# Patient Record
Sex: Female | Born: 1940 | Race: Black or African American | Hispanic: No | Marital: Single | Smoking: Never smoker
Health system: Southern US, Community
[De-identification: ages and names within clinical notes are randomized; demographics above are authoritative.]

## PROBLEM LIST (undated history)

## (undated) DIAGNOSIS — I10 Essential (primary) hypertension: Secondary | ICD-10-CM

## (undated) DIAGNOSIS — E079 Disorder of thyroid, unspecified: Secondary | ICD-10-CM

## (undated) DIAGNOSIS — R011 Cardiac murmur, unspecified: Secondary | ICD-10-CM

## (undated) DIAGNOSIS — E119 Type 2 diabetes mellitus without complications: Secondary | ICD-10-CM

## (undated) DIAGNOSIS — I2510 Atherosclerotic heart disease of native coronary artery without angina pectoris: Secondary | ICD-10-CM

## (undated) DIAGNOSIS — F329 Major depressive disorder, single episode, unspecified: Secondary | ICD-10-CM

## (undated) DIAGNOSIS — I509 Heart failure, unspecified: Secondary | ICD-10-CM

## (undated) DIAGNOSIS — F419 Anxiety disorder, unspecified: Secondary | ICD-10-CM

## (undated) DIAGNOSIS — G4730 Sleep apnea, unspecified: Secondary | ICD-10-CM

## (undated) DIAGNOSIS — E785 Hyperlipidemia, unspecified: Secondary | ICD-10-CM

## (undated) DIAGNOSIS — F32A Depression, unspecified: Secondary | ICD-10-CM

## (undated) DIAGNOSIS — T7840XA Allergy, unspecified, initial encounter: Secondary | ICD-10-CM

## (undated) DIAGNOSIS — K219 Gastro-esophageal reflux disease without esophagitis: Secondary | ICD-10-CM

## (undated) DIAGNOSIS — E039 Hypothyroidism, unspecified: Secondary | ICD-10-CM

## (undated) DIAGNOSIS — K635 Polyp of colon: Secondary | ICD-10-CM

## (undated) HISTORY — DX: Major depressive disorder, single episode, unspecified: F32.9

## (undated) HISTORY — DX: Hyperlipidemia, unspecified: E78.5

## (undated) HISTORY — DX: Polyp of colon: K63.5

## (undated) HISTORY — PX: CERVICAL FUSION: SHX112

## (undated) HISTORY — PX: ABDOMINAL HYSTERECTOMY: SHX81

## (undated) HISTORY — DX: Gastro-esophageal reflux disease without esophagitis: K21.9

## (undated) HISTORY — DX: Allergy, unspecified, initial encounter: T78.40XA

## (undated) HISTORY — DX: Depression, unspecified: F32.A

## (undated) HISTORY — DX: Anxiety disorder, unspecified: F41.9

## (undated) HISTORY — PX: BACK SURGERY: SHX140

## (undated) HISTORY — DX: Type 2 diabetes mellitus without complications: E11.9

## (undated) HISTORY — PX: NECK SURGERY: SHX720

## (undated) HISTORY — PX: THYROID SURGERY: SHX805

## (undated) HISTORY — DX: Heart failure, unspecified: I50.9

## (undated) HISTORY — DX: Sleep apnea, unspecified: G47.30

---

## 2003-06-16 ENCOUNTER — Other Ambulatory Visit: Payer: Self-pay

## 2004-01-01 ENCOUNTER — Ambulatory Visit: Payer: Self-pay | Admitting: Internal Medicine

## 2004-01-19 ENCOUNTER — Emergency Department: Payer: Self-pay | Admitting: Emergency Medicine

## 2004-01-24 ENCOUNTER — Ambulatory Visit: Payer: Self-pay | Admitting: Surgery

## 2004-01-31 ENCOUNTER — Ambulatory Visit: Payer: Self-pay | Admitting: Surgery

## 2004-02-27 ENCOUNTER — Ambulatory Visit: Payer: Self-pay | Admitting: Internal Medicine

## 2004-04-09 ENCOUNTER — Ambulatory Visit: Payer: Self-pay | Admitting: Otolaryngology

## 2004-06-28 ENCOUNTER — Emergency Department: Payer: Self-pay | Admitting: General Practice

## 2004-07-16 ENCOUNTER — Emergency Department: Payer: Self-pay | Admitting: General Practice

## 2004-07-17 ENCOUNTER — Ambulatory Visit: Payer: Self-pay | Admitting: Internal Medicine

## 2004-07-25 ENCOUNTER — Ambulatory Visit: Payer: Self-pay | Admitting: Internal Medicine

## 2004-09-29 ENCOUNTER — Ambulatory Visit: Payer: Self-pay | Admitting: Internal Medicine

## 2004-10-31 ENCOUNTER — Inpatient Hospital Stay: Payer: Self-pay | Admitting: Internal Medicine

## 2004-10-31 ENCOUNTER — Other Ambulatory Visit: Payer: Self-pay

## 2005-03-05 ENCOUNTER — Inpatient Hospital Stay: Payer: Self-pay | Admitting: Internal Medicine

## 2005-03-05 ENCOUNTER — Other Ambulatory Visit: Payer: Self-pay

## 2005-03-12 ENCOUNTER — Ambulatory Visit: Payer: Self-pay | Admitting: Internal Medicine

## 2005-03-27 ENCOUNTER — Ambulatory Visit: Payer: Self-pay | Admitting: Internal Medicine

## 2005-09-09 ENCOUNTER — Ambulatory Visit: Payer: Self-pay | Admitting: Gastroenterology

## 2005-10-13 ENCOUNTER — Ambulatory Visit: Payer: Self-pay | Admitting: Gastroenterology

## 2005-11-12 ENCOUNTER — Emergency Department: Payer: Self-pay | Admitting: Emergency Medicine

## 2005-11-16 ENCOUNTER — Other Ambulatory Visit: Payer: Self-pay

## 2005-11-16 ENCOUNTER — Emergency Department: Payer: Self-pay | Admitting: Emergency Medicine

## 2005-12-03 ENCOUNTER — Ambulatory Visit: Payer: Self-pay | Admitting: Internal Medicine

## 2006-06-20 ENCOUNTER — Emergency Department: Payer: Self-pay | Admitting: Emergency Medicine

## 2006-07-02 ENCOUNTER — Emergency Department: Payer: Self-pay | Admitting: Emergency Medicine

## 2006-10-05 ENCOUNTER — Emergency Department: Payer: Self-pay | Admitting: Emergency Medicine

## 2006-10-05 ENCOUNTER — Other Ambulatory Visit: Payer: Self-pay

## 2006-11-02 ENCOUNTER — Ambulatory Visit: Payer: Self-pay | Admitting: Internal Medicine

## 2006-11-16 ENCOUNTER — Emergency Department: Payer: Self-pay | Admitting: Emergency Medicine

## 2006-11-16 ENCOUNTER — Other Ambulatory Visit: Payer: Self-pay

## 2007-03-15 ENCOUNTER — Ambulatory Visit: Payer: Self-pay | Admitting: Internal Medicine

## 2007-06-09 ENCOUNTER — Emergency Department: Payer: Self-pay | Admitting: Emergency Medicine

## 2007-06-09 ENCOUNTER — Other Ambulatory Visit: Payer: Self-pay

## 2007-07-09 ENCOUNTER — Emergency Department: Payer: Self-pay | Admitting: Emergency Medicine

## 2007-07-29 ENCOUNTER — Ambulatory Visit: Payer: Self-pay | Admitting: Internal Medicine

## 2007-08-19 ENCOUNTER — Ambulatory Visit: Payer: Self-pay | Admitting: Internal Medicine

## 2007-08-29 ENCOUNTER — Ambulatory Visit: Payer: Self-pay | Admitting: Internal Medicine

## 2007-10-03 ENCOUNTER — Ambulatory Visit: Payer: Self-pay | Admitting: Internal Medicine

## 2007-10-14 ENCOUNTER — Emergency Department: Payer: Self-pay | Admitting: Emergency Medicine

## 2007-11-16 ENCOUNTER — Ambulatory Visit: Payer: Self-pay | Admitting: Internal Medicine

## 2007-12-15 ENCOUNTER — Ambulatory Visit: Payer: Self-pay | Admitting: Internal Medicine

## 2008-02-15 ENCOUNTER — Ambulatory Visit: Payer: Self-pay | Admitting: Internal Medicine

## 2008-03-09 ENCOUNTER — Emergency Department: Payer: Self-pay | Admitting: Emergency Medicine

## 2008-03-15 ENCOUNTER — Ambulatory Visit: Payer: Self-pay | Admitting: Internal Medicine

## 2008-04-18 ENCOUNTER — Ambulatory Visit: Payer: Self-pay | Admitting: Internal Medicine

## 2008-05-14 ENCOUNTER — Ambulatory Visit: Payer: Self-pay | Admitting: General Surgery

## 2008-06-05 ENCOUNTER — Emergency Department: Payer: Self-pay | Admitting: Emergency Medicine

## 2008-06-10 ENCOUNTER — Emergency Department: Payer: Self-pay | Admitting: Emergency Medicine

## 2008-06-11 ENCOUNTER — Ambulatory Visit: Payer: Self-pay | Admitting: Oncology

## 2008-06-14 LAB — CBC WITH DIFFERENTIAL (CANCER CENTER ONLY)
BASO#: 0.1 10*3/uL (ref 0.0–0.2)
BASO%: 1.3 % (ref 0.0–2.0)
EOS%: 2.3 % (ref 0.0–7.0)
Eosinophils Absolute: 0.1 10*3/uL (ref 0.0–0.5)
HCT: 34.8 % (ref 34.8–46.6)
HGB: 11.4 g/dL — ABNORMAL LOW (ref 11.6–15.9)
LYMPH#: 2.5 10*3/uL (ref 0.9–3.3)
LYMPH%: 46.6 % (ref 14.0–48.0)
MCH: 25.1 pg — ABNORMAL LOW (ref 26.0–34.0)
MCHC: 32.8 g/dL (ref 32.0–36.0)
MCV: 76 fL — ABNORMAL LOW (ref 81–101)
MONO#: 0.3 10*3/uL (ref 0.1–0.9)
MONO%: 6.2 % (ref 0.0–13.0)
NEUT#: 2.3 10*3/uL (ref 1.5–6.5)
NEUT%: 43.6 % (ref 39.6–80.0)
Platelets: 223 10*3/uL (ref 145–400)
RBC: 4.55 10*6/uL (ref 3.70–5.32)
RDW: 13.6 % (ref 10.5–14.6)
WBC: 5.3 10*3/uL (ref 3.9–10.0)

## 2008-06-14 LAB — CMP (CANCER CENTER ONLY)
ALT(SGPT): 30 U/L (ref 10–47)
AST: 33 U/L (ref 11–38)
Albumin: 3.5 g/dL (ref 3.3–5.5)
Alkaline Phosphatase: 114 U/L — ABNORMAL HIGH (ref 26–84)
BUN, Bld: 20 mg/dL (ref 7–22)
CO2: 30 mEq/L (ref 18–33)
Calcium: 9.2 mg/dL (ref 8.0–10.3)
Chloride: 98 mEq/L (ref 98–108)
Creat: 1.2 mg/dl (ref 0.6–1.2)
Glucose, Bld: 91 mg/dL (ref 73–118)
Potassium: 3.4 mEq/L (ref 3.3–4.7)
Sodium: 137 mEq/L (ref 128–145)
Total Bilirubin: 0.6 mg/dl (ref 0.20–1.60)
Total Protein: 7.8 g/dL (ref 6.4–8.1)

## 2008-06-14 LAB — LACTATE DEHYDROGENASE: LDH: 186 U/L (ref 94–250)

## 2008-06-19 LAB — RETICULOCYTES (CHCC)
ABS Retic: 67.5 10*3/uL (ref 19.0–186.0)
RBC.: 4.22 MIL/uL (ref 3.87–5.11)
Retic Ct Pct: 1.6 % (ref 0.4–3.1)

## 2008-06-19 LAB — PROTEIN ELECTROPHORESIS, SERUM
Albumin ELP: 55.2 % — ABNORMAL LOW (ref 55.8–66.1)
Alpha-1-Globulin: 4.7 % (ref 2.9–4.9)
Alpha-2-Globulin: 9.4 % (ref 7.1–11.8)
Beta 2: 6.9 % — ABNORMAL HIGH (ref 3.2–6.5)
Beta Globulin: 6 % (ref 4.7–7.2)
Gamma Globulin: 17.8 % (ref 11.1–18.8)
Total Protein, Serum Electrophoresis: 7.2 g/dL (ref 6.0–8.3)

## 2008-06-19 LAB — FERRITIN: Ferritin: 225 ng/mL (ref 10–291)

## 2008-06-19 LAB — HEMOGLOBINOPATHY EVALUATION
Hemoglobin Other: 0 % (ref 0.0–0.0)
Hgb A2 Quant: 2.4 % (ref 2.2–3.2)
Hgb A: 97.6 % (ref 96.8–97.8)
Hgb F Quant: 0 % (ref 0.0–2.0)
Hgb S Quant: 0 % (ref 0.0–0.0)

## 2008-06-19 LAB — IRON AND TIBC
%SAT: 42 % (ref 20–55)
Iron: 115 ug/dL (ref 42–145)
TIBC: 277 ug/dL (ref 250–470)
UIBC: 162 ug/dL

## 2008-06-19 LAB — FOLATE: Folate: >20 ng/mL

## 2008-06-19 LAB — VITAMIN B12: Vitamin B-12: 454 pg/mL (ref 211–911)

## 2008-07-31 ENCOUNTER — Ambulatory Visit: Payer: Self-pay | Admitting: Oncology

## 2008-08-02 LAB — CBC WITH DIFFERENTIAL (CANCER CENTER ONLY)
HCT: 34.6 % — ABNORMAL LOW (ref 34.8–46.6)
HGB: 11.6 g/dL (ref 11.6–15.9)
MCH: 25.4 pg — ABNORMAL LOW (ref 26.0–34.0)
MCHC: 33.4 g/dL (ref 32.0–36.0)
MCV: 76 fL — ABNORMAL LOW (ref 81–101)
Platelets: 172 10*3/uL (ref 145–400)
RBC: 4.56 10*6/uL (ref 3.70–5.32)
RDW: 13.8 % (ref 10.5–14.6)
WBC: 5.1 10*3/uL (ref 3.9–10.0)

## 2008-08-02 LAB — MANUAL DIFFERENTIAL (CHCC SATELLITE)
ALC: 3.1 10*3/uL — ABNORMAL HIGH (ref 0.6–2.2)
ANC (CHCC HP manual diff): 1.5 10*3/uL (ref 1.5–6.7)
Band Neutrophils: 2 % (ref 0–10)
Blasts: 2 % — ABNORMAL HIGH (ref 0–0)
Eos: 3 % (ref 0–7)
LYMPH: 55 % — ABNORMAL HIGH (ref 14–48)
MONO: 4 % (ref 0–13)
PLT EST ~~LOC~~: ADEQUATE
SEG: 28 % — ABNORMAL LOW (ref 40–75)
Variant Lymph: 6 % — ABNORMAL HIGH (ref 0–0)

## 2008-09-05 ENCOUNTER — Ambulatory Visit: Payer: Self-pay | Admitting: Gastroenterology

## 2008-10-30 ENCOUNTER — Encounter: Payer: Self-pay | Admitting: Internal Medicine

## 2008-11-28 ENCOUNTER — Encounter: Payer: Self-pay | Admitting: Internal Medicine

## 2008-12-12 ENCOUNTER — Emergency Department: Payer: Self-pay | Admitting: Emergency Medicine

## 2008-12-31 ENCOUNTER — Observation Stay: Payer: Self-pay | Admitting: Cardiovascular Disease

## 2009-01-21 ENCOUNTER — Emergency Department: Payer: Self-pay | Admitting: Emergency Medicine

## 2009-02-24 ENCOUNTER — Emergency Department: Payer: Self-pay | Admitting: Internal Medicine

## 2009-03-20 ENCOUNTER — Ambulatory Visit: Payer: Self-pay | Admitting: Internal Medicine

## 2009-03-25 ENCOUNTER — Ambulatory Visit: Payer: Self-pay | Admitting: Internal Medicine

## 2009-03-30 ENCOUNTER — Emergency Department: Payer: Self-pay | Admitting: Internal Medicine

## 2009-04-16 ENCOUNTER — Ambulatory Visit: Payer: Self-pay | Admitting: Cardiovascular Disease

## 2009-07-31 ENCOUNTER — Encounter: Payer: Self-pay | Admitting: Neurosurgery

## 2009-08-27 ENCOUNTER — Ambulatory Visit: Payer: Self-pay | Admitting: Internal Medicine

## 2009-08-28 ENCOUNTER — Encounter: Payer: Self-pay | Admitting: Neurosurgery

## 2009-09-10 ENCOUNTER — Observation Stay: Payer: Self-pay | Admitting: Internal Medicine

## 2009-10-08 ENCOUNTER — Ambulatory Visit: Payer: Self-pay

## 2010-01-22 ENCOUNTER — Ambulatory Visit: Payer: Self-pay | Admitting: Cardiovascular Disease

## 2010-03-25 ENCOUNTER — Ambulatory Visit: Payer: Self-pay | Admitting: Internal Medicine

## 2010-05-29 ENCOUNTER — Emergency Department: Payer: Self-pay | Admitting: Internal Medicine

## 2010-09-18 ENCOUNTER — Ambulatory Visit: Payer: Self-pay | Admitting: Internal Medicine

## 2010-10-15 ENCOUNTER — Emergency Department: Payer: Self-pay | Admitting: Emergency Medicine

## 2010-10-16 ENCOUNTER — Emergency Department: Payer: Self-pay | Admitting: Emergency Medicine

## 2010-11-18 ENCOUNTER — Emergency Department: Payer: Self-pay | Admitting: Emergency Medicine

## 2011-05-21 ENCOUNTER — Emergency Department: Payer: Self-pay | Admitting: Emergency Medicine

## 2011-06-05 ENCOUNTER — Ambulatory Visit: Payer: Self-pay | Admitting: Internal Medicine

## 2011-09-11 ENCOUNTER — Emergency Department: Payer: Self-pay | Admitting: Emergency Medicine

## 2011-11-07 ENCOUNTER — Ambulatory Visit: Payer: Self-pay | Admitting: Specialist

## 2012-02-09 ENCOUNTER — Ambulatory Visit: Payer: Self-pay | Admitting: Physician Assistant

## 2012-06-17 ENCOUNTER — Ambulatory Visit: Payer: Self-pay | Admitting: Physician Assistant

## 2012-07-17 ENCOUNTER — Emergency Department: Payer: Self-pay | Admitting: Unknown Physician Specialty

## 2012-07-17 LAB — BASIC METABOLIC PANEL
Anion Gap: 6 — ABNORMAL LOW (ref 7–16)
BUN: 11 mg/dL (ref 7–18)
Calcium, Total: 8.9 mg/dL (ref 8.5–10.1)
Chloride: 105 mmol/L (ref 98–107)
Co2: 29 mmol/L (ref 21–32)
Creatinine: 0.74 mg/dL (ref 0.60–1.30)
EGFR (African American): >60
EGFR (Non-African Amer.): >60
Glucose: 119 mg/dL — ABNORMAL HIGH (ref 65–99)
Osmolality: 280 (ref 275–301)
Potassium: 3.5 mmol/L (ref 3.5–5.1)
Sodium: 140 mmol/L (ref 136–145)

## 2012-07-17 LAB — HEPATIC FUNCTION PANEL A (ARMC)
Albumin: 3.5 g/dL (ref 3.4–5.0)
Alkaline Phosphatase: 205 U/L — ABNORMAL HIGH (ref 50–136)
Bilirubin, Direct: <0.1 mg/dL (ref 0.00–0.20)
Bilirubin,Total: 0.3 mg/dL (ref 0.2–1.0)
SGOT(AST): 107 U/L — ABNORMAL HIGH (ref 15–37)
SGPT (ALT): 139 U/L — ABNORMAL HIGH (ref 12–78)
Total Protein: 7.7 g/dL (ref 6.4–8.2)

## 2012-07-17 LAB — CK TOTAL AND CKMB (NOT AT ARMC)
CK, Total: 128 U/L (ref 21–215)
CK-MB: 1.8 ng/mL (ref 0.5–3.6)

## 2012-07-17 LAB — CBC
HCT: 34.8 % — ABNORMAL LOW (ref 35.0–47.0)
HGB: 11.3 g/dL — ABNORMAL LOW (ref 12.0–16.0)
MCH: 25.2 pg — ABNORMAL LOW (ref 26.0–34.0)
MCHC: 32.5 g/dL (ref 32.0–36.0)
MCV: 77 fL — ABNORMAL LOW (ref 80–100)
Platelet: 186 10*3/uL (ref 150–440)
RBC: 4.49 10*6/uL (ref 3.80–5.20)
RDW: 15.2 % — ABNORMAL HIGH (ref 11.5–14.5)
WBC: 7.3 10*3/uL (ref 3.6–11.0)

## 2012-07-17 LAB — LIPASE, BLOOD: Lipase: 99 U/L (ref 73–393)

## 2012-07-17 LAB — TROPONIN I: Troponin-I: <0.02 ng/mL

## 2012-10-27 ENCOUNTER — Ambulatory Visit: Payer: Self-pay | Admitting: Oncology

## 2012-10-27 LAB — CBC CANCER CENTER
Basophil #: 0 x10 3/mm (ref 0.0–0.1)
Basophil %: 0.6 %
Eosinophil #: 0.2 x10 3/mm (ref 0.0–0.7)
Eosinophil %: 3.4 %
HCT: 33.4 % — ABNORMAL LOW (ref 35.0–47.0)
HGB: 11 g/dL — ABNORMAL LOW (ref 12.0–16.0)
Lymphocyte %: 50.5 %
Lymphs Abs: 2.9 x10 3/mm (ref 1.0–3.6)
MCH: 25.6 pg — ABNORMAL LOW (ref 26.0–34.0)
MCHC: 32.9 g/dL (ref 32.0–36.0)
MCV: 78 fL — ABNORMAL LOW (ref 80–100)
Monocyte #: 0.4 x10 3/mm (ref 0.2–0.9)
Monocyte %: 7.7 %
Neutrophil #: 2.2 x10 3/mm (ref 1.4–6.5)
Neutrophil %: 37.8 %
Platelet: 197 x10 3/mm (ref 150–440)
RBC: 4.3 10*6/uL (ref 3.80–5.20)
RDW: 15 % — ABNORMAL HIGH (ref 11.5–14.5)
WBC: 5.7 x10 3/mm (ref 3.6–11.0)

## 2012-10-27 LAB — LACTATE DEHYDROGENASE: LDH: 207 U/L (ref 81–246)

## 2012-10-28 ENCOUNTER — Ambulatory Visit: Payer: Self-pay | Admitting: Oncology

## 2012-10-31 LAB — PROT IMMUNOELECTROPHORES(ARMC)

## 2012-12-02 ENCOUNTER — Ambulatory Visit: Payer: Self-pay | Admitting: Oncology

## 2012-12-14 ENCOUNTER — Ambulatory Visit: Payer: Self-pay | Admitting: Physician Assistant

## 2013-01-12 ENCOUNTER — Emergency Department: Payer: Self-pay | Admitting: Emergency Medicine

## 2013-03-01 ENCOUNTER — Ambulatory Visit: Payer: Self-pay | Admitting: Physician Assistant

## 2013-03-30 DIAGNOSIS — M5412 Radiculopathy, cervical region: Secondary | ICD-10-CM

## 2013-03-30 DIAGNOSIS — M5030 Other cervical disc degeneration, unspecified cervical region: Secondary | ICD-10-CM

## 2013-03-30 HISTORY — DX: Radiculopathy, cervical region: M54.12

## 2013-03-30 HISTORY — DX: Other cervical disc degeneration, unspecified cervical region: M50.30

## 2013-04-10 ENCOUNTER — Ambulatory Visit: Payer: Self-pay | Admitting: Orthopedic Surgery

## 2013-06-22 ENCOUNTER — Ambulatory Visit: Payer: Self-pay | Admitting: Physician Assistant

## 2013-07-20 ENCOUNTER — Ambulatory Visit: Payer: Self-pay | Admitting: Physician Assistant

## 2013-07-29 ENCOUNTER — Emergency Department: Payer: Self-pay | Admitting: Emergency Medicine

## 2013-12-14 DIAGNOSIS — M25512 Pain in left shoulder: Secondary | ICD-10-CM | POA: Insufficient documentation

## 2013-12-14 DIAGNOSIS — M5030 Other cervical disc degeneration, unspecified cervical region: Secondary | ICD-10-CM | POA: Insufficient documentation

## 2013-12-14 DIAGNOSIS — M5412 Radiculopathy, cervical region: Secondary | ICD-10-CM | POA: Insufficient documentation

## 2013-12-22 ENCOUNTER — Ambulatory Visit: Payer: Self-pay | Admitting: Physical Medicine and Rehabilitation

## 2014-04-10 DIAGNOSIS — E119 Type 2 diabetes mellitus without complications: Secondary | ICD-10-CM | POA: Diagnosis not present

## 2014-04-10 DIAGNOSIS — K219 Gastro-esophageal reflux disease without esophagitis: Secondary | ICD-10-CM | POA: Diagnosis not present

## 2014-04-10 DIAGNOSIS — D509 Iron deficiency anemia, unspecified: Secondary | ICD-10-CM | POA: Diagnosis not present

## 2014-04-10 DIAGNOSIS — D649 Anemia, unspecified: Secondary | ICD-10-CM | POA: Diagnosis not present

## 2014-04-10 DIAGNOSIS — E039 Hypothyroidism, unspecified: Secondary | ICD-10-CM | POA: Diagnosis not present

## 2014-04-10 DIAGNOSIS — R109 Unspecified abdominal pain: Secondary | ICD-10-CM | POA: Diagnosis not present

## 2014-04-10 DIAGNOSIS — E559 Vitamin D deficiency, unspecified: Secondary | ICD-10-CM | POA: Diagnosis not present

## 2014-04-10 DIAGNOSIS — I10 Essential (primary) hypertension: Secondary | ICD-10-CM | POA: Diagnosis not present

## 2014-04-10 DIAGNOSIS — K5900 Constipation, unspecified: Secondary | ICD-10-CM | POA: Diagnosis not present

## 2014-04-11 ENCOUNTER — Other Ambulatory Visit: Payer: Self-pay | Admitting: Neurosurgery

## 2014-04-11 DIAGNOSIS — M5416 Radiculopathy, lumbar region: Secondary | ICD-10-CM

## 2014-04-17 DIAGNOSIS — E039 Hypothyroidism, unspecified: Secondary | ICD-10-CM | POA: Diagnosis not present

## 2014-04-17 DIAGNOSIS — K219 Gastro-esophageal reflux disease without esophagitis: Secondary | ICD-10-CM | POA: Diagnosis not present

## 2014-04-17 DIAGNOSIS — D509 Iron deficiency anemia, unspecified: Secondary | ICD-10-CM | POA: Diagnosis not present

## 2014-04-17 DIAGNOSIS — E2839 Other primary ovarian failure: Secondary | ICD-10-CM | POA: Diagnosis not present

## 2014-04-17 DIAGNOSIS — I10 Essential (primary) hypertension: Secondary | ICD-10-CM | POA: Diagnosis not present

## 2014-04-24 ENCOUNTER — Other Ambulatory Visit: Payer: Self-pay

## 2014-04-24 DIAGNOSIS — E870 Hyperosmolality and hypernatremia: Secondary | ICD-10-CM | POA: Diagnosis not present

## 2014-05-02 DIAGNOSIS — G4733 Obstructive sleep apnea (adult) (pediatric): Secondary | ICD-10-CM | POA: Diagnosis not present

## 2014-05-10 DIAGNOSIS — G4733 Obstructive sleep apnea (adult) (pediatric): Secondary | ICD-10-CM | POA: Diagnosis not present

## 2014-05-15 DIAGNOSIS — G4733 Obstructive sleep apnea (adult) (pediatric): Secondary | ICD-10-CM | POA: Diagnosis not present

## 2014-05-18 ENCOUNTER — Emergency Department: Payer: Self-pay | Admitting: Emergency Medicine

## 2014-05-18 DIAGNOSIS — Z79899 Other long term (current) drug therapy: Secondary | ICD-10-CM | POA: Diagnosis not present

## 2014-05-18 DIAGNOSIS — J9811 Atelectasis: Secondary | ICD-10-CM | POA: Diagnosis not present

## 2014-05-18 DIAGNOSIS — K219 Gastro-esophageal reflux disease without esophagitis: Secondary | ICD-10-CM | POA: Diagnosis not present

## 2014-05-18 DIAGNOSIS — Z7951 Long term (current) use of inhaled steroids: Secondary | ICD-10-CM | POA: Diagnosis not present

## 2014-05-18 DIAGNOSIS — I10 Essential (primary) hypertension: Secondary | ICD-10-CM | POA: Diagnosis not present

## 2014-05-22 DIAGNOSIS — I10 Essential (primary) hypertension: Secondary | ICD-10-CM | POA: Diagnosis not present

## 2014-05-22 DIAGNOSIS — F330 Major depressive disorder, recurrent, mild: Secondary | ICD-10-CM | POA: Diagnosis not present

## 2014-05-22 DIAGNOSIS — K219 Gastro-esophageal reflux disease without esophagitis: Secondary | ICD-10-CM | POA: Diagnosis not present

## 2014-05-22 DIAGNOSIS — R10816 Epigastric abdominal tenderness: Secondary | ICD-10-CM | POA: Diagnosis not present

## 2014-06-04 DIAGNOSIS — D123 Benign neoplasm of transverse colon: Secondary | ICD-10-CM | POA: Diagnosis not present

## 2014-06-05 ENCOUNTER — Ambulatory Visit: Payer: Self-pay | Admitting: Gastroenterology

## 2014-06-05 DIAGNOSIS — Z86010 Personal history of colonic polyps: Secondary | ICD-10-CM | POA: Diagnosis not present

## 2014-06-05 DIAGNOSIS — Z7982 Long term (current) use of aspirin: Secondary | ICD-10-CM | POA: Diagnosis not present

## 2014-06-05 DIAGNOSIS — Z79899 Other long term (current) drug therapy: Secondary | ICD-10-CM | POA: Diagnosis not present

## 2014-06-05 DIAGNOSIS — F329 Major depressive disorder, single episode, unspecified: Secondary | ICD-10-CM | POA: Diagnosis not present

## 2014-06-05 DIAGNOSIS — I2510 Atherosclerotic heart disease of native coronary artery without angina pectoris: Secondary | ICD-10-CM | POA: Diagnosis not present

## 2014-06-05 DIAGNOSIS — G4730 Sleep apnea, unspecified: Secondary | ICD-10-CM | POA: Diagnosis not present

## 2014-06-05 DIAGNOSIS — E039 Hypothyroidism, unspecified: Secondary | ICD-10-CM | POA: Diagnosis not present

## 2014-06-05 DIAGNOSIS — K648 Other hemorrhoids: Secondary | ICD-10-CM | POA: Diagnosis not present

## 2014-06-05 DIAGNOSIS — I10 Essential (primary) hypertension: Secondary | ICD-10-CM | POA: Diagnosis not present

## 2014-06-05 DIAGNOSIS — J45909 Unspecified asthma, uncomplicated: Secondary | ICD-10-CM | POA: Diagnosis not present

## 2014-06-05 DIAGNOSIS — Z1211 Encounter for screening for malignant neoplasm of colon: Secondary | ICD-10-CM | POA: Diagnosis not present

## 2014-06-05 DIAGNOSIS — D649 Anemia, unspecified: Secondary | ICD-10-CM | POA: Diagnosis not present

## 2014-06-05 DIAGNOSIS — D123 Benign neoplasm of transverse colon: Secondary | ICD-10-CM | POA: Diagnosis not present

## 2014-06-20 ENCOUNTER — Emergency Department: Payer: Self-pay | Admitting: Emergency Medicine

## 2014-06-20 DIAGNOSIS — L739 Follicular disorder, unspecified: Secondary | ICD-10-CM | POA: Diagnosis not present

## 2014-06-20 DIAGNOSIS — Z79899 Other long term (current) drug therapy: Secondary | ICD-10-CM | POA: Diagnosis not present

## 2014-06-20 DIAGNOSIS — Z7951 Long term (current) use of inhaled steroids: Secondary | ICD-10-CM | POA: Diagnosis not present

## 2014-06-20 DIAGNOSIS — I10 Essential (primary) hypertension: Secondary | ICD-10-CM | POA: Diagnosis not present

## 2014-06-25 ENCOUNTER — Ambulatory Visit: Payer: Self-pay | Admitting: Physician Assistant

## 2014-06-25 DIAGNOSIS — Z1231 Encounter for screening mammogram for malignant neoplasm of breast: Secondary | ICD-10-CM | POA: Diagnosis not present

## 2014-06-27 DIAGNOSIS — I10 Essential (primary) hypertension: Secondary | ICD-10-CM | POA: Diagnosis not present

## 2014-06-27 DIAGNOSIS — L739 Follicular disorder, unspecified: Secondary | ICD-10-CM | POA: Diagnosis not present

## 2014-06-27 DIAGNOSIS — L409 Psoriasis, unspecified: Secondary | ICD-10-CM | POA: Diagnosis not present

## 2014-06-27 DIAGNOSIS — F330 Major depressive disorder, recurrent, mild: Secondary | ICD-10-CM | POA: Diagnosis not present

## 2014-06-27 DIAGNOSIS — G4733 Obstructive sleep apnea (adult) (pediatric): Secondary | ICD-10-CM | POA: Diagnosis not present

## 2014-07-06 ENCOUNTER — Emergency Department
Admit: 2014-07-06 | Disposition: A | Discharge status: Home / Self Care | Payer: Self-pay | Admitting: Emergency Medicine

## 2014-07-06 DIAGNOSIS — S52502A Unspecified fracture of the lower end of left radius, initial encounter for closed fracture: Secondary | ICD-10-CM | POA: Diagnosis not present

## 2014-07-06 DIAGNOSIS — I10 Essential (primary) hypertension: Secondary | ICD-10-CM | POA: Diagnosis not present

## 2014-07-06 DIAGNOSIS — S52592A Other fractures of lower end of left radius, initial encounter for closed fracture: Secondary | ICD-10-CM | POA: Diagnosis not present

## 2014-07-12 DIAGNOSIS — S52522A Torus fracture of lower end of left radius, initial encounter for closed fracture: Secondary | ICD-10-CM | POA: Diagnosis not present

## 2014-07-16 DIAGNOSIS — E039 Hypothyroidism, unspecified: Secondary | ICD-10-CM | POA: Diagnosis not present

## 2014-07-16 DIAGNOSIS — R51 Headache: Secondary | ICD-10-CM | POA: Diagnosis not present

## 2014-07-16 DIAGNOSIS — L739 Follicular disorder, unspecified: Secondary | ICD-10-CM | POA: Diagnosis not present

## 2014-07-16 DIAGNOSIS — L409 Psoriasis, unspecified: Secondary | ICD-10-CM | POA: Diagnosis not present

## 2014-07-16 DIAGNOSIS — I10 Essential (primary) hypertension: Secondary | ICD-10-CM | POA: Diagnosis not present

## 2014-07-18 DIAGNOSIS — M064 Inflammatory polyarthropathy: Secondary | ICD-10-CM | POA: Diagnosis not present

## 2014-07-18 DIAGNOSIS — E782 Mixed hyperlipidemia: Secondary | ICD-10-CM | POA: Diagnosis not present

## 2014-07-18 DIAGNOSIS — E559 Vitamin D deficiency, unspecified: Secondary | ICD-10-CM | POA: Diagnosis not present

## 2014-07-18 DIAGNOSIS — E039 Hypothyroidism, unspecified: Secondary | ICD-10-CM | POA: Diagnosis not present

## 2014-07-18 DIAGNOSIS — I10 Essential (primary) hypertension: Secondary | ICD-10-CM | POA: Diagnosis not present

## 2014-07-18 DIAGNOSIS — H2513 Age-related nuclear cataract, bilateral: Secondary | ICD-10-CM | POA: Diagnosis not present

## 2014-07-23 LAB — SURGICAL PATHOLOGY

## 2014-07-25 ENCOUNTER — Encounter: Payer: Self-pay | Admitting: Cardiology

## 2014-08-02 DIAGNOSIS — S52522D Torus fracture of lower end of left radius, subsequent encounter for fracture with routine healing: Secondary | ICD-10-CM | POA: Diagnosis not present

## 2014-08-13 DIAGNOSIS — B350 Tinea barbae and tinea capitis: Secondary | ICD-10-CM | POA: Diagnosis not present

## 2014-08-24 DIAGNOSIS — E119 Type 2 diabetes mellitus without complications: Secondary | ICD-10-CM | POA: Diagnosis not present

## 2014-08-24 DIAGNOSIS — B351 Tinea unguium: Secondary | ICD-10-CM | POA: Diagnosis not present

## 2014-08-24 DIAGNOSIS — K5900 Constipation, unspecified: Secondary | ICD-10-CM | POA: Diagnosis not present

## 2014-08-24 DIAGNOSIS — F330 Major depressive disorder, recurrent, mild: Secondary | ICD-10-CM | POA: Diagnosis not present

## 2014-10-23 DIAGNOSIS — I10 Essential (primary) hypertension: Secondary | ICD-10-CM | POA: Diagnosis not present

## 2014-10-23 DIAGNOSIS — E119 Type 2 diabetes mellitus without complications: Secondary | ICD-10-CM | POA: Diagnosis not present

## 2014-10-23 DIAGNOSIS — M5030 Other cervical disc degeneration, unspecified cervical region: Secondary | ICD-10-CM | POA: Diagnosis not present

## 2014-10-23 DIAGNOSIS — M5137 Other intervertebral disc degeneration, lumbosacral region: Secondary | ICD-10-CM | POA: Diagnosis not present

## 2014-10-30 DIAGNOSIS — M75122 Complete rotator cuff tear or rupture of left shoulder, not specified as traumatic: Secondary | ICD-10-CM | POA: Diagnosis not present

## 2014-10-30 DIAGNOSIS — M7062 Trochanteric bursitis, left hip: Secondary | ICD-10-CM | POA: Diagnosis not present

## 2014-10-30 DIAGNOSIS — M5126 Other intervertebral disc displacement, lumbar region: Secondary | ICD-10-CM | POA: Diagnosis not present

## 2014-10-30 DIAGNOSIS — M5416 Radiculopathy, lumbar region: Secondary | ICD-10-CM | POA: Diagnosis not present

## 2014-12-06 DIAGNOSIS — H524 Presbyopia: Secondary | ICD-10-CM | POA: Diagnosis not present

## 2014-12-06 DIAGNOSIS — F524 Premature ejaculation: Secondary | ICD-10-CM | POA: Diagnosis not present

## 2014-12-06 DIAGNOSIS — H2513 Age-related nuclear cataract, bilateral: Secondary | ICD-10-CM | POA: Diagnosis not present

## 2014-12-06 DIAGNOSIS — H3531 Nonexudative age-related macular degeneration: Secondary | ICD-10-CM | POA: Diagnosis not present

## 2014-12-13 DIAGNOSIS — E039 Hypothyroidism, unspecified: Secondary | ICD-10-CM | POA: Diagnosis not present

## 2014-12-13 DIAGNOSIS — J309 Allergic rhinitis, unspecified: Secondary | ICD-10-CM | POA: Diagnosis not present

## 2014-12-13 DIAGNOSIS — I10 Essential (primary) hypertension: Secondary | ICD-10-CM | POA: Diagnosis not present

## 2014-12-13 DIAGNOSIS — J069 Acute upper respiratory infection, unspecified: Secondary | ICD-10-CM | POA: Diagnosis not present

## 2014-12-21 DIAGNOSIS — J4520 Mild intermittent asthma, uncomplicated: Secondary | ICD-10-CM | POA: Diagnosis not present

## 2014-12-21 DIAGNOSIS — J3089 Other allergic rhinitis: Secondary | ICD-10-CM | POA: Diagnosis not present

## 2014-12-21 DIAGNOSIS — R05 Cough: Secondary | ICD-10-CM | POA: Diagnosis not present

## 2014-12-21 DIAGNOSIS — J301 Allergic rhinitis due to pollen: Secondary | ICD-10-CM | POA: Diagnosis not present

## 2014-12-24 DIAGNOSIS — B350 Tinea barbae and tinea capitis: Secondary | ICD-10-CM | POA: Diagnosis not present

## 2014-12-25 DIAGNOSIS — J3089 Other allergic rhinitis: Secondary | ICD-10-CM | POA: Diagnosis not present

## 2014-12-25 DIAGNOSIS — J3081 Allergic rhinitis due to animal (cat) (dog) hair and dander: Secondary | ICD-10-CM | POA: Diagnosis not present

## 2014-12-25 DIAGNOSIS — J301 Allergic rhinitis due to pollen: Secondary | ICD-10-CM | POA: Diagnosis not present

## 2015-01-02 ENCOUNTER — Encounter: Payer: Self-pay | Admitting: *Deleted

## 2015-01-02 DIAGNOSIS — R109 Unspecified abdominal pain: Secondary | ICD-10-CM | POA: Insufficient documentation

## 2015-01-02 DIAGNOSIS — R350 Frequency of micturition: Secondary | ICD-10-CM | POA: Diagnosis not present

## 2015-01-02 DIAGNOSIS — R1032 Left lower quadrant pain: Secondary | ICD-10-CM | POA: Diagnosis not present

## 2015-01-02 DIAGNOSIS — I10 Essential (primary) hypertension: Secondary | ICD-10-CM | POA: Insufficient documentation

## 2015-01-02 LAB — URINALYSIS COMPLETE WITH MICROSCOPIC (ARMC ONLY)
Bacteria, UA: NONE SEEN
Bilirubin Urine: NEGATIVE
Glucose, UA: NEGATIVE mg/dL
Hgb urine dipstick: NEGATIVE
Ketones, ur: NEGATIVE mg/dL
Leukocytes, UA: NEGATIVE
Nitrite: NEGATIVE
Protein, ur: NEGATIVE mg/dL
Specific Gravity, Urine: 1.028 (ref 1.005–1.030)
pH: 5 (ref 5.0–8.0)

## 2015-01-02 NOTE — ED Notes (Signed)
Pt reports left sided flank pain x 2 weeks and urinary frequency x 1 week. Pt denies nausea or vomiting.

## 2015-01-03 ENCOUNTER — Emergency Department
Admission: EM | Admit: 2015-01-03 | Discharge: 2015-01-03 | Disposition: A | Discharge status: Home / Self Care | Payer: Medicare Other | Attending: Emergency Medicine | Admitting: Emergency Medicine

## 2015-01-03 ENCOUNTER — Emergency Department: Payer: Medicare Other

## 2015-01-03 DIAGNOSIS — R109 Unspecified abdominal pain: Secondary | ICD-10-CM | POA: Diagnosis not present

## 2015-01-03 HISTORY — DX: Cardiac murmur, unspecified: R01.1

## 2015-01-03 HISTORY — DX: Essential (primary) hypertension: I10

## 2015-01-03 HISTORY — DX: Disorder of thyroid, unspecified: E07.9

## 2015-01-03 LAB — BASIC METABOLIC PANEL
Anion gap: 3 — ABNORMAL LOW (ref 5–15)
BUN: 22 mg/dL — ABNORMAL HIGH (ref 6–20)
CO2: 30 mmol/L (ref 22–32)
Calcium: 8.9 mg/dL (ref 8.9–10.3)
Chloride: 108 mmol/L (ref 101–111)
Creatinine, Ser: 0.87 mg/dL (ref 0.44–1.00)
GFR calc Af Amer: >60 mL/min (ref >60)
GFR calc non Af Amer: >60 mL/min (ref >60)
Glucose, Bld: 124 mg/dL — ABNORMAL HIGH (ref 65–99)
Potassium: 3.7 mmol/L (ref 3.5–5.1)
Sodium: 141 mmol/L (ref 135–145)

## 2015-01-03 LAB — CBC WITH DIFFERENTIAL/PLATELET
Basophils Absolute: 0.1 10*3/uL (ref 0–0.1)
Basophils Relative: 2 %
Eosinophils Absolute: 0.1 10*3/uL (ref 0–0.7)
Eosinophils Relative: 1 %
HCT: 34.5 % — ABNORMAL LOW (ref 35.0–47.0)
Hemoglobin: 11 g/dL — ABNORMAL LOW (ref 12.0–16.0)
Lymphocytes Relative: 29 %
Lymphs Abs: 2.2 10*3/uL (ref 1.0–3.6)
MCH: 24.3 pg — ABNORMAL LOW (ref 26.0–34.0)
MCHC: 31.9 g/dL — ABNORMAL LOW (ref 32.0–36.0)
MCV: 76.2 fL — ABNORMAL LOW (ref 80.0–100.0)
Monocytes Absolute: 0.4 10*3/uL (ref 0.2–0.9)
Monocytes Relative: 6 %
Neutro Abs: 4.8 10*3/uL (ref 1.4–6.5)
Neutrophils Relative %: 62 %
Platelets: 176 10*3/uL (ref 150–440)
RBC: 4.52 MIL/uL (ref 3.80–5.20)
RDW: 16.1 % — ABNORMAL HIGH (ref 11.5–14.5)
WBC: 7.6 10*3/uL (ref 3.6–11.0)

## 2015-01-03 MED ORDER — IBUPROFEN 600 MG PO TABS: 600.0000 mg | ORAL_TABLET | Freq: Three times a day (TID) | ORAL | Status: DC | PRN

## 2015-01-03 MED ORDER — HYDROCODONE-ACETAMINOPHEN 5-325 MG PO TABS: 1.0000 | ORAL_TABLET | Freq: Four times a day (QID) | ORAL | Status: DC | PRN

## 2015-01-03 NOTE — ED Provider Notes (Signed)
[facility] Emergency Department Provider Note  ____________________________________________  Time seen: Approximately 12:28 AM  I have reviewed the triage vital signs and the nursing notes.   HISTORY  Chief Complaint Flank Pain    HPI Helen Mccormick is a 74 y.o. female who presents to the ED from home with a chief complaint of flank pain. Patient notes left-sided flank pain 2 weeks which she describes as sharp and constant in nature, radiating to her left lower quadrant. For the past one week she has experienced urinary frequency. Denies fever, chills, chest pain, shortness of breath, nausea, vomiting, diarrhea. Denies prior history of kidney stones or diverticulitis. Denies recent travel or trauma. States she works in the yard a lot and movement aggravates her pain. Nothing makes the pain better.   Past Medical History  Diagnosis Date  . Hypertension   . Thyroid disease   . Heart murmur     There are no active problems to display for this patient.   Past Surgical History  Procedure Laterality Date  . Abdominal hysterectomy    . Back surgery    . Thyroid surgery    . Cervical fusion      No current outpatient prescriptions on file.  Allergies Amlodipine  No family history on file.  Social History Social History  Substance Use Topics  . Smoking status: Never Smoker   . Smokeless tobacco: None  . Alcohol Use: No    Review of Systems Constitutional: No fever/chills Eyes: No visual changes. ENT: No sore throat. Cardiovascular: Denies chest pain. Respiratory: Denies shortness of breath. Gastrointestinal: No abdominal pain.  No nausea, no vomiting.  No diarrhea.  No constipation. Genitourinary: Positive for frequency. Negative for dysuria. Musculoskeletal: Positive for flank pain. Skin: Negative for rash. Neurological: Negative for headaches, focal weakness or numbness.  10-point ROS otherwise  negative.  ____________________________________________   PHYSICAL EXAM:  VITAL SIGNS: ED Triage Vitals  Enc Vitals Group     BP 01/02/15 2059 168/67 mmHg     Pulse Rate 01/02/15 2059 55     Resp 01/02/15 2059 18     Temp 01/02/15 2059 98.4 F (36.9 C)     Temp Source 01/02/15 2059 Oral     SpO2 01/02/15 2059 98 %     Weight 01/02/15 2059 160 lb (72.576 kg)     Height 01/02/15 2059 5\' 4"  (1.626 m)     Head Cir --      Peak Flow --      Pain Score 01/02/15 2059 8     Pain Loc --      Pain Edu? --      Excl. in [place]? --     Constitutional: Alert and oriented. Well appearing and in no acute distress. Eyes: Conjunctivae are normal. PERRL. EOMI. Head: Atraumatic. Nose: No congestion/rhinnorhea. Mouth/Throat: Mucous membranes are moist.  Oropharynx non-erythematous. Neck: No stridor. No cervical spine tenderness to palpation. Cardiovascular: Normal rate, regular rhythm. Grossly normal heart sounds.  Good peripheral circulation. Respiratory: Normal respiratory effort.  No retractions. Lungs CTAB. Gastrointestinal: Soft and nontender. No distention. No abdominal bruits. No CVA tenderness. Musculoskeletal: No lower extremity tenderness nor edema.  No joint effusions. Neurologic:  Normal speech and language. No gross focal neurologic deficits are appreciated. No gait instability. Skin:  Skin is warm, dry and intact. No rash noted. Psychiatric: Mood and affect are normal. Speech and behavior are normal.  ____________________________________________   LABS (all labs ordered are listed, but only abnormal  results are displayed)  Labs Reviewed  URINALYSIS COMPLETEWITH MICROSCOPIC ([place] ONLY) - Abnormal; Notable for the following:    Color, Urine YELLOW (*)    APPearance CLEAR (*)    Squamous Epithelial / LPF 0-5 (*)    All other components within normal limits  CBC WITH DIFFERENTIAL/PLATELET - Abnormal; Notable for the following:    Hemoglobin 11.0 (*)    HCT 34.5 (*)    MCV  76.2 (*)    MCH 24.3 (*)    MCHC 31.9 (*)    RDW 16.1 (*)    All other components within normal limits  BASIC METABOLIC PANEL - Abnormal; Notable for the following:    Glucose, Bld 124 (*)    BUN 22 (*)    Anion gap 3 (*)    All other components within normal limits   ____________________________________________  EKG  None ____________________________________________  RADIOLOGY  CT Renal Stone Study interpreted per Dr. Radene Knee: 1. No acute abnormality seen within the abdomen or pelvis. 2. Scattered calcification along the abdominal aorta and its branches. ____________________________________________   PROCEDURES  Procedure(s) performed: None  Critical Care performed: No  ____________________________________________   INITIAL IMPRESSION / ASSESSMENT AND PLAN / ED COURSE  Pertinent labs & imaging results that were available during my care of the patient were reviewed by me and considered in my medical decision making (see chart for details).  74 year old female who presents with a two-week history of left flank pain and urinary frequency for the past week. Urinalysis unremarkable. Will check basic lab work and obtain CT scan to evaluate for kidney stone.  ----------------------------------------- 4:11 AM on 01/03/2015 -----------------------------------------  Patient resting in NAD. Updated her of CT results. Patient's back pain is likely musculoskeletal. Will prescribe NSAIDs and mild analgesia. Follow-up with orthopedics. Strict return precautions given. Patient verbalizes understanding and agrees with plan. ____________________________________________   FINAL CLINICAL IMPRESSION(S) / ED DIAGNOSES  Final diagnoses:  Flank pain      Paulette Blanch, MD 01/03/15 [phone]

## 2015-01-03 NOTE — ED Notes (Signed)
Pt returned from CT; uprite on stretcher with no distress noted; pt reports left flank pain with urinary frequency & incontinence x week; denies hx of same; resp even/unlab, lungs clear; +BS, abd soft/nondist/nontender

## 2015-01-03 NOTE — Discharge Instructions (Signed)
1. Take pain medicines as needed (Motrin/Norco #15). 2. Return to the ER for worsening symptoms, persistent vomiting, difficulty breathing or other concerns.  Flank Pain Flank pain refers to pain that is located on the side of the body between the upper abdomen and the back. The pain may occur over a short period of time (acute) or may be long-term or reoccurring (chronic). It may be mild or severe. Flank pain can be caused by many things. CAUSES  Some of the more common causes of flank pain include:  Muscle strains.   Muscle spasms.   A disease of your spine (vertebral disk disease).   A lung infection (pneumonia).   Fluid around your lungs (pulmonary edema).   A kidney infection.   Kidney stones.   A very painful skin rash caused by the chickenpox virus (shingles).   Gallbladder disease.  [place] care will depend on the cause of your pain. In general,  Rest as directed by your caregiver.  Drink enough fluids to keep your urine clear or pale yellow.  Only take over-the-counter or prescription medicines as directed by your caregiver. Some medicines may help relieve the pain.  Tell your caregiver about any changes in your pain.  Follow up with your caregiver as directed. SEEK IMMEDIATE MEDICAL CARE IF:   Your pain is not controlled with medicine.   You have new or worsening symptoms.  Your pain increases.   You have abdominal pain.   You have shortness of breath.   You have persistent nausea or vomiting.   You have swelling in your abdomen.   You feel faint or pass out.   You have blood in your urine.  You have a fever or persistent symptoms for more than 2-3 days.  You have a fever and your symptoms suddenly get worse. MAKE SURE YOU:   Understand these instructions.  Will watch your condition.  Will get help right away if you are not doing well or get worse.   This information is not intended to replace advice  given to you by your health care provider. Make sure you discuss any questions you have with your health care provider.   Document Released: 05/07/2005 Document Revised: 12/09/2011 Document Reviewed: 10/29/2011 Elsevier Interactive Patient Education [employer].

## 2015-01-08 DIAGNOSIS — J3089 Other allergic rhinitis: Secondary | ICD-10-CM | POA: Diagnosis not present

## 2015-01-08 DIAGNOSIS — J3081 Allergic rhinitis due to animal (cat) (dog) hair and dander: Secondary | ICD-10-CM | POA: Diagnosis not present

## 2015-01-08 DIAGNOSIS — J301 Allergic rhinitis due to pollen: Secondary | ICD-10-CM | POA: Diagnosis not present

## 2015-01-11 DIAGNOSIS — J3081 Allergic rhinitis due to animal (cat) (dog) hair and dander: Secondary | ICD-10-CM | POA: Diagnosis not present

## 2015-01-11 DIAGNOSIS — J3089 Other allergic rhinitis: Secondary | ICD-10-CM | POA: Diagnosis not present

## 2015-01-11 DIAGNOSIS — J301 Allergic rhinitis due to pollen: Secondary | ICD-10-CM | POA: Diagnosis not present

## 2015-01-17 DIAGNOSIS — J3089 Other allergic rhinitis: Secondary | ICD-10-CM | POA: Diagnosis not present

## 2015-01-17 DIAGNOSIS — J301 Allergic rhinitis due to pollen: Secondary | ICD-10-CM | POA: Diagnosis not present

## 2015-01-17 DIAGNOSIS — J3081 Allergic rhinitis due to animal (cat) (dog) hair and dander: Secondary | ICD-10-CM | POA: Diagnosis not present

## 2015-01-22 DIAGNOSIS — J3081 Allergic rhinitis due to animal (cat) (dog) hair and dander: Secondary | ICD-10-CM | POA: Diagnosis not present

## 2015-01-22 DIAGNOSIS — J301 Allergic rhinitis due to pollen: Secondary | ICD-10-CM | POA: Diagnosis not present

## 2015-01-22 DIAGNOSIS — J3089 Other allergic rhinitis: Secondary | ICD-10-CM | POA: Diagnosis not present

## 2015-01-24 DIAGNOSIS — Z0001 Encounter for general adult medical examination with abnormal findings: Secondary | ICD-10-CM | POA: Diagnosis not present

## 2015-01-24 DIAGNOSIS — E2839 Other primary ovarian failure: Secondary | ICD-10-CM | POA: Diagnosis not present

## 2015-01-24 DIAGNOSIS — Z0000 Encounter for general adult medical examination without abnormal findings: Secondary | ICD-10-CM | POA: Diagnosis not present

## 2015-01-24 DIAGNOSIS — E119 Type 2 diabetes mellitus without complications: Secondary | ICD-10-CM | POA: Diagnosis not present

## 2015-01-24 DIAGNOSIS — Z23 Encounter for immunization: Secondary | ICD-10-CM | POA: Diagnosis not present

## 2015-01-24 DIAGNOSIS — E039 Hypothyroidism, unspecified: Secondary | ICD-10-CM | POA: Diagnosis not present

## 2015-01-24 DIAGNOSIS — I10 Essential (primary) hypertension: Secondary | ICD-10-CM | POA: Diagnosis not present

## 2015-01-24 DIAGNOSIS — D509 Iron deficiency anemia, unspecified: Secondary | ICD-10-CM | POA: Diagnosis not present

## 2015-02-01 DIAGNOSIS — Z23 Encounter for immunization: Secondary | ICD-10-CM | POA: Diagnosis not present

## 2015-02-04 DIAGNOSIS — M25611 Stiffness of right shoulder, not elsewhere classified: Secondary | ICD-10-CM | POA: Diagnosis not present

## 2015-02-04 DIAGNOSIS — M25511 Pain in right shoulder: Secondary | ICD-10-CM | POA: Diagnosis not present

## 2015-02-04 DIAGNOSIS — M7501 Adhesive capsulitis of right shoulder: Secondary | ICD-10-CM | POA: Diagnosis not present

## 2015-02-04 DIAGNOSIS — M7541 Impingement syndrome of right shoulder: Secondary | ICD-10-CM | POA: Diagnosis not present

## 2015-02-05 DIAGNOSIS — E756 Lipid storage disorder, unspecified: Secondary | ICD-10-CM | POA: Diagnosis not present

## 2015-02-05 DIAGNOSIS — D649 Anemia, unspecified: Secondary | ICD-10-CM | POA: Diagnosis not present

## 2015-02-05 DIAGNOSIS — E538 Deficiency of other specified B group vitamins: Secondary | ICD-10-CM | POA: Diagnosis not present

## 2015-02-05 DIAGNOSIS — R5383 Other fatigue: Secondary | ICD-10-CM | POA: Diagnosis not present

## 2015-02-05 DIAGNOSIS — Z0001 Encounter for general adult medical examination with abnormal findings: Secondary | ICD-10-CM | POA: Diagnosis not present

## 2015-02-05 DIAGNOSIS — D509 Iron deficiency anemia, unspecified: Secondary | ICD-10-CM | POA: Diagnosis not present

## 2015-02-05 DIAGNOSIS — E782 Mixed hyperlipidemia: Secondary | ICD-10-CM | POA: Diagnosis not present

## 2015-02-05 DIAGNOSIS — I10 Essential (primary) hypertension: Secondary | ICD-10-CM | POA: Diagnosis not present

## 2015-02-05 DIAGNOSIS — R5381 Other malaise: Secondary | ICD-10-CM | POA: Diagnosis not present

## 2015-02-07 DIAGNOSIS — M7501 Adhesive capsulitis of right shoulder: Secondary | ICD-10-CM | POA: Diagnosis not present

## 2015-02-07 DIAGNOSIS — M25511 Pain in right shoulder: Secondary | ICD-10-CM | POA: Diagnosis not present

## 2015-02-07 DIAGNOSIS — M7541 Impingement syndrome of right shoulder: Secondary | ICD-10-CM | POA: Diagnosis not present

## 2015-02-07 DIAGNOSIS — M25611 Stiffness of right shoulder, not elsewhere classified: Secondary | ICD-10-CM | POA: Diagnosis not present

## 2015-02-08 DIAGNOSIS — J301 Allergic rhinitis due to pollen: Secondary | ICD-10-CM | POA: Diagnosis not present

## 2015-02-08 DIAGNOSIS — E2839 Other primary ovarian failure: Secondary | ICD-10-CM | POA: Diagnosis not present

## 2015-02-08 DIAGNOSIS — J3089 Other allergic rhinitis: Secondary | ICD-10-CM | POA: Diagnosis not present

## 2015-02-11 DIAGNOSIS — M7541 Impingement syndrome of right shoulder: Secondary | ICD-10-CM | POA: Diagnosis not present

## 2015-02-11 DIAGNOSIS — M7501 Adhesive capsulitis of right shoulder: Secondary | ICD-10-CM | POA: Diagnosis not present

## 2015-02-11 DIAGNOSIS — M25611 Stiffness of right shoulder, not elsewhere classified: Secondary | ICD-10-CM | POA: Diagnosis not present

## 2015-02-11 DIAGNOSIS — M25511 Pain in right shoulder: Secondary | ICD-10-CM | POA: Diagnosis not present

## 2015-02-13 DIAGNOSIS — M25511 Pain in right shoulder: Secondary | ICD-10-CM | POA: Diagnosis not present

## 2015-02-13 DIAGNOSIS — M25611 Stiffness of right shoulder, not elsewhere classified: Secondary | ICD-10-CM | POA: Diagnosis not present

## 2015-02-13 DIAGNOSIS — M7501 Adhesive capsulitis of right shoulder: Secondary | ICD-10-CM | POA: Diagnosis not present

## 2015-02-13 DIAGNOSIS — M7541 Impingement syndrome of right shoulder: Secondary | ICD-10-CM | POA: Diagnosis not present

## 2015-02-15 DIAGNOSIS — J3081 Allergic rhinitis due to animal (cat) (dog) hair and dander: Secondary | ICD-10-CM | POA: Diagnosis not present

## 2015-02-15 DIAGNOSIS — J3089 Other allergic rhinitis: Secondary | ICD-10-CM | POA: Diagnosis not present

## 2015-02-15 DIAGNOSIS — J301 Allergic rhinitis due to pollen: Secondary | ICD-10-CM | POA: Diagnosis not present

## 2015-02-18 DIAGNOSIS — M25611 Stiffness of right shoulder, not elsewhere classified: Secondary | ICD-10-CM | POA: Diagnosis not present

## 2015-02-18 DIAGNOSIS — M7541 Impingement syndrome of right shoulder: Secondary | ICD-10-CM | POA: Diagnosis not present

## 2015-02-18 DIAGNOSIS — M7501 Adhesive capsulitis of right shoulder: Secondary | ICD-10-CM | POA: Diagnosis not present

## 2015-02-18 DIAGNOSIS — M25511 Pain in right shoulder: Secondary | ICD-10-CM | POA: Diagnosis not present

## 2015-02-19 DIAGNOSIS — E876 Hypokalemia: Secondary | ICD-10-CM | POA: Diagnosis not present

## 2015-02-25 DIAGNOSIS — M25511 Pain in right shoulder: Secondary | ICD-10-CM | POA: Diagnosis not present

## 2015-02-25 DIAGNOSIS — M7541 Impingement syndrome of right shoulder: Secondary | ICD-10-CM | POA: Diagnosis not present

## 2015-02-25 DIAGNOSIS — M25611 Stiffness of right shoulder, not elsewhere classified: Secondary | ICD-10-CM | POA: Diagnosis not present

## 2015-02-25 DIAGNOSIS — M7501 Adhesive capsulitis of right shoulder: Secondary | ICD-10-CM | POA: Diagnosis not present

## 2015-02-27 ENCOUNTER — Emergency Department: Payer: Medicare Other

## 2015-02-27 ENCOUNTER — Emergency Department
Admission: EM | Admit: 2015-02-27 | Discharge: 2015-02-27 | Disposition: A | Discharge status: Home / Self Care | Payer: Medicare Other | Attending: Emergency Medicine | Admitting: Emergency Medicine

## 2015-02-27 DIAGNOSIS — E876 Hypokalemia: Secondary | ICD-10-CM | POA: Insufficient documentation

## 2015-02-27 DIAGNOSIS — Z79899 Other long term (current) drug therapy: Secondary | ICD-10-CM | POA: Diagnosis not present

## 2015-02-27 DIAGNOSIS — M549 Dorsalgia, unspecified: Secondary | ICD-10-CM

## 2015-02-27 DIAGNOSIS — I10 Essential (primary) hypertension: Secondary | ICD-10-CM | POA: Diagnosis not present

## 2015-02-27 DIAGNOSIS — M5136 Other intervertebral disc degeneration, lumbar region: Secondary | ICD-10-CM | POA: Diagnosis not present

## 2015-02-27 DIAGNOSIS — Z7951 Long term (current) use of inhaled steroids: Secondary | ICD-10-CM | POA: Diagnosis not present

## 2015-02-27 DIAGNOSIS — R52 Pain, unspecified: Secondary | ICD-10-CM | POA: Diagnosis present

## 2015-02-27 DIAGNOSIS — M51369 Other intervertebral disc degeneration, lumbar region without mention of lumbar back pain or lower extremity pain: Secondary | ICD-10-CM

## 2015-02-27 DIAGNOSIS — M545 Low back pain: Secondary | ICD-10-CM | POA: Diagnosis not present

## 2015-02-27 LAB — CBC WITH DIFFERENTIAL/PLATELET
Basophils Absolute: 0 10*3/uL (ref 0–0.1)
Basophils Relative: 1 %
Eosinophils Absolute: 0.2 10*3/uL (ref 0–0.7)
Eosinophils Relative: 3 %
HCT: 33.3 % — ABNORMAL LOW (ref 35.0–47.0)
Hemoglobin: 10.6 g/dL — ABNORMAL LOW (ref 12.0–16.0)
Lymphocytes Relative: 43 %
Lymphs Abs: 2.5 10*3/uL (ref 1.0–3.6)
MCH: 24.4 pg — ABNORMAL LOW (ref 26.0–34.0)
MCHC: 31.9 g/dL — ABNORMAL LOW (ref 32.0–36.0)
MCV: 76.7 fL — ABNORMAL LOW (ref 80.0–100.0)
Monocytes Absolute: 0.4 10*3/uL (ref 0.2–0.9)
Monocytes Relative: 8 %
Neutro Abs: 2.6 10*3/uL (ref 1.4–6.5)
Neutrophils Relative %: 45 %
Platelets: 194 10*3/uL (ref 150–440)
RBC: 4.34 MIL/uL (ref 3.80–5.20)
RDW: 15.5 % — ABNORMAL HIGH (ref 11.5–14.5)
WBC: 5.8 10*3/uL (ref 3.6–11.0)

## 2015-02-27 LAB — URINALYSIS COMPLETE WITH MICROSCOPIC (ARMC ONLY)
Bacteria, UA: NONE SEEN
Bilirubin Urine: NEGATIVE
Glucose, UA: NEGATIVE mg/dL
Hgb urine dipstick: NEGATIVE
Ketones, ur: NEGATIVE mg/dL
Leukocytes, UA: NEGATIVE
Nitrite: NEGATIVE
Protein, ur: NEGATIVE mg/dL
Specific Gravity, Urine: 1.024 (ref 1.005–1.030)
pH: 5 (ref 5.0–8.0)

## 2015-02-27 LAB — BASIC METABOLIC PANEL
Anion gap: 7 (ref 5–15)
BUN: 20 mg/dL (ref 6–20)
CO2: 30 mmol/L (ref 22–32)
Calcium: 8.9 mg/dL (ref 8.9–10.3)
Chloride: 104 mmol/L (ref 101–111)
Creatinine, Ser: 0.74 mg/dL (ref 0.44–1.00)
GFR calc Af Amer: >60 mL/min (ref >60)
GFR calc non Af Amer: >60 mL/min (ref >60)
Glucose, Bld: 116 mg/dL — ABNORMAL HIGH (ref 65–99)
Potassium: 2.6 mmol/L — CL (ref 3.5–5.1)
Sodium: 141 mmol/L (ref 135–145)

## 2015-02-27 MED ORDER — POTASSIUM CHLORIDE CRYS ER 20 MEQ PO TBCR
20.0000 meq | EXTENDED_RELEASE_TABLET | Freq: Once | ORAL | Status: AC
Start: 2015-02-27 — End: 2015-02-27
  Administered 2015-02-27: 20 meq via ORAL
  Filled 2015-02-27: qty 1

## 2015-02-27 MED ORDER — MELOXICAM 15 MG PO TABS: 15.0000 mg | ORAL_TABLET | Freq: Every day | ORAL | Status: DC

## 2015-02-27 MED ORDER — POTASSIUM CHLORIDE ER 20 MEQ PO TBCR: 10.0000 meq | EXTENDED_RELEASE_TABLET | Freq: Once | ORAL | Status: DC

## 2015-02-27 MED ORDER — KETOROLAC TROMETHAMINE 30 MG/ML IJ SOLN
30.0000 mg | Freq: Once | INTRAMUSCULAR | Status: AC
  Administered 2015-02-27: 30 mg via INTRAMUSCULAR
  Filled 2015-02-27: qty 1

## 2015-02-27 NOTE — ED Notes (Signed)
Report received from [facility].Marland Kitchen

## 2015-02-27 NOTE — ED Notes (Signed)
Pt resting comfortably at present, pt awaiting MD eval..

## 2015-02-27 NOTE — ED Provider Notes (Addendum)
Patient was seen in this primarily by the physician assistant. I examined the patient and she was found to have some hypokalemia per laboratory work. Patient had an EKG which did not show any significant abnormalities at this point. I'm not sure of the source of her hypokalemia but place her on 20 mEq of by mouth potassium after given her 20 mEq here in emergency department. She was advised touch base with her primary physician for recheck. She does not have any nausea, vomiting, diarrhea or is on any medication that would be a diuretic that would lower her potassium at this time. She was advised drink plenty of fluids and return here if she has any new concerns. Patient's only complaint to me with some chronic right shoulder discomfort.   ED ECG REPORT I, Daymon Larsen, the attending physician, personally viewed and interpreted this ECG.  Date: 02/27/2015 EKG Time: 0942 Rate: 54 Rhythm: normal sinus rhythm QRS Axis: normal Intervals: normal ST/T Wave abnormalities: Left bundle branch block Conduction Disutrbances: none Narrative Interpretation: unremarkable No significant change  Daymon Larsen, MD 02/27/15 1313  Daymon Larsen, MD 02/27/15 1313

## 2015-02-27 NOTE — Discharge Instructions (Signed)
Back Pain, Adult °Back pain is very common in adults. The cause of back pain is rarely dangerous and the pain often gets better over time. The cause of your back pain may not be known. Some common causes of back pain include: °· Strain of the muscles or ligaments supporting the spine. °· Wear and tear (degeneration) of the spinal disks. °· Arthritis. °· Direct injury to the back. °For many people, back pain may return. Since back pain is rarely dangerous, most people can learn to manage this condition on their own. °HOME CARE INSTRUCTIONS °Watch your back pain for any changes. The following actions may help to lessen any discomfort you are feeling: °· Remain active. It is stressful on your back to sit or stand in one place for long periods of time. Do not sit, drive, or stand in one place for more than 30 minutes at a time. Take short walks on even surfaces as soon as you are able. Try to increase the length of time you walk each day. °· Exercise regularly as directed by your health care provider. Exercise helps your back heal faster. It also helps avoid future injury by keeping your muscles strong and flexible. °· Do not stay in bed. Resting more than 1-2 days can delay your recovery. °· Pay attention to your body when you bend and lift. The most comfortable positions are those that put less stress on your recovering back. Always use proper lifting techniques, including: °· Bending your knees. °· Keeping the load close to your body. °· Avoiding twisting. °· Find a comfortable position to sleep. Use a firm mattress and lie on your side with your knees slightly bent. If you lie on your back, put a pillow under your knees. °· Avoid feeling anxious or stressed. Stress increases muscle tension and can worsen back pain. It is important to recognize when you are anxious or stressed and learn ways to manage it, such as with exercise. °· Take medicines only as directed by your health care provider. Over-the-counter  medicines to reduce pain and inflammation are often the most helpful. Your health care provider may prescribe muscle relaxant drugs. These medicines help dull your pain so you can more quickly return to your normal activities and healthy exercise. °· Apply ice to the injured area: °· Put ice in a plastic bag. °· Place a towel between your skin and the bag. °· Leave the ice on for 20 minutes, 2-3 times a day for the first 2-3 days. After that, ice and heat may be alternated to reduce pain and spasms. °· Maintain a healthy weight. Excess weight puts extra stress on your back and makes it difficult to maintain good posture. °SEEK MEDICAL CARE IF: °· You have pain that is not relieved with rest or medicine. °· You have increasing pain going down into the legs or buttocks. °· You have pain that does not improve in one week. °· You have night pain. °· You lose weight. °· You have a fever or chills. °SEEK IMMEDIATE MEDICAL CARE IF:  °· You develop new bowel or bladder control problems. °· You have unusual weakness or numbness in your arms or legs. °· You develop nausea or vomiting. °· You develop abdominal pain. °· You feel faint. °  °This information is not intended to replace advice given to you by your health care provider. Make sure you discuss any questions you have with your health care provider. °  °Document Released: 03/16/2005 Document Revised: 04/06/2014 Document Reviewed: 07/18/2013 °Elsevier Interactive Patient Education ©2016 Elsevier   Inc.  Degenerative Disk Disease Degenerative disk disease is a condition caused by the changes that occur in spinal disks as you grow older. Spinal disks are soft and compressible disks located between the bones of your spine (vertebrae). These disks act like shock absorbers. Degenerative disk disease can affect the whole spine. However, the neck and lower back are most commonly affected. Many changes can occur in the spinal disks with aging, such as:  The spinal disks may  dry and shrink.  Small tears may occur in the tough, outer covering of the disk (annulus).  The disk space may become smaller due to loss of water.  Abnormal growths in the bone (spurs) may occur. This can put pressure on the nerve roots exiting the spinal canal, causing pain.  The spinal canal may become narrowed. RISK FACTORS   Being overweight.  Having a family history of degenerative disk disease.  Smoking.  There is increased risk if you are doing heavy lifting or have a sudden injury. SIGNS AND SYMPTOMS  Symptoms vary from person to person and may include:  Pain that varies in intensity. Some people have no pain, while others have severe pain. The location of the pain depends on the part of your backbone that is affected.  You will have neck or arm pain if a disk in the neck area is affected.  You will have pain in your back, buttocks, or legs if a disk in the lower back is affected.  Pain that becomes worse while bending, reaching up, or with twisting movements.  Pain that may start gradually and then get worse as time passes. It may also start after a major or minor injury.  Numbness or tingling in the arms or legs. DIAGNOSIS  Your health care provider will ask you about your symptoms and about activities or habits that may cause the pain. He or she may also ask about any injuries, diseases, or treatments you have had. Your health care provider will examine you to check for the range of movement that is possible in the affected area, to check for strength in your extremities, and to check for sensation in the areas of the arms and legs supplied by different nerve roots. You may also have:   An X-ray of the spine.  Other imaging tests, such as MRI. TREATMENT  Your health care provider will advise you on the best plan for treatment. Treatment may include:  Medicines.  Rehabilitation exercises. HOME CARE INSTRUCTIONS   Follow proper lifting and walking techniques as  advised by your health care provider.  Maintain good posture.  Exercise regularly as advised by your health care provider.  Perform relaxation exercises.  Change your sitting, standing, and sleeping habits as advised by your health care provider.  Change positions frequently.  Lose weight or maintain a healthy weight as advised by your health care provider.  Do not use any tobacco products, including cigarettes, chewing tobacco, or electronic cigarettes. If you need help quitting, ask your health care provider.  Wear supportive footwear.  Take medicines only as directed by your health care provider. SEEK MEDICAL CARE IF:   Your pain does not go away within 1-4 weeks.  You have significant appetite or weight loss. SEEK IMMEDIATE MEDICAL CARE IF:   Your pain is severe.  You notice weakness in your arms, hands, or legs.  You begin to lose control of your bladder or bowel movements.  You have fevers or night sweats. MAKE SURE YOU:  Understand these instructions.  Will watch your condition.  Will get help right away if you are not doing well or get worse.   This information is not intended to replace advice given to you by your health care provider. Make sure you discuss any questions you have with your health care provider.   Document Released: 01/11/2007 Document Revised: 04/06/2014 Document Reviewed: 07/18/2013 Elsevier Interactive Patient Education [employer].  Please return immediately if condition worsens. Please contact her primary physician or the physician you were given for referral. If you have any specialist physicians involved in her treatment and plan please also contact them. Thank you for using [facility].

## 2015-02-27 NOTE — ED Provider Notes (Signed)
[facility] Emergency Department Provider Note  ____________________________________________  Time seen: Approximately 7:37 AM  I have reviewed the triage vital signs and the nursing notes.   HISTORY  Chief Complaint Generalized Body Aches    HPI Helen Mccormick is a 74 y.o. female presents for evaluation of generalized body aches for 1 month. Patient states that her aches are worse with movement and lying down. Patient reports that she was prescribed Norco and tramadol one month ago. Has not helped.   Past Medical History  Diagnosis Date  . Hypertension   . Thyroid disease   . Heart murmur     There are no active problems to display for this patient.   Past Surgical History  Procedure Laterality Date  . Abdominal hysterectomy    . Back surgery    . Thyroid surgery    . Cervical fusion      Current Outpatient Rx  Name  Route  Sig  Dispense  Refill  . atenolol (TENORMIN) 50 MG tablet   Oral   Take 50 mg by mouth daily.         Marland Kitchen atorvastatin (LIPITOR) 20 MG tablet   Oral   Take 20 mg by mouth daily.         Marland Kitchen escitalopram (LEXAPRO) 20 MG tablet   Oral   Take 20 mg by mouth daily.         . ferrous sulfate 325 (65 FE) MG tablet   Oral   Take 325 mg by mouth daily with breakfast.         . fluticasone (FLONASE) 50 MCG/ACT nasal spray   Each Nare   Place 2 sprays into both nostrils daily.         Marland Kitchen levothyroxine (SYNTHROID, LEVOTHROID) 75 MCG tablet   Oral   Take 75 mcg by mouth daily before breakfast.         . lubiprostone (AMITIZA) 8 MCG capsule   Oral   Take 8 mcg by mouth 2 (two) times daily with a meal.         . omeprazole (PRILOSEC) 40 MG capsule   Oral   Take 40 mg by mouth daily.         . traMADol (ULTRAM) 50 MG tablet   Oral   Take by mouth every 6 (six) hours as needed.         Marland Kitchen HYDROcodone-acetaminophen (NORCO) 5-325 MG tablet   Oral   Take 1 tablet by mouth every 6 (six) hours as  needed for moderate pain.   15 tablet   0   . meloxicam (MOBIC) 15 MG tablet   Oral   Take 1 tablet (15 mg total) by mouth daily.   30 tablet   2     Allergies Amlodipine  No family history on file.  Social History Social History  Substance Use Topics  . Smoking status: Never Smoker   . Smokeless tobacco: None  . Alcohol Use: No    Review of Systems Constitutional: No fever/chills Eyes: No visual changes. ENT: No sore throat. Cardiovascular: Denies chest pain. Respiratory: Denies shortness of breath. Gastrointestinal: No abdominal pain.  No nausea, no vomiting.  No diarrhea.  No constipation. Genitourinary: Negative for dysuria. Musculoskeletal: Positive for generalized body aches. Skin: Negative for rash. Neurological: Negative for headaches, focal weakness or numbness.  10-point ROS otherwise negative.  ____________________________________________   PHYSICAL EXAM:  VITAL SIGNS: ED Triage Vitals  Enc Vitals Group  BP 02/27/15 0722 135/62 mmHg     Pulse Rate 02/27/15 0722 65     Resp 02/27/15 0722 20     Temp 02/27/15 0722 98.4 F (36.9 C)     Temp Source 02/27/15 0722 Oral     SpO2 02/27/15 0722 98 %     Weight 02/27/15 0722 162 lb (73.483 kg)     Height 02/27/15 0722 5\' 4"  (1.626 m)     Head Cir --      Peak Flow --      Pain Score --      Pain Loc --      Pain Edu? --      Excl. in [place]? --     Constitutional: Alert and oriented. Well appearing and in no acute distress. Eyes: Conjunctivae are normal. PERRL. EOMI. Head: Atraumatic. Nose: No congestion/rhinnorhea. Mouth/Throat: Mucous membranes are moist.  Oropharynx non-erythematous. Neck: No stridor.   Cardiovascular: Normal rate, regular rhythm. Grossly normal heart sounds.  Good peripheral circulation. Respiratory: Normal respiratory effort.  No retractions. Lungs CTAB. Gastrointestinal: Soft and nontender. No distention. No abdominal bruits. No CVA tenderness. Musculoskeletal: No lower  extremity tenderness nor edema.  No joint effusions. Neurologic:  Normal speech and language. No gross focal neurologic deficits are appreciated. No gait instability. Skin:  Skin is warm, dry and intact. No rash noted. Psychiatric: Mood and affect are normal. Speech and behavior are normal.  ____________________________________________   LABS (all labs ordered are listed, but only abnormal results are displayed)  Labs Reviewed  BASIC METABOLIC PANEL - Abnormal; Notable for the following:    Potassium 2.6 (*)    Glucose, Bld 116 (*)    All other components within normal limits  CBC WITH DIFFERENTIAL/PLATELET - Abnormal; Notable for the following:    Hemoglobin 10.6 (*)    HCT 33.3 (*)    MCV 76.7 (*)    MCH 24.4 (*)    MCHC 31.9 (*)    RDW 15.5 (*)    All other components within normal limits   ____________________________________________   RADIOLOGY  1. Advanced chronic disc and endplate degeneration at L5-S1, and facet degeneration at L3-L4 and L4-L5 associated with trace spondylolisthesis. 2. No acute osseous abnormality in the lumbar spine. 3. Calcified aortic atherosclerosis. Bulky gonadal vein phleboliths incidentally noted. ____________________________________________   PROCEDURES  Procedure(s) performed: None  Critical Care performed: No  ____________________________________________   INITIAL IMPRESSION / ASSESSMENT AND PLAN / ED COURSE  Pertinent labs & imaging results that were available during my care of the patient were reviewed by me and considered in my medical decision making (see chart for details).  Arthralgia with severe chronic disc degeneration. Rx given for meloxicam 15 mg daily. Patient to return to PCP for any additional control drugs or medications. In addition patient was given baclofen 10 mg 3 times a day as needed for muscle aches. Laboratory results back with potassium of 2.6.  Patient transfer to room 12 (the main ER. Patient to be  followed by Dr. Marcelene Butte for hypokalemia. ____________________________________________   FINAL CLINICAL IMPRESSION(S) / ED DIAGNOSES  Final diagnoses:  Degenerative disc disease, lumbar  Arthralgia of back  Hypokalemia      Arlyss Repress, PA-C 02/27/15 [id number]  Daymon Larsen, MD 02/27/15 1525

## 2015-02-27 NOTE — ED Notes (Addendum)
Pt arrives POV c/o generalized body aches X1 month. Pt body aches worse with movement and lying down. Pt ambulatory to triage. Pt alert and oriented X4, active, cooperative, pt in NAD. RR even and unlabored, color WNL. Pt has not taken any medications for pain today. Pt reports that she was prescribed norco and tramadol X "1 month ago but it has not helped". Pt reports that her PCP prescribed these medications.

## 2015-02-27 NOTE — ED Notes (Signed)
States she has had generalized body aches for about 1 month  Denies any injury  Occasional fever . Has seen per pcp about 1 month ago  Given pain meds  W/o relief.no swelling noted  Ambulates well to treatment room

## 2015-03-06 DIAGNOSIS — M47892 Other spondylosis, cervical region: Secondary | ICD-10-CM | POA: Diagnosis not present

## 2015-03-06 DIAGNOSIS — M9971 Connective tissue and disc stenosis of intervertebral foramina of cervical region: Secondary | ICD-10-CM | POA: Diagnosis not present

## 2015-03-06 DIAGNOSIS — M47812 Spondylosis without myelopathy or radiculopathy, cervical region: Secondary | ICD-10-CM | POA: Diagnosis not present

## 2015-03-06 DIAGNOSIS — M542 Cervicalgia: Secondary | ICD-10-CM | POA: Diagnosis not present

## 2015-03-06 DIAGNOSIS — M5021 Other cervical disc displacement,  high cervical region: Secondary | ICD-10-CM | POA: Diagnosis not present

## 2015-04-09 DIAGNOSIS — G8929 Other chronic pain: Secondary | ICD-10-CM | POA: Diagnosis not present

## 2015-04-09 DIAGNOSIS — M5412 Radiculopathy, cervical region: Secondary | ICD-10-CM | POA: Diagnosis not present

## 2015-04-09 DIAGNOSIS — M25511 Pain in right shoulder: Secondary | ICD-10-CM | POA: Diagnosis not present

## 2015-04-24 DIAGNOSIS — K5900 Constipation, unspecified: Secondary | ICD-10-CM | POA: Diagnosis not present

## 2015-04-24 DIAGNOSIS — E119 Type 2 diabetes mellitus without complications: Secondary | ICD-10-CM | POA: Diagnosis not present

## 2015-04-24 DIAGNOSIS — M75101 Unspecified rotator cuff tear or rupture of right shoulder, not specified as traumatic: Secondary | ICD-10-CM | POA: Diagnosis not present

## 2015-04-24 DIAGNOSIS — M4722 Other spondylosis with radiculopathy, cervical region: Secondary | ICD-10-CM | POA: Diagnosis not present

## 2015-04-24 DIAGNOSIS — D509 Iron deficiency anemia, unspecified: Secondary | ICD-10-CM | POA: Diagnosis not present

## 2015-04-29 DIAGNOSIS — M25511 Pain in right shoulder: Secondary | ICD-10-CM | POA: Diagnosis not present

## 2015-04-29 DIAGNOSIS — G8929 Other chronic pain: Secondary | ICD-10-CM | POA: Diagnosis not present

## 2015-05-09 DIAGNOSIS — J301 Allergic rhinitis due to pollen: Secondary | ICD-10-CM | POA: Diagnosis not present

## 2015-05-09 DIAGNOSIS — J3081 Allergic rhinitis due to animal (cat) (dog) hair and dander: Secondary | ICD-10-CM | POA: Diagnosis not present

## 2015-05-09 DIAGNOSIS — J3089 Other allergic rhinitis: Secondary | ICD-10-CM | POA: Diagnosis not present

## 2015-05-16 DIAGNOSIS — G4733 Obstructive sleep apnea (adult) (pediatric): Secondary | ICD-10-CM | POA: Diagnosis not present

## 2015-05-16 DIAGNOSIS — J309 Allergic rhinitis, unspecified: Secondary | ICD-10-CM | POA: Diagnosis not present

## 2015-05-16 DIAGNOSIS — R0602 Shortness of breath: Secondary | ICD-10-CM | POA: Diagnosis not present

## 2015-05-17 DIAGNOSIS — J3089 Other allergic rhinitis: Secondary | ICD-10-CM | POA: Diagnosis not present

## 2015-05-17 DIAGNOSIS — J3081 Allergic rhinitis due to animal (cat) (dog) hair and dander: Secondary | ICD-10-CM | POA: Diagnosis not present

## 2015-05-21 DIAGNOSIS — J3089 Other allergic rhinitis: Secondary | ICD-10-CM | POA: Diagnosis not present

## 2015-05-21 DIAGNOSIS — J3081 Allergic rhinitis due to animal (cat) (dog) hair and dander: Secondary | ICD-10-CM | POA: Diagnosis not present

## 2015-05-21 DIAGNOSIS — J301 Allergic rhinitis due to pollen: Secondary | ICD-10-CM | POA: Diagnosis not present

## 2015-05-22 DIAGNOSIS — H9201 Otalgia, right ear: Secondary | ICD-10-CM | POA: Diagnosis not present

## 2015-05-22 DIAGNOSIS — H6122 Impacted cerumen, left ear: Secondary | ICD-10-CM | POA: Diagnosis not present

## 2015-05-22 DIAGNOSIS — H93292 Other abnormal auditory perceptions, left ear: Secondary | ICD-10-CM | POA: Diagnosis not present

## 2015-05-24 DIAGNOSIS — J3081 Allergic rhinitis due to animal (cat) (dog) hair and dander: Secondary | ICD-10-CM | POA: Diagnosis not present

## 2015-05-24 DIAGNOSIS — J3089 Other allergic rhinitis: Secondary | ICD-10-CM | POA: Diagnosis not present

## 2015-05-24 DIAGNOSIS — J301 Allergic rhinitis due to pollen: Secondary | ICD-10-CM | POA: Diagnosis not present

## 2015-05-28 DIAGNOSIS — J301 Allergic rhinitis due to pollen: Secondary | ICD-10-CM | POA: Diagnosis not present

## 2015-05-28 DIAGNOSIS — J3089 Other allergic rhinitis: Secondary | ICD-10-CM | POA: Diagnosis not present

## 2015-05-28 DIAGNOSIS — J3081 Allergic rhinitis due to animal (cat) (dog) hair and dander: Secondary | ICD-10-CM | POA: Diagnosis not present

## 2015-05-31 DIAGNOSIS — J3081 Allergic rhinitis due to animal (cat) (dog) hair and dander: Secondary | ICD-10-CM | POA: Diagnosis not present

## 2015-05-31 DIAGNOSIS — J3089 Other allergic rhinitis: Secondary | ICD-10-CM | POA: Diagnosis not present

## 2015-05-31 DIAGNOSIS — J301 Allergic rhinitis due to pollen: Secondary | ICD-10-CM | POA: Diagnosis not present

## 2015-06-06 DIAGNOSIS — J3081 Allergic rhinitis due to animal (cat) (dog) hair and dander: Secondary | ICD-10-CM | POA: Diagnosis not present

## 2015-06-06 DIAGNOSIS — J301 Allergic rhinitis due to pollen: Secondary | ICD-10-CM | POA: Diagnosis not present

## 2015-06-06 DIAGNOSIS — J3089 Other allergic rhinitis: Secondary | ICD-10-CM | POA: Diagnosis not present

## 2015-06-11 DIAGNOSIS — J301 Allergic rhinitis due to pollen: Secondary | ICD-10-CM | POA: Diagnosis not present

## 2015-06-11 DIAGNOSIS — J3081 Allergic rhinitis due to animal (cat) (dog) hair and dander: Secondary | ICD-10-CM | POA: Diagnosis not present

## 2015-06-11 DIAGNOSIS — J3089 Other allergic rhinitis: Secondary | ICD-10-CM | POA: Diagnosis not present

## 2015-06-13 DIAGNOSIS — J3089 Other allergic rhinitis: Secondary | ICD-10-CM | POA: Diagnosis not present

## 2015-06-13 DIAGNOSIS — J301 Allergic rhinitis due to pollen: Secondary | ICD-10-CM | POA: Diagnosis not present

## 2015-06-13 DIAGNOSIS — J3081 Allergic rhinitis due to animal (cat) (dog) hair and dander: Secondary | ICD-10-CM | POA: Diagnosis not present

## 2015-06-14 ENCOUNTER — Encounter: Payer: Self-pay | Admitting: Emergency Medicine

## 2015-06-14 ENCOUNTER — Emergency Department
Admission: EM | Admit: 2015-06-14 | Discharge: 2015-06-14 | Disposition: A | Discharge status: Home / Self Care | Payer: Medicare Other | Attending: Emergency Medicine | Admitting: Emergency Medicine

## 2015-06-14 DIAGNOSIS — I10 Essential (primary) hypertension: Secondary | ICD-10-CM | POA: Insufficient documentation

## 2015-06-14 DIAGNOSIS — G8929 Other chronic pain: Secondary | ICD-10-CM | POA: Insufficient documentation

## 2015-06-14 DIAGNOSIS — M25511 Pain in right shoulder: Secondary | ICD-10-CM | POA: Diagnosis not present

## 2015-06-14 DIAGNOSIS — M542 Cervicalgia: Secondary | ICD-10-CM | POA: Diagnosis not present

## 2015-06-14 DIAGNOSIS — M549 Dorsalgia, unspecified: Secondary | ICD-10-CM | POA: Diagnosis present

## 2015-06-14 LAB — URINALYSIS COMPLETE WITH MICROSCOPIC (ARMC ONLY)
Bacteria, UA: NONE SEEN
Bilirubin Urine: NEGATIVE
Glucose, UA: NEGATIVE mg/dL
Hgb urine dipstick: NEGATIVE
Ketones, ur: NEGATIVE mg/dL
Leukocytes, UA: NEGATIVE
Nitrite: NEGATIVE
Protein, ur: NEGATIVE mg/dL
Specific Gravity, Urine: 1.021 (ref 1.005–1.030)
pH: 5 (ref 5.0–8.0)

## 2015-06-14 MED ORDER — HYDROMORPHONE HCL 1 MG/ML IJ SOLN: 1.0000 mg | Freq: Once | INTRAMUSCULAR | Status: DC

## 2015-06-14 MED ORDER — TRAMADOL HCL 50 MG PO TABS: 50.0000 mg | ORAL_TABLET | Freq: Four times a day (QID) | ORAL | Status: DC | PRN

## 2015-06-14 MED ORDER — TETANUS-DIPHTH-ACELL PERTUSSIS 5-2.5-18.5 LF-MCG/0.5 IM SUSP: 0.5000 mL | Freq: Once | INTRAMUSCULAR | Status: DC

## 2015-06-14 MED ORDER — TRAMADOL HCL 50 MG PO TABS
50.0000 mg | ORAL_TABLET | Freq: Once | ORAL | Status: AC
  Administered 2015-06-14: 50 mg via ORAL
  Filled 2015-06-14: qty 1

## 2015-06-14 NOTE — ED Notes (Signed)
Developed generalized back pain about 2 weeks ago   Denies any trauma or urinary sx's

## 2015-06-14 NOTE — Discharge Instructions (Signed)

## 2015-06-14 NOTE — ED Provider Notes (Signed)
[facility] Emergency Department Provider Note  ____________________________________________  Time seen: Approximately 2:27 PM  I have reviewed the triage vital signs and the nursing notes.   HISTORY  Chief Complaint Back Pain   HPI Helen Mccormick is a 75 y.o. female is here with complaint of cervical pain especially on the right radiating down to her right shoulder for approximately 2 weeks. Patient denies any trauma. She denies any low back pain or urinary symptoms. She states that she has seen positive, and in the past for the same pain and has also been seen at [facility]. She had x-rays done at [facility] and has had an injection of cortisone into the right shoulder.She states pain is the same. She was told by her doctor in [place] that she needs to discontinue taking Percocet. She rates her pain as 9/10.   Past Medical History  Diagnosis Date  . Hypertension   . Thyroid disease   . Heart murmur     There are no active problems to display for this patient.   Past Surgical History  Procedure Laterality Date  . Abdominal hysterectomy    . Back surgery    . Thyroid surgery    . Cervical fusion      Current Outpatient Rx  Name  Route  Sig  Dispense  Refill  . atenolol (TENORMIN) 50 MG tablet   Oral   Take 50 mg by mouth daily.         Marland Kitchen atorvastatin (LIPITOR) 20 MG tablet   Oral   Take 20 mg by mouth at bedtime.          Marland Kitchen escitalopram (LEXAPRO) 20 MG tablet   Oral   Take 20 mg by mouth daily.         . ferrous sulfate 325 (65 FE) MG tablet   Oral   Take 325 mg by mouth daily with breakfast.         . fluticasone (FLONASE) 50 MCG/ACT nasal spray   Each Nare   Place 2 sprays into both nostrils daily.         Marland Kitchen levothyroxine (SYNTHROID, LEVOTHROID) 75 MCG tablet   Oral   Take 75 mcg by mouth daily before breakfast.         . lubiprostone (AMITIZA) 8 MCG capsule   Oral   Take 8 mcg by mouth 2 (two) times daily with a meal.          . omeprazole (PRILOSEC) 40 MG capsule   Oral   Take 40 mg by mouth daily.         . potassium chloride 20 MEQ TBCR   Oral   Take 10 mEq by mouth once.   7 tablet   0   . traMADol (ULTRAM) 50 MG tablet   Oral   Take 1 tablet (50 mg total) by mouth every 6 (six) hours as needed.   20 tablet   0     Allergies Amlodipine  No family history on file.  Social History Social History  Substance Use Topics  . Smoking status: Never Smoker   . Smokeless tobacco: None  . Alcohol Use: No    Review of Systems Constitutional: No fever/chills Cardiovascular: Denies chest pain. Respiratory: Denies shortness of breath. Gastrointestinal: No abdominal pain.  No nausea, no vomiting. Musculoskeletal: Chronic neck pain. Chronic right shoulder pain. Skin: Negative for rash. Neurological: Negative for headaches, focal weakness or numbness.  10-point ROS otherwise negative.  ____________________________________________   PHYSICAL EXAM:  VITAL SIGNS: ED Triage Vitals  Enc Vitals Group     BP 06/14/15 1256 126/71 mmHg     Pulse Rate 06/14/15 1256 55     Resp 06/14/15 1256 20     Temp 06/14/15 1256 97.9 F (36.6 C)     Temp Source 06/14/15 1256 Oral     SpO2 06/14/15 1256 94 %     Weight 06/14/15 1257 172 lb (78.019 kg)     Height 06/14/15 1257 5\' 4"  (1.626 m)     Head Cir --      Peak Flow --      Pain Score 06/14/15 1256 9     Pain Loc --      Pain Edu? --      Excl. in [place]? --     Constitutional: Alert and oriented. Well appearing and in no acute distress. Eyes: Conjunctivae are normal. PERRL. EOMI. Head: Atraumatic. Nose: No congestion/rhinnorhea. Neck: No stridor.  No gross deformities noted of the posterior neck. Range of motion is minimally restricted laterally secondary to discomfort. There is moderate tenderness on palpation trapezius/ cervical muscles bilaterally. Range of motion in the upper extremities is without restriction. Grip strength is equal  bilaterally. Cardiovascular: Normal rate, regular rhythm. Grossly normal heart sounds.  Good peripheral circulation. Respiratory: Normal respiratory effort.  No retractions. Lungs CTAB. Gastrointestinal: Soft and nontender. No distention. No abdominal bruits. No CVA tenderness. Musculoskeletal: Examination of the right shoulder there is no gross deformity noted. Range of motion is restricted secondary discomfort. Patient was able to abduct and extend. No crepitus was noted with range of motion. Neurologic:  Normal speech and language. No gross focal neurologic deficits are appreciated. No gait instability. Skin:  Skin is warm, dry and intact. No rash noted. Psychiatric: Mood and affect are normal. Speech and behavior are normal.  ____________________________________________   LABS (all labs ordered are listed, but only abnormal results are displayed)  Labs Reviewed  URINALYSIS COMPLETEWITH MICROSCOPIC ([place] ONLY) - Abnormal; Notable for the following:    Color, Urine YELLOW (*)    APPearance CLEAR (*)    Squamous Epithelial / LPF 0-5 (*)    All other components within normal limits    PROCEDURES  Procedure(s) performed: None  Critical Care performed: No  ____________________________________________   INITIAL IMPRESSION / ASSESSMENT AND PLAN / ED COURSE  Pertinent labs & imaging results that were available during my care of the patient were reviewed by me and considered in my medical decision making (see chart for details).  Medical records from [facility] were reviewed. Patient did receive Kenalog injection into the bursa right shoulder. 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  Patient had taken tramadol in the past but is not currently taking this medication. She is to follow-up with Dr. Humphrey Rolls and [facility] for her continued chronic pain. Today she was given a prescription for tramadol 50 mg 1 every 6 hours as needed for pain. She is encouraged to  call and make an appointment next week. ____________________________________________   FINAL CLINICAL IMPRESSION(S) / ED DIAGNOSES  Final diagnoses:  Chronic cervical pain  Chronic right shoulder pain      Johnn Hai, PA-C 06/14/15 [place], PA-C 06/14/15 1514  Lavonia Drafts, MD 06/14/15 1517

## 2015-06-25 DIAGNOSIS — I10 Essential (primary) hypertension: Secondary | ICD-10-CM | POA: Diagnosis not present

## 2015-06-25 DIAGNOSIS — J309 Allergic rhinitis, unspecified: Secondary | ICD-10-CM | POA: Diagnosis not present

## 2015-07-09 DIAGNOSIS — R05 Cough: Secondary | ICD-10-CM | POA: Diagnosis not present

## 2015-07-09 DIAGNOSIS — J309 Allergic rhinitis, unspecified: Secondary | ICD-10-CM | POA: Diagnosis not present

## 2015-07-09 DIAGNOSIS — M5137 Other intervertebral disc degeneration, lumbosacral region: Secondary | ICD-10-CM | POA: Diagnosis not present

## 2015-07-15 ENCOUNTER — Emergency Department: Payer: Medicare Other

## 2015-07-15 ENCOUNTER — Encounter: Payer: Self-pay | Admitting: Emergency Medicine

## 2015-07-15 ENCOUNTER — Emergency Department
Admission: EM | Admit: 2015-07-15 | Discharge: 2015-07-15 | Disposition: A | Discharge status: Home / Self Care | Payer: Medicare Other | Attending: Student | Admitting: Student

## 2015-07-15 DIAGNOSIS — I10 Essential (primary) hypertension: Secondary | ICD-10-CM | POA: Diagnosis not present

## 2015-07-15 DIAGNOSIS — G8929 Other chronic pain: Secondary | ICD-10-CM | POA: Insufficient documentation

## 2015-07-15 DIAGNOSIS — M545 Low back pain: Secondary | ICD-10-CM | POA: Diagnosis not present

## 2015-07-15 DIAGNOSIS — E079 Disorder of thyroid, unspecified: Secondary | ICD-10-CM | POA: Diagnosis not present

## 2015-07-15 DIAGNOSIS — Z79899 Other long term (current) drug therapy: Secondary | ICD-10-CM | POA: Diagnosis not present

## 2015-07-15 DIAGNOSIS — M25511 Pain in right shoulder: Secondary | ICD-10-CM | POA: Diagnosis not present

## 2015-07-15 DIAGNOSIS — R05 Cough: Secondary | ICD-10-CM | POA: Diagnosis not present

## 2015-07-15 DIAGNOSIS — R109 Unspecified abdominal pain: Secondary | ICD-10-CM | POA: Diagnosis not present

## 2015-07-15 DIAGNOSIS — M5412 Radiculopathy, cervical region: Secondary | ICD-10-CM | POA: Insufficient documentation

## 2015-07-15 DIAGNOSIS — M549 Dorsalgia, unspecified: Secondary | ICD-10-CM | POA: Diagnosis present

## 2015-07-15 DIAGNOSIS — R059 Cough, unspecified: Secondary | ICD-10-CM

## 2015-07-15 LAB — URINALYSIS COMPLETE WITH MICROSCOPIC (ARMC ONLY)
Bacteria, UA: NONE SEEN
Bilirubin Urine: NEGATIVE
Glucose, UA: NEGATIVE mg/dL
Hgb urine dipstick: NEGATIVE
Ketones, ur: NEGATIVE mg/dL
Leukocytes, UA: NEGATIVE
Nitrite: NEGATIVE
Protein, ur: NEGATIVE mg/dL
Specific Gravity, Urine: 1.019 (ref 1.005–1.030)
pH: 5 (ref 5.0–8.0)

## 2015-07-15 LAB — CBC
HCT: 34.8 % — ABNORMAL LOW (ref 35.0–47.0)
Hemoglobin: 11.2 g/dL — ABNORMAL LOW (ref 12.0–16.0)
MCH: 24 pg — ABNORMAL LOW (ref 26.0–34.0)
MCHC: 32.1 g/dL (ref 32.0–36.0)
MCV: 74.9 fL — ABNORMAL LOW (ref 80.0–100.0)
Platelets: 187 10*3/uL (ref 150–440)
RBC: 4.64 MIL/uL (ref 3.80–5.20)
RDW: 16 % — ABNORMAL HIGH (ref 11.5–14.5)
WBC: 5.5 10*3/uL (ref 3.6–11.0)

## 2015-07-15 LAB — COMPREHENSIVE METABOLIC PANEL
ALT: 35 U/L (ref 14–54)
AST: 37 U/L (ref 15–41)
Albumin: 3.8 g/dL (ref 3.5–5.0)
Alkaline Phosphatase: 113 U/L (ref 38–126)
Anion gap: 7 (ref 5–15)
BUN: 19 mg/dL (ref 6–20)
CO2: 23 mmol/L (ref 22–32)
Calcium: 8.9 mg/dL (ref 8.9–10.3)
Chloride: 107 mmol/L (ref 101–111)
Creatinine, Ser: 0.78 mg/dL (ref 0.44–1.00)
GFR calc Af Amer: >60 mL/min (ref >60)
GFR calc non Af Amer: >60 mL/min (ref >60)
Glucose, Bld: 122 mg/dL — ABNORMAL HIGH (ref 65–99)
Potassium: 3.8 mmol/L (ref 3.5–5.1)
Sodium: 137 mmol/L (ref 135–145)
Total Bilirubin: 0.6 mg/dL (ref 0.3–1.2)
Total Protein: 7.2 g/dL (ref 6.5–8.1)

## 2015-07-15 LAB — TROPONIN I: Troponin I: <0.03 ng/mL (ref <0.031)

## 2015-07-15 LAB — LIPASE, BLOOD: Lipase: 15 U/L (ref 11–51)

## 2015-07-15 MED ORDER — OXYCODONE HCL 5 MG PO TABS
5.0000 mg | ORAL_TABLET | Freq: Once | ORAL | Status: AC
  Administered 2015-07-15: 5 mg via ORAL
  Filled 2015-07-15: qty 1

## 2015-07-15 MED ORDER — BENZONATATE 100 MG PO CAPS: 100.0000 mg | ORAL_CAPSULE | Freq: Three times a day (TID) | ORAL | Status: DC | PRN

## 2015-07-15 NOTE — ED Provider Notes (Signed)
[facility] Emergency Department Provider Note  ____________________________________________  Time seen: Approximately 9:08 AM  I have reviewed the triage vital signs and the nursing notes.   HISTORY  Chief Complaint Back Pain    HPI Helen Mccormick is a 75 y.o. female with history of hypertension, thyroid disease, chronic right shoulder and neck pain to cervical radiculopathy followed by [facility] physical medicine and rehabilitation who presents for evaluation of worsening right shoulder/neck pain over the past 3 days, gradual onset, constant since onset, worse with movement. Patient reports that this is the same pain that she has had since October 2016, however it feels worse. She reports that she had to take care of a small grand child over the weekend and that required a lot of lifting and her pain has been worse since then. She is also complaining of cough which is been ongoing for several weeks. She was unable to fill a cough medication given to her by her doctor. She reports that her cough is so severe that she sometimes will vomit after the cough, she reports that last week she coughed so hard that she "pooped a little". No other bowel or bladder incontinence. No numbness or weakness in the legs, no saddle anesthesia. She is complaining of some left flank pain as well. She thinks this may be secondary to a support belt that she was wearing over the weekend "help with my scoliosis". No chest pain. No difficulty breathing. No abdominal pain. She has had constipation.   Past Medical History  Diagnosis Date  . Hypertension   . Thyroid disease   . Heart murmur     There are no active problems to display for this patient.   Past Surgical History  Procedure Laterality Date  . Abdominal hysterectomy    . Back surgery    . Thyroid surgery    . Cervical fusion      Current Outpatient Rx  Name  Route  Sig  Dispense  Refill  . atenolol (TENORMIN) 50 MG tablet    Oral   Take 50 mg by mouth daily.         Marland Kitchen atorvastatin (LIPITOR) 20 MG tablet   Oral   Take 20 mg by mouth at bedtime.          Marland Kitchen escitalopram (LEXAPRO) 20 MG tablet   Oral   Take 20 mg by mouth daily.         . ferrous sulfate 325 (65 FE) MG tablet   Oral   Take 325 mg by mouth daily with breakfast.         . fluticasone (FLONASE) 50 MCG/ACT nasal spray   Each Nare   Place 2 sprays into both nostrils daily.         Marland Kitchen levothyroxine (SYNTHROID, LEVOTHROID) 75 MCG tablet   Oral   Take 75 mcg by mouth daily before breakfast.         . lubiprostone (AMITIZA) 8 MCG capsule   Oral   Take 8 mcg by mouth 2 (two) times daily with a meal.         . omeprazole (PRILOSEC) 40 MG capsule   Oral   Take 40 mg by mouth daily.         . potassium chloride 20 MEQ TBCR   Oral   Take 10 mEq by mouth once.   7 tablet   0   . traMADol (ULTRAM) 50 MG tablet   Oral  Take 1 tablet (50 mg total) by mouth every 6 (six) hours as needed.   20 tablet   0     Allergies Amlodipine  No family history on file.  Social History Social History  Substance Use Topics  . Smoking status: Never Smoker   . Smokeless tobacco: None  . Alcohol Use: No    Review of Systems Constitutional: No fever/chills Eyes: No visual changes. ENT: No sore throat. Cardiovascular: Denies chest pain. Respiratory: Denies shortness of breath. Gastrointestinal: No abdominal pain.  No nausea, +posttussive vomiting.  No diarrhea.  No constipation. Genitourinary: Negative for dysuria. Musculoskeletal: Positive for left flank pain. Skin: Negative for rash. Neurological: Negative for headaches, focal weakness or numbness.  10-point ROS otherwise negative.  ____________________________________________   PHYSICAL EXAM:  Filed Vitals:   07/15/15 0915  BP: 154/68  Pulse: 57  Temp: 97.6 F (36.4 C)  TempSrc: Oral  Resp: 18  Height: 5\' 4"  (1.626 m)  Weight: 170 lb (77.111 kg)  SpO2: 97%       Constitutional: Alert and oriented. Well appearing and in no acute distress. Eyes: Conjunctivae are normal. PERRL. EOMI. Head: Atraumatic. Nose: No congestion/rhinnorhea. Mouth/Throat: Mucous membranes are moist.  Oropharynx non-erythematous. Neck: No stridor.  No cervical spine tenderness to palpation.Mild tenderness to palpation in the right or spinal muscles of the C-spine. Mild tenderness in the right trapezius.  Cardiovascular: Normal rate, regular rhythm. Grossly normal heart sounds.  Good peripheral circulation. Respiratory: Normal respiratory effort.  No retractions. Lungs CTAB. Gastrointestinal: Soft and nontender. No distention.  No CVA tenderness. Genitourinary: deferred Musculoskeletal: No lower extremity tenderness nor edema.  No joint effusions. Neurologic:  Normal speech and language. No gross focal neurologic deficits are appreciated. No gait instability. 5 out of 5 strength in bilateral lower extremity, normal strength of dorsiflexion of the big toes bilaterally, no saddle anesthesia. 5 out of 5 strength in bilateral upper extremities. Full active range of motion in the right shoulder though rotation does cause some pain. 2+ right radial pulse. Skin:  Skin is warm, dry and intact. No rash noted. Psychiatric: Mood and affect are normal. Speech and behavior are normal.  ____________________________________________   LABS (all labs ordered are listed, but only abnormal results are displayed)  Labs Reviewed  CBC - Abnormal; Notable for the following:    Hemoglobin 11.2 (*)    HCT 34.8 (*)    MCV 74.9 (*)    MCH 24.0 (*)    RDW 16.0 (*)    All other components within normal limits  COMPREHENSIVE METABOLIC PANEL - Abnormal; Notable for the following:    Glucose, Bld 122 (*)    All other components within normal limits  URINALYSIS COMPLETEWITH MICROSCOPIC ([facility] ONLY) - Abnormal; Notable for the following:    Color, Urine YELLOW (*)    APPearance CLEAR (*)     Squamous Epithelial / LPF 0-5 (*)    All other components within normal limits  TROPONIN I  LIPASE, BLOOD   ____________________________________________  EKG  ED ECG REPORT I, Joanne Gavel, the attending physician, personally viewed and interpreted this ECG.   Date: 07/15/2015  EKG Time: 09:11  Rate: 60  Rhythm: normal sinus rhythm  Axis: normal  Intervals:left bundle branch block  ST&T Change: No acute ST elevation. EKG unchanged from 05/18/2014.  ____________________________________________  RADIOLOGY  3 view abdominal plain films IMPRESSION: No acute abnormality.  ____________________________________________   PROCEDURES  Procedure(s) performed: None  Critical Care performed: No  ____________________________________________  INITIAL IMPRESSION / ASSESSMENT AND PLAN / ED COURSE  Pertinent labs & imaging results that were available during my care of the patient were reviewed by me and considered in my medical decision making (see chart for details).  Helen Mccormick [place] is a 75 y.o. female with history of hypertension, thyroid disease, chronic right shoulder and neck pain to cervical radiculopathy followed by [facility] physical medicine and rehabilitation who presents for evaluation of worsening right shoulder/neck pain over the past 3 days. On exam, she is very well-appearing in no acute distress. Vital signs stable, she is afebrile. Exam consistent with exacerbation of her chronic right shoulder pain secondary to cervical radiculopathy. She is neurovascular intact in the right arm, normal grip strength. Neurovascularly intact in the lower extremities. Doubt cauda equina or epidural abscess. Plan for screening labs though I doubt this represents anginal equivalent, we'll treat her pain. Reassess for disposition.  ----------------------------------------- 12:09 PM on 07/15/2015 -----------------------------------------  3 view abdominal plain films show no acute  abnormality. Negative troponin. EKG unchanged from prior. Normal lipase. CMP unremarkable. Urinalysis consistent with infection. CBC chronic stable anemia. Patient reports improvement of her pain at this time she is resting comfortably. I discussed with her that she needs to follow-up with her primary care doctor and her PMR doctor regarding her chronic right shoulder and neck pain. At this point, she may also benefit from being seen at a pain clinic. We discussed return precautions and she is comfortable with the discharge plan. ____________________________________________   FINAL CLINICAL IMPRESSION(S) / ED DIAGNOSES  Final diagnoses:  Chronic shoulder pain, right  Cervical radiculopathy  Cough      Joanne Gavel, MD 07/15/15 1213

## 2015-07-15 NOTE — ED Notes (Signed)
Patinet states has had R shoulder, back and L side pain x 2 months, worse this am, no known injury.

## 2015-07-18 DIAGNOSIS — M542 Cervicalgia: Secondary | ICD-10-CM | POA: Diagnosis not present

## 2015-07-18 DIAGNOSIS — M25572 Pain in left ankle and joints of left foot: Secondary | ICD-10-CM | POA: Diagnosis not present

## 2015-07-18 DIAGNOSIS — M5137 Other intervertebral disc degeneration, lumbosacral region: Secondary | ICD-10-CM | POA: Diagnosis not present

## 2015-08-16 ENCOUNTER — Telehealth: Payer: Self-pay | Admitting: *Deleted

## 2015-08-16 NOTE — Telephone Encounter (Signed)
lm on vm making the pt aware her appt for 09/27/15 has been cancelled. made her aware that she was declined by 2 doctors and asked her if she wanted me to reach out to Dr? Dossie Arbour. waiting on return call...td

## 2015-08-20 DIAGNOSIS — M7062 Trochanteric bursitis, left hip: Secondary | ICD-10-CM | POA: Diagnosis not present

## 2015-08-20 DIAGNOSIS — M7061 Trochanteric bursitis, right hip: Secondary | ICD-10-CM | POA: Diagnosis not present

## 2015-08-20 DIAGNOSIS — M5126 Other intervertebral disc displacement, lumbar region: Secondary | ICD-10-CM | POA: Diagnosis not present

## 2015-08-27 DIAGNOSIS — J209 Acute bronchitis, unspecified: Secondary | ICD-10-CM | POA: Diagnosis not present

## 2015-09-17 ENCOUNTER — Other Ambulatory Visit: Payer: Self-pay | Admitting: Physician Assistant

## 2015-09-17 ENCOUNTER — Other Ambulatory Visit: Payer: Self-pay | Admitting: Internal Medicine

## 2015-09-17 DIAGNOSIS — Z1231 Encounter for screening mammogram for malignant neoplasm of breast: Secondary | ICD-10-CM

## 2015-09-27 ENCOUNTER — Ambulatory Visit: Payer: Self-pay | Admitting: Anesthesiology

## 2015-10-03 ENCOUNTER — Other Ambulatory Visit: Payer: Self-pay | Admitting: Internal Medicine

## 2015-10-03 ENCOUNTER — Ambulatory Visit
Admission: RE | Admit: 2015-10-03 | Discharge: 2015-10-03 | Disposition: A | Discharge status: Home / Self Care | Payer: Medicare Other | Source: Ambulatory Visit | Attending: Internal Medicine | Admitting: Internal Medicine

## 2015-10-03 DIAGNOSIS — Z1231 Encounter for screening mammogram for malignant neoplasm of breast: Secondary | ICD-10-CM | POA: Diagnosis not present

## 2015-10-07 DIAGNOSIS — M5136 Other intervertebral disc degeneration, lumbar region: Secondary | ICD-10-CM | POA: Diagnosis not present

## 2015-10-07 DIAGNOSIS — M7061 Trochanteric bursitis, right hip: Secondary | ICD-10-CM | POA: Diagnosis not present

## 2015-10-07 DIAGNOSIS — M7062 Trochanteric bursitis, left hip: Secondary | ICD-10-CM | POA: Diagnosis not present

## 2015-10-07 DIAGNOSIS — M5126 Other intervertebral disc displacement, lumbar region: Secondary | ICD-10-CM | POA: Diagnosis not present

## 2015-10-07 DIAGNOSIS — M5416 Radiculopathy, lumbar region: Secondary | ICD-10-CM | POA: Diagnosis not present

## 2015-10-14 DIAGNOSIS — J069 Acute upper respiratory infection, unspecified: Secondary | ICD-10-CM | POA: Diagnosis not present

## 2015-10-20 ENCOUNTER — Encounter: Payer: Self-pay | Admitting: Emergency Medicine

## 2015-10-20 ENCOUNTER — Emergency Department: Payer: Medicare Other

## 2015-10-20 ENCOUNTER — Emergency Department
Admission: EM | Admit: 2015-10-20 | Discharge: 2015-10-20 | Disposition: A | Discharge status: Home / Self Care | Payer: Medicare Other | Attending: Emergency Medicine | Admitting: Emergency Medicine

## 2015-10-20 DIAGNOSIS — M940 Chondrocostal junction syndrome [Tietze]: Secondary | ICD-10-CM | POA: Insufficient documentation

## 2015-10-20 DIAGNOSIS — I10 Essential (primary) hypertension: Secondary | ICD-10-CM | POA: Insufficient documentation

## 2015-10-20 DIAGNOSIS — R0781 Pleurodynia: Secondary | ICD-10-CM | POA: Diagnosis not present

## 2015-10-20 DIAGNOSIS — R053 Chronic cough: Secondary | ICD-10-CM

## 2015-10-20 DIAGNOSIS — R05 Cough: Secondary | ICD-10-CM

## 2015-10-20 MED ORDER — NAPROXEN 500 MG PO TABS: 500.0000 mg | ORAL_TABLET | Freq: Two times a day (BID) | ORAL | 0 refills | Status: DC

## 2015-10-20 NOTE — ED Triage Notes (Signed)
Pt presents with left side rib pain, was seen by dr on last Monday and was givend prednisone and cefinir, pt states she is not any better. Pt states has had a chronic cough.

## 2015-10-20 NOTE — ED Provider Notes (Signed)
[facility] Emergency Department Provider Note  ____________________________________________  Time seen: Approximately 11:44 AM  I have reviewed the triage vital signs and the nursing notes.   HISTORY  Chief Complaint Cough (rib pain)    HPI Helen Mccormick is a 75 y.o. female , NAD, presents to the emergency department with 1 year history of cough and left lower rib pain. Patient states she has been seeing her primary care provider over the last year in regards to her chronic cough and rib pain. States she has an appointment tomorrow for a chest x-rays but stated the rib pain was so bad today she presented to the emergency department. States she was placed on prednisone and an antibiotic last week when she saw her primary care provider. Has completed a course of prednisone but is still on the antibiotic. Denies any fevers, chills, body aches. His not had any neck or back pain. States that her ribs do not hurt when she coughs but more when she lays on that side or touches the area. Has been applying cool compress which is not helping her pain. Does not note any change since starting prednisone or the antibiotic. Denies any injuries, traumas, falls. Has not had chest pain, shortness of breath, wheezing, nasal congestion, runny nose, sneezing, sinus pressure, ear pain. Denies abdominal pain, nausea, vomiting.   Past Medical History:  Diagnosis Date  . Heart murmur   . Hypertension   . Thyroid disease     There are no active problems to display for this patient.   Past Surgical History:  Procedure Laterality Date  . ABDOMINAL HYSTERECTOMY    . BACK SURGERY    . CERVICAL FUSION    . THYROID SURGERY      Current Outpatient Rx  . Order #: [id number] Class: Historical Med  . Order #: [id number] Class: Historical Med  . Order #: [id number] Class: Print  . Order #: [id number] Class: Historical Med  . Order #: [id number] Class: Historical Med  . Order #:  [id number] Class: Historical Med  . Order #: [id number] Class: Historical Med  . Order #: [id number] Class: Historical Med  . Order #: [id number] Class: Print  . Order #: [id number] Class: Historical Med  . Order #: [id number] Class: Print  . Order #: [id number] Class: Print    Allergies Amlodipine  Family History  Problem Relation Age of Onset  . Breast cancer Maternal Aunt 60    Social History Social History  Substance Use Topics  . Smoking status: Never Smoker  . Smokeless tobacco: Never Used  . Alcohol use No     Review of Systems  Constitutional: No fever/chills Eyes: No visual changes.  ENT: No sore throat, Nasal congestion, runny nose, sinus pressure, ear pain. Cardiovascular: No chest pain. Respiratory: Positive chronic cough. No chest congestion, shortness of breath. No wheezing.  Gastrointestinal: No abdominal pain.  No nausea, vomiting.   Musculoskeletal: Positive for left lower rib pain. Negative for back, neck pain.  Skin: Negative for rash, redness, swelling, skin sores or open wounds. Neurological: Negative for headaches, focal weakness or numbness. No tingling 10-point ROS otherwise negative.  ____________________________________________   PHYSICAL EXAM:  VITAL SIGNS: ED Triage Vitals  Enc Vitals Group     BP 10/20/15 1057 (!) 160/73     Pulse Rate 10/20/15 1057 (!) 56     Resp 10/20/15 1057 17     Temp 10/20/15 1057 98 F (36.7 C)     Temp Source 10/20/15 1057 Oral     SpO2 10/20/15 1057 96 %  Weight 10/20/15 1057 173 lb (78.5 kg)     Height 10/20/15 1057 5\' 4"  (1.626 m)     Head Circumference --      Peak Flow --      Pain Score 10/20/15 1112 8     Pain Loc --      Pain Edu? --      Excl. in [place]? --      Constitutional: Alert and oriented. Well appearing and in no acute distress. Eyes: Conjunctivae are normal. Head: Atraumatic. ENT:      Nose: No congestion/rhinnorhea.      Mouth/Throat: Mucous membranes are moist.  Neck: Supple with full  range of motion. Hematological/Lymphatic/Immunilogical: No cervical lymphadenopathy. Cardiovascular: Normal rate, regular rhythm. Grossly normal heart sounds. Good peripheral circulation. Respiratory: Normal respiratory effort without tachypnea or retractions. Lungs CTAB with breath sounds noted in all lung fields. No rhonchi, wheeze, rales. Gastrointestinal: Soft and nontender. No distention. No CVA tenderness. Musculoskeletal: Tenderness to palpation about the left lateral, lower rib cage. No bony abnormalities or step-offs nor crepitus noted with palpation of the area. No tenderness to palpation about the back.  Neurologic:  Normal speech and language. No gross focal neurologic deficits are appreciated. Gait and posture are normal. Skin:  Skin is warm, dry and intact. No rash, redness, swelling, skin sores noted. Psychiatric: Mood and affect are normal. Speech and behavior are normal. Patient exhibits appropriate insight and judgement.   ____________________________________________   LABS  None ____________________________________________  EKG  None ____________________________________________  RADIOLOGY I have personally viewed and evaluated these images (plain radiographs) as part of my medical decision making, as well as reviewing the written report by the radiologist.  Dg Chest 2 View  Result Date: 10/20/2015 CLINICAL DATA:  Left-sided rib pain, cough. Chronic cough for a year. Pain from coughing. EXAM: CHEST  2 VIEW LEFT RIBS TWO VIEW COMPARISON:  Chest x-ray dated 07/15/2015. FINDINGS: Heart size is upper normal, stable. Overall cardiomediastinal silhouette remains normal in size and configuration. Lungs are clear. No pleural effusion or pneumothorax seen. Osseous structures about the chest are unremarkable. Mild degenerative spurring noted within the slightly scoliotic thoracolumbar spine. Two dedicated views of the left ribs show no evidence of rib fracture or displacement.  Overlying soft tissues are unremarkable. IMPRESSION: 1. Lungs are clear and there is no evidence of acute cardiopulmonary abnormality. 2. No left-sided rib fracture. Electronically Signed   By: Franki Cabot M.D.   On: 10/20/2015 11:54  Dg Ribs Unilateral Left  Result Date: 10/20/2015 CLINICAL DATA:  Left-sided rib pain, cough. Chronic cough for a year. Pain from coughing. EXAM: CHEST  2 VIEW LEFT RIBS TWO VIEW COMPARISON:  Chest x-ray dated 07/15/2015. FINDINGS: Heart size is upper normal, stable. Overall cardiomediastinal silhouette remains normal in size and configuration. Lungs are clear. No pleural effusion or pneumothorax seen. Osseous structures about the chest are unremarkable. Mild degenerative spurring noted within the slightly scoliotic thoracolumbar spine. Two dedicated views of the left ribs show no evidence of rib fracture or displacement. Overlying soft tissues are unremarkable. IMPRESSION: 1. Lungs are clear and there is no evidence of acute cardiopulmonary abnormality. 2. No left-sided rib fracture. Electronically Signed   By: Franki Cabot M.D.   On: 10/20/2015 11:54   ____________________________________________    PROCEDURES  Procedure(s) performed: None      Medications - No data to display   ____________________________________________   INITIAL IMPRESSION / ASSESSMENT AND PLAN / ED COURSE  Pertinent labs & imaging results  that were available during my care of the patient were reviewed by me and considered in my medical decision making (see chart for details).  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 Patient will be discharged home with prescriptions for naproxen to take as directed. Patient is advised to complete her antibiotic regimen that she was placed on last week by her primary care provider. May apply warm heat to the left rib area 20 minutes 3-4 times daily to decrease pain. Patient is to follow up with her primary care provider in 48 hours  if symptoms persist past this treatment course. Patient is given ED precautions to return to the ED for any worsening or new symptoms.    ____________________________________________  FINAL CLINICAL IMPRESSION(S) / ED DIAGNOSES  Final diagnoses:  Tietze syndrome      NEW MEDICATIONS STARTED DURING THIS VISIT:  New Prescriptions   NAPROXEN (NAPROSYN) 500 MG TABLET    Take 1 tablet (500 mg total) by mouth 2 (two) times daily with a meal.         Braxton Feathers, PA-C 10/20/15 [place], MD 10/20/15 1556

## 2015-11-16 ENCOUNTER — Emergency Department: Payer: Medicare Other

## 2015-11-16 ENCOUNTER — Encounter: Payer: Self-pay | Admitting: Emergency Medicine

## 2015-11-16 ENCOUNTER — Emergency Department
Admission: EM | Admit: 2015-11-16 | Discharge: 2015-11-16 | Disposition: A | Discharge status: Home / Self Care | Payer: Medicare Other | Attending: Emergency Medicine | Admitting: Emergency Medicine

## 2015-11-16 DIAGNOSIS — R9431 Abnormal electrocardiogram [ECG] [EKG]: Secondary | ICD-10-CM | POA: Diagnosis not present

## 2015-11-16 DIAGNOSIS — R51 Headache: Secondary | ICD-10-CM | POA: Insufficient documentation

## 2015-11-16 DIAGNOSIS — R059 Cough, unspecified: Secondary | ICD-10-CM

## 2015-11-16 DIAGNOSIS — R519 Headache, unspecified: Secondary | ICD-10-CM

## 2015-11-16 DIAGNOSIS — Z79899 Other long term (current) drug therapy: Secondary | ICD-10-CM | POA: Diagnosis not present

## 2015-11-16 DIAGNOSIS — I10 Essential (primary) hypertension: Secondary | ICD-10-CM | POA: Diagnosis not present

## 2015-11-16 DIAGNOSIS — R05 Cough: Secondary | ICD-10-CM | POA: Insufficient documentation

## 2015-11-16 DIAGNOSIS — Z791 Long term (current) use of non-steroidal anti-inflammatories (NSAID): Secondary | ICD-10-CM | POA: Diagnosis not present

## 2015-11-16 LAB — BASIC METABOLIC PANEL
Anion gap: 7 (ref 5–15)
BUN: 26 mg/dL — ABNORMAL HIGH (ref 6–20)
CO2: 27 mmol/L (ref 22–32)
Calcium: 8.5 mg/dL — ABNORMAL LOW (ref 8.9–10.3)
Chloride: 107 mmol/L (ref 101–111)
Creatinine, Ser: 1.02 mg/dL — ABNORMAL HIGH (ref 0.44–1.00)
GFR calc Af Amer: >60 mL/min (ref >60)
GFR calc non Af Amer: 53 mL/min — ABNORMAL LOW (ref >60)
Glucose, Bld: 118 mg/dL — ABNORMAL HIGH (ref 65–99)
Potassium: 3.5 mmol/L (ref 3.5–5.1)
Sodium: 141 mmol/L (ref 135–145)

## 2015-11-16 LAB — URINALYSIS COMPLETE WITH MICROSCOPIC (ARMC ONLY)
Bacteria, UA: NONE SEEN
Bilirubin Urine: NEGATIVE
Glucose, UA: NEGATIVE mg/dL
Hgb urine dipstick: NEGATIVE
Ketones, ur: NEGATIVE mg/dL
Leukocytes, UA: NEGATIVE
Nitrite: NEGATIVE
Protein, ur: NEGATIVE mg/dL
Specific Gravity, Urine: 1.023 (ref 1.005–1.030)
pH: 5 (ref 5.0–8.0)

## 2015-11-16 LAB — CBC
HCT: 36 % (ref 35.0–47.0)
Hemoglobin: 11.9 g/dL — ABNORMAL LOW (ref 12.0–16.0)
MCH: 25.4 pg — ABNORMAL LOW (ref 26.0–34.0)
MCHC: 32.9 g/dL (ref 32.0–36.0)
MCV: 77.3 fL — ABNORMAL LOW (ref 80.0–100.0)
Platelets: 188 10*3/uL (ref 150–440)
RBC: 4.66 MIL/uL (ref 3.80–5.20)
RDW: 16.7 % — ABNORMAL HIGH (ref 11.5–14.5)
WBC: 7.5 10*3/uL (ref 3.6–11.0)

## 2015-11-16 LAB — SEDIMENTATION RATE: Sed Rate: 39 mm/hr — ABNORMAL HIGH (ref 0–30)

## 2015-11-16 MED ORDER — PREDNISONE 20 MG PO TABS
60.0000 mg | ORAL_TABLET | Freq: Once | ORAL | Status: AC
  Administered 2015-11-16: 60 mg via ORAL
  Filled 2015-11-16: qty 3

## 2015-11-16 MED ORDER — IBUPROFEN 600 MG PO TABS
600.0000 mg | ORAL_TABLET | Freq: Once | ORAL | Status: AC
  Administered 2015-11-16: 600 mg via ORAL
  Filled 2015-11-16: qty 1

## 2015-11-16 MED ORDER — PREDNISONE 20 MG PO TABS: 40.0000 mg | ORAL_TABLET | Freq: Every day | ORAL | 0 refills | Status: DC

## 2015-11-16 MED ORDER — ASPIRIN EC 81 MG PO TBEC: 81.0000 mg | DELAYED_RELEASE_TABLET | Freq: Every day | ORAL | 0 refills | Status: AC

## 2015-11-16 NOTE — ED Triage Notes (Signed)
Pt c/o cough for 6 months; has seen her provider for same; put on cough medication and inhaler with no relief; also having a headache to her right temple that radiates behind her eyes to her left temple; pt says she felt hot last night but did not take her temperature; talking in complete coherent sentences;

## 2015-11-16 NOTE — Discharge Instructions (Signed)
Please seek medical attention for any high fevers, chest pain, shortness of breath, change in behavior, persistent vomiting, bloody stool or any other new or concerning symptoms.  

## 2015-11-16 NOTE — ED Notes (Signed)
Blood work was sent to the lab for further analysis, pt was given urine cup and asked to provide sample when able. EKG preformed and signed by Dr. Corky Downs

## 2015-11-16 NOTE — ED Provider Notes (Signed)
[facility] Emergency Department Provider Note    ____________________________________________   I have reviewed the triage vital signs and the nursing notes.   HISTORY  Chief Complaint Headache; Cough; and Weakness   History limited by: Not Limited   HPI Helen Mccormick is a 75 y.o. female who presents to the emergencydepartment today because of concerns for headache and cough. Patient has had a cough for at least the past 6 months. She has been seen here in the emergency department by her primary care doctor for. She states she has been on multiple medications without any great relief. 2 days ago she started developing a headache. She describes as being located in the right temple. It will be intermittent. It will be severe. She does have some accompanied blurred vision. She denies any fevers.    Past Medical History:  Diagnosis Date  . Heart murmur   . Hypertension   . Thyroid disease     There are no active problems to display for this patient.   Past Surgical History:  Procedure Laterality Date  . ABDOMINAL HYSTERECTOMY    . BACK SURGERY    . CERVICAL FUSION    . THYROID SURGERY      Prior to Admission medications   Medication Sig Start Date End Date Taking? Authorizing Provider  atenolol (TENORMIN) 50 MG tablet Take 50 mg by mouth daily.    Historical Provider, MD  atorvastatin (LIPITOR) 20 MG tablet Take 20 mg by mouth at bedtime.     Historical Provider, MD  benzonatate (TESSALON PERLES) 100 MG capsule Take 1 capsule (100 mg total) by mouth 3 (three) times daily as needed for cough. 07/15/15   Joanne Gavel, MD  escitalopram (LEXAPRO) 20 MG tablet Take 20 mg by mouth daily.    Historical Provider, MD  ferrous sulfate 325 (65 FE) MG tablet Take 325 mg by mouth daily with breakfast.    Historical Provider, MD  fluticasone (FLONASE) 50 MCG/ACT nasal spray Place 2 sprays into both nostrils daily.    Historical Provider, MD  levothyroxine  (SYNTHROID, LEVOTHROID) 75 MCG tablet Take 75 mcg by mouth daily before breakfast.    Historical Provider, MD  lubiprostone (AMITIZA) 8 MCG capsule Take 8 mcg by mouth 2 (two) times daily with a meal.    Historical Provider, MD  naproxen (NAPROSYN) 500 MG tablet Take 1 tablet (500 mg total) by mouth 2 (two) times daily with a meal. 10/20/15   Jami L Hagler, PA-C  omeprazole (PRILOSEC) 40 MG capsule Take 40 mg by mouth daily.    Historical Provider, MD  potassium chloride 20 MEQ TBCR Take 10 mEq by mouth once. 02/27/15   Daymon Larsen, MD  traMADol (ULTRAM) 50 MG tablet Take 1 tablet (50 mg total) by mouth every 6 (six) hours as needed. 06/14/15   Johnn Hai, PA-C    Allergies Amlodipine  Family History  Problem Relation Age of Onset  . Breast cancer Maternal Aunt 60    Social History Social History  Substance Use Topics  . Smoking status: Never Smoker  . Smokeless tobacco: Never Used  . Alcohol use No    Review of Systems  Constitutional: Negative for fever. Cardiovascular: Negative for chest pain.  Respiratory: Negative for shortness of breath. Positive for cough. Gastrointestinal: Negative for abdominal pain, vomiting and diarrhea. Genitourinary: Negative for dysuria. Musculoskeletal: Negative for back pain. Skin: Negative for rash. Neurological: Positive for headache.  10-point ROS otherwise negative.  ____________________________________________   PHYSICAL EXAM:  VITAL SIGNS: ED Triage Vitals [11/16/15 1917]  Enc Vitals Group     BP (!) 129/101     Pulse Rate 63     Resp 18     Temp 98.2 F (36.8 C)     Temp Source Oral     SpO2 98 %     Weight 172 lb (78 kg)     Height _0  (1.626 m)     Head Circumference      Peak Flow      Pain Score 9   Constitutional: Alert and oriented. Well appearing and in no distress. Eyes: Conjunctivae are normal. PERRL. Normal extraocular movements. ENT   Head: Normocephalic and atraumatic.   Nose: No  congestion/rhinnorhea.   Mouth/Throat: Mucous membranes are moist.   Neck: No stridor. Hematological/Lymphatic/Immunilogical: No cervical lymphadenopathy. Cardiovascular: Normal rate, regular rhythm.  No murmurs, rubs, or gallops. Respiratory: Normal respiratory effort without tachypnea nor retractions. Breath sounds are clear and equal bilaterally. No wheezes/rales/rhonchi. Gastrointestinal: Soft and nontender. No distention. There is no CVA tenderness. Genitourinary: Deferred Musculoskeletal: Normal range of motion in all extremities. No joint effusions.  No lower extremity tenderness nor edema. Neurologic:  Normal speech and language. No gross focal neurologic deficits are appreciated.  Skin:  Skin is warm, dry and intact. No rash noted. Psychiatric: Mood and affect are normal. Speech and behavior are normal. Patient exhibits appropriate insight and judgment.  ____________________________________________    LABS (pertinent positives/negatives)  Labs Reviewed  BASIC METABOLIC PANEL - Abnormal; Notable for the following:       Result Value   Glucose, Bld 118 (*)    BUN 26 (*)    Creatinine, Ser 1.02 (*)    Calcium 8.5 (*)    GFR calc non Af Amer 53 (*)    All other components within normal limits  CBC - Abnormal; Notable for the following:    Hemoglobin 11.9 (*)    MCV 77.3 (*)    MCH 25.4 (*)    RDW 16.7 (*)    All other components within normal limits  URINALYSIS COMPLETEWITH MICROSCOPIC ([facility] ONLY) - Abnormal; Notable for the following:    Color, Urine YELLOW (*)    APPearance CLEAR (*)    Squamous Epithelial / LPF 0-5 (*)    All other components within normal limits  SEDIMENTATION RATE - Abnormal; Notable for the following:    Sed Rate 39 (*)    All other components within normal limits     ____________________________________________   EKG  I, Nance Pear, attending physician, personally viewed and interpreted this EKG  EKG Time: 1929 Rate:  57 Rhythm: sinus bradycardia Axis: left axis deviation Intervals: qtc 476 QRS: LBBB ST changes: no st elevation equavalent Impression: abnormal ekg   ____________________________________________    RADIOLOGY  CXR IMPRESSION: No active cardiopulmonary disease.  CT head IMPRESSION:  Chronic white matter changes. No acute intracranial abnormalities.    ____________________________________________   PROCEDURES  Procedures  ____________________________________________   INITIAL IMPRESSION / ASSESSMENT AND PLAN / ED COURSE  Pertinent labs & imaging results that were available during my care of the patient were reviewed by me and considered in my medical decision making (see chart for details).  Patient presented to the emergency department today primarily because of concerns for headache located around her right temple. Additionally patient has had a chronic cough. Terms of her cough chest x-ray negative. Unclear etiology and I did instruct patient to follow-up  with primary care. Recurrence of the headache will check an ESR. Additionally will get a head CT.  Clinical Course   Head CT negative. ESR minimally elevated. Although I do have a low suspicion for temporal arteritis will start the patient on steroids and have patient follow up with ophthalmologist.  ____________________________________________   FINAL CLINICAL IMPRESSION(S) / ED DIAGNOSES  Final diagnoses:  Headache, unspecified headache type  Cough     Note: This dictation was prepared with Dragon dictation. Any transcriptional errors that result from this process are unintentional    Nance Pear, MD 11/16/15 [phone]

## 2015-11-16 NOTE — ED Notes (Signed)

## 2015-11-16 NOTE — ED Notes (Signed)
Patient transported to CT 

## 2015-11-20 DIAGNOSIS — G4733 Obstructive sleep apnea (adult) (pediatric): Secondary | ICD-10-CM | POA: Diagnosis not present

## 2015-12-09 ENCOUNTER — Encounter: Payer: Self-pay | Admitting: Emergency Medicine

## 2015-12-09 ENCOUNTER — Emergency Department
Admission: EM | Admit: 2015-12-09 | Discharge: 2015-12-09 | Disposition: A | Discharge status: Home / Self Care | Payer: Medicare Other | Attending: Student in an Organized Health Care Education/Training Program | Admitting: Student in an Organized Health Care Education/Training Program

## 2015-12-09 ENCOUNTER — Emergency Department: Payer: Medicare Other

## 2015-12-09 DIAGNOSIS — R51 Headache: Secondary | ICD-10-CM | POA: Diagnosis not present

## 2015-12-09 DIAGNOSIS — M47812 Spondylosis without myelopathy or radiculopathy, cervical region: Secondary | ICD-10-CM | POA: Insufficient documentation

## 2015-12-09 DIAGNOSIS — R0981 Nasal congestion: Secondary | ICD-10-CM | POA: Insufficient documentation

## 2015-12-09 DIAGNOSIS — I10 Essential (primary) hypertension: Secondary | ICD-10-CM | POA: Diagnosis not present

## 2015-12-09 DIAGNOSIS — R0781 Pleurodynia: Secondary | ICD-10-CM | POA: Diagnosis not present

## 2015-12-09 DIAGNOSIS — E039 Hypothyroidism, unspecified: Secondary | ICD-10-CM | POA: Insufficient documentation

## 2015-12-09 DIAGNOSIS — Z7982 Long term (current) use of aspirin: Secondary | ICD-10-CM | POA: Insufficient documentation

## 2015-12-09 DIAGNOSIS — M542 Cervicalgia: Secondary | ICD-10-CM | POA: Diagnosis not present

## 2015-12-09 DIAGNOSIS — M5030 Other cervical disc degeneration, unspecified cervical region: Secondary | ICD-10-CM | POA: Diagnosis not present

## 2015-12-09 DIAGNOSIS — M4322 Fusion of spine, cervical region: Secondary | ICD-10-CM | POA: Diagnosis not present

## 2015-12-09 MED ORDER — PSEUDOEPHEDRINE HCL 30 MG PO TABS: 30.0000 mg | ORAL_TABLET | Freq: Three times a day (TID) | ORAL | 0 refills | Status: AC | PRN

## 2015-12-09 MED ORDER — TRAMADOL HCL 50 MG PO TABS: 50.0000 mg | ORAL_TABLET | Freq: Two times a day (BID) | ORAL | 0 refills | Status: DC | PRN

## 2015-12-09 NOTE — ED Provider Notes (Signed)
[facility] Emergency Department Provider Note   ____________________________________________   None    (approximate)  I have reviewed the triage vital signs and the nursing notes.   HISTORY  Chief Complaint Neck Pain; Headache; and Facial Swelling    HPI Helen Mccormick is a 75 y.o. female patient complaining of left headache, facial swelling and left earache for one day.Patient also complaining of neck, back, and left rib pain for the same amount of time. Patient denies any chest pain or dyspnea. Patient said movement the neck increases the pain along total spine. Patient has a history of cervical spine fusion. Patient denies any radicular component to her neck or back pain. Patient denies any bladder or bowel dysfunction. Patient rates the pain as 8/10. Patient described the pain as "achy". No palliative measures taken for this complaint.  Past Medical History:  Diagnosis Date  . Heart murmur   . Hypertension   . Thyroid disease     There are no active problems to display for this patient.   Past Surgical History:  Procedure Laterality Date  . ABDOMINAL HYSTERECTOMY    . BACK SURGERY    . CERVICAL FUSION    . THYROID SURGERY      Prior to Admission medications   Medication Sig Start Date End Date Taking? Authorizing Provider  aspirin EC 81 MG tablet Take 1 tablet (81 mg total) by mouth daily. 11/16/15 12/16/15  Nance Pear, MD  atenolol (TENORMIN) 50 MG tablet Take 50 mg by mouth daily.    Historical Provider, MD  atorvastatin (LIPITOR) 20 MG tablet Take 20 mg by mouth at bedtime.     Historical Provider, MD  benzonatate (TESSALON PERLES) 100 MG capsule Take 1 capsule (100 mg total) by mouth 3 (three) times daily as needed for cough. 07/15/15   Joanne Gavel, MD  escitalopram (LEXAPRO) 20 MG tablet Take 20 mg by mouth daily.    Historical Provider, MD  ferrous sulfate 325 (65 FE) MG tablet Take 325 mg by mouth daily with breakfast.     Historical Provider, MD  fluticasone (FLONASE) 50 MCG/ACT nasal spray Place 2 sprays into both nostrils daily.    Historical Provider, MD  levothyroxine (SYNTHROID, LEVOTHROID) 75 MCG tablet Take 75 mcg by mouth daily before breakfast.    Historical Provider, MD  lubiprostone (AMITIZA) 8 MCG capsule Take 8 mcg by mouth 2 (two) times daily with a meal.    Historical Provider, MD  naproxen (NAPROSYN) 500 MG tablet Take 1 tablet (500 mg total) by mouth 2 (two) times daily with a meal. 10/20/15   Jami L Hagler, PA-C  omeprazole (PRILOSEC) 40 MG capsule Take 40 mg by mouth daily.    Historical Provider, MD  potassium chloride 20 MEQ TBCR Take 10 mEq by mouth once. 02/27/15   Daymon Larsen, MD  predniSONE (DELTASONE) 20 MG tablet Take 2 tablets (40 mg total) by mouth daily. 11/16/15   Nance Pear, MD  pseudoephedrine (SUDAFED) 30 MG tablet Take 1 tablet (30 mg total) by mouth every 8 (eight) hours as needed for congestion. 12/09/15 12/08/16  Sable Feil, PA-C  traMADol (ULTRAM) 50 MG tablet Take 1 tablet (50 mg total) by mouth every 6 (six) hours as needed. 06/14/15   Johnn Hai, PA-C  traMADol (ULTRAM) 50 MG tablet Take 1 tablet (50 mg total) by mouth every 12 (twelve) hours as needed for moderate pain. 12/09/15   Sable Feil, PA-C  Allergies Amlodipine  Family History  Problem Relation Age of Onset  . Breast cancer Maternal Aunt 60    Social History Social History  Substance Use Topics  . Smoking status: Never Smoker  . Smokeless tobacco: Never Used  . Alcohol use No    Review of Systems Constitutional: No fever/chills Eyes: No visual changes. ENT: No sore throat.Nasal congestion Cardiovascular: Denies chest pain. Respiratory: Denies shortness of breath. Gastrointestinal: No abdominal pain.  No nausea, no vomiting.  No diarrhea.  No constipation. Genitourinary: Negative for dysuria. Musculoskeletal:Chronic neck and back pain  Skin: Negative for rash. Neurological:  Positive for headaches, but denies focal weakness or numbness. Endocrine:Hypertension hypothyroidism Hematological/Lymphatic: Allergic/Immunilogical: The medication list 10-point ROS otherwise negative.  ____________________________________________   PHYSICAL EXAM:  VITAL SIGNS: ED Triage Vitals   Enc Vitals Group     BP (!) 144/99     Pulse Rate 66     Resp 18     Temp 98 F (36.7 C)     Temp Source Oral     SpO2 99 %     Weight 172 lb (78 kg)     Height 5\' 4"  (1.626 m)     Head Circumference      Peak Flow      Pain Score 8     Pain Loc      Pain Edu?      Excl. in [place]?     Constitutional: Alert and oriented. Well appearing and in no acute distress. Eyes: Conjunctivae are normal. PERRL. EOMI. Head: Atraumatic. Nose: Bilateral maxillary guarding. Bilateral edematous nasal turbinates. Mouth/Throat: Mucous membranes are moist.  Oropharynx non-erythematous. Neck: No stridor.   cervical spine tenderness to palpation C4-C6 Hematological/Lymphatic/Immunilogical: No cervical lymphadenopathy. Cardiovascular: Normal rate, regular rhythm. Grossly normal heart sounds.  Good peripheral circulation. Respiratory: Normal respiratory effort.  No retractions. Lungs CTAB. Gastrointestinal: Soft and nontender. No distention. No abdominal bruits. No CVA tenderness. Musculoskeletal: No lower extremity tenderness nor edema.  No joint effusions. Neurologic:  Normal speech and language. No gross focal neurologic deficits are appreciated. No gait instability. Skin:  Skin is warm, dry and intact. No rash noted. Psychiatric: Mood and affect are normal. Speech and behavior are normal.  ____________________________________________   LABS (all labs ordered are listed, but only abnormal results are displayed)  Labs Reviewed - No data to display ____________________________________________  EKG   ____________________________________________  RADIOLOGY  X-ray showed no changes from  comparative study done 07/20/2013. Patient has a fusion of C3 and C4. 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 ____________________________________________   PROCEDURES  Procedure(s) performed: None  Procedures  Critical Care performed: No  ____________________________________________   INITIAL IMPRESSION / ASSESSMENT AND PLAN / ED COURSE  Pertinent labs & imaging results that were available during my care of the patient were reviewed by me and considered in my medical decision making (see chart for details).  Sinus congestion. Neck and back pain secondary to DJD. Patient given discharge Instructions. Patient given a prescription for Sudafed and tramadol. Patient advised to follow-up with family doctor for continued care.  Clinical Course     ____________________________________________   FINAL CLINICAL IMPRESSION(S) / ED DIAGNOSES  Final diagnoses:  Neck pain  Congestion of paranasal sinus  DJD (degenerative joint disease), cervical      NEW MEDICATIONS STARTED DURING THIS VISIT:  New Prescriptions   PSEUDOEPHEDRINE (SUDAFED) 30 MG TABLET    Take 1 tablet (30 mg total) by mouth every 8 (eight) hours  as needed for congestion.   TRAMADOL (ULTRAM) 50 MG TABLET    Take 1 tablet (50 mg total) by mouth every 12 (twelve) hours as needed for moderate pain.     Note:  This document was prepared using Dragon voice recognition software and may include unintentional dictation errors.    Sable Feil, PA-C 12/09/15 1030    Merlyn Lot, MD 12/09/15 1210

## 2015-12-09 NOTE — ED Notes (Signed)
See triage note  Developed some pain to back of head and moves down spine   Also having some ear pain and facial swelling

## 2015-12-09 NOTE — ED Triage Notes (Addendum)
Pt to ed with c/o left side facial swelling yesterday, better today,  Also earache and headache, and neck pain that radiates down entire spine and left rib pain.  Pt denies chest pain, denies sob.  Pt states any movement makes neck and back pain worse.

## 2015-12-25 DIAGNOSIS — Z23 Encounter for immunization: Secondary | ICD-10-CM | POA: Diagnosis not present

## 2015-12-25 DIAGNOSIS — M5137 Other intervertebral disc degeneration, lumbosacral region: Secondary | ICD-10-CM | POA: Diagnosis not present

## 2015-12-25 DIAGNOSIS — I10 Essential (primary) hypertension: Secondary | ICD-10-CM | POA: Diagnosis not present

## 2015-12-25 DIAGNOSIS — E119 Type 2 diabetes mellitus without complications: Secondary | ICD-10-CM | POA: Diagnosis not present

## 2015-12-25 DIAGNOSIS — M4722 Other spondylosis with radiculopathy, cervical region: Secondary | ICD-10-CM | POA: Diagnosis not present

## 2015-12-31 DIAGNOSIS — Z0001 Encounter for general adult medical examination with abnormal findings: Secondary | ICD-10-CM | POA: Diagnosis not present

## 2015-12-31 DIAGNOSIS — D649 Anemia, unspecified: Secondary | ICD-10-CM | POA: Diagnosis not present

## 2015-12-31 DIAGNOSIS — I10 Essential (primary) hypertension: Secondary | ICD-10-CM | POA: Diagnosis not present

## 2015-12-31 DIAGNOSIS — E782 Mixed hyperlipidemia: Secondary | ICD-10-CM | POA: Diagnosis not present

## 2016-01-24 DIAGNOSIS — H2513 Age-related nuclear cataract, bilateral: Secondary | ICD-10-CM | POA: Diagnosis not present

## 2016-01-27 DIAGNOSIS — D509 Iron deficiency anemia, unspecified: Secondary | ICD-10-CM | POA: Diagnosis not present

## 2016-01-27 DIAGNOSIS — Z0001 Encounter for general adult medical examination with abnormal findings: Secondary | ICD-10-CM | POA: Diagnosis not present

## 2016-01-27 DIAGNOSIS — N390 Urinary tract infection, site not specified: Secondary | ICD-10-CM | POA: Diagnosis not present

## 2016-01-27 DIAGNOSIS — E782 Mixed hyperlipidemia: Secondary | ICD-10-CM | POA: Diagnosis not present

## 2016-01-27 DIAGNOSIS — G4733 Obstructive sleep apnea (adult) (pediatric): Secondary | ICD-10-CM | POA: Diagnosis not present

## 2016-02-27 DIAGNOSIS — G4733 Obstructive sleep apnea (adult) (pediatric): Secondary | ICD-10-CM | POA: Diagnosis not present

## 2016-03-12 DIAGNOSIS — M5416 Radiculopathy, lumbar region: Secondary | ICD-10-CM | POA: Diagnosis not present

## 2016-03-12 DIAGNOSIS — M5126 Other intervertebral disc displacement, lumbar region: Secondary | ICD-10-CM | POA: Diagnosis not present

## 2016-03-27 DIAGNOSIS — M5441 Lumbago with sciatica, right side: Secondary | ICD-10-CM | POA: Diagnosis not present

## 2016-03-27 DIAGNOSIS — E039 Hypothyroidism, unspecified: Secondary | ICD-10-CM | POA: Diagnosis not present

## 2016-03-27 DIAGNOSIS — I10 Essential (primary) hypertension: Secondary | ICD-10-CM | POA: Diagnosis not present

## 2016-03-27 DIAGNOSIS — M25551 Pain in right hip: Secondary | ICD-10-CM | POA: Diagnosis not present

## 2016-04-22 DIAGNOSIS — M7061 Trochanteric bursitis, right hip: Secondary | ICD-10-CM | POA: Diagnosis not present

## 2016-04-22 DIAGNOSIS — M7062 Trochanteric bursitis, left hip: Secondary | ICD-10-CM | POA: Diagnosis not present

## 2016-04-22 DIAGNOSIS — M5136 Other intervertebral disc degeneration, lumbar region: Secondary | ICD-10-CM | POA: Diagnosis not present

## 2016-04-22 DIAGNOSIS — M5126 Other intervertebral disc displacement, lumbar region: Secondary | ICD-10-CM | POA: Diagnosis not present

## 2016-04-22 DIAGNOSIS — M5416 Radiculopathy, lumbar region: Secondary | ICD-10-CM | POA: Diagnosis not present

## 2016-04-28 DIAGNOSIS — I6523 Occlusion and stenosis of bilateral carotid arteries: Secondary | ICD-10-CM | POA: Diagnosis not present

## 2016-04-28 DIAGNOSIS — M5441 Lumbago with sciatica, right side: Secondary | ICD-10-CM | POA: Diagnosis not present

## 2016-04-28 DIAGNOSIS — I2510 Atherosclerotic heart disease of native coronary artery without angina pectoris: Secondary | ICD-10-CM | POA: Diagnosis not present

## 2016-04-28 DIAGNOSIS — I10 Essential (primary) hypertension: Secondary | ICD-10-CM | POA: Diagnosis not present

## 2016-05-04 DIAGNOSIS — E782 Mixed hyperlipidemia: Secondary | ICD-10-CM | POA: Diagnosis not present

## 2016-05-05 DIAGNOSIS — J4520 Mild intermittent asthma, uncomplicated: Secondary | ICD-10-CM | POA: Diagnosis not present

## 2016-05-05 DIAGNOSIS — J3089 Other allergic rhinitis: Secondary | ICD-10-CM | POA: Diagnosis not present

## 2016-05-05 DIAGNOSIS — J3081 Allergic rhinitis due to animal (cat) (dog) hair and dander: Secondary | ICD-10-CM | POA: Diagnosis not present

## 2016-05-05 DIAGNOSIS — J301 Allergic rhinitis due to pollen: Secondary | ICD-10-CM | POA: Diagnosis not present

## 2016-05-13 DIAGNOSIS — I2510 Atherosclerotic heart disease of native coronary artery without angina pectoris: Secondary | ICD-10-CM | POA: Diagnosis not present

## 2016-05-25 DIAGNOSIS — G4733 Obstructive sleep apnea (adult) (pediatric): Secondary | ICD-10-CM | POA: Diagnosis not present

## 2016-05-25 DIAGNOSIS — E039 Hypothyroidism, unspecified: Secondary | ICD-10-CM | POA: Diagnosis not present

## 2016-05-27 DIAGNOSIS — I6523 Occlusion and stenosis of bilateral carotid arteries: Secondary | ICD-10-CM | POA: Diagnosis not present

## 2016-05-29 DIAGNOSIS — M5416 Radiculopathy, lumbar region: Secondary | ICD-10-CM | POA: Diagnosis not present

## 2016-05-29 DIAGNOSIS — M7061 Trochanteric bursitis, right hip: Secondary | ICD-10-CM | POA: Diagnosis not present

## 2016-05-29 DIAGNOSIS — M7062 Trochanteric bursitis, left hip: Secondary | ICD-10-CM | POA: Diagnosis not present

## 2016-05-29 DIAGNOSIS — M5126 Other intervertebral disc displacement, lumbar region: Secondary | ICD-10-CM | POA: Diagnosis not present

## 2016-05-29 DIAGNOSIS — M5136 Other intervertebral disc degeneration, lumbar region: Secondary | ICD-10-CM | POA: Diagnosis not present

## 2016-06-02 DIAGNOSIS — G4733 Obstructive sleep apnea (adult) (pediatric): Secondary | ICD-10-CM | POA: Diagnosis not present

## 2016-06-10 DIAGNOSIS — J301 Allergic rhinitis due to pollen: Secondary | ICD-10-CM | POA: Diagnosis not present

## 2016-06-10 DIAGNOSIS — H6060 Unspecified chronic otitis externa, unspecified ear: Secondary | ICD-10-CM | POA: Diagnosis not present

## 2016-06-25 DIAGNOSIS — E119 Type 2 diabetes mellitus without complications: Secondary | ICD-10-CM | POA: Diagnosis not present

## 2016-06-25 DIAGNOSIS — M5441 Lumbago with sciatica, right side: Secondary | ICD-10-CM | POA: Diagnosis not present

## 2016-06-25 DIAGNOSIS — M5416 Radiculopathy, lumbar region: Secondary | ICD-10-CM | POA: Diagnosis not present

## 2016-06-25 DIAGNOSIS — E039 Hypothyroidism, unspecified: Secondary | ICD-10-CM | POA: Diagnosis not present

## 2016-06-25 DIAGNOSIS — M5126 Other intervertebral disc displacement, lumbar region: Secondary | ICD-10-CM | POA: Diagnosis not present

## 2016-06-25 DIAGNOSIS — I10 Essential (primary) hypertension: Secondary | ICD-10-CM | POA: Diagnosis not present

## 2016-07-15 ENCOUNTER — Emergency Department
Admission: EM | Admit: 2016-07-15 | Discharge: 2016-07-15 | Disposition: A | Discharge status: Home / Self Care | Payer: Medicare Other | Attending: Emergency Medicine | Admitting: Emergency Medicine

## 2016-07-15 ENCOUNTER — Encounter: Payer: Self-pay | Admitting: Emergency Medicine

## 2016-07-15 DIAGNOSIS — M549 Dorsalgia, unspecified: Secondary | ICD-10-CM | POA: Diagnosis not present

## 2016-07-15 DIAGNOSIS — M542 Cervicalgia: Secondary | ICD-10-CM | POA: Insufficient documentation

## 2016-07-15 DIAGNOSIS — G8929 Other chronic pain: Secondary | ICD-10-CM | POA: Diagnosis not present

## 2016-07-15 DIAGNOSIS — I10 Essential (primary) hypertension: Secondary | ICD-10-CM | POA: Diagnosis not present

## 2016-07-15 DIAGNOSIS — Z79899 Other long term (current) drug therapy: Secondary | ICD-10-CM | POA: Insufficient documentation

## 2016-07-15 DIAGNOSIS — E039 Hypothyroidism, unspecified: Secondary | ICD-10-CM | POA: Insufficient documentation

## 2016-07-15 MED ORDER — TRAMADOL HCL 50 MG PO TABS: 50.0000 mg | ORAL_TABLET | Freq: Two times a day (BID) | ORAL | 0 refills | Status: DC | PRN

## 2016-07-15 NOTE — ED Notes (Signed)
Pt discharged home after verbalizing understanding of discharge instructions; nad noted. 

## 2016-07-15 NOTE — ED Triage Notes (Signed)
C/O right neck pain x 1 week.  Denies injury.

## 2016-07-15 NOTE — Discharge Instructions (Signed)
Advised to follow-up with family doctor to consider consult for definitive evaluation and treatment

## 2016-07-15 NOTE — ED Provider Notes (Signed)
[facility] Emergency Department Provider Note   ____________________________________________   None    (approximate)  I have reviewed the triage vital signs and the nursing notes.   HISTORY  Chief Complaint Neck Pain    HPI Helen Mccormick is a 76 y.o. female patient complaining of increasing neck pain. Patient has a history of chronic neck pain for 3 years. Patient state onset of neck pain was status post a TENS unit being sent to the left in place for prolonged period of time. Patient also has anterior fusion of C3 and 4. Patient is not discussed with increased pain with her PCP. Patient rates  the pain as a 9/10. Patient described a pain as "achy". Patient denies any radicular component to this pain. No palliative measures for this complaint.   Past Medical History:  Diagnosis Date  . Heart murmur   . Hypertension   . Thyroid disease     There are no active problems to display for this patient.   Past Surgical History:  Procedure Laterality Date  . ABDOMINAL HYSTERECTOMY    . BACK SURGERY    . CERVICAL FUSION    . THYROID SURGERY      Prior to Admission medications   Medication Sig Start Date End Date Taking? Authorizing Provider  atenolol (TENORMIN) 50 MG tablet Take 50 mg by mouth daily.    Historical Provider, MD  atorvastatin (LIPITOR) 20 MG tablet Take 20 mg by mouth at bedtime.     Historical Provider, MD  benzonatate (TESSALON PERLES) 100 MG capsule Take 1 capsule (100 mg total) by mouth 3 (three) times daily as needed for cough. 07/15/15   Joanne Gavel, MD  escitalopram (LEXAPRO) 20 MG tablet Take 20 mg by mouth daily.    Historical Provider, MD  ferrous sulfate 325 (65 FE) MG tablet Take 325 mg by mouth daily with breakfast.    Historical Provider, MD  fluticasone (FLONASE) 50 MCG/ACT nasal spray Place 2 sprays into both nostrils daily.    Historical Provider, MD  levothyroxine (SYNTHROID, LEVOTHROID) 75 MCG tablet Take 75 mcg  by mouth daily before breakfast.    Historical Provider, MD  lubiprostone (AMITIZA) 8 MCG capsule Take 8 mcg by mouth 2 (two) times daily with a meal.    Historical Provider, MD  naproxen (NAPROSYN) 500 MG tablet Take 1 tablet (500 mg total) by mouth 2 (two) times daily with a meal. 10/20/15   Jami L Hagler, PA-C  omeprazole (PRILOSEC) 40 MG capsule Take 40 mg by mouth daily.    Historical Provider, MD  potassium chloride 20 MEQ TBCR Take 10 mEq by mouth once. 02/27/15   Daymon Larsen, MD  predniSONE (DELTASONE) 20 MG tablet Take 2 tablets (40 mg total) by mouth daily. 11/16/15   Nance Pear, MD  pseudoephedrine (SUDAFED) 30 MG tablet Take 1 tablet (30 mg total) by mouth every 8 (eight) hours as needed for congestion. 12/09/15 12/08/16  Sable Feil, PA-C  traMADol (ULTRAM) 50 MG tablet Take 1 tablet (50 mg total) by mouth every 6 (six) hours as needed. 06/14/15   Johnn Hai, PA-C  traMADol (ULTRAM) 50 MG tablet Take 1 tablet (50 mg total) by mouth every 12 (twelve) hours as needed for moderate pain. 12/09/15   Sable Feil, PA-C  traMADol (ULTRAM) 50 MG tablet Take 1 tablet (50 mg total) by mouth every 12 (twelve) hours as needed. 07/15/16   Sable Feil, PA-C  Allergies Amlodipine  Family History  Problem Relation Age of Onset  . Breast cancer Maternal Aunt 60    Social History Social History  Substance Use Topics  . Smoking status: Never Smoker  . Smokeless tobacco: Never Used  . Alcohol use No    Review of Systems Constitutional: No fever/chills Eyes: No visual changes. ENT: No sore throat. Cardiovascular: Denies chest pain. Respiratory: Denies shortness of breath. Gastrointestinal: No abdominal pain.  No nausea, no vomiting.  No diarrhea.  No constipation. Genitourinary: Negative for dysuria. Musculoskeletal: Chronic neck and back.  Skin: Negative for rash. Neurological: Negative for headaches, focal weakness or numbness. Endocrine:Hypertension  hypothyroidism. Allergic/Immunilogical: See medication list   ____________________________________________   PHYSICAL EXAM:  VITAL SIGNS: ED Triage Vitals  Enc Vitals Group     BP 07/15/16 1240 (!) 122/59     Pulse Rate 07/15/16 1240 64     Resp 07/15/16 1240 16     Temp 07/15/16 1240 98.3 F (36.8 C)     Temp Source 07/15/16 1240 Oral     SpO2 07/15/16 1240 96 %     Weight 07/15/16 1240 169 lb (76.7 kg)     Height 07/15/16 1240 5\' 4"  (1.626 m)     Head Circumference --      Peak Flow --      Pain Score 07/15/16 1239 9     Pain Loc --      Pain Edu? --      Excl. in [place]? --     Constitutional: Alert and oriented. Well appearing and in no acute distress. Eyes: Conjunctivae are normal. PERRL. EOMI. Head: Atraumatic. Nose: No congestion/rhinnorhea. Mouth/Throat: Mucous membranes are moist.  Oropharynx non-erythematous. Neck: No stridor.  No cervical spine tenderness to palpation. Hematological/Lymphatic/Immunilogical: No cervical lymphadenopathy. Cardiovascular: Normal rate, regular rhythm. Grossly normal heart sounds.  Good peripheral circulation. Respiratory: Normal respiratory effort.  No retractions. Lungs CTAB. Gastrointestinal: Soft and nontender. No distention. No abdominal bruits. No CVA tenderness. Musculoskeletal: No spinal deformity. Patient full nuchal range of motion of the neck. She complains of pain to the right lateral aspect of her neck. Patient has full nuchal range of motion of the upper extremities. Patient struck his resistances 3/5 on the right side . Neurologic:  Normal speech and language. No gross focal neurologic deficits are appreciated. No gait instability. Skin:  Skin is warm, dry and intact. No rash noted. Psychiatric: Mood and affect are normal. Speech and behavior are normal.  ____________________________________________   LABS (all labs ordered are listed, but only abnormal results are displayed)  Labs Reviewed - No data to  display ____________________________________________  EKG   ____________________________________________  RADIOLOGY   ____________________________________________   PROCEDURES  Procedure(s) performed: None  Procedures  Critical Care performed: No  ____________________________________________   INITIAL IMPRESSION / ASSESSMENT AND PLAN / ED COURSE  Pertinent labs & imaging results that were available during my care of the patient were reviewed by me and considered in my medical decision making (see chart for details).  Neck pain. No provocative incident.      ____________________________________________   FINAL CLINICAL IMPRESSION(S) / ED DIAGNOSES  Final diagnoses:  Neck pain  Patient given discharge care instructions. Patient advised to follow-up with family doctor for evaluation for consult for definitive evaluation and treatment. She given prescription for tramadol to take twice a day.  NEW MEDICATIONS STARTED DURING THIS VISIT:  New Prescriptions   TRAMADOL (ULTRAM) 50 MG TABLET    Take 1 tablet (50  mg total) by mouth every 12 (twelve) hours as needed.     Note:  This document was prepared using Dragon voice recognition software and may include unintentional dictation errors.    Sable Feil, PA-C 07/15/16 [place], MD 07/15/16 [phone]

## 2016-07-15 NOTE — ED Notes (Signed)
Pt presents with neck pain on right side x 3 years intermittently, constant and worse over last week. Pt states she is having trouble sleeping because of it, and she has used aspercreme with no relief. Pt alert & oriented with NAD noted.

## 2016-08-25 DIAGNOSIS — I10 Essential (primary) hypertension: Secondary | ICD-10-CM | POA: Diagnosis not present

## 2016-08-25 DIAGNOSIS — R10817 Generalized abdominal tenderness: Secondary | ICD-10-CM | POA: Diagnosis not present

## 2016-08-25 DIAGNOSIS — R5381 Other malaise: Secondary | ICD-10-CM | POA: Diagnosis not present

## 2016-08-25 DIAGNOSIS — K219 Gastro-esophageal reflux disease without esophagitis: Secondary | ICD-10-CM | POA: Diagnosis not present

## 2016-08-27 ENCOUNTER — Other Ambulatory Visit: Payer: Self-pay | Admitting: Internal Medicine

## 2016-08-27 DIAGNOSIS — Z1231 Encounter for screening mammogram for malignant neoplasm of breast: Secondary | ICD-10-CM

## 2016-08-31 DIAGNOSIS — M5126 Other intervertebral disc displacement, lumbar region: Secondary | ICD-10-CM | POA: Diagnosis not present

## 2016-08-31 DIAGNOSIS — M7061 Trochanteric bursitis, right hip: Secondary | ICD-10-CM | POA: Diagnosis not present

## 2016-08-31 DIAGNOSIS — M5416 Radiculopathy, lumbar region: Secondary | ICD-10-CM | POA: Diagnosis not present

## 2016-08-31 DIAGNOSIS — R10817 Generalized abdominal tenderness: Secondary | ICD-10-CM | POA: Diagnosis not present

## 2016-08-31 DIAGNOSIS — M5136 Other intervertebral disc degeneration, lumbar region: Secondary | ICD-10-CM | POA: Diagnosis not present

## 2016-08-31 DIAGNOSIS — M62830 Muscle spasm of back: Secondary | ICD-10-CM | POA: Diagnosis not present

## 2016-09-07 DIAGNOSIS — G4733 Obstructive sleep apnea (adult) (pediatric): Secondary | ICD-10-CM | POA: Diagnosis not present

## 2016-09-09 DIAGNOSIS — L818 Other specified disorders of pigmentation: Secondary | ICD-10-CM | POA: Diagnosis not present

## 2016-09-09 DIAGNOSIS — L299 Pruritus, unspecified: Secondary | ICD-10-CM | POA: Diagnosis not present

## 2016-09-10 DIAGNOSIS — K219 Gastro-esophageal reflux disease without esophagitis: Secondary | ICD-10-CM | POA: Diagnosis not present

## 2016-09-10 DIAGNOSIS — E032 Hypothyroidism due to medicaments and other exogenous substances: Secondary | ICD-10-CM | POA: Diagnosis not present

## 2016-09-10 DIAGNOSIS — I10 Essential (primary) hypertension: Secondary | ICD-10-CM | POA: Diagnosis not present

## 2016-09-10 DIAGNOSIS — E781 Pure hyperglyceridemia: Secondary | ICD-10-CM | POA: Diagnosis not present

## 2016-10-06 ENCOUNTER — Ambulatory Visit
Admission: RE | Admit: 2016-10-06 | Discharge: 2016-10-06 | Disposition: A | Discharge status: Home / Self Care | Payer: Medicare Other | Source: Ambulatory Visit | Attending: Internal Medicine | Admitting: Internal Medicine

## 2016-10-06 DIAGNOSIS — Z1231 Encounter for screening mammogram for malignant neoplasm of breast: Secondary | ICD-10-CM | POA: Insufficient documentation

## 2016-10-09 DIAGNOSIS — H2513 Age-related nuclear cataract, bilateral: Secondary | ICD-10-CM | POA: Diagnosis not present

## 2016-10-26 DIAGNOSIS — R7889 Finding of other specified substances, not normally found in blood: Secondary | ICD-10-CM | POA: Diagnosis not present

## 2016-10-26 DIAGNOSIS — E781 Pure hyperglyceridemia: Secondary | ICD-10-CM | POA: Diagnosis not present

## 2016-10-26 DIAGNOSIS — J449 Chronic obstructive pulmonary disease, unspecified: Secondary | ICD-10-CM | POA: Diagnosis not present

## 2016-10-26 DIAGNOSIS — I10 Essential (primary) hypertension: Secondary | ICD-10-CM | POA: Diagnosis not present

## 2016-10-26 DIAGNOSIS — E559 Vitamin D deficiency, unspecified: Secondary | ICD-10-CM | POA: Diagnosis not present

## 2016-10-26 DIAGNOSIS — I158 Other secondary hypertension: Secondary | ICD-10-CM | POA: Diagnosis not present

## 2016-10-26 DIAGNOSIS — K219 Gastro-esophageal reflux disease without esophagitis: Secondary | ICD-10-CM | POA: Diagnosis not present

## 2016-10-26 DIAGNOSIS — E784 Other hyperlipidemia: Secondary | ICD-10-CM | POA: Diagnosis not present

## 2016-11-10 ENCOUNTER — Other Ambulatory Visit: Payer: Self-pay | Admitting: Internal Medicine

## 2016-11-10 DIAGNOSIS — R102 Pelvic and perineal pain: Secondary | ICD-10-CM

## 2016-11-10 DIAGNOSIS — R1084 Generalized abdominal pain: Secondary | ICD-10-CM

## 2016-11-13 ENCOUNTER — Ambulatory Visit
Admission: RE | Admit: 2016-11-13 | Discharge: 2016-11-13 | Disposition: A | Discharge status: Home / Self Care | Payer: Medicare Other | Source: Ambulatory Visit | Attending: Internal Medicine | Admitting: Internal Medicine

## 2016-11-13 DIAGNOSIS — Z90722 Acquired absence of ovaries, bilateral: Secondary | ICD-10-CM | POA: Insufficient documentation

## 2016-11-13 DIAGNOSIS — R102 Pelvic and perineal pain: Secondary | ICD-10-CM | POA: Insufficient documentation

## 2016-11-13 DIAGNOSIS — R1084 Generalized abdominal pain: Secondary | ICD-10-CM | POA: Diagnosis present

## 2016-11-13 DIAGNOSIS — Z90710 Acquired absence of both cervix and uterus: Secondary | ICD-10-CM | POA: Diagnosis not present

## 2017-03-02 ENCOUNTER — Emergency Department
Admission: EM | Admit: 2017-03-02 | Discharge: 2017-03-02 | Disposition: A | Discharge status: Home / Self Care | Payer: Medicare Other | Attending: Emergency Medicine | Admitting: Emergency Medicine

## 2017-03-02 ENCOUNTER — Encounter: Payer: Self-pay | Admitting: Emergency Medicine

## 2017-03-02 DIAGNOSIS — Z79899 Other long term (current) drug therapy: Secondary | ICD-10-CM | POA: Insufficient documentation

## 2017-03-02 DIAGNOSIS — R0981 Nasal congestion: Secondary | ICD-10-CM | POA: Diagnosis present

## 2017-03-02 DIAGNOSIS — Z791 Long term (current) use of non-steroidal anti-inflammatories (NSAID): Secondary | ICD-10-CM | POA: Insufficient documentation

## 2017-03-02 DIAGNOSIS — I10 Essential (primary) hypertension: Secondary | ICD-10-CM | POA: Diagnosis not present

## 2017-03-02 DIAGNOSIS — J069 Acute upper respiratory infection, unspecified: Secondary | ICD-10-CM | POA: Diagnosis not present

## 2017-03-02 MED ORDER — AZITHROMYCIN 250 MG PO TABS: ORAL_TABLET | ORAL | 0 refills | Status: DC

## 2017-03-02 MED ORDER — BENZONATATE 200 MG PO CAPS: 200.0000 mg | ORAL_CAPSULE | Freq: Three times a day (TID) | ORAL | 0 refills | Status: DC | PRN

## 2017-03-02 NOTE — ED Triage Notes (Signed)
Patient presents to the ED with cough and congestion x 1 week.  Patient states she has been coughing so frequently she is now having chest pain with coughing.  Patient is in no obvious distress at this time.  Patient states she had a fever on Sunday but none since.  Patient denies vomiting and diarrhea.

## 2017-03-02 NOTE — Discharge Instructions (Signed)
Follow up with your regular doctor if  you are not better in 3-5 days, return to the ER if you are worsening, take the medication as prescribed

## 2017-03-02 NOTE — ED Notes (Signed)
See triage note. Presents with some discomfort to mid chest /back for about 1 week  Intermittent subjective fever   Afebrile on arrival to ed   States pain is increased with cough

## 2017-03-02 NOTE — ED Provider Notes (Signed)
[facility] Emergency Department Provider Note  ____________________________________________   First MD Initiated Contact with Patient 03/02/17 1007     (approximate)  I have reviewed the triage vital signs and the nursing notes.   HISTORY  Chief Complaint Nasal Congestion and Cough    HPI Helen Mccormick is a 76 y.o. female complains of cough and congestion, states her chest hurts with the cough, she has felt warm but does not know if she had a fever, states she is getting brown to yellow mucus out, denies nausea, vomiting, diarrhea, states she does not have cardiac type chest pain or shortness of breath   Past Medical History:  Diagnosis Date  . Heart murmur   . Hypertension   . Thyroid disease     There are no active problems to display for this patient.   Past Surgical History:  Procedure Laterality Date  . ABDOMINAL HYSTERECTOMY    . BACK SURGERY    . CERVICAL FUSION    . THYROID SURGERY      Prior to Admission medications   Medication Sig Start Date End Date Taking? Authorizing Provider  atenolol (TENORMIN) 50 MG tablet Take 50 mg by mouth daily.    [provider]  atorvastatin (LIPITOR) 20 MG tablet Take 20 mg by mouth at bedtime.     [provider]  azithromycin (ZITHROMAX Z-PAK) 250 MG tablet 2 pills today then 1 pill a day for 4 days 03/02/17   Caryn Section Linden Dolin, PA-C  benzonatate (TESSALON) 200 MG capsule Take 1 capsule (200 mg total) by mouth 3 (three) times daily as needed for cough. 03/02/17   Fisher, Linden Dolin, PA-C  escitalopram (LEXAPRO) 20 MG tablet Take 20 mg by mouth daily.    [provider]  ferrous sulfate 325 (65 FE) MG tablet Take 325 mg by mouth daily with breakfast.    [provider]  fluticasone (FLONASE) 50 MCG/ACT nasal spray Place 2 sprays into both nostrils daily.    [provider]  levothyroxine (SYNTHROID, LEVOTHROID) 75 MCG tablet Take 75 mcg by mouth daily before  breakfast.    [provider]  lubiprostone (AMITIZA) 8 MCG capsule Take 8 mcg by mouth 2 (two) times daily with a meal.    [provider]  naproxen (NAPROSYN) 500 MG tablet Take 1 tablet (500 mg total) by mouth 2 (two) times daily with a meal. 10/20/15   Hagler, Jami L, PA-C  omeprazole (PRILOSEC) 40 MG capsule Take 40 mg by mouth daily.    [provider]  potassium chloride 20 MEQ TBCR Take 10 mEq by mouth once. 02/27/15   Daymon Larsen, MD    Allergies Amlodipine  Family History  Problem Relation Age of Onset  . Breast cancer Maternal Aunt 60    Social History Social History   Tobacco Use  . Smoking status: Never Smoker  . Smokeless tobacco: Never Used  Substance Use Topics  . Alcohol use: No  . Drug use: No    Review of Systems  Constitutional: Questionable fever/chills Eyes: No visual changes. ENT: No sore throat. Positive sinus drainage Respiratory: Positive cough Genitourinary: Negative for dysuria. Musculoskeletal: Negative for back pain. Skin: Negative for rash.    ____________________________________________   PHYSICAL EXAM:  VITAL SIGNS: ED Triage Vitals  Enc Vitals Group     BP 03/02/17 0947 137/62     Pulse Rate 03/02/17 0947 60     Resp 03/02/17 0947 16  Temp 03/02/17 0947 98.5 F (36.9 C)     Temp Source 03/02/17 0947 Oral     SpO2 03/02/17 0947 97 %     Weight 03/02/17 0948 174 lb (78.9 kg)     Height 03/02/17 0948 5\' 4"  (1.626 m)     Head Circumference --      Peak Flow --      Pain Score 03/02/17 0947 9     Pain Loc --      Pain Edu? --      Excl. in [place]? --     Constitutional: Alert and oriented. Well appearing and in no acute distress. Eyes: Conjunctivae are normal.  Head: Atraumatic. Nose: Positive for yellow congestion/rhinnorhea. Mouth/Throat: Mucous membranes are moist.  Throat is normal Cardiovascular: Normal rate, regular rhythm. Heart sounds are normal, EKG was normal Respiratory: Normal  respiratory effort.  No retractions, lungs are clear to auscultation, cough is dry and hacking GU: deferred Musculoskeletal: FROM all extremities, warm and well perfused Neurologic:  Normal speech and language.  Skin:  Skin is warm, dry and intact. No rash noted. Psychiatric: Mood and affect are normal. Speech and behavior are normal.  ____________________________________________   LABS (all labs ordered are listed, but only abnormal results are displayed)  Labs Reviewed - No data to display ____________________________________________   ____________________________________________  RADIOLOGY    ____________________________________________   PROCEDURES  Procedure(s) performed: EKG was normal      ____________________________________________   INITIAL IMPRESSION / ASSESSMENT AND PLAN / ED COURSE  Pertinent labs & imaging results that were available during my care of the patient were reviewed by me and considered in my medical decision making (see chart for details).  Patient is a 76 year old female with an acute upper story infection, she was given a prescription for a Z-Pak and Tessalon Perles, during her visit here in the emergency department she denied any type of cardiac chest pain or shortness of breath, she did not have any swelling on her extremities either, she is to follow-up with her regular doctor if she is not better in 3-5 days, she is to return to the emergency department if she is worsening      ____________________________________________   FINAL CLINICAL IMPRESSION(S) / ED DIAGNOSES  Final diagnoses:  Acute upper respiratory infection      NEW MEDICATIONS STARTED DURING THIS VISIT:  This SmartLink is deprecated. Use AVSMEDLIST instead to display the medication list for a patient.   Note:  This document was prepared using Dragon voice recognition software and may include unintentional dictation errors.    Versie Starks, PA-C 03/02/17  1555    Earleen Newport, MD 03/03/17 [phone]

## 2017-03-12 ENCOUNTER — Ambulatory Visit (INDEPENDENT_AMBULATORY_CARE_PROVIDER_SITE_OTHER): Payer: Medicare Other | Admitting: Nurse Practitioner

## 2017-03-12 ENCOUNTER — Encounter: Payer: Self-pay | Admitting: Nurse Practitioner

## 2017-03-12 ENCOUNTER — Other Ambulatory Visit: Payer: Self-pay

## 2017-03-12 VITALS — BP 145/63 | HR 62 | Temp 97.8°F | Ht 64.0 in | Wt 176.6 lb

## 2017-03-12 DIAGNOSIS — R7303 Prediabetes: Secondary | ICD-10-CM

## 2017-03-12 DIAGNOSIS — E890 Postprocedural hypothyroidism: Secondary | ICD-10-CM | POA: Diagnosis not present

## 2017-03-12 DIAGNOSIS — I10 Essential (primary) hypertension: Secondary | ICD-10-CM

## 2017-03-12 DIAGNOSIS — Z7689 Persons encountering health services in other specified circumstances: Secondary | ICD-10-CM

## 2017-03-12 MED ORDER — GLUCOSE BLOOD VI STRP: ORAL_STRIP | 12 refills | Status: DC

## 2017-03-12 NOTE — Patient Instructions (Addendum)
Helen Mccormick, Thank you for coming in to clinic today.   1. You can use a nasal saline spray for extra moisture in your nose to prevent your nose bleeds. - Do NOT use afrin.  2. No changes to your medicines.  3. Call us about your CPAP machine company for your mask. [phone]  Also let us know if you find out about your other Blood pressure pill.  Please schedule a follow-up appointment with Cassell Smiles, AGNP. Return in about 3 months (around 06/10/2017) for prediabetes w/ A1c and Blood pressure.  If you have any other questions or concerns, please feel free to call the clinic or send a message through [place]. You may also schedule an earlier appointment if necessary.  You will receive a survey after today's visit either digitally by e-mail or paper by [employer]. Your experiences and feedback matter to [name].  Please respond so we know how we are doing as we provide care for you.  Cassell Smiles, DNP, AGNP-BC Adult Gerontology Nurse Practitioner [place]

## 2017-03-12 NOTE — Progress Notes (Signed)
Subjective:    Patient ID: Helen Mccormick, female    DOB: 1940-04-01, 76 y.o.   MRN: [id number]  Helen Mccormick is a 76 y.o. female presenting on 03/12/2017 for Establish Care (f/u Dr. Rosario Jacks ); Hypertension; and Hypothyroidism   HPI Establish Care New Provider Pt last seen by PCP about 2 months ago. Obtain records from [facility].  [place] [address] [phone].  Prior to that single visit about 3 months ago, pt was cared for by Dr. Jonn Shingles - [place].  [place].  She was Field seismologist for career.  Hypertension - She is checking BP at home or outside of clinic.    - Current medications: amlodipine 10 mg once daily, bisoprolol-hydrochlorothiazide 2.5-6.25 mg once daily and is tolerating well without side effects. Pt states she used to take a third BP pill, but does not remember what it was. - She is not currently symptomatic. - Pt denies headache, lightheadedness, dizziness, changes in vision, chest tightness/pressure, palpitations, leg swelling, sudden loss of speech or loss of consciousness. - She  reports an exercise routine that includes walking, 2 days per week and more frequently during the summer. - Her diet is moderate in salt, moderate in fat, and moderate in carbohydrates.   Post-surgical hypothyroidism - Pt states she is taking her levothyroxine 82mcg in the am at least 1 hour before eating or drinking and taking other medicines.   - She is not currently symptomatic. - She denies, fatigue, excess energy, weight changes, heart racing, heart palpitations, heat and cold intolerance, changes in hair/skin/nails, and lower leg swelling.  - She does not have any compressive symptoms to include difficulty swallowing, globus sensation, or difficulty breathing when lying flat.  Past Medical History:  Diagnosis Date  . Allergy   . Anxiety   . Asthma   . Colon polyp   . Depression   . Diabetes mellitus without  complication ([place])   . GERD (gastroesophageal reflux disease)   . Heart murmur   . Hyperlipidemia   . Hypertension   . Seizures ([place])   . Sleep apnea   . Thyroid disease    Past Surgical History:  Procedure Laterality Date  . ABDOMINAL HYSTERECTOMY    . BACK SURGERY    . BACK SURGERY    . CERVICAL FUSION    . NECK SURGERY    . THYROID SURGERY    . THYROID SURGERY     Family History  Problem Relation Age of Onset  . Breast cancer Maternal Aunt 60  . Diabetes Mother   . Leukemia Mother   . Diabetes Sister   . Mental illness Sister   . Colon polyps Sister   . Stroke Father   . Cancer Brother        rectal  . Healthy Daughter   . Hypertension Son   . Diabetes Sister   . Thyroid disease Sister   . Healthy Sister   . Healthy Sister   . Stroke Brother   . Healthy Son   . Healthy Son   . Healthy Daughter     Social History   Tobacco Use  . Smoking status: Never Smoker  . Smokeless tobacco: Never Used  Substance Use Topics  . Alcohol use: No  . Drug use: No    Review of Systems  Constitutional: Negative.   HENT: Negative.   Eyes: Negative.   Respiratory: Negative.   Cardiovascular: Negative.   Gastrointestinal:  Negative.   Endocrine: Negative.   Genitourinary: Negative.   Musculoskeletal: Negative.   Skin: Negative.   Allergic/Immunologic: Negative.   Neurological: Negative.   Hematological: Negative.   Psychiatric/Behavioral: Negative.    Per HPI unless specifically indicated above     Objective:    BP (!) 145/63 (BP Location: Right Arm, Patient Position: Sitting, Cuff Size: Normal)   Pulse 62   Temp 97.8 F (36.6 C) (Oral)   Ht 5\' 4"  (1.626 m)   Wt 176 lb 9.6 oz (80.1 kg)   BMI 30.31 kg/m    Wt Readings from Last 3 Encounters:  03/12/17 176 lb 9.6 oz (80.1 kg)  03/02/17 174 lb (78.9 kg)  07/15/16 169 lb (76.7 kg)    Physical Exam  General - overweight, well-appearing, NAD HEENT - Normocephalic, atraumatic Neck - supple, non-tender, no  LAD, no palpable thyroid tissue, no carotid bruit Heart - RRR, no murmurs heard Lungs - Clear throughout all lobes, no wheezing, crackles, or rhonchi. Normal work of breathing. Extremeties - non-tender, no edema, cap refill < 2 seconds, peripheral pulses intact +2 bilaterally Skin - warm, dry Neuro - awake, alert, oriented x3, normal gait Psych - Normal mood and affect, normal behavior   Results for orders placed or performed during the hospital encounter of 65/78/46  Basic metabolic panel  Result Value Ref Range   Sodium 141 135 - 145 mmol/L   Potassium 3.5 3.5 - 5.1 mmol/L   Chloride 107 101 - 111 mmol/L   CO2 27 22 - 32 mmol/L   Glucose, Bld 118 (H) 65 - 99 mg/dL   BUN 26 (H) 6 - 20 mg/dL   Creatinine, Ser 1.02 (H) 0.44 - 1.00 mg/dL   Calcium 8.5 (L) 8.9 - 10.3 mg/dL   GFR calc non Af Amer 53 (L) >60 mL/min   GFR calc Af Amer >60 >60 mL/min   Anion gap 7 5 - 15  CBC  Result Value Ref Range   WBC 7.5 3.6 - 11.0 K/uL   RBC 4.66 3.80 - 5.20 MIL/uL   Hemoglobin 11.9 (L) 12.0 - 16.0 g/dL   HCT 36.0 35.0 - 47.0 %   MCV 77.3 (L) 80.0 - 100.0 fL   MCH 25.4 (L) 26.0 - 34.0 pg   MCHC 32.9 32.0 - 36.0 g/dL   RDW 16.7 (H) 11.5 - 14.5 %   Platelets 188 150 - 440 K/uL  Urinalysis complete, with microscopic  Result Value Ref Range   Color, Urine YELLOW (A) YELLOW   APPearance CLEAR (A) CLEAR   Glucose, UA NEGATIVE NEGATIVE mg/dL   Bilirubin Urine NEGATIVE NEGATIVE   Ketones, ur NEGATIVE NEGATIVE mg/dL   Specific Gravity, Urine 1.023 1.005 - 1.030   Hgb urine dipstick NEGATIVE NEGATIVE   pH 5.0 5.0 - 8.0   Protein, ur NEGATIVE NEGATIVE mg/dL   Nitrite NEGATIVE NEGATIVE   Leukocytes, UA NEGATIVE NEGATIVE   RBC / HPF 0-5 0 - 5 RBC/hpf   WBC, UA 0-5 0 - 5 WBC/hpf   Bacteria, UA NONE SEEN NONE SEEN   Squamous Epithelial / LPF 0-5 (A) NONE SEEN  Sedimentation rate  Result Value Ref Range   Sed Rate 39 (H) 0 - 30 mm/hr      Assessment & Plan:   Problem List Items Addressed This  Visit      Cardiovascular and Mediastinum   Hypertension    Slightly uncontrolled hypertension above BP goal of < 140/90 for patient age >62.  Pt is  not currently working on lifestyle modifications.  Taking medications tolerating well without side effects. No currently identified complications.  Plan: 1. Continue taking amlodipine 10 mg and bisoprolol-hydrochlorothiazide 2.5-6.25 mg once daily. 2. Obtain labs at next visit or if unable to get records.  Pt declines today as she believes she had labs at her last visit. 3. Encouraged heart healthy diet and increasing exercise to 30 minutes most days of the week. 4. Check BP 1-2 x per week at home, keep log, and bring to clinic at next appointment. 5. Follow up 3 months.        Relevant Medications   aspirin 81 MG chewable tablet   bisoprolol-hydrochlorothiazide (ZIAC) 2.5-6.25 MG tablet   amLODipine (NORVASC) 10 MG tablet     Endocrine   Hypothyroidism, postsurgical    Pt w/ daily levothyroxine s/p thyroidectomy several years ago. Pt reports she has had this stable dose of levothyroxine for several years.  Plan: 1. Continue levothyroxine once daily. 2. Recheck TSH at least every 6 months. 3. Followup 3 months.        Other   Prediabetes - Primary    Pt reports history of prediabetes.  No recent A1c value available today.  Pt does check home glucose, but has run out of strips.  Requests refill.  Refill provided.  Followup A1c 3 months.      Relevant Medications   glucose blood test strip    Other Visit Diagnoses    Encounter to establish care     Previous PCP was at [facility].  Records will be requested.  Past medical, family, and surgical history reviewed w/ pt.      Meds ordered this encounter  Medications  . glucose blood test strip    Sig: Use 1 strip to check your blood sugar two times daily.    Dispense:  100 each    Refill:  12    Order Specific Question:   Supervising Provider    Answer:    Olin Hauser [[id number]]      Follow up plan: Return in about 3 months (around 06/10/2017) for prediabetes w/ A1c and Blood pressure.  Cassell Smiles, DNP, AGPCNP-BC Adult Gerontology Primary Care Nurse Practitioner [facility] 03/24/2017, 1:33 PM

## 2017-03-24 DIAGNOSIS — I10 Essential (primary) hypertension: Secondary | ICD-10-CM | POA: Insufficient documentation

## 2017-03-24 DIAGNOSIS — E1169 Type 2 diabetes mellitus with other specified complication: Secondary | ICD-10-CM | POA: Insufficient documentation

## 2017-03-24 DIAGNOSIS — E119 Type 2 diabetes mellitus without complications: Secondary | ICD-10-CM | POA: Insufficient documentation

## 2017-03-24 DIAGNOSIS — E785 Hyperlipidemia, unspecified: Secondary | ICD-10-CM

## 2017-03-24 DIAGNOSIS — E1159 Type 2 diabetes mellitus with other circulatory complications: Secondary | ICD-10-CM | POA: Insufficient documentation

## 2017-03-24 DIAGNOSIS — E890 Postprocedural hypothyroidism: Secondary | ICD-10-CM | POA: Insufficient documentation

## 2017-03-24 NOTE — Assessment & Plan Note (Signed)
Pt w/ daily levothyroxine s/p thyroidectomy several years ago. Pt reports she has had this stable dose of levothyroxine for several years.  Plan: 1. Continue levothyroxine once daily. 2. Recheck TSH at least every 6 months. 3. Followup 3 months.

## 2017-03-24 NOTE — Assessment & Plan Note (Signed)
Slightly uncontrolled hypertension above BP goal of < 140/90 for patient age >76.  Pt is not currently working on lifestyle modifications.  Taking medications tolerating well without side effects. No currently identified complications.  Plan: 1. Continue taking amlodipine 10 mg and bisoprolol-hydrochlorothiazide 2.5-6.25 mg once daily. 2. Obtain labs at next visit or if unable to get records.  Pt declines today as she believes she had labs at her last visit. 3. Encouraged heart healthy diet and increasing exercise to 30 minutes most days of the week. 4. Check BP 1-2 x per week at home, keep log, and bring to clinic at next appointment. 5. Follow up 3 months.

## 2017-03-24 NOTE — Assessment & Plan Note (Signed)
Pt reports history of prediabetes.  No recent A1c value available today.  Pt does check home glucose, but has run out of strips.  Requests refill.  Refill provided.  Followup A1c 3 months.

## 2017-03-25 ENCOUNTER — Telehealth: Payer: Self-pay | Admitting: Nurse Practitioner

## 2017-03-25 NOTE — Telephone Encounter (Signed)
Called pt to sched for AWV with Nurse Health Advisor.  C/b #  [phone] on Skype [email or web address] if you have questions ° °

## 2017-03-30 DIAGNOSIS — S52509A Unspecified fracture of the lower end of unspecified radius, initial encounter for closed fracture: Secondary | ICD-10-CM

## 2017-03-30 DIAGNOSIS — H3530 Unspecified macular degeneration: Secondary | ICD-10-CM

## 2017-03-30 HISTORY — DX: Unspecified macular degeneration: H35.30

## 2017-03-30 HISTORY — DX: Unspecified fracture of the lower end of unspecified radius, initial encounter for closed fracture: S52.509A

## 2017-04-27 ENCOUNTER — Ambulatory Visit (INDEPENDENT_AMBULATORY_CARE_PROVIDER_SITE_OTHER): Payer: Medicare Other

## 2017-04-27 VITALS — BP 138/72 | HR 78 | Temp 97.8°F | Resp 16 | Ht 64.0 in | Wt 174.0 lb

## 2017-04-27 DIAGNOSIS — Z0000 Encounter for general adult medical examination without abnormal findings: Secondary | ICD-10-CM | POA: Diagnosis not present

## 2017-04-27 DIAGNOSIS — Z23 Encounter for immunization: Secondary | ICD-10-CM

## 2017-04-27 DIAGNOSIS — Z1211 Encounter for screening for malignant neoplasm of colon: Secondary | ICD-10-CM

## 2017-04-27 NOTE — Progress Notes (Signed)
Subjective:   Helen Mccormick is a 77 y.o. female who presents for an Initial Medicare Annual Wellness Visit.  Review of Systems     Cardiac Risk Factors include: advanced age (>71men, >16 women);dyslipidemia;hypertension     Objective:    Today's Vitals   04/27/17 1049  BP: 138/72  Pulse: 78  Resp: 16  Temp: 97.8 F (36.6 C)  TempSrc: Oral  Weight: 174 lb (78.9 kg)  Height: 5\' 4"  (1.626 m)  PainSc: 0-No pain   Body mass index is 29.87 kg/m.  Advanced Directives 04/27/2017 03/02/2017 07/15/2016 11/16/2015 07/15/2015 06/14/2015 02/27/2015  Does Patient Have a Medical Advance Directive? No No No No No No No  Does patient want to make changes to medical advance directive? Yes (MAU/Ambulatory/Procedural Areas - Information given) - - - - - -  Would patient like information on creating a medical advance directive? - No - Patient declined No - Patient declined Yes - Educational materials given No - patient declined information - Yes - Educational materials given    Current Medications (verified) Outpatient Encounter Medications as of 04/27/2017  Medication Sig  . albuterol (PROVENTIL HFA;VENTOLIN HFA) 108 (90 Base) MCG/ACT inhaler Inhale into the lungs every 6 (six) hours as needed for wheezing or shortness of breath.  Marland Kitchen amLODipine (NORVASC) 10 MG tablet Take 10 mg by mouth daily.  Marland Kitchen aspirin 81 MG chewable tablet Chew by mouth daily.  Marland Kitchen atorvastatin (LIPITOR) 20 MG tablet Take 40 mg by mouth at bedtime.   . benzonatate (TESSALON) 200 MG capsule Take 200 mg by mouth 3 (three) times daily as needed for cough.  . bisoprolol-hydrochlorothiazide (ZIAC) 2.5-6.25 MG tablet Take 1 tablet by mouth daily.  . Cholecalciferol (VITAMIN D3) 3000 units TABS Take 25 mcg by mouth.  . Chromium 1000 MCG TABS Take 1,000 mcg by mouth.  . ergocalciferol (VITAMIN D2) 50000 units capsule Take 50,000 Units by mouth once a week.  . escitalopram (LEXAPRO) 10 MG tablet Take 10 mg by mouth daily.   . ferrous  sulfate 325 (65 FE) MG tablet Take 325 mg by mouth daily with breakfast.  . fluticasone (FLONASE) 50 MCG/ACT nasal spray Place 2 sprays into both nostrils daily.  Marland Kitchen glucose blood test strip Use 1 strip to check your blood sugar two times daily.  Marland Kitchen levothyroxine (SYNTHROID, LEVOTHROID) 75 MCG tablet Take 75 mcg by mouth daily before breakfast.  . lubiprostone (AMITIZA) 8 MCG capsule Take 8 mcg by mouth 2 (two) times daily with a meal.  . Omega-3 1000 MG CAPS Take by mouth.  Marland Kitchen omeprazole (PRILOSEC) 40 MG capsule Take 40 mg by mouth daily.  Marland Kitchen tiZANidine (ZANAFLEX) 2 MG tablet Take by mouth.  . naproxen (NAPROSYN) 500 MG tablet Take 1 tablet (500 mg total) by mouth 2 (two) times daily with a meal. (Patient not taking: Reported on 04/27/2017)   No facility-administered encounter medications on file as of 04/27/2017.     Allergies (verified) Etodolac and Tramadol   History: Past Medical History:  Diagnosis Date  . Allergy   . Anxiety   . Asthma   . Colon polyp   . Depression   . Diabetes mellitus without complication ([place])   . GERD (gastroesophageal reflux disease)   . Heart murmur   . Hyperlipidemia   . Hypertension   . Seizures ([place])   . Sleep apnea   . Thyroid disease    Past Surgical History:  Procedure Laterality Date  . ABDOMINAL HYSTERECTOMY    .  BACK SURGERY    . BACK SURGERY    . CERVICAL FUSION    . NECK SURGERY    . THYROID SURGERY    . THYROID SURGERY     Family History  Problem Relation Age of Onset  . Breast cancer Maternal Aunt 60  . Diabetes Mother   . Leukemia Mother   . Diabetes Sister   . Mental illness Sister   . Colon polyps Sister   . Stroke Father   . Cancer Brother        rectal  . Healthy Daughter   . Hypertension Son   . Diabetes Sister   . Thyroid disease Sister   . Healthy Sister   . Healthy Sister   . Stroke Brother   . Healthy Son   . Healthy Son   . Healthy Daughter    Social History   Socioeconomic History  . Marital status:  Single    Spouse name: None  . Number of children: None  . Years of education: None  . Highest education level: None  Social Needs  . Financial resource strain: Not hard at all  . Food insecurity - worry: Never true  . Food insecurity - inability: Never true  . Transportation needs - medical: No  . Transportation needs - non-medical: No  Occupational History  . None  Tobacco Use  . Smoking status: Never Smoker  . Smokeless tobacco: Never Used  Substance and Sexual Activity  . Alcohol use: No  . Drug use: No  . Sexual activity: Not Currently  Other Topics Concern  . None  Social History Narrative  . None    Tobacco Counseling Counseling given: Not Answered   Clinical Intake:  Pre-visit preparation completed: Yes  Pain : No/denies pain Pain Score: 0-No pain     Nutritional Status: BMI 25 -29 Overweight Nutritional Risks: None Diabetes: No  How often do you need to have someone help you when you read instructions, pamphlets, or other written materials from your doctor or pharmacy?: 1 - Never What is the last grade level you completed in school?: 10th grade  Interpreter Needed?: No  Information entered by :: Erdine Hulen,LPN    Activities of Daily Living In your present state of health, do you have any difficulty performing the following activities: 04/27/2017 03/12/2017  Hearing? N N  Vision? N Y  Difficulty concentrating or making decisions? N N  Walking or climbing stairs? N N  Dressing or bathing? N N  Doing errands, shopping? N N  Preparing Food and eating ? N -  Using the Toilet? N -  In the past six months, have you accidently leaked urine? Y -  Do you have problems with loss of bowel control? N -  Managing your Medications? N -  Managing your Finances? N -  Housekeeping or managing your Housekeeping? N -  Some recent data might be hidden     Immunizations and Health Maintenance Immunization History  Administered Date(s) Administered  .  Influenza, High Dose Seasonal PF 04/27/2017   Health Maintenance Due  Topic Date Due  . TETANUS/TDAP  09/03/1961  . COLONOSCOPY  09/03/1992  . PNA vac Low Risk Adult (1 of 2 - PCV13) 09/04/2007  . INFLUENZA VACCINE  10/28/2016   Waiting on vaccine report from previous PCP office.  Patient states she has had TDAP and Pneumonia Vaccine.    Patient Care Team: [name], NP as PCP - General (Nurse Practitioner) Sharlet Salina, MD  as Referring Physician (Physical Medicine and Rehabilitation)  Indicate any recent Medical Services you may have received from other than [facility] providers in the past year (date may be approximate).     Assessment:   This is a routine wellness examination for [place].  Hearing/Vision screen Vision Screening Comments: [facility]  Dietary issues and exercise activities discussed: Current Exercise Habits: The patient has a physically strenous job, but has no regular exercise apart from work., Exercise limited by: None identified  Goals    . DIET - INCREASE WATER INTAKE     Recommend drinking at least 6-8 glasses of water a day       Depression Screen PHQ 2/9 Scores 04/27/2017 03/12/2017  PHQ - 2 Score 0 4  PHQ- 9 Score - 9    Fall Risk Fall Risk  04/27/2017 03/12/2017  Falls in the past year? No No    Is the patient's home free of loose throw rugs in walkways, pet beds, electrical cords, etc?   yes      Grab bars in the bathroom? no      Handrails on the stairs?   yes      Adequate lighting?   yes  Timed Get Up and Go Performed  Completed in 8 seconds with no use of assistive devices. Steady gait. No intervention needed at this time.   Cognitive Function:     6CIT Screen 04/27/2017  What Year? 0 points  What month? 0 points  What time? 0 points  Count back from 20 0 points  Months in reverse 0 points  Repeat phrase 4 points  Total Score 4    Screening Tests Health Maintenance  Topic Date Due  .  TETANUS/TDAP  09/03/1961  . COLONOSCOPY  09/03/1992  . PNA vac Low Risk Adult (1 of 2 - PCV13) 09/04/2007  . INFLUENZA VACCINE  10/28/2016  . MAMMOGRAM  10/07/2018  . DEXA SCAN  Completed    Qualifies for Shingles Vaccine? Discussed shingrix vaccine  Cancer Screenings: Lung: Low Dose CT Chest recommended if Age 28-80 years, 30 pack-year currently smoking OR have quit w/in 15years. Patient does not qualify. Breast: Up to date on Mammogram? Yes   Up to date of Bone Density/Dexa? Yes Colorectal: sent referral to [facility]  Additional Screenings:  Hepatitis B/HIV/Syphillis: not indicated Hepatitis C Screening: not indicated     Plan:    I have personally reviewed and addressed the Medicare Annual Wellness questionnaire and have noted the following in the patient's chart:  A. Medical and social history B. Use of alcohol, tobacco or illicit drugs  C. Current medications and supplements D. Functional ability and status E.  Nutritional status F.  Physical activity G. Advance directives H. List of other physicians I.  Hospitalizations, surgeries, and ER visits in previous 12 months J.  [place] such as hearing and vision if needed, cognitive and depression L. Referrals and appointments   In addition, I have reviewed and discussed with patient certain preventive protocols, quality metrics, and best practice recommendations. A written personalized care plan for preventive services as well as general preventive health recommendations were provided to patient.   Signed,  Tyler Aas, LPN Nurse Health Advisor   Nurse Notes:has had some anxiety since starting new job. Patient has follow up appointment with Cassell Smiles, NP in March, patient understands to call if she needs to be seen interm.

## 2017-04-27 NOTE — Patient Instructions (Addendum)
Helen Mccormick , Thank you for taking time to come for your Medicare Wellness Visit. I appreciate your ongoing commitment to your health goals. Please review the following plan we discussed and let me know if I can assist you in the future.   Screening recommendations/referrals: Colonoscopy: sent referral to [facility] Mammogram: completed 10/06/2016 Bone Density: completed 07/21/2006 Recommended yearly ophthalmology/optometry visit for glaucoma screening and checkup Recommended yearly dental visit for hygiene and checkup  Vaccinations: Influenza vaccine: done today  Pneumococcal vaccine: up to date - waiting for records Tdap vaccine: due, check with your insurance company for coverage Shingles vaccine: due, check with your insurance company for coverage  Advanced directives: Advance directive discussed with you today. I have provided a copy for you to complete at home and have notarized. Once this is complete please bring a copy in to our office so we can scan it into your chart.  Conditions/risks identified: recommend drinking at least 6-7 glasses of water a day   Next appointment: Follow up on 06/10/2017 at 9:20am with Lissa Merlin. Follow up in one year for your annual wellness exam.    Preventive Care 65 Years and Older, Female Preventive care refers to lifestyle choices and visits with your health care provider that can promote health and wellness. What does preventive care include?  A yearly physical exam. This is also called an annual well check.  Dental exams once or twice a year.  Routine eye exams. Ask your health care provider how often you should have your eyes checked.  Personal lifestyle choices, including:  Daily care of your teeth and gums.  Regular physical activity.  Eating a healthy diet.  Avoiding tobacco and drug use.  Limiting alcohol use.  Practicing safe sex.  Taking low-dose aspirin every day.  Taking vitamin and mineral supplements as  recommended by your health care provider. What happens during an annual well check? The services and screenings done by your health care provider during your annual well check will depend on your age, overall health, lifestyle risk factors, and family history of disease. Counseling  Your health care provider may ask you questions about your:  Alcohol use.  Tobacco use.  Drug use.  Emotional well-being.  Home and relationship well-being.  Sexual activity.  Eating habits.  History of falls.  Memory and ability to understand (cognition).  Work and work Statistician.  Reproductive health. Screening  You may have the following tests or measurements:  Height, weight, and BMI.  Blood pressure.  Lipid and cholesterol levels. These may be checked every 5 years, or more frequently if you are over 70 years old.  Skin check.  Lung cancer screening. You may have this screening every year starting at age 53 if you have a 30-pack-year history of smoking and currently smoke or have quit within the past 15 years.  Fecal occult blood test (FOBT) of the stool. You may have this test every year starting at age 102.  Flexible sigmoidoscopy or colonoscopy. You may have a sigmoidoscopy every 5 years or a colonoscopy every 10 years starting at age 24.  Hepatitis C blood test.  Hepatitis B blood test.  Sexually transmitted disease (STD) testing.  Diabetes screening. This is done by checking your blood sugar (glucose) after you have not eaten for a while (fasting). You may have this done every 1-3 years.  Bone density scan. This is done to screen for osteoporosis. You may have this done starting at age 59.  Mammogram. This may  be done every 1-2 years. Talk to your health care provider about how often you should have regular mammograms. Talk with your health care provider about your test results, treatment options, and if necessary, the need for more tests. Vaccines  Your health care  provider may recommend certain vaccines, such as:  Influenza vaccine. This is recommended every year.  Tetanus, diphtheria, and acellular pertussis (Tdap, Td) vaccine. You may need a Td booster every 10 years.  Zoster vaccine. You may need this after age 62.  Pneumococcal 13-valent conjugate (PCV13) vaccine. One dose is recommended after age 100.  Pneumococcal polysaccharide (PPSV23) vaccine. One dose is recommended after age 20. Talk to your health care provider about which screenings and vaccines you need and how often you need them. This information is not intended to replace advice given to you by your health care provider. Make sure you discuss any questions you have with your health care provider. Document Released: 04/12/2015 Document Revised: 12/04/2015 Document Reviewed: 01/15/2015 Elsevier Interactive Patient Education  2017 [employer] Prevention in the Home Falls can cause injuries. They can happen to people of all ages. There are many things you can do to make your home safe and to help prevent falls. What can I do on the outside of my home?  Regularly fix the edges of walkways and driveways and fix any cracks.  Remove anything that might make you trip as you walk through a door, such as a raised step or threshold.  Trim any bushes or trees on the path to your home.  Use bright outdoor lighting.  Clear any walking paths of anything that might make someone trip, such as rocks or tools.  Regularly check to see if handrails are loose or broken. Make sure that both sides of any steps have handrails.  Any raised decks and porches should have guardrails on the edges.  Have any leaves, snow, or ice cleared regularly.  Use sand or salt on walking paths during winter.  Clean up any spills in your garage right away. This includes oil or grease spills. What can I do in the bathroom?  Use night lights.  Install grab bars by the toilet and in the tub and shower. Do  not use towel bars as grab bars.  Use non-skid mats or decals in the tub or shower.  If you need to sit down in the shower, use a plastic, non-slip stool.  Keep the floor dry. Clean up any water that spills on the floor as soon as it happens.  Remove soap buildup in the tub or shower regularly.  Attach bath mats securely with double-sided non-slip rug tape.  Do not have throw rugs and other things on the floor that can make you trip. What can I do in the bedroom?  Use night lights.  Make sure that you have a light by your bed that is easy to reach.  Do not use any sheets or blankets that are too big for your bed. They should not hang down onto the floor.  Have a firm chair that has side arms. You can use this for support while you get dressed.  Do not have throw rugs and other things on the floor that can make you trip. What can I do in the kitchen?  Clean up any spills right away.  Avoid walking on wet floors.  Keep items that you use a lot in easy-to-reach places.  If you need to reach something above you,  use a strong step stool that has a grab bar.  Keep electrical cords out of the way.  Do not use floor polish or wax that makes floors slippery. If you must use wax, use non-skid floor wax.  Do not have throw rugs and other things on the floor that can make you trip. What can I do with my stairs?  Do not leave any items on the stairs.  Make sure that there are handrails on both sides of the stairs and use them. Fix handrails that are broken or loose. Make sure that handrails are as long as the stairways.  Check any carpeting to make sure that it is firmly attached to the stairs. Fix any carpet that is loose or worn.  Avoid having throw rugs at the top or bottom of the stairs. If you do have throw rugs, attach them to the floor with carpet tape.  Make sure that you have a light switch at the top of the stairs and the bottom of the stairs. If you do not have them,  ask someone to add them for you. What else can I do to help prevent falls?  Wear shoes that:  Do not have high heels.  Have rubber bottoms.  Are comfortable and fit you well.  Are closed at the toe. Do not wear sandals.  If you use a stepladder:  Make sure that it is fully opened. Do not climb a closed stepladder.  Make sure that both sides of the stepladder are locked into place.  Ask someone to hold it for you, if possible.  Clearly mark and make sure that you can see:  Any grab bars or handrails.  First and last steps.  Where the edge of each step is.  Use tools that help you move around (mobility aids) if they are needed. These include:  Canes.  Walkers.  Scooters.  Crutches.  Turn on the lights when you go into a dark area. Replace any light bulbs as soon as they burn out.  Set up your furniture so you have a clear path. Avoid moving your furniture around.  If any of your floors are uneven, fix them.  If there are any pets around you, be aware of where they are.  Review your medicines with your doctor. Some medicines can make you feel dizzy. This can increase your chance of falling. Ask your doctor what other things that you can do to help prevent falls. This information is not intended to replace advice given to you by your health care provider. Make sure you discuss any questions you have with your health care provider. Document Released: 01/10/2009 Document Revised: 08/22/2015 Document Reviewed: 04/20/2014 Elsevier Interactive Patient Education  2017 [employer].

## 2017-05-02 ENCOUNTER — Other Ambulatory Visit: Payer: Self-pay

## 2017-05-02 ENCOUNTER — Emergency Department: Payer: Medicare Other

## 2017-05-02 ENCOUNTER — Emergency Department
Admission: EM | Admit: 2017-05-02 | Discharge: 2017-05-02 | Disposition: A | Discharge status: Home / Self Care | Payer: Medicare Other | Attending: Emergency Medicine | Admitting: Emergency Medicine

## 2017-05-02 DIAGNOSIS — S93492A Sprain of other ligament of left ankle, initial encounter: Secondary | ICD-10-CM | POA: Insufficient documentation

## 2017-05-02 DIAGNOSIS — Y9389 Activity, other specified: Secondary | ICD-10-CM | POA: Insufficient documentation

## 2017-05-02 DIAGNOSIS — E119 Type 2 diabetes mellitus without complications: Secondary | ICD-10-CM | POA: Diagnosis not present

## 2017-05-02 DIAGNOSIS — Z79899 Other long term (current) drug therapy: Secondary | ICD-10-CM | POA: Diagnosis not present

## 2017-05-02 DIAGNOSIS — I10 Essential (primary) hypertension: Secondary | ICD-10-CM | POA: Diagnosis not present

## 2017-05-02 DIAGNOSIS — Z7982 Long term (current) use of aspirin: Secondary | ICD-10-CM | POA: Diagnosis not present

## 2017-05-02 DIAGNOSIS — Y9222 Religious institution as the place of occurrence of the external cause: Secondary | ICD-10-CM | POA: Insufficient documentation

## 2017-05-02 DIAGNOSIS — J45909 Unspecified asthma, uncomplicated: Secondary | ICD-10-CM | POA: Insufficient documentation

## 2017-05-02 DIAGNOSIS — W010XXA Fall on same level from slipping, tripping and stumbling without subsequent striking against object, initial encounter: Secondary | ICD-10-CM | POA: Diagnosis not present

## 2017-05-02 DIAGNOSIS — Y999 Unspecified external cause status: Secondary | ICD-10-CM | POA: Insufficient documentation

## 2017-05-02 DIAGNOSIS — E039 Hypothyroidism, unspecified: Secondary | ICD-10-CM | POA: Insufficient documentation

## 2017-05-02 DIAGNOSIS — S99912A Unspecified injury of left ankle, initial encounter: Secondary | ICD-10-CM | POA: Diagnosis present

## 2017-05-02 MED ORDER — PREDNISONE 50 MG PO TABS: 50.0000 mg | ORAL_TABLET | Freq: Every day | ORAL | 0 refills | Status: DC

## 2017-05-02 MED ORDER — PREDNISONE 20 MG PO TABS
60.0000 mg | ORAL_TABLET | Freq: Once | ORAL | Status: AC
  Administered 2017-05-02: 60 mg via ORAL
  Filled 2017-05-02: qty 3

## 2017-05-02 NOTE — ED Triage Notes (Signed)
Pt states this am while ambulating "my leg gave out on me". Pt complains of pain to right knee and left ankle. Slight swelling noted to left ankle. Pt requesting xray of left ankle. Pt is ambulatory.

## 2017-05-02 NOTE — ED Notes (Signed)
Reports walking out from church and left leg gave way and she fell.  Patient complaining of left ankle pain.  Slight swelling noted to left lateral ankle.  Good left pedal pulse.

## 2017-05-02 NOTE — ED Provider Notes (Signed)
[facility] Emergency Department Provider Note  ____________________________________________  Time seen: Approximately 8:29 PM  I have reviewed the triage vital signs and the nursing notes.   HISTORY  Chief Complaint Fall    HPI Helen Mccormick is a 77 y.o. female who presents the emergency department complaining of left ankle pain.  Patient states that she was at church, walking down a flat surface when her left leg "gave way."  Patient states that this caused her to fall.  She did not hit her head or lose consciousness at any time.  She is complaining of left ankle pain but no other musculoskeletal complaints at this time.  Patient reports the ankle appears slightly swollen from baseline.  She is still able to ambulate on same but states that doing so causes increasing pain to the anterolateral aspect of the ankle.  Headache, back pain, chest pain, shortness of breath, vertigo-like symptoms, weakness, numbness or tingling in any extremity.  Past Medical History:  Diagnosis Date  . Allergy   . Anxiety   . Asthma   . Colon polyp   . Depression   . Diabetes mellitus without complication ([place])   . GERD (gastroesophageal reflux disease)   . Heart murmur   . Hyperlipidemia   . Hypertension   . Seizures ([place])   . Sleep apnea   . Thyroid disease     Patient Active Problem List   Diagnosis Date Noted  . Hypertension 03/24/2017  . Prediabetes 03/24/2017  . 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 Hyperlipidemia 03/24/2017    Past Surgical History:  Procedure Laterality Date  . ABDOMINAL HYSTERECTOMY    . BACK SURGERY    . BACK SURGERY    . CERVICAL FUSION    . NECK SURGERY    . THYROID SURGERY    . THYROID SURGERY      Prior to Admission medications   Medication Sig Start Date End Date Taking? Authorizing Provider  albuterol (PROVENTIL HFA;VENTOLIN HFA) 108 (90 Base) MCG/ACT inhaler Inhale into the lungs every 6 (six) hours as needed for  wheezing or shortness of breath.    [provider]  amLODipine (NORVASC) 10 MG tablet Take 10 mg by mouth daily.    [provider]  aspirin 81 MG chewable tablet Chew by mouth daily.    [provider]  atorvastatin (LIPITOR) 20 MG tablet Take 40 mg by mouth at bedtime.     [provider]  benzonatate (TESSALON) 200 MG capsule Take 200 mg by mouth 3 (three) times daily as needed for cough.    [provider]  bisoprolol-hydrochlorothiazide (ZIAC) 2.5-6.25 MG tablet Take 1 tablet by mouth daily.    [provider]  Cholecalciferol (VITAMIN D3) 3000 units TABS Take 25 mcg by mouth.    [provider]  Chromium 1000 MCG TABS Take 1,000 mcg by mouth.    [provider]  ergocalciferol (VITAMIN D2) 50000 units capsule Take 50,000 Units by mouth once a week.    [provider]  escitalopram (LEXAPRO) 10 MG tablet Take 10 mg by mouth daily.     [provider]  ferrous sulfate 325 (65 FE) MG tablet Take 325 mg by mouth daily with breakfast.    [provider]  fluticasone (FLONASE) 50 MCG/ACT nasal spray Place 2 sprays into both nostrils daily.    [provider]  glucose blood test strip Use 1 strip to check your blood sugar two times daily. 03/12/17  [name], NP  levothyroxine (SYNTHROID, LEVOTHROID) 75 MCG tablet Take 75 mcg by mouth daily before breakfast.    [provider]  lubiprostone (AMITIZA) 8 MCG capsule Take 8 mcg by mouth 2 (two) times daily with a meal.    [provider]  naproxen (NAPROSYN) 500 MG tablet Take 1 tablet (500 mg total) by mouth 2 (two) times daily with a meal. Patient not taking: Reported on 04/27/2017 10/20/15   Hagler, Jami L, PA-C  Omega-3 1000 MG CAPS Take by mouth.    [provider]  omeprazole (PRILOSEC) 40 MG capsule Take 40 mg by mouth daily.    [provider]  predniSONE (DELTASONE) 50 MG tablet Take 1  tablet (50 mg total) by mouth daily with breakfast. 05/02/17   Cuthriell, Charline Bills, PA-C  tiZANidine (ZANAFLEX) 2 MG tablet Take by mouth. 04/19/17   [provider]    Allergies Etodolac and Tramadol  Family History  Problem Relation Age of Onset  . Breast cancer Maternal Aunt 60  . Diabetes Mother   . Leukemia Mother   . Diabetes Sister   . Mental illness Sister   . Colon polyps Sister   . Stroke Father   . Cancer Brother        rectal  . Healthy Daughter   . Hypertension Son   . Diabetes Sister   . Thyroid disease Sister   . Healthy Sister   . Healthy Sister   . Stroke Brother   . Healthy Son   . Healthy Son   . Healthy Daughter     Social History Social History   Tobacco Use  . Smoking status: Never Smoker  . Smokeless tobacco: Never Used  Substance Use Topics  . Alcohol use: No  . Drug use: No     Review of Systems  Constitutional: No fever/chills Eyes: No visual changes. Cardiovascular: no chest pain. Respiratory: no cough. No SOB. Gastrointestinal: No abdominal pain.  No nausea, no vomiting.   Musculoskeletal: Positive for left ankle pain. Skin: Negative for rash, abrasions, lacerations, ecchymosis. Neurological: Negative for headaches, focal weakness or numbness. 10-point ROS otherwise negative.  ____________________________________________   PHYSICAL EXAM:  VITAL SIGNS: ED Triage Vitals [05/02/17 1900]  Enc Vitals Group     BP 110/82     Pulse Rate 63     Resp 14     Temp 98.7 F (37.1 C)     Temp Source Oral     SpO2 98 %     Weight 170 lb (77.1 kg)     Height 5\' 4"  (1.626 m)     Head Circumference      Peak Flow      Pain Score 8     Pain Loc      Pain Edu?      Excl. in [place]?      Constitutional: Alert and oriented. Well appearing and in no acute distress. Eyes: Conjunctivae are normal. PERRL. EOMI. Head: Atraumatic. Neck: No stridor.    Cardiovascular: Normal rate, regular rhythm. Normal S1 and S2.  Good peripheral  circulation. Respiratory: Normal respiratory effort without tachypnea or retractions. Lungs CTAB. Good air entry to the bases with no decreased or absent breath sounds. Musculoskeletal: Full range of motion to all extremities. No gross deformities appreciated.  Visualizing left ankle reveals mild edema to the left anterolateral aspect.  Patient has full range of motion to the ankle.  No acute deformity, ecchymosis, abrasions or lacerations identified.  Patient is tender to palpation along the anterior talofibular ligament distribution.  No palpable abnormality.  Dorsalis pedis pulse intact.  Sensation intact all 5 digits.  Capillary refill less than 2 seconds all digits.  Examination of the knee, hip, lumbar spine is unremarkable. Neurologic:  Normal speech and language. No gross focal neurologic deficits are appreciated.  Skin:  Skin is warm, dry and intact. No rash noted. Psychiatric: Mood and affect are normal. Speech and behavior are normal. Patient exhibits appropriate insight and judgement.   ____________________________________________   LABS (all labs ordered are listed, but only abnormal results are displayed)  Labs Reviewed - No data to display ____________________________________________  EKG   ____________________________________________  RADIOLOGY Diamantina Providence Cuthriell, personally viewed and evaluated these images (plain radiographs) as part of my medical decision making, as well as reviewing the written report by the radiologist.  Occur with radiologist finding of no acute osseous abnormality.  Dg Ankle Complete Left  Result Date: 05/02/2017 CLINICAL DATA:  Leg gave out while ambulating. EXAM: LEFT ANKLE COMPLETE - 3+ VIEW COMPARISON:  None. FINDINGS: There is no evidence of fracture, dislocation, or joint effusion. Intact ankle mortise. Plantar and dorsal calcaneal enthesophytes are present. There is no evidence of arthropathy or other focal bone abnormality. Soft tissues  are unremarkable. IMPRESSION: No acute osseous abnormality.  Calcaneal enthesopathy. Electronically Signed   By: Ashley Royalty M.D.   On: 05/02/2017 19:34    ____________________________________________    PROCEDURES  Procedure(s) performed:    Procedures    Medications  predniSONE (DELTASONE) tablet 60 mg (not administered)     ____________________________________________   INITIAL IMPRESSION / ASSESSMENT AND PLAN / ED COURSE  Pertinent labs & imaging results that were available during my care of the patient were reviewed by me and considered in my medical decision making (see chart for details).  Review of the [place] CSRS was performed in accordance of the [place] prior to dispensing any controlled drugs.     Patient's diagnosis is consistent with left ankle sprain.  Patient presented to the emergency department complaining of left ankle pain.  Patient reports that her leg "gave way" at church.  She denies hitting her head or losing consciousness.  Exam is very at this time, x-ray reveals no acute osseous abnormality.  No indication for acute ligamentous rupture.  Ace bandage provided to patient.. Patient will be discharged home with prescriptions for short steroid course for inflammation control.. Patient is to follow up with primary care as needed or otherwise directed. Patient is given ED precautions to return to the ED for any worsening or new symptoms.     ____________________________________________  FINAL CLINICAL IMPRESSION(S) / ED DIAGNOSES  Final diagnoses:  Sprain of anterior talofibular ligament of left ankle, initial encounter      NEW MEDICATIONS STARTED DURING THIS VISIT:  ED Discharge Orders        Ordered    predniSONE (DELTASONE) 50 MG tablet  Daily with breakfast     05/02/17 2033          This chart was dictated using voice recognition software/Dragon. Despite best efforts to proofread, errors can occur which can change the meaning. Any change  was purely unintentional.    Brynda Peon 05/02/17 2034    Orbie Pyo, MD 05/02/17 [phone]

## 2017-05-07 ENCOUNTER — Other Ambulatory Visit: Payer: Self-pay | Admitting: Internal Medicine

## 2017-05-11 ENCOUNTER — Telehealth: Payer: Self-pay

## 2017-05-11 ENCOUNTER — Other Ambulatory Visit: Payer: Self-pay | Admitting: Nurse Practitioner

## 2017-05-11 ENCOUNTER — Other Ambulatory Visit: Payer: Self-pay

## 2017-05-11 ENCOUNTER — Other Ambulatory Visit: Payer: Self-pay | Admitting: Internal Medicine

## 2017-05-11 DIAGNOSIS — Z1211 Encounter for screening for malignant neoplasm of colon: Secondary | ICD-10-CM

## 2017-05-11 NOTE — Telephone Encounter (Signed)
Gastroenterology Pre-Procedure Review  Request Date:   Requesting Physician: Dr.    PATIENT REVIEW QUESTIONS: The patient responded to the following health history questions as indicated:    1. Are you having any GI issues? No  2. Do you have a personal history of Polyps? Yes, not sure what kind 3. Do you have a family history of Colon Cancer or Polyps? No  4. Diabetes Mellitus? No  5. Joint replacements in the past 12 months? No  6. Major health problems in the past 3 months? No  7. Any artificial heart valves, MVP, or defibrillator? No     MEDICATIONS & ALLERGIES:    Patient reports the following regarding taking any anticoagulation/antiplatelet therapy:   Plavix, Coumadin, Eliquis, Xarelto, Lovenox, Pradaxa, Brilinta, or Effient? No  Aspirin? Yes   Patient confirms/reports the following medications:  Current Outpatient Medications  Medication Sig Dispense Refill  . albuterol (PROVENTIL HFA;VENTOLIN HFA) 108 (90 Base) MCG/ACT inhaler Inhale into the lungs every 6 (six) hours as needed for wheezing or shortness of breath.    Marland Kitchen amLODipine (NORVASC) 10 MG tablet Take 10 mg by mouth daily.    Marland Kitchen aspirin 81 MG chewable tablet Chew by mouth daily.    Marland Kitchen atorvastatin (LIPITOR) 20 MG tablet Take 40 mg by mouth at bedtime.     . benzonatate (TESSALON) 200 MG capsule Take 200 mg by mouth 3 (three) times daily as needed for cough.    . bisoprolol-hydrochlorothiazide (ZIAC) 2.5-6.25 MG tablet Take 1 tablet by mouth daily.    . Cholecalciferol (VITAMIN D3) 3000 units TABS Take 25 mcg by mouth.    . Chromium 1000 MCG TABS Take 1,000 mcg by mouth.    . ergocalciferol (VITAMIN D2) 50000 units capsule Take 50,000 Units by mouth once a week.    . escitalopram (LEXAPRO) 10 MG tablet Take 10 mg by mouth daily.     . ferrous sulfate 325 (65 FE) MG tablet Take 325 mg by mouth daily with breakfast.    . fluticasone (FLONASE) 50 MCG/ACT nasal spray Place 2 sprays into both nostrils daily.    Marland Kitchen glucose  blood test strip Use 1 strip to check your blood sugar two times daily. 100 each 12  . levothyroxine (SYNTHROID, LEVOTHROID) 75 MCG tablet Take 75 mcg by mouth daily before breakfast.    . lubiprostone (AMITIZA) 8 MCG capsule Take 8 mcg by mouth 2 (two) times daily with a meal.    . naproxen (NAPROSYN) 500 MG tablet Take 1 tablet (500 mg total) by mouth 2 (two) times daily with a meal. (Patient not taking: Reported on 04/27/2017) 14 tablet 0  . Omega-3 1000 MG CAPS Take by mouth.    Marland Kitchen omeprazole (PRILOSEC) 40 MG capsule Take 40 mg by mouth daily.    . predniSONE (DELTASONE) 50 MG tablet Take 1 tablet (50 mg total) by mouth daily with breakfast. 5 tablet 0  . tiZANidine (ZANAFLEX) 2 MG tablet Take by mouth.     No current facility-administered medications for this visit.     Patient confirms/reports the following allergies:  Allergies  Allergen Reactions  . Etodolac     Other reaction(s): Abdominal Pain, Other (See Comments)  . Tramadol Other (See Comments)    dizziness     No orders of the defined types were placed in this encounter.   AUTHORIZATION INFORMATION Primary Insurance: 1D#: Group #:  Secondary Insurance: 1D#: Group #:  SCHEDULE INFORMATION: Date: 05/28/17 Time: Location: [facility]

## 2017-05-25 ENCOUNTER — Telehealth: Payer: Self-pay | Admitting: Nurse Practitioner

## 2017-05-25 NOTE — Telephone Encounter (Signed)
Spoke with Helen Mccormick, her Pharmacy did not have the kit available over the weekend. She is waiting for them to call her back once it is ready. Believes insurance should cover this if it doesn't advised her to discuss with [facility]. knb

## 2017-05-27 ENCOUNTER — Other Ambulatory Visit: Payer: Self-pay | Admitting: Gastroenterology

## 2017-05-27 MED ORDER — PEG 3350-KCL-NA BICARB-NACL 420 G PO SOLR: 4000.0000 mL | Freq: Once | ORAL | 0 refills | Status: AC

## 2017-05-27 NOTE — Progress Notes (Signed)
Bowel prep for procedure tomorrow

## 2017-05-28 ENCOUNTER — Encounter: Payer: Self-pay | Admitting: *Deleted

## 2017-05-28 ENCOUNTER — Ambulatory Visit
Admission: RE | Admit: 2017-05-28 | Discharge: 2017-05-28 | Disposition: A | Discharge status: Home / Self Care | Payer: Medicare Other | Source: Ambulatory Visit | Attending: Gastroenterology | Admitting: Gastroenterology

## 2017-05-28 ENCOUNTER — Encounter
Admission: RE | Disposition: A | Discharge status: Home / Self Care | Payer: Self-pay | Source: Ambulatory Visit | Attending: Gastroenterology

## 2017-05-28 ENCOUNTER — Ambulatory Visit: Payer: Medicare Other | Admitting: Anesthesiology

## 2017-05-28 DIAGNOSIS — E079 Disorder of thyroid, unspecified: Secondary | ICD-10-CM | POA: Diagnosis not present

## 2017-05-28 DIAGNOSIS — G4730 Sleep apnea, unspecified: Secondary | ICD-10-CM | POA: Diagnosis not present

## 2017-05-28 DIAGNOSIS — F419 Anxiety disorder, unspecified: Secondary | ICD-10-CM | POA: Diagnosis not present

## 2017-05-28 DIAGNOSIS — K640 First degree hemorrhoids: Secondary | ICD-10-CM | POA: Diagnosis not present

## 2017-05-28 DIAGNOSIS — I10 Essential (primary) hypertension: Secondary | ICD-10-CM | POA: Insufficient documentation

## 2017-05-28 DIAGNOSIS — D123 Benign neoplasm of transverse colon: Secondary | ICD-10-CM | POA: Insufficient documentation

## 2017-05-28 DIAGNOSIS — K219 Gastro-esophageal reflux disease without esophagitis: Secondary | ICD-10-CM | POA: Insufficient documentation

## 2017-05-28 DIAGNOSIS — D120 Benign neoplasm of cecum: Secondary | ICD-10-CM

## 2017-05-28 DIAGNOSIS — K5730 Diverticulosis of large intestine without perforation or abscess without bleeding: Secondary | ICD-10-CM | POA: Diagnosis not present

## 2017-05-28 DIAGNOSIS — D124 Benign neoplasm of descending colon: Secondary | ICD-10-CM | POA: Diagnosis not present

## 2017-05-28 DIAGNOSIS — J45909 Unspecified asthma, uncomplicated: Secondary | ICD-10-CM | POA: Diagnosis not present

## 2017-05-28 DIAGNOSIS — Z791 Long term (current) use of non-steroidal anti-inflammatories (NSAID): Secondary | ICD-10-CM | POA: Diagnosis not present

## 2017-05-28 DIAGNOSIS — Z79899 Other long term (current) drug therapy: Secondary | ICD-10-CM | POA: Insufficient documentation

## 2017-05-28 DIAGNOSIS — E119 Type 2 diabetes mellitus without complications: Secondary | ICD-10-CM | POA: Diagnosis not present

## 2017-05-28 DIAGNOSIS — Z888 Allergy status to other drugs, medicaments and biological substances status: Secondary | ICD-10-CM | POA: Diagnosis not present

## 2017-05-28 DIAGNOSIS — F329 Major depressive disorder, single episode, unspecified: Secondary | ICD-10-CM | POA: Diagnosis not present

## 2017-05-28 DIAGNOSIS — Z7982 Long term (current) use of aspirin: Secondary | ICD-10-CM | POA: Diagnosis not present

## 2017-05-28 DIAGNOSIS — E785 Hyperlipidemia, unspecified: Secondary | ICD-10-CM | POA: Insufficient documentation

## 2017-05-28 DIAGNOSIS — Z885 Allergy status to narcotic agent status: Secondary | ICD-10-CM | POA: Diagnosis not present

## 2017-05-28 DIAGNOSIS — D122 Benign neoplasm of ascending colon: Secondary | ICD-10-CM | POA: Diagnosis not present

## 2017-05-28 DIAGNOSIS — Z1211 Encounter for screening for malignant neoplasm of colon: Secondary | ICD-10-CM | POA: Insufficient documentation

## 2017-05-28 HISTORY — PX: COLONOSCOPY WITH PROPOFOL: SHX5780

## 2017-05-28 LAB — GLUCOSE, CAPILLARY: Glucose-Capillary: 111 mg/dL — ABNORMAL HIGH (ref 65–99)

## 2017-05-28 SURGERY — COLONOSCOPY WITH PROPOFOL
Anesthesia: General

## 2017-05-28 MED ORDER — PROPOFOL 500 MG/50ML IV EMUL
INTRAVENOUS | Status: DC | PRN
  Administered 2017-05-28: 160 ug/kg/min via INTRAVENOUS

## 2017-05-28 MED ORDER — PROPOFOL 500 MG/50ML IV EMUL
INTRAVENOUS | Status: AC
  Filled 2017-05-28: qty 50

## 2017-05-28 MED ORDER — SODIUM CHLORIDE 0.9 % IV SOLN
INTRAVENOUS | Status: DC
  Administered 2017-05-28 (×2): via INTRAVENOUS

## 2017-05-28 MED ORDER — PROPOFOL 10 MG/ML IV BOLUS
INTRAVENOUS | Status: DC | PRN
  Administered 2017-05-28: 70 mg via INTRAVENOUS
  Administered 2017-05-28: 30 mg via INTRAVENOUS

## 2017-05-28 NOTE — Anesthesia Postprocedure Evaluation (Signed)
Anesthesia Post Note  Patient: Helen Mccormick  Procedure(s) Performed: COLONOSCOPY WITH PROPOFOL (N/A )  Patient location during evaluation: PACU Anesthesia Type: General Level of consciousness: awake and alert Pain management: pain level controlled Vital Signs Assessment: post-procedure vital signs reviewed and stable Respiratory status: spontaneous breathing, nonlabored ventilation, respiratory function stable and patient connected to nasal cannula oxygen Cardiovascular status: blood pressure returned to baseline and stable Postop Assessment: no apparent nausea or vomiting Anesthetic complications: no     Last Vitals:  Vitals:   05/28/17 0908 05/28/17 1127  BP: (!) 148/68 111/70  Pulse: 60 74  Resp: 16 11  Temp: (!) 36.1 C (!) 35.9 C  SpO2: 100% 94%    Last Pain:  Vitals:   05/28/17 1127  TempSrc: Tympanic  PainSc: [name]

## 2017-05-28 NOTE — H&P (Signed)
Jonathon Bellows, MD [address] [place], [place], [place], [address] [id number], [place], [place], [place], [address] Phone: [phone]  Fax: [phone]  Primary Care Physician:  [name], NP   Pre-Procedure History & Physical: HPI:  Helen Mccormick is a 77 y.o. female is here for an colonoscopy.   Past Medical History:  Diagnosis Date  . Allergy   . Anxiety   . Asthma   . Colon polyp   . Depression   . Diabetes mellitus without complication ([place])   . GERD (gastroesophageal reflux disease)   . Heart murmur   . Hyperlipidemia   . Hypertension   . Sleep apnea   . Thyroid disease     Past Surgical History:  Procedure Laterality Date  . ABDOMINAL HYSTERECTOMY    . BACK SURGERY    . BACK SURGERY    . CERVICAL FUSION    . NECK SURGERY    . THYROID SURGERY    . THYROID SURGERY      Prior to Admission medications   Medication Sig Start Date End Date Taking? Authorizing Provider  levothyroxine (SYNTHROID, LEVOTHROID) 75 MCG tablet Take 75 mcg by mouth daily before breakfast.   Yes [provider]  albuterol (PROVENTIL HFA;VENTOLIN HFA) 108 (90 Base) MCG/ACT inhaler Inhale into the lungs every 6 (six) hours as needed for wheezing or shortness of breath.    [provider]  amLODipine (NORVASC) 10 MG tablet Take 10 mg by mouth daily.    [provider]  aspirin 81 MG chewable tablet Chew by mouth daily.    [provider]  atorvastatin (LIPITOR) 20 MG tablet Take 40 mg by mouth at bedtime.     [provider]  benzonatate (TESSALON) 200 MG capsule Take 200 mg by mouth 3 (three) times daily as needed for cough.    [provider]  bisoprolol-hydrochlorothiazide (ZIAC) 2.5-6.25 MG tablet TAKE 1 TABLET BY MOUTH EVERY DAY FOR BLOOD PRESSURE Patient not taking: Reported on 05/28/2017 05/11/17   [name], NP  Cholecalciferol (VITAMIN D3) 3000 units TABS Take 25 mcg by mouth.    [provider]  Chromium 1000 MCG TABS Take 1,000 mcg by mouth.    [provider]  ergocalciferol (VITAMIN D2) 50000 units capsule Take 50,000 Units by mouth once a week.    [provider]  escitalopram (LEXAPRO) 10 MG tablet TAKE 1 TABLET BY MOUTH EVERY DAY FOR DEPRESSION 05/11/17   [name], NP  ferrous sulfate 325 (65 FE) MG tablet Take 325 mg by mouth daily with breakfast.    [provider]  fluticasone (FLONASE) 50 MCG/ACT nasal spray Place 2 sprays into both nostrils daily.    [provider]  glucose blood test strip Use 1 strip to check your blood sugar two times daily. 03/12/17   [name], NP  lubiprostone (AMITIZA) 8 MCG capsule Take 8 mcg by mouth 2 (two) times daily with a meal.    [provider]  naproxen (NAPROSYN) 500 MG tablet Take 1 tablet (500 mg total) by mouth 2 (two) times daily with a meal. Patient not taking: Reported on 04/27/2017 10/20/15   Hagler, Jami L, PA-C  Omega-3 1000 MG CAPS Take by mouth.    [provider]  omeprazole (PRILOSEC) 40 MG capsule Take 40 mg by mouth daily.    [provider]  predniSONE (DELTASONE) 50 MG tablet Take 1 tablet (50 mg total) by mouth  daily with breakfast. Patient not taking: Reported on 05/28/2017 05/02/17   Cuthriell, Charline Bills, PA-C  tiZANidine (ZANAFLEX) 2 MG tablet Take by mouth. 04/19/17   [provider]    Allergies as of 05/11/2017 - Review Complete 05/11/2017  Allergen Reaction Noted  . Etodolac  04/22/2016  . Tramadol Other (See Comments)     Family History  Problem Relation Age of Onset  . Breast cancer Maternal Aunt 60  . Diabetes Mother   . Leukemia Mother   . Diabetes Sister   . Mental illness Sister   . Colon polyps Sister   . Stroke Father   . Cancer Brother        rectal  . Healthy Daughter   . Hypertension Son   . Diabetes Sister   . Thyroid disease Sister   . Healthy Sister   . Healthy Sister   . Stroke Brother   .  Healthy Son   . Healthy Son   . Healthy Daughter     Social History   Socioeconomic History  . Marital status: Single    Spouse name: Not on file  . Number of children: Not on file  . Years of education: Not on file  . Highest education level: Not on file  Social Needs  . Financial resource strain: Not hard at all  . Food insecurity - worry: Never true  . Food insecurity - inability: Never true  . Transportation needs - medical: No  . Transportation needs - non-medical: No  Occupational History  . Not on file  Tobacco Use  . Smoking status: Never Smoker  . Smokeless tobacco: Never Used  Substance and Sexual Activity  . Alcohol use: No  . Drug use: No  . Sexual activity: Not Currently  Other Topics Concern  . Not on file  Social History Narrative  . Not on file    Review of Systems: See HPI, otherwise negative ROS  Physical Exam: BP (!) 148/68   Pulse 60   Temp (!) 97 F (36.1 C) (Tympanic)   Resp 16   Ht 5\' 4"  (1.626 m)   Wt 172 lb (78 kg)   SpO2 100%   BMI 29.52 kg/m  General:   Alert,  pleasant and cooperative in NAD Head:  Normocephalic and atraumatic. Neck:  Supple; no masses or thyromegaly. Lungs:  Clear throughout to auscultation, normal respiratory effort.    Heart:  +S1, +S2, Regular rate and rhythm, No edema. Abdomen:  Soft, nontender and nondistended. Normal bowel sounds, without guarding, and without rebound.   Neurologic:  Alert and  oriented x4;  grossly normal neurologically.  Impression/Plan: Helen Mccormick is here for an colonoscopy to be performed for surveillance due to prior history of colon polyps   Risks, benefits, limitations, and alternatives regarding  colonoscopy have been reviewed with the patient.  Questions have been answered.  All parties agreeable.   Jonathon Bellows, MD  05/28/2017, 10:47 AM

## 2017-05-28 NOTE — Anesthesia Post-op Follow-up Note (Signed)
Anesthesia QCDR form completed.        

## 2017-05-28 NOTE — Anesthesia Procedure Notes (Signed)
Date/Time: 05/28/2017 11:01 AM Performed by: Nelda Marseille, CRNA Pre-anesthesia Checklist: Patient identified, Emergency Drugs available, Suction available, Patient being monitored and Timeout performed Oxygen Delivery Method: Nasal cannula

## 2017-05-28 NOTE — Anesthesia Preprocedure Evaluation (Signed)
Anesthesia Evaluation  Patient identified by MRN, date of birth, ID band Patient awake    Reviewed: Allergy & Precautions, H&P , NPO status , Patient's Chart, lab work & pertinent test results, reviewed documented beta blocker date and time   Airway Mallampati: II   Neck ROM: full    Dental  (+) Poor Dentition   Pulmonary neg pulmonary ROS,    Pulmonary exam normal        Cardiovascular Exercise Tolerance: Poor hypertension, On Medications negative cardio ROS Normal cardiovascular exam+ Valvular Problems/Murmurs  Rhythm:regular Rate:Normal     Neuro/Psych PSYCHIATRIC DISORDERS Anxiety Depression negative neurological ROS  negative psych ROS   GI/Hepatic negative GI ROS, Neg liver ROS, GERD  Medicated,  Endo/Other  negative endocrine ROSdiabetesHypothyroidism   Renal/GU negative Renal ROS  negative genitourinary   Musculoskeletal   Abdominal   Peds  Hematology negative hematology ROS (+)   Anesthesia Other Findings Past Medical History: No date: Allergy No date: Anxiety No date: Asthma No date: Colon polyp No date: Depression No date: Diabetes mellitus without complication ([facility]) No date: GERD (gastroesophageal reflux disease) No date: Heart murmur No date: Hyperlipidemia No date: Hypertension No date: Sleep apnea No date: Thyroid disease Past Surgical History: No date: ABDOMINAL HYSTERECTOMY No date: BACK SURGERY No date: BACK SURGERY No date: CERVICAL FUSION No date: NECK SURGERY No date: THYROID SURGERY No date: THYROID SURGERY BMI    Body Mass Index:  29.52 kg/m     Reproductive/Obstetrics negative OB ROS                             Anesthesia Physical Anesthesia Plan  ASA: III  Anesthesia Plan: General   Post-op Pain Management:    Induction:   PONV Risk Score and Plan:   Airway Management Planned:   Additional Equipment:   Intra-op Plan:    Post-operative Plan:   Informed Consent: I have reviewed the patients History and Physical, chart, labs and discussed the procedure including the risks, benefits and alternatives for the proposed anesthesia with the patient or authorized representative who has indicated his/her understanding and acceptance.   Dental Advisory Given  Plan Discussed with: CRNA  Anesthesia Plan Comments:         Anesthesia Quick Evaluation

## 2017-05-28 NOTE — Op Note (Signed)
[facility] Patient Name: Helen Mccormick Procedure Date: 05/28/2017 10:36 AM MRN: [id number] Account #: [id number] Date of Birth: 01/18/1941 Admit Type: Outpatient Age: 77 Room: [facility] ENDO ROOM 4 Gender: Female Note Status: Finalized Procedure:            Colonoscopy Indications:          High risk colon cancer surveillance: Personal history                        of colonic polyps Providers:            Jonathon Bellows MD, MD Referring MD:         Cassell Smiles at [place]:            Monitored Anesthesia Care Complications:        No immediate complications. Procedure:            Pre-Anesthesia Assessment:                       - Prior to the procedure, a History and Physical was                        performed, and patient medications, allergies and                        sensitivities were reviewed. The patient's tolerance of                        previous anesthesia was reviewed.                       - The risks and benefits of the procedure and the                        sedation options and risks were discussed with the                        patient. All questions were answered and informed                        consent was obtained.                       - ASA Grade Assessment: II - A patient with mild                        systemic disease.                       After obtaining informed consent, the colonoscope was                        passed under direct vision. Throughout the procedure,                        the patient's blood pressure, pulse, and oxygen                        saturations were monitored continuously. The  Colonoscope was introduced through the anus and                        advanced to the the cecum, identified by the                        appendiceal orifice, IC valve and transillumination.                        The colonoscopy was performed without difficulty. The                     patient tolerated the procedure well. The quality of                        the bowel preparation was good. Findings:      The perianal and digital rectal examinations were normal.      Multiple small-mouthed diverticula were found in the sigmoid colon.      Three sessile polyps were found in the transverse colon, ascending colon       and cecum. The polyps were 4 to 5 mm in size. These polyps were removed       with a cold biopsy forceps. Resection and retrieval were complete.      A 7 mm polyp was found in the descending colon. The polyp was sessile.       The polyp was removed with a cold snare. Resection and retrieval were       complete.      The exam was otherwise without abnormality on direct and retroflexion       views. Impression:           - Diverticulosis in the sigmoid colon.                       - Three 4 to 5 mm polyps in the transverse colon, in                        the ascending colon and in the cecum, removed with a                        cold biopsy forceps. Resected and retrieved.                       - One 7 mm polyp in the descending colon, removed with                        a cold snare. Resected and retrieved.                       - The examination was otherwise normal on direct and                        retroflexion views. Recommendation:       - Discharge patient to home (with escort).                       - Resume previous diet.                       - Continue present medications.                       -  Await pathology results.                       - Repeat colonoscopy in 3 - 5 years for surveillance. Procedure Code(s):    --- Professional ---                       [phone], Colonoscopy, flexible; with removal of tumor(s),                        polyp(s), or other lesion(s) by snare technique                       45380, 39, Colonoscopy, flexible; with biopsy, single                        or multiple Diagnosis Code(s):    ---  Professional ---                       Z86.010, Personal history of colonic polyps                       D12.3, Benign neoplasm of transverse colon (hepatic                        flexure or splenic flexure)                       D12.2, Benign neoplasm of ascending colon                       D12.0, Benign neoplasm of cecum                       D12.4, Benign neoplasm of descending colon                       K57.30, Diverticulosis of large intestine without                        perforation or abscess without bleeding CPT copyright 2016 American Medical Association. All rights reserved. The codes documented in this report are preliminary and upon coder review may  be revised to meet current compliance requirements. Jonathon Bellows, MD Jonathon Bellows MD, MD 05/28/2017 11:25:57 AM This report has been signed electronically. Number of Addenda: 0 Note Initiated On: 05/28/2017 10:36 AM Scope Withdrawal Time: 0 hours 21 minutes 36 seconds  Total Procedure Duration: 0 hours 30 minutes 11 seconds       [facility]

## 2017-05-28 NOTE — Transfer of Care (Signed)
Immediate Anesthesia Transfer of Care Note  Patient: Helen Mccormick  Procedure(s) Performed: COLONOSCOPY WITH PROPOFOL (N/A )  Patient Location: PACU  Anesthesia Type:General  Level of Consciousness: awake and sedated  Airway & Oxygen Therapy: Patient Spontanous Breathing and Patient connected to nasal cannula oxygen  Post-op Assessment: Report given to RN and Post -op Vital signs reviewed and stable  Post vital signs: Reviewed and stable  Last Vitals:  Vitals:   05/28/17 0908 05/28/17 1127  BP: (!) 148/68 111/70  Pulse: 60 74  Resp: 16 11  Temp: (!) 36.1 C (!) 35.9 C  SpO2: 100% 94%    Last Pain:  Vitals:   05/28/17 1127  TempSrc: Tympanic  PainSc:          Complications: No apparent anesthesia complications

## 2017-05-29 ENCOUNTER — Encounter: Payer: Self-pay | Admitting: Gastroenterology

## 2017-05-31 ENCOUNTER — Other Ambulatory Visit: Payer: Self-pay

## 2017-05-31 ENCOUNTER — Emergency Department: Payer: Medicare Other

## 2017-05-31 ENCOUNTER — Emergency Department
Admission: EM | Admit: 2017-05-31 | Discharge: 2017-05-31 | Disposition: A | Discharge status: Home / Self Care | Payer: Medicare Other | Attending: Emergency Medicine | Admitting: Emergency Medicine

## 2017-05-31 ENCOUNTER — Telehealth: Payer: Self-pay | Admitting: Nurse Practitioner

## 2017-05-31 DIAGNOSIS — F419 Anxiety disorder, unspecified: Secondary | ICD-10-CM | POA: Insufficient documentation

## 2017-05-31 DIAGNOSIS — F329 Major depressive disorder, single episode, unspecified: Secondary | ICD-10-CM | POA: Insufficient documentation

## 2017-05-31 DIAGNOSIS — I10 Essential (primary) hypertension: Secondary | ICD-10-CM | POA: Diagnosis not present

## 2017-05-31 DIAGNOSIS — J45909 Unspecified asthma, uncomplicated: Secondary | ICD-10-CM | POA: Diagnosis not present

## 2017-05-31 DIAGNOSIS — B349 Viral infection, unspecified: Secondary | ICD-10-CM

## 2017-05-31 DIAGNOSIS — K5900 Constipation, unspecified: Secondary | ICD-10-CM

## 2017-05-31 DIAGNOSIS — E119 Type 2 diabetes mellitus without complications: Secondary | ICD-10-CM | POA: Insufficient documentation

## 2017-05-31 DIAGNOSIS — M549 Dorsalgia, unspecified: Secondary | ICD-10-CM | POA: Diagnosis present

## 2017-05-31 LAB — COMPREHENSIVE METABOLIC PANEL
ALT: 26 U/L (ref 14–54)
AST: 29 U/L (ref 15–41)
Albumin: 3.6 g/dL (ref 3.5–5.0)
Alkaline Phosphatase: 92 U/L (ref 38–126)
Anion gap: 8 (ref 5–15)
BUN: 21 mg/dL — ABNORMAL HIGH (ref 6–20)
CO2: 29 mmol/L (ref 22–32)
Calcium: 9.1 mg/dL (ref 8.9–10.3)
Chloride: 105 mmol/L (ref 101–111)
Creatinine, Ser: 0.92 mg/dL (ref 0.44–1.00)
GFR calc Af Amer: >60 mL/min (ref >60)
GFR calc non Af Amer: 60 mL/min — ABNORMAL LOW (ref >60)
Glucose, Bld: 118 mg/dL — ABNORMAL HIGH (ref 65–99)
Potassium: 3.2 mmol/L — ABNORMAL LOW (ref 3.5–5.1)
Sodium: 142 mmol/L (ref 135–145)
Total Bilirubin: 0.5 mg/dL (ref 0.3–1.2)
Total Protein: 7.3 g/dL (ref 6.5–8.1)

## 2017-05-31 LAB — URINALYSIS, COMPLETE (UACMP) WITH MICROSCOPIC
Bacteria, UA: NONE SEEN
Bilirubin Urine: NEGATIVE
Glucose, UA: NEGATIVE mg/dL
Hgb urine dipstick: NEGATIVE
Ketones, ur: NEGATIVE mg/dL
Leukocytes, UA: NEGATIVE
Nitrite: NEGATIVE
Protein, ur: NEGATIVE mg/dL
Specific Gravity, Urine: 1.02 (ref 1.005–1.030)
pH: 5 (ref 5.0–8.0)

## 2017-05-31 LAB — CBC
HCT: 36.8 % (ref 35.0–47.0)
Hemoglobin: 11.7 g/dL — ABNORMAL LOW (ref 12.0–16.0)
MCH: 24.5 pg — ABNORMAL LOW (ref 26.0–34.0)
MCHC: 31.8 g/dL — ABNORMAL LOW (ref 32.0–36.0)
MCV: 77.1 fL — ABNORMAL LOW (ref 80.0–100.0)
Platelets: 228 10*3/uL (ref 150–440)
RBC: 4.77 MIL/uL (ref 3.80–5.20)
RDW: 16 % — ABNORMAL HIGH (ref 11.5–14.5)
WBC: 6.1 10*3/uL (ref 3.6–11.0)

## 2017-05-31 LAB — INFLUENZA PANEL BY PCR (TYPE A & B)
Influenza A By PCR: NEGATIVE
Influenza B By PCR: NEGATIVE

## 2017-05-31 LAB — SURGICAL PATHOLOGY

## 2017-05-31 LAB — TROPONIN I: Troponin I: <0.03 ng/mL (ref <0.03)

## 2017-05-31 MED ORDER — POLYETHYLENE GLYCOL 3350 17 GM/SCOOP PO POWD: 1.0000 | Freq: Once | ORAL | 0 refills | Status: AC

## 2017-05-31 MED ORDER — LEVOTHYROXINE SODIUM 75 MCG PO TABS: 75.0000 ug | ORAL_TABLET | Freq: Every day | ORAL | 3 refills | Status: DC

## 2017-05-31 NOTE — ED Provider Notes (Signed)
[facility] Emergency Department Provider Note       Time seen: ----------------------------------------- 7:22 AM on 05/31/2017 -----------------------------------------   I have reviewed the triage vital signs and the nursing notes.  HISTORY   Chief Complaint Abdominal Pain    HPI Helen Mccormick is a 77 y.o. female with a history of allergies, depression, diabetes, GERD, hyperlipidemia, hypertension and thyroid disease who presents to the ED for upper back pain, nausea and chills.  She has had one episode of diarrhea but no vomiting.  She underwent a colonoscopy on Friday.  She thinks she had fever and chills at home.  She is not in any pain at this time.  Past Medical History:  Diagnosis Date  . Allergy   . Anxiety   . Asthma   . Colon polyp   . Depression   . Diabetes mellitus without complication ([place])   . GERD (gastroesophageal reflux disease)   . Heart murmur   . Hyperlipidemia   . Hypertension   . Sleep apnea   . Thyroid disease     Patient Active Problem List   Diagnosis Date Noted  . Hypertension 03/24/2017  . Prediabetes 03/24/2017  . 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 Hyperlipidemia 03/24/2017    Past Surgical History:  Procedure Laterality Date  . ABDOMINAL HYSTERECTOMY    . BACK SURGERY    . BACK SURGERY    . CERVICAL FUSION    . COLONOSCOPY WITH PROPOFOL N/A 05/28/2017   Procedure: COLONOSCOPY WITH PROPOFOL;  Surgeon: Jonathon Bellows, MD;  Location: [facility] ENDOSCOPY;  Service: Gastroenterology;  Laterality: N/A;  . NECK SURGERY    . THYROID SURGERY    . THYROID SURGERY      Allergies Etodolac and Tramadol  Social History Social History   Tobacco Use  . Smoking status: Never Smoker  . Smokeless tobacco: Never Used  Substance Use Topics  . Alcohol use: No  . Drug use: No    Review of Systems Constitutional: Positive for fevers, chills Eyes: Negative for vision changes ENT:  Negative for congestion, sore  throat Cardiovascular: Negative for chest pain. Respiratory: Negative for shortness of breath. Gastrointestinal: Negative for abdominal pain, positive for nausea and diarrhea Musculoskeletal: Positive for back pain Skin: Negative for rash. Neurological: Negative for headaches, focal weakness or numbness.  All systems negative/normal/unremarkable except as stated in the HPI  ____________________________________________   PHYSICAL EXAM:  VITAL SIGNS: ED Triage Vitals [05/31/17 0602]  Enc Vitals Group     BP (!) 128/55     Pulse Rate (!) 58     Resp 14     Temp 98.2 F (36.8 C)     Temp Source Oral     SpO2 97 %     Weight 172 lb (78 kg)     Height 5\' 4"  (1.626 m)     Head Circumference      Peak Flow      Pain Score 8     Pain Loc      Pain Edu?      Excl. in [place]?    Constitutional: Alert and oriented. Well appearing and in no distress. Eyes: Conjunctivae are normal. Normal extraocular movements. ENT   Head: Normocephalic and atraumatic.   Nose: No congestion/rhinnorhea.   Mouth/Throat: Mucous membranes are moist.   Neck: No stridor. Cardiovascular: Normal rate, regular rhythm. No murmurs, rubs, or gallops. Respiratory: Normal respiratory effort without tachypnea nor retractions. Breath sounds are clear and equal bilaterally. No wheezes/rales/rhonchi.  Gastrointestinal: Soft and nontender. Normal bowel sounds Musculoskeletal: Nontender with normal range of motion in extremities. No lower extremity tenderness nor edema. Neurologic:  Normal speech and language. No gross focal neurologic deficits are appreciated.  Skin:  Skin is warm, dry and intact. No rash noted. Psychiatric: Mood and affect are normal. Speech and behavior are normal.  ____________________________________________  EKG: Interpreted by me.  Sinus bradycardia with a rate of 55 bpm, left axis deviation, left bundle branch block  ____________________________________________  ED COURSE:  As  part of my medical decision making, I reviewed the following data within the [place] History obtained from family if available, nursing notes, old chart and ekg, as well as notes from prior ED visits. Patient presented for generalized symptoms, we will assess with labs and imaging as indicated at this time.   Procedures ____________________________________________   LABS (pertinent positives/negatives)  Labs Reviewed  COMPREHENSIVE METABOLIC PANEL - Abnormal; Notable for the following components:      Result Value   Potassium 3.2 (*)    Glucose, Bld 118 (*)    BUN 21 (*)    GFR calc non Af Amer 60 (*)    All other components within normal limits  CBC - Abnormal; Notable for the following components:   Hemoglobin 11.7 (*)    MCV 77.1 (*)    MCH 24.5 (*)    MCHC 31.8 (*)    RDW 16.0 (*)    All other components within normal limits  URINALYSIS, COMPLETE (UACMP) WITH MICROSCOPIC - Abnormal; Notable for the following components:   Color, Urine YELLOW (*)    APPearance HAZY (*)    Squamous Epithelial / LPF 0-5 (*)    All other components within normal limits  TROPONIN I  INFLUENZA PANEL BY PCR (TYPE A & B)    RADIOLOGY Images were viewed by me  Chest x-ray is normal/abdomen 2 view IMPRESSION: 1. No evidence of complication following recent colonoscopy. Specifically, no pneumoperitoneum. 2. Moderate colonic stool burden without evidence of enteric obstruction. ____________________________________________  DIFFERENTIAL DIAGNOSIS   Influenza, occult infection, UTI, pyelonephritis, pneumonia, dehydration, electrolyte abnormality, viral illness  FINAL ASSESSMENT AND PLAN  Viral illness, constipation   Plan: The patient had presented for multiple symptoms including flulike symptoms and abdominal pain. Patient's labs did not reveal any acute process. Patient's imaging did reveal significant constipation.  I will advised that she drink a whole bottle of  MiraLAX at home.  Otherwise over-the-counter medications for likely viral illness.  She is stable for outpatient follow-up.   Laurence Aly, MD   Note: This note was generated in part or whole with voice recognition software. Voice recognition is usually quite accurate but there are transcription errors that can and very often do occur. I apologize for any typographical errors that were not detected and corrected.     Earleen Newport, MD 05/31/17 [phone]

## 2017-05-31 NOTE — Telephone Encounter (Signed)
Pt needs refill on thyroid medication sent to [facility] in [place]

## 2017-05-31 NOTE — ED Triage Notes (Addendum)
Pt states she had a colonoscopy on Friday.. Pt states awoke yesterday morning with lower and mid abd pain, upper back pain, nausea and chills. Pt denies diarrhea. Pt with normal color warm and dry skin.

## 2017-06-03 ENCOUNTER — Encounter: Payer: Self-pay | Admitting: Gastroenterology

## 2017-06-03 DIAGNOSIS — S52509A Unspecified fracture of the lower end of unspecified radius, initial encounter for closed fracture: Secondary | ICD-10-CM | POA: Insufficient documentation

## 2017-06-10 ENCOUNTER — Ambulatory Visit: Payer: Self-pay | Admitting: Nurse Practitioner

## 2017-06-11 ENCOUNTER — Ambulatory Visit (INDEPENDENT_AMBULATORY_CARE_PROVIDER_SITE_OTHER): Payer: Medicare Other | Admitting: Nurse Practitioner

## 2017-06-11 ENCOUNTER — Encounter: Payer: Self-pay | Admitting: Nurse Practitioner

## 2017-06-11 ENCOUNTER — Other Ambulatory Visit: Payer: Self-pay

## 2017-06-11 VITALS — BP 119/58 | HR 58 | Temp 97.8°F | Ht 64.0 in | Wt 171.0 lb

## 2017-06-11 DIAGNOSIS — I10 Essential (primary) hypertension: Secondary | ICD-10-CM | POA: Diagnosis not present

## 2017-06-11 DIAGNOSIS — E1169 Type 2 diabetes mellitus with other specified complication: Secondary | ICD-10-CM | POA: Diagnosis not present

## 2017-06-11 DIAGNOSIS — R221 Localized swelling, mass and lump, neck: Secondary | ICD-10-CM | POA: Diagnosis not present

## 2017-06-11 LAB — POCT GLYCOSYLATED HEMOGLOBIN (HGB A1C): Hemoglobin A1C: 7.1

## 2017-06-11 MED ORDER — HYDROCHLOROTHIAZIDE 12.5 MG PO TABS: 12.5000 mg | ORAL_TABLET | Freq: Every day | ORAL | 3 refills | Status: DC

## 2017-06-11 MED ORDER — METFORMIN HCL 500 MG PO TABS: 500.0000 mg | ORAL_TABLET | Freq: Two times a day (BID) | ORAL | 3 refills | Status: DC

## 2017-06-11 NOTE — Patient Instructions (Addendum)
Helen Mccormick, Thank you for coming in to clinic today.  1. You now have Type 2 diabetes. - Increase your physical activity until you are increasing your heart rate for 30 minutes on most days of the week. - Eat a low glycemic diet (Chart handout). - Work toward 5-10 weight loss in the next 3 months. - START metformin 500 mg tablet.  Take 1 once daily in the morning for 2 weeks.  Then, take 1 tablet twice daily in am and with dinner and continue.  2. For blood pressure - STOP bisoprolol. - START hydrochlorothiazide 12.5 mg instead.  Take one tablet once daily. - CONTINUE amlodipine 10 mg once daily.   Your provider would like to you have your annual eye exam. Please contact your current eye doctor or here are some good options for you to contact.   [facility]   Address: [address] [place], [place] [address] Phone: [phone]  Website: [email or web address] [address] [place], [place] [address] Phone: [phone] [email or web address]  [facility]  Address: [place], [place], [place] [address] Phone: [phone]   [facility] [address] [place], [place] [address] Phone: [phone]  [facility] Address: [place], [place], [place] [address]  Phone: [phone]  Please schedule a follow-up appointment with Cassell Smiles, AGNP. Return in about 2 weeks (around 06/25/2017) for BP check with CMA.  If you have any other questions or concerns, please feel free to call the clinic or send a message through [place]. You may also schedule an earlier appointment if necessary.  You will receive a survey after today's visit either digitally by e-mail or paper by [employer]. Your experiences and feedback matter to [name].  Please respond so we know how we are doing as we provide care for you.   Cassell Smiles, DNP, AGNP-BC Adult Gerontology Nurse Practitioner [facility]   Diabetes Mellitus and  Standards of Medical Care Managing diabetes (diabetes mellitus) can be complicated. Your diabetes treatment may be managed by a team of health care providers, including:  A diet and nutrition specialist (registered dietitian).  A nurse.  A certified diabetes educator (CDE).  A diabetes specialist (endocrinologist).  An eye doctor.  A primary care provider.  A dentist.  Your health care providers follow a schedule in order to help you get the best quality of care. The following schedule is a general guideline for your diabetes management plan. Your health care providers may also give you more specific instructions. HbA1c ( hemoglobin A1c) test This test provides information about blood sugar (glucose) control over the previous 2-3 months. It is used to check whether your diabetes management plan needs to be adjusted.  If you are meeting your treatment goals, this test is done at least 2 times a year.  If you are not meeting treatment goals or if your treatment goals have changed, this test is done 4 times a year.  Blood pressure test  This test is done at every routine medical visit. For most people, the goal is less than 130/80. Ask your health care provider what your goal blood pressure should be. Dental and eye exams  Visit your dentist two times a year.  If you have type 1 diabetes, get an eye exam 3-5 years after you are diagnosed, and then once a year after your first exam. ? If you were diagnosed with type 1 diabetes  as a child, get an eye exam when you are age 59 or older and have had diabetes for 3-5 years. After the first exam, you should get an eye exam once a year.  If you have type 2 diabetes, have an eye exam as soon as you are diagnosed, and then once a year after your first exam. Foot care exam  Visual foot exams are done at every routine medical visit. The exams check for cuts, bruises, redness, blisters, sores, or other problems with the feet.  A complete  foot exam is done by your health care provider once a year. This exam includes an inspection of the structure and skin of your feet, and a check of the pulses and sensation in your feet. ? Type 1 diabetes: Get your first exam 3-5 years after diagnosis. ? Type 2 diabetes: Get your first exam as soon as you are diagnosed.  Check your feet every day for cuts, bruises, redness, blisters, or sores. If you have any of these or other problems that are not healing, contact your health care provider. Kidney function test ( urine microalbumin)  This test is done once a year. ? Type 1 diabetes: Get your first test 5 years after diagnosis. ? Type 2 diabetes: Get your first test as soon as you are diagnosed.  If you have chronic kidney disease (CKD), get a serum creatinine and estimated glomerular filtration rate (eGFR) test once a year. Lipid profile (cholesterol, HDL, LDL, triglycerides)  This test should be done when you are diagnosed with diabetes, and every 5 years after the first test. If you are on medicines to lower your cholesterol, you may need to get this test done every year. ? The goal for LDL is less than 100 mg/dL (5.5 mmol/L). If you are at high risk, the goal is less than 70 mg/dL (3.9 mmol/L). ? The goal for HDL is 40 mg/dL (2.2 mmol/L) for men and 50 mg/dL(2.8 mmol/L) for women. An HDL cholesterol of 60 mg/dL (3.3 mmol/L) or higher gives some protection against heart disease. ? The goal for triglycerides is less than 150 mg/dL (8.3 mmol/L). Immunizations  The yearly flu (influenza) vaccine is recommended for everyone 6 months or older who has diabetes.  The pneumonia (pneumococcal) vaccine is recommended for everyone 2 years or older who has diabetes. If you are 69 or older, you may get the pneumonia vaccine as a series of two separate shots.  The hepatitis B vaccine is recommended for adults shortly after they have been diagnosed with diabetes.  The Tdap (tetanus, diphtheria, and  pertussis) vaccine should be given: ? According to normal childhood vaccination schedules, for children. ? Every 10 years, for adults who have diabetes.  The shingles vaccine is recommended for people who have had chicken pox and are 50 years or older. Mental and emotional health  Screening for symptoms of eating disorders, anxiety, and depression is recommended at the time of diagnosis and afterward as needed. If your screening shows that you have symptoms (you have a positive screening result), you may need further evaluation and be referred to a mental health care provider. Diabetes self-management education  Education about how to manage your diabetes is recommended at diagnosis and ongoing as needed. Treatment plan  Your treatment plan will be reviewed at every medical visit. Summary  Managing diabetes (diabetes mellitus) can be complicated. Your diabetes treatment may be managed by a team of health care providers.  Your health care providers follow a schedule  in order to help you get the best quality of care.  Standards of care including having regular physical exams, blood tests, blood pressure monitoring, immunizations, screening tests, and education about how to manage your diabetes.  Your health care providers may also give you more specific instructions based on your individual health. This information is not intended to replace advice given to you by your health care provider. Make sure you discuss any questions you have with your health care provider. Document Released: 01/11/2009 Document Revised: 12/13/2015 Document Reviewed: 12/13/2015 Elsevier Interactive Patient Education  [employer].

## 2017-06-11 NOTE — Progress Notes (Signed)
Subjective:    Patient ID: Helen Mccormick, female    DOB: 09/08/1940, 77 y.o.   MRN: [id number]  Helen Mccormick is a 77 y.o. female presenting on 06/11/2017 for Diabetes   HPI Pre-Diabetes Pt presents today for follow up of Type 2 diabetes mellitus. She is checking fasting am CBG at home with a range of 140s sometimes higher.  Some are non-fasting. - Current diabetic medications include: none - She is symptomatic with fatigue.  - She denies polydipsia, polyphagia, polyuria, headaches, diaphoresis, shakiness, chills, pain, numbness or tingling in extremities and changes in vision.   - Clinical course has been worsening. - She  reports no regular exercise routine. - Her diet is moderate in salt, moderate in fat, and moderate in carbohydrates. - Weight trend: stable  PREVENTION: Eye exam current (within one year): unknown Foot exam current (within one year): no  Lipid/ASCVD risk reduction - on statin: yes Kidney protection - on ace or arb: no - HTN managed with other meds to date. Recent Labs    06/11/17 1620  HGBA1C 7.1    Hypertension - She is checking BP at home or outside of clinic.  Readings are inconsistent - Current medications: Ziac and amlodipine 10 mg onc edaily, tolerating with side effects of intermittent dizziness - She is not currently symptomatic. - Pt denies headache, changes in vision, chest tightness/pressure, palpitations, leg swelling, sudden loss of speech or loss of consciousness.  R neck swelling R neck swelling x last 2 weeks, hurts with sleep as well and during day.  Is keeping her from sleeping well.  Has applied a heat pack with improved pain to allow sleep.  States is aching and throbbing. Stable course without worsening.  Social History   Tobacco Use  . Smoking status: Never Smoker  . Smokeless tobacco: Never Used  Substance Use Topics  . Alcohol use: No  . Drug use: No    Review of Systems Per HPI unless specifically indicated  above     Objective:    BP (!) 119/58 (BP Location: Right Arm, Patient Position: Sitting, Cuff Size: Normal)   Pulse (!) 58   Temp 97.8 F (36.6 C) (Oral)   Ht 5\' 4"  (1.626 m)   Wt 171 lb (77.6 kg)   BMI 29.35 kg/m    Wt Readings from Last 3 Encounters:  06/11/17 171 lb (77.6 kg)  05/31/17 172 lb (78 kg)  05/28/17 172 lb (78 kg)    Physical Exam  Constitutional: She is oriented to person, place, and time. She appears well-developed and well-nourished. No distress.  HENT:  Head: Normocephalic and atraumatic.  Neck: Normal range of motion. Neck supple. Carotid bruit is not present.  Cardiovascular: Normal rate, regular rhythm, S1 normal, S2 normal and intact distal pulses.  Murmur (2nd ICS R sternal border) heard.  Crescendo systolic murmur is present with a grade of 2/6. Pulmonary/Chest: Effort normal and breath sounds normal. No respiratory distress.  Musculoskeletal: She exhibits no edema (pedal).  Neurological: She is alert and oriented to person, place, and time.  Skin: Skin is warm and dry.  Psychiatric: She has a normal mood and affect. Her behavior is normal.  Vitals reviewed.  Diabetic Foot Exam - Simple   Simple Foot Form Diabetic Foot exam was performed with the following findings:  Yes 06/11/2017  3:43 PM  Visual Inspection No deformities, no ulcerations, no other skin breakdown bilaterally:  Yes Sensation Testing Intact to touch and monofilament testing bilaterally:  Yes Pulse Check Posterior Tibialis and Dorsalis pulse intact bilaterally:  Yes Comments     Results for orders placed or performed in visit on 06/11/17  POCT glycosylated hemoglobin (Hb A1C)  Result Value Ref Range   Hemoglobin A1C 7.1       Assessment & Plan:   Problem List Items Addressed This Visit      Cardiovascular and Mediastinum   Hypertension Well controlled hypertension.  BP goal < 130/80.  Pt is not working on lifestyle modifications.  Taking medications tolerating well  without side effects. No currently identified complications.  Plan: 1.  Stop bisoprolol.  Start hydrochlorothiazide 12.5 mg once daily.  Continue amlodipine 10 mg once daily 2. Obtain labs CMP, lipid  3. Encouraged heart healthy diet and increasing exercise to 30 minutes most days of the week. 4. Check BP 1-2 x per week at home, keep log, and bring to clinic at next appointment. 5. Follow up 2 weeks for BP check with CMA   Relevant Medications   hydrochlorothiazide (HYDRODIURIL) 12.5 MG tablet   Other Relevant Orders   Comprehensive Metabolic Panel (CMET)   Lipid panel     Endocrine   DM (diabetes mellitus), type 2 ([place]) - Primary UncontrolledDM with A1c 7.1% Worsening control from prior check which indicated only prediabetes and goal A1c < 7.0%. - Complications - Hyperlipidemia.  Plan:  1. Start metformin 500 mg once daily in the morning for 2 weeks.  Then, increase to twice daily and continue. 2. Encourage improved lifestyle: - low carb/low glycemic diet handout provided - Increase physical activity to 30 minutes most days of the week.  Explained that increased physical activity increases body's use of sugar for energy. 3. Check fasting am CBG.  Bring log to next visit for review 4. Continue Statin 5. DM Foot exam done today with Normal findings.   and Advised to schedule DM ophtho exam, send record. 6. Follow-up 3 months   Relevant Orders   POCT glycosylated hemoglobin (Hb A1C) (Completed)   Comprehensive Metabolic Panel (CMET)   Lipid panel    Other Visit Diagnoses    Neck swelling     No appreciable neck swelling noted on exam.  Reassured patient was likely self-limited and has already resolved.  Follow-up only as needed.       No orders of the defined types were placed in this encounter.  Follow up plan: Return in about 2 weeks (around 06/25/2017) for BP check with CMA.  Cassell Smiles, DNP, AGPCNP-BC Adult Gerontology Primary Care Nurse Practitioner [facility] 06/11/2017, 4:24 PM

## 2017-06-14 ENCOUNTER — Telehealth: Payer: Self-pay | Admitting: Student

## 2017-06-14 NOTE — Telephone Encounter (Signed)
She can try taking the hydrochlorothiazide with food if not already.  Please continue.  It can often improve after 1-2 weeks of use.  Pt should have BP check about 2 weeks after starting hydrochlorothiazide.  If improved, we will check her at her next scheduled appointment.

## 2017-06-14 NOTE — Telephone Encounter (Signed)
Pt said the hydrochlorothiazide was upsetting her stomach.  She also needs to know how often she needs to come by to have BP checked [phone]

## 2017-06-15 NOTE — Telephone Encounter (Signed)
Agree with advice provided.  These are not known and common side effects of HCTZ.  Followup as needed.

## 2017-06-15 NOTE — Telephone Encounter (Signed)
Attempted to contact pt, no answer. LMOM to return my call. 

## 2017-06-15 NOTE — Telephone Encounter (Signed)
Attempted to contact the pt again, no answer.

## 2017-06-15 NOTE — Telephone Encounter (Signed)
I spoke w/ pt and she also informed me that she got diarrhea going to the bathroom every hour. I informed her to drink plenty of fluids adding sports drinks, because we worry about patients becoming dehydrated with diarrhea. Also stay away from milk and dairy products (including milk-based protein drinks),fried, fatty, greasy foods. Eat dry foods like toast, crackers, and oatmeal. She said she ate catfish and took the HTZ and then she developed all the symptoms of nausea, diarrhea and stomach discomfort. I recommended per Ander Purpura that the patient continue taking the HTZ w/  food, because these symptoms sound more like a virus or possibly more related to something she ate that upset her stomach. Please advise

## 2017-06-17 ENCOUNTER — Telehealth: Payer: Self-pay | Admitting: Nurse Practitioner

## 2017-06-17 NOTE — Telephone Encounter (Signed)
Pt. Called states that the medication she was on was making her sick  (medicatin name was not given) she was thinking that  Medication was to strong . Pt call back # [phone]

## 2017-06-18 NOTE — Telephone Encounter (Signed)
Patient reports side effect to medications.  Two new medications were started at last visit: Hydrochlorothiazide and metformin.  Patient's side effect is that she feels weak and tired. Home SBP - 147 (Thurs), 127 (Wed) Patient also states that she is now sick on stomach - loose stools and vomiting.  Patient believes hydrochlorothiazide is causing her symptoms.  However, most common side effect of metformin is GI upset.  Discussed this with patient and instructed her to stop taking metformin.  Patient verbalizes understanding.

## 2017-06-21 ENCOUNTER — Other Ambulatory Visit: Payer: Self-pay | Admitting: Internal Medicine

## 2017-06-26 ENCOUNTER — Encounter: Payer: Self-pay | Admitting: Nurse Practitioner

## 2017-06-30 ENCOUNTER — Other Ambulatory Visit: Payer: Self-pay | Admitting: Internal Medicine

## 2017-07-04 ENCOUNTER — Emergency Department: Payer: Medicare Other

## 2017-07-04 ENCOUNTER — Encounter: Payer: Self-pay | Admitting: Medical Oncology

## 2017-07-04 ENCOUNTER — Emergency Department
Admission: EM | Admit: 2017-07-04 | Discharge: 2017-07-04 | Disposition: A | Discharge status: Home / Self Care | Payer: Medicare Other | Attending: Emergency Medicine | Admitting: Emergency Medicine

## 2017-07-04 DIAGNOSIS — E119 Type 2 diabetes mellitus without complications: Secondary | ICD-10-CM | POA: Insufficient documentation

## 2017-07-04 DIAGNOSIS — I10 Essential (primary) hypertension: Secondary | ICD-10-CM | POA: Insufficient documentation

## 2017-07-04 DIAGNOSIS — Z7982 Long term (current) use of aspirin: Secondary | ICD-10-CM | POA: Insufficient documentation

## 2017-07-04 DIAGNOSIS — J45909 Unspecified asthma, uncomplicated: Secondary | ICD-10-CM | POA: Diagnosis not present

## 2017-07-04 DIAGNOSIS — Z79899 Other long term (current) drug therapy: Secondary | ICD-10-CM | POA: Insufficient documentation

## 2017-07-04 DIAGNOSIS — M542 Cervicalgia: Secondary | ICD-10-CM | POA: Diagnosis present

## 2017-07-04 DIAGNOSIS — M5412 Radiculopathy, cervical region: Secondary | ICD-10-CM | POA: Diagnosis not present

## 2017-07-04 LAB — COMPREHENSIVE METABOLIC PANEL
ALT: 20 U/L (ref 14–54)
AST: 25 U/L (ref 15–41)
Albumin: 3.8 g/dL (ref 3.5–5.0)
Alkaline Phosphatase: 75 U/L (ref 38–126)
Anion gap: 5 (ref 5–15)
BUN: 22 mg/dL — ABNORMAL HIGH (ref 6–20)
CO2: 30 mmol/L (ref 22–32)
Calcium: 8.9 mg/dL (ref 8.9–10.3)
Chloride: 103 mmol/L (ref 101–111)
Creatinine, Ser: 0.89 mg/dL (ref 0.44–1.00)
GFR calc Af Amer: >60 mL/min (ref >60)
GFR calc non Af Amer: >60 mL/min (ref >60)
Glucose, Bld: 107 mg/dL — ABNORMAL HIGH (ref 65–99)
Potassium: 3.6 mmol/L (ref 3.5–5.1)
Sodium: 138 mmol/L (ref 135–145)
Total Bilirubin: 0.8 mg/dL (ref 0.3–1.2)
Total Protein: 7 g/dL (ref 6.5–8.1)

## 2017-07-04 LAB — CBC
HCT: 35.8 % (ref 35.0–47.0)
Hemoglobin: 11.4 g/dL — ABNORMAL LOW (ref 12.0–16.0)
MCH: 24.4 pg — ABNORMAL LOW (ref 26.0–34.0)
MCHC: 31.9 g/dL — ABNORMAL LOW (ref 32.0–36.0)
MCV: 76.5 fL — ABNORMAL LOW (ref 80.0–100.0)
Platelets: 223 10*3/uL (ref 150–440)
RBC: 4.68 MIL/uL (ref 3.80–5.20)
RDW: 15.6 % — ABNORMAL HIGH (ref 11.5–14.5)
WBC: 5.3 10*3/uL (ref 3.6–11.0)

## 2017-07-04 LAB — TROPONIN I: Troponin I: <0.03 ng/mL (ref <0.03)

## 2017-07-04 MED ORDER — GABAPENTIN 100 MG PO CAPS: 100.0000 mg | ORAL_CAPSULE | Freq: Three times a day (TID) | ORAL | 0 refills | Status: DC | PRN

## 2017-07-04 MED ORDER — ACETAMINOPHEN 500 MG PO TABS
1000.0000 mg | ORAL_TABLET | ORAL | Status: AC
  Administered 2017-07-04: 1000 mg via ORAL
  Filled 2017-07-04: qty 2

## 2017-07-04 MED ORDER — NAPROXEN 500 MG PO TABS
250.0000 mg | ORAL_TABLET | Freq: Once | ORAL | Status: AC
  Administered 2017-07-04: 250 mg via ORAL
  Filled 2017-07-04: qty 1

## 2017-07-04 NOTE — ED Notes (Signed)
AAOx3.  Skin warm and dry.  NAD 

## 2017-07-04 NOTE — ED Triage Notes (Signed)
Pt reports rt sided neck and arm pain that began about a week ago. Pt denies injury. States that pain worsens with movement. Denies sob.

## 2017-07-04 NOTE — ED Provider Notes (Signed)
[facility] Emergency Department Provider Note   ____________________________________________   First MD Initiated Contact with Patient 07/04/17 [phone]     (approximate)  I have reviewed the triage vital signs and the nursing notes.   HISTORY  Chief Complaint Arm Pain    HPI Helen Mccormick is a 77 y.o. female been experiencing pain in the right side of her neck that radiates down her right arm for about 2-3 weeks  Patient reports no injury.  Reports started getting a feeling like a discomfort in the right lower side of her neck and it is been radiating pain down the back of her right arm towards her fingers with occasional feeling of a burning sensation in the lower right neck, a sharp stinging sensation over the backside of the right arm that goes into the hands off and on.  Some occasional tingling in the fingertips on the right hand off and on for the last 2-3 weeks.  No weakness.  No headache.  No nausea or vomiting.  No trouble speaking or weakness in her legs or left hand.  Reports a previous neck surgery at the 3 or 4 level.  She is to try to leave a couple days ago which did provide some relief but she has not taken any today.  She saw her primary care doctor who noticed something related to her neck she also reports about 2-3 weeks ago it seemed just a little bit swollen over the right upper neck.  Past Medical History:  Diagnosis Date  . Allergy   . Anxiety   . Asthma   . Colon polyp   . Depression   . Diabetes mellitus without complication ([place])   . GERD (gastroesophageal reflux disease)   . Heart murmur   . Hyperlipidemia   . Hypertension   . Sleep apnea   . Thyroid disease     Patient Active Problem List   Diagnosis Date Noted  . Fracture of distal end of radius 06/03/2017  . Hypertension 03/24/2017  . DM (diabetes mellitus), type 2 ([place]) 03/24/2017  . 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 Hyperlipidemia due to type 2  diabetes mellitus ([place]) 03/24/2017  . Cervical radiculitis 12/14/2013  . DDD (degenerative disc disease), cervical 12/14/2013  . Left shoulder pain 12/14/2013    Past Surgical History:  Procedure Laterality Date  . ABDOMINAL HYSTERECTOMY    . BACK SURGERY    . BACK SURGERY    . CERVICAL FUSION    . COLONOSCOPY WITH PROPOFOL N/A 05/28/2017   Procedure: COLONOSCOPY WITH PROPOFOL;  Surgeon: Jonathon Bellows, MD;  Location: [facility] ENDOSCOPY;  Service: Gastroenterology;  Laterality: N/A;  . NECK SURGERY    . THYROID SURGERY    . THYROID SURGERY      Prior to Admission medications   Medication Sig Start Date End Date Taking? Authorizing Provider  albuterol (PROVENTIL HFA;VENTOLIN HFA) 108 (90 Base) MCG/ACT inhaler Inhale into the lungs every 6 (six) hours as needed for wheezing or shortness of breath.    [provider]  amLODipine (NORVASC) 10 MG tablet TAKE 1 TABLET BY MOUTH EVERY DAY 06/21/17   Lavera Guise, MD  aspirin 81 MG chewable tablet Chew 81 mg by mouth daily.     [provider]  atorvastatin (LIPITOR) 20 MG tablet Take 40 mg by mouth at bedtime.     [provider]  cholecalciferol (VITAMIN D) 1000 units tablet Take 1 tablet by mouth daily.  [provider]  escitalopram (LEXAPRO) 10 MG tablet TAKE 1 TABLET BY MOUTH EVERY DAY FOR DEPRESSION 05/11/17   [name], NP  ferrous sulfate 325 (65 FE) MG tablet Take 325 mg by mouth daily with breakfast.    [provider]  fluticasone (FLONASE) 50 MCG/ACT nasal spray Place 2 sprays into both nostrils daily.    [provider]  gabapentin (NEURONTIN) 100 MG capsule Take 1 capsule (100 mg total) by mouth 3 (three) times daily as needed. 07/04/17 07/04/18  Delman Kitten, MD  glucose blood test strip Use 1 strip to check your blood sugar two times daily. 03/12/17   [name], NP  hydrochlorothiazide (HYDRODIURIL) 12.5 MG tablet Take 1 tablet (12.5 mg total) by mouth daily.  06/11/17   [name], NP  levothyroxine (SYNTHROID, LEVOTHROID) 75 MCG tablet Take 1 tablet (75 mcg total) by mouth daily before breakfast. 05/31/17   [name], NP  lubiprostone (AMITIZA) 8 MCG capsule Take 8 mcg by mouth 2 (two) times daily with a meal.    [provider]  naproxen (NAPROSYN) 500 MG tablet Take 1 tablet (500 mg total) by mouth 2 (two) times daily with a meal. 10/20/15   Hagler, Jami L, PA-C  Omega-3 1000 MG CAPS Take 1 capsule by mouth daily.     [provider]  tiZANidine (ZANAFLEX) 2 MG tablet Take 2 mg by mouth 2 (two) times daily as needed.  04/19/17   [provider]    Allergies Etodolac and Tramadol  Family History  Problem Relation Age of Onset  . Breast cancer Maternal Aunt 60  . Diabetes Mother   . Leukemia Mother   . Diabetes Sister   . Mental illness Sister   . Colon polyps Sister   . Stroke Father   . Cancer Brother        rectal  . Healthy Daughter   . Hypertension Son   . Diabetes Sister   . Thyroid disease Sister   . Healthy Sister   . Healthy Sister   . Stroke Brother   . Healthy Son   . Healthy Son   . Healthy Daughter     Social History Social History   Tobacco Use  . Smoking status: Never Smoker  . Smokeless tobacco: Never Used  Substance Use Topics  . Alcohol use: No  . Drug use: No    Review of Systems Constitutional: No fever/chills Eyes: No visual changes. ENT: No sore throat. Cardiovascular: Denies chest pain. Respiratory: Denies shortness of breath. Gastrointestinal: No abdominal pain.  No nausea, no vomiting.  No diarrhea.  No constipation. Genitourinary: Negative for dysuria. Musculoskeletal: Negative for back pain.  See HPI Skin: Negative for rash. Neurological: Negative for headaches no weakness.    ____________________________________________   PHYSICAL EXAM:  VITAL SIGNS: ED Triage Vitals  Enc Vitals Group     BP 07/04/17 0810 (!) 128/58     Pulse Rate  07/04/17 0810 66     Resp 07/04/17 0810 18     Temp 07/04/17 0810 98 F (36.7 C)     Temp Source 07/04/17 0810 Oral     SpO2 07/04/17 0810 99 %     Weight 07/04/17 0811 146 lb (66.2 kg)     Height 07/04/17 0811 5\' 4"  (1.626 m)     Head Circumference --      Peak Flow --      Pain Score 07/04/17 0811 10  Pain Loc --      Pain Edu? --      Excl. in [place]? --     Constitutional: Alert and oriented. Well appearing and in no acute distress. Eyes: Conjunctivae are normal. Head: Atraumatic. Nose: No congestion/rhinnorhea. Mouth/Throat: Mucous membranes are moist. Neck: No stridor.  No midline cervical tenderness.  The patient does report some discomfort to palpation along the right paraspinous region of the lower cervical spine. Cardiovascular: Normal rate, regular rhythm. Grossly normal heart sounds.  Good peripheral circulation. Respiratory: Normal respiratory effort.  No retractions. Lungs CTAB. Gastrointestinal: Soft and nontender. No distention. Musculoskeletal: No lower extremity tenderness nor edema.  RIGHT Right upper extremity demonstrates normal strength, good use of all muscles. No edema bruising or contusions of the right shoulder/upper arm, right elbow, right forearm / hand. Full range of motion of the right right upper extremity without pain. No evidence of trauma. Strong radial pulse. Intact median/ulnar/radial neuro-muscular exam.  LEFT Left upper extremity demonstrates normal strength, good use of all muscles. No edema bruising or contusions of the left shoulder/upper arm, left elbow, left forearm / hand. Full range of motion of the left  upper extremity without pain. No evidence of trauma. Strong radial pulse. Intact median/ulnar/radial neuro-muscular exam.   Neurologic:    The patient has no pronator drift. The patient has normal cranial nerve exam. Extraocular movements are normal. Patient has 5 out of 5 strength in all extremities. There is no numbness or gross,  acute sensory abnormality in the extremities bilaterally. No speech disturbance. No dysarthria. No aphasia. No ataxia. Normal finger nose finger bilat. Patient speaking in full and clear sentences.   Skin:  Skin is warm, dry and intact. No rash noted. Psychiatric: Mood and affect are normal. Speech and behavior are normal.  ____________________________________________   LABS (all labs ordered are listed, but only abnormal results are displayed)  Labs Reviewed  CBC - Abnormal; Notable for the following components:      Result Value   Hemoglobin 11.4 (*)    MCV 76.5 (*)    MCH 24.4 (*)    MCHC 31.9 (*)    RDW 15.6 (*)    All other components within normal limits  COMPREHENSIVE METABOLIC PANEL - Abnormal; Notable for the following components:   Glucose, Bld 107 (*)    BUN 22 (*)    All other components within normal limits  TROPONIN I   ____________________________________________  EKG  Reviewed her by me at 8:10 AM Heart rate 79 cures 150 QTC 470 Left bundle branch block, no evidence of acute ischemia ____________________________________________  RADIOLOGY  Cervical spine x-rays reviewed, stable without acute change. ____________________________________________   PROCEDURES  Procedure(s) performed: None  Procedures  Critical Care performed: No  ____________________________________________   INITIAL IMPRESSION / ASSESSMENT AND PLAN / ED COURSE  Pertinent labs & imaging results that were available during my care of the patient were reviewed by me and considered in my medical decision making (see chart for details).  Patient presents for right-sided neck pain radiate down the right arm for about 2-3 weeks.  Symptoms that sound very radicular in nature with some neuropathic symptoms.  Denies associated chest pain or pulmonary symptoms.  Reassuring vital signs without evidence of acute or focal neurologic deficit on careful exam.  Strong radial pulse.  Good  use of the right upper extremity.  We will check labs including troponin to evaluate for cardiac or other cause though I most suspect this is likely musculoskeletal  or radicular to a possible pinched disc, or potentially small herniated disc.  ----------------------------------------- 9:57 AM on 07/04/2017 -----------------------------------------  Lab work, x-rays reassuring.  Patient resting comfortably reports some relief after relief.  No deficits.  Discussed with patient she will follow-up with her doctor at [place] if worsening to return to the emergency room and discussed careful return precautions with the patient was in agreement.  Return precautions and treatment recommendations and follow-up discussed with the patient who is agreeable with the plan.  Also recommend that she not drive until she knows that gabapentin does not cause her any drowsiness.  Patient agreeable.      ____________________________________________   FINAL CLINICAL IMPRESSION(S) / ED DIAGNOSES  Final diagnoses:  Cervical radicular pain      NEW MEDICATIONS STARTED DURING THIS VISIT:  New Prescriptions   GABAPENTIN (NEURONTIN) 100 MG CAPSULE    Take 1 capsule (100 mg total) by mouth 3 (three) times daily as needed.     Note:  This document was prepared using Dragon voice recognition software and may include unintentional dictation errors.     Delman Kitten, MD 07/04/17 [phone]

## 2017-07-06 ENCOUNTER — Encounter: Payer: Self-pay | Admitting: Nurse Practitioner

## 2017-07-06 ENCOUNTER — Ambulatory Visit (INDEPENDENT_AMBULATORY_CARE_PROVIDER_SITE_OTHER): Payer: Medicare Other | Admitting: Nurse Practitioner

## 2017-07-06 ENCOUNTER — Other Ambulatory Visit: Payer: Self-pay

## 2017-07-06 VITALS — BP 122/59 | HR 80 | Ht 64.0 in | Wt 167.2 lb

## 2017-07-06 DIAGNOSIS — E785 Hyperlipidemia, unspecified: Secondary | ICD-10-CM | POA: Diagnosis not present

## 2017-07-06 DIAGNOSIS — I10 Essential (primary) hypertension: Secondary | ICD-10-CM | POA: Diagnosis not present

## 2017-07-06 DIAGNOSIS — M25511 Pain in right shoulder: Secondary | ICD-10-CM

## 2017-07-06 DIAGNOSIS — E1169 Type 2 diabetes mellitus with other specified complication: Secondary | ICD-10-CM | POA: Diagnosis not present

## 2017-07-06 DIAGNOSIS — K219 Gastro-esophageal reflux disease without esophagitis: Secondary | ICD-10-CM | POA: Diagnosis not present

## 2017-07-06 MED ORDER — ATORVASTATIN CALCIUM 20 MG PO TABS: 40.0000 mg | ORAL_TABLET | Freq: Every day | ORAL | 1 refills | Status: DC

## 2017-07-06 MED ORDER — PREDNISONE 10 MG PO TABS: ORAL_TABLET | ORAL | 0 refills | Status: DC

## 2017-07-06 MED ORDER — OMEPRAZOLE 40 MG PO CPDR: 40.0000 mg | DELAYED_RELEASE_CAPSULE | Freq: Every day | ORAL | 1 refills | Status: DC

## 2017-07-06 MED ORDER — HYDROCHLOROTHIAZIDE 12.5 MG PO TABS: 12.5000 mg | ORAL_TABLET | Freq: Every day | ORAL | 3 refills | Status: DC

## 2017-07-06 NOTE — Progress Notes (Signed)
Subjective:    Patient ID: Helen Mccormick, female    DOB: Sep 16, 1940, 77 y.o.   MRN: [id number]  Helen Mccormick is a 77 y.o. female presenting on 07/06/2017 for Cervical radicular pain (pt was seen in the ER 07/04/17)   HPI  ED Followup Visit Hospital/Location: [facility] Date of ED Visit: 07/04/2017 Reason for Presenting to ED: Cervical radicular pain  FOLLOW-UP  - ED provider note and record have been reviewed - Patient presents today about 2 days after recent ED visit. Brief summary of recent course, patient had symptoms of R sided neck pain with radiation down R arm for 2-3 weeks, presented to ED on 07/04/2017, testing in ED included CMP, Troponin (Neg), R shoulder xray, treated with gabapentin 100 mg tid. - Today reports overall has done the same since discharge.  Pain has changed with more pain in shoulder and upper arm.  She also notes swelling of R upper arm that improves with compression wrapping. - New medications on discharge: gabapentin - Changes to current meds on discharge: none  Pt reports prior R shoulder/neck physical therapy at least 12 months ago.  She notes physical therapy has worsened pain.  Pt reports having a TENS unit applied for too long one day that started some of her nerve pain in her arm.    Refill requests: Pt requests refills on atorvastatin, omeprazole, and hydrochlorothiazide today.  Pt reports no side effects from these medications.  She has had no breakthrough heartburn when taking omeprazole daily.  Social History   Tobacco Use  . Smoking status: Never Smoker  . Smokeless tobacco: Never Used  Substance Use Topics  . Alcohol use: No  . Drug use: No    Review of Systems Per HPI unless specifically indicated above  Outpatient Encounter Medications as of 07/06/2017  Medication Sig  . albuterol (PROVENTIL HFA;VENTOLIN HFA) 108 (90 Base) MCG/ACT inhaler Inhale into the lungs every 6 (six) hours as needed for wheezing or shortness of breath.  Marland Kitchen  amLODipine (NORVASC) 10 MG tablet TAKE 1 TABLET BY MOUTH EVERY DAY  . aspirin 81 MG chewable tablet Chew 81 mg by mouth daily.   Marland Kitchen atorvastatin (LIPITOR) 20 MG tablet Take 2 tablets (40 mg total) by mouth at bedtime.  . cholecalciferol (VITAMIN D) 1000 units tablet Take 1 tablet by mouth daily.   Marland Kitchen escitalopram (LEXAPRO) 10 MG tablet TAKE 1 TABLET BY MOUTH EVERY DAY FOR DEPRESSION  . ferrous sulfate 325 (65 FE) MG tablet Take 325 mg by mouth daily with breakfast.  . fluticasone (FLONASE) 50 MCG/ACT nasal spray Place 2 sprays into both nostrils daily.  Marland Kitchen gabapentin (NEURONTIN) 100 MG capsule Take 1 capsule (100 mg total) by mouth 3 (three) times daily as needed.  Marland Kitchen glucose blood test strip Use 1 strip to check your blood sugar two times daily.  Marland Kitchen levothyroxine (SYNTHROID, LEVOTHROID) 75 MCG tablet Take 1 tablet (75 mcg total) by mouth daily before breakfast.  . lubiprostone (AMITIZA) 8 MCG capsule Take 8 mcg by mouth 2 (two) times daily with a meal.  . naproxen (NAPROSYN) 500 MG tablet Take 1 tablet (500 mg total) by mouth 2 (two) times daily with a meal.  . Omega-3 1000 MG CAPS Take 1 capsule by mouth daily.   Marland Kitchen omeprazole (PRILOSEC) 40 MG capsule Take 1 capsule (40 mg total) by mouth daily.  . [DISCONTINUED] atorvastatin (LIPITOR) 20 MG tablet Take 40 mg by mouth at bedtime.   . [DISCONTINUED] omeprazole (PRILOSEC) 40  MG capsule Take 40 mg by mouth daily.  . [DISCONTINUED] tiZANidine (ZANAFLEX) 2 MG tablet Take 2 mg by mouth 2 (two) times daily as needed.   . hydrochlorothiazide (HYDRODIURIL) 12.5 MG tablet Take 1 tablet (12.5 mg total) by mouth daily.  . predniSONE (DELTASONE) 10 MG tablet Day 1-2 take 6 pills. Day 3 take 5 pills then reduce by 1 pill each day.  . [DISCONTINUED] hydrochlorothiazide (HYDRODIURIL) 12.5 MG tablet Take 1 tablet (12.5 mg total) by mouth daily. (Patient not taking: Reported on 07/06/2017)   No facility-administered encounter medications on file as of 07/06/2017.         Objective:    BP (!) 122/59 (BP Location: Left Arm, Patient Position: Sitting, Cuff Size: Normal)   Pulse 80   Ht 5\' 4"  (1.626 m)   Wt 167 lb 3.2 oz (75.8 kg)   BMI 28.70 kg/m   Wt Readings from Last 3 Encounters:  07/06/17 167 lb 3.2 oz (75.8 kg)  07/04/17 146 lb (66.2 kg)  06/11/17 171 lb (77.6 kg)    Physical Exam  Constitutional: She is oriented to person, place, and time. She appears well-developed and well-nourished. No distress.  HENT:  Head: Normocephalic and atraumatic.  Neck: Normal range of motion. Neck supple. No spinous process tenderness and no muscular tenderness present. No neck rigidity. No edema and normal range of motion present.  Musculoskeletal:  Right Shoulder Inspection: Normal appearance bilateral symmetrical Palpation: Mildly tender to palpation over lateral shoulder.  Non-tender anterior or posterior shoulder.  ROM: Reduced active ROM forward flexion, abduction, internal / external rotation with pain at lateral shoulder in all directions.  Pt only able to abduct arm to 85 degrees.  PROM with normal ROM for abduction. Special Testing: Rotator cuff testing positive for weakness with supraspinatus full can and empty can test, O'brien's negative for labral pain, Hawkin's AC impingement negative for pain Strength: Normal strength 5/5 flex/ext, ext rot / int rot, grip, rotator cuff str testing. Neurovascular: Distally intact pulses, sensation to light touch   Left Shoulder Inspection: Normal appearance bilateral symmetrical Palpation: Non-tender to palpation over anterior, lateral, or posterior shoulder  ROM: Full intact active ROM forward flexion, abduction, internal / external rotation, symmetrical Special Testing: Rotator cuff testing negative for weakness with supraspinatus full can and empty can test, O'brien's negative for labral pain, Hawkin's AC impingement negative for pain Strength: Normal strength 5/5 flex/ext, ext rot / int rot, grip, rotator cuff  str testing. Neurovascular: Distally intact pulses, sensation to light touch   Neurological: She is alert and oriented to person, place, and time.  Skin: Skin is warm and dry.  Psychiatric: She has a normal mood and affect. Her behavior is normal. Judgment and thought content normal.  Vitals reviewed.    Results for orders placed or performed during the hospital encounter of 07/04/17  Troponin I  Result Value Ref Range   Troponin I <0.03 <0.03 ng/mL  CBC  Result Value Ref Range   WBC 5.3 3.6 - 11.0 K/uL   RBC 4.68 3.80 - 5.20 MIL/uL   Hemoglobin 11.4 (L) 12.0 - 16.0 g/dL   HCT 35.8 35.0 - 47.0 %   MCV 76.5 (L) 80.0 - 100.0 fL   MCH 24.4 (L) 26.0 - 34.0 pg   MCHC 31.9 (L) 32.0 - 36.0 g/dL   RDW 15.6 (H) 11.5 - 14.5 %   Platelets 223 150 - 440 K/uL  Comprehensive metabolic panel  Result Value Ref Range   Sodium 138  135 - 145 mmol/L   Potassium 3.6 3.5 - 5.1 mmol/L   Chloride 103 101 - 111 mmol/L   CO2 30 22 - 32 mmol/L   Glucose, Bld 107 (H) 65 - 99 mg/dL   BUN 22 (H) 6 - 20 mg/dL   Creatinine, Ser 0.89 0.44 - 1.00 mg/dL   Calcium 8.9 8.9 - 10.3 mg/dL   Total Protein 7.0 6.5 - 8.1 g/dL   Albumin 3.8 3.5 - 5.0 g/dL   AST 25 15 - 41 U/L   ALT 20 14 - 54 U/L   Alkaline Phosphatase 75 38 - 126 U/L   Total Bilirubin 0.8 0.3 - 1.2 mg/dL   GFR calc non Af Amer >60 >60 mL/min   GFR calc Af Amer >60 >60 mL/min   Anion gap 5 5 - 15      Assessment & Plan:   Problem List Items Addressed This Visit      Cardiovascular and Mediastinum   Hypertension Stable today.  Refill requested and provided.   Relevant Medications   hydrochlorothiazide (HYDRODIURIL) 12.5 MG tablet   atorvastatin (LIPITOR) 20 MG tablet     Endocrine   Hyperlipidemia due to type 2 diabetes mellitus ([place]) Status unknown as no recent lipid panel collected.  Will request at next visit.  Refill provided today.   Relevant Medications   hydrochlorothiazide (HYDRODIURIL) 12.5 MG tablet   atorvastatin (LIPITOR)  20 MG tablet    Other Visit Diagnoses    Acute pain of right shoulder    -  Primary Acute to subacute pain of right shoulder in pt with past C3-C4 surgery.  Pain has not improved since ED visit on 4/7.  Pain now impacting rotator cuff strength more than previously.  No current concern for impingement syndrome, but concern for possible rotator cuff tear.  Plan:  1. Treat with OTC pain meds (acetaminophen).  Discussed alternate dosing and max dosing. - Start prednisone taper over 7 days Day 1-2: 60 mg, Day 3: 50 mg, Day 4: 40 mg; Day 5: 30 mg; Day 6 20 mg; Day 7: 10 mg then stop. 2. Apply heat and/or ice to affected area. 3. May also apply a muscle rub with lidocaine or lidocaine patch after heat or ice. 4. Take gabapentin 100 mg in evening after work.  Take gabapentin 200 mg at bedtime.  Cautioned drowsiness. 5. Referral to orthopedic surgery placed for additional evaluation.  May need MRI. 6. Follow up 2-4 weeks prn.    Relevant Medications   predniSONE (DELTASONE) 10 MG tablet   Other Relevant Orders   Ambulatory referral to Orthopedic Surgery   Gastroesophageal reflux disease, esophagitis presence not specified     Pt with stable GERD.  No breakthrough symptoms are noted when taking omeprazole 40 mg once daily.  Tolerating well without side effects.  Refill requested and provided.   Relevant Medications   omeprazole (PRILOSEC) 40 MG capsule      Meds ordered this encounter  Medications  . hydrochlorothiazide (HYDRODIURIL) 12.5 MG tablet    Sig: Take 1 tablet (12.5 mg total) by mouth daily.    Dispense:  90 tablet    Refill:  3  . atorvastatin (LIPITOR) 20 MG tablet    Sig: Take 2 tablets (40 mg total) by mouth at bedtime.    Dispense:  90 tablet    Refill:  1  . omeprazole (PRILOSEC) 40 MG capsule    Sig: Take 1 capsule (40 mg total) by mouth daily.  Dispense:  90 capsule    Refill:  1  . predniSONE (DELTASONE) 10 MG tablet    Sig: Day 1-2 take 6 pills. Day 3 take 5 pills  then reduce by 1 pill each day.    Dispense:  27 tablet    Refill:  0    Order Specific Question:   Supervising Provider    Answer:   Olin Hauser [[id number]]    I have reviewed the admission H&P, hospital notes, discharge summary, discharge medication list, and have reconciled the current and discharge medications today.  Follow up plan: Return 2-4 weeks if symptoms worsen or fail to improve.  Cassell Smiles, DNP, AGPCNP-BC Adult Gerontology Primary Care Nurse Practitioner [facility] 07/06/2017, 5:08 PM

## 2017-07-06 NOTE — Patient Instructions (Addendum)
Helen Mccormick "Helen Mccormick",   Thank you for coming in to clinic today.  1. You have Right shoulder pain with nerve pain and possible rotator cuff strain or tear.  - Start taking Tylenol extra strength 1 to 2 tablets every 6-8 hours for aches or fever/chills for next few days as needed.  Do not take more than 3,000 mg in 24 hours from all medicines.   - Start prednisone taper over 7 days Day 1-2: 60 mg, Day 3: 50 mg, Day 4: 40 mg; Day 5: 30 mg; Day 6 20 mg; Day 7: 10 mg then stop. - Use heat and ice.  Apply this for 15 minutes at a time 6-8 times per day.   - Muscle rub with lidocaine, lidocaine patch, Biofreeze, or tiger balm for topical pain relief.  Avoid using this with heat and ice to avoid burns.  - Change gabapentin to 100 mg (one tablet) in daytime after work and 200 mg (two tablets) at bedtime.  Referral is being placed to [facility] for your shoulder evaluation.  Please schedule a follow-up appointment with Cassell Smiles, AGNP. Return 2-4 weeks if symptoms worsen or fail to improve.  If you have any other questions or concerns, please feel free to call the clinic or send a message through [place]. You may also schedule an earlier appointment if necessary.  You will receive a survey after today's visit either digitally by e-mail or paper by [employer]. Your experiences and feedback matter to [name].  Please respond so we know how we are doing as we provide care for you.   Cassell Smiles, DNP, AGNP-BC Adult Gerontology Nurse Practitioner [place]

## 2017-07-13 ENCOUNTER — Other Ambulatory Visit: Payer: Self-pay | Admitting: Nurse Practitioner

## 2017-07-13 ENCOUNTER — Telehealth: Payer: Self-pay | Admitting: Nurse Practitioner

## 2017-07-13 NOTE — Telephone Encounter (Signed)
Pt's daughter, Helen Mccormick has called with concerns about her mother.  Helen Mccormick is listed on DPR forms for release of information  Pt continues to complain of right sided facial pain that worsened over last 3-4 days.   Helen Mccormick is concerned about a possible interaction between metformin, prednisone, and gabapentin.  No known interaction for face pain/neuropathy from these medications.  Described nature of prior visit and reminded Helen Mccormick we are waiting on orthopedic referral for possible rotator cuff tear evaluation.  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  Prior workup for MI was negative, but ER did not complete workup for TIA/CVA.  Helen Mccormick describes her mother as forgetful.  None of these symptoms were present according to [facility] before starting metformin and lisinopril.  Pt is continuing to take metformin despite being instructed to stop on 3/22 for diarrhea and GI upset.  Pt may still be taking it and should continue if tolerating for benefit to DM diagnosis.  Helen Mccormick verbalizes understanding.  Pt also not taking gabapentin regularly, and I have requested pt continue taking this for her pain.    Pt should seek care back in ED if symptoms persist or worsen.  Cannot exclude possible CVA with these symptoms given pt is also having memory impairment.  Helen Mccormick verbalizes understanding.

## 2017-07-13 NOTE — Telephone Encounter (Signed)
Pt.  Daughters called states that pt was taking medication it was making dizzy, and painful in the face. Pt call back  # is [phone]

## 2017-07-16 ENCOUNTER — Telehealth: Payer: Self-pay | Admitting: Nurse Practitioner

## 2017-07-16 DIAGNOSIS — M25511 Pain in right shoulder: Secondary | ICD-10-CM

## 2017-07-16 NOTE — Telephone Encounter (Signed)
Pt asked for a referral to ortho for shoulder pain [phone]

## 2017-07-16 NOTE — Telephone Encounter (Signed)
Placed referral.  Please use OV note from 07/06/17

## 2017-07-20 NOTE — Telephone Encounter (Signed)
I left several messages on the patient vm to call me back to schedule her to be evaluated for her shoulder pain at [facility]. [facility] have also attempted to contact the pt to schedule an appt and no response.  I called the patient son Marland Kitchen and left a message with him to have his mother to return my call, but still unsuccessful.

## 2017-08-06 ENCOUNTER — Emergency Department: Payer: Medicare Other

## 2017-08-06 ENCOUNTER — Encounter: Payer: Self-pay | Admitting: Emergency Medicine

## 2017-08-06 ENCOUNTER — Other Ambulatory Visit: Payer: Self-pay

## 2017-08-06 ENCOUNTER — Emergency Department
Admission: EM | Admit: 2017-08-06 | Discharge: 2017-08-06 | Disposition: A | Discharge status: Home / Self Care | Payer: Medicare Other | Attending: Emergency Medicine | Admitting: Emergency Medicine

## 2017-08-06 DIAGNOSIS — E039 Hypothyroidism, unspecified: Secondary | ICD-10-CM | POA: Insufficient documentation

## 2017-08-06 DIAGNOSIS — I712 Thoracic aortic aneurysm, without rupture, unspecified: Secondary | ICD-10-CM

## 2017-08-06 DIAGNOSIS — E119 Type 2 diabetes mellitus without complications: Secondary | ICD-10-CM | POA: Diagnosis not present

## 2017-08-06 DIAGNOSIS — Z7982 Long term (current) use of aspirin: Secondary | ICD-10-CM | POA: Insufficient documentation

## 2017-08-06 DIAGNOSIS — Z79899 Other long term (current) drug therapy: Secondary | ICD-10-CM | POA: Insufficient documentation

## 2017-08-06 DIAGNOSIS — R079 Chest pain, unspecified: Secondary | ICD-10-CM | POA: Diagnosis present

## 2017-08-06 DIAGNOSIS — J45909 Unspecified asthma, uncomplicated: Secondary | ICD-10-CM | POA: Diagnosis not present

## 2017-08-06 DIAGNOSIS — I10 Essential (primary) hypertension: Secondary | ICD-10-CM | POA: Diagnosis not present

## 2017-08-06 LAB — BASIC METABOLIC PANEL
Anion gap: 4 — ABNORMAL LOW (ref 5–15)
BUN: 22 mg/dL — ABNORMAL HIGH (ref 6–20)
CO2: 30 mmol/L (ref 22–32)
Calcium: 8.8 mg/dL — ABNORMAL LOW (ref 8.9–10.3)
Chloride: 104 mmol/L (ref 101–111)
Creatinine, Ser: 0.75 mg/dL (ref 0.44–1.00)
GFR calc Af Amer: >60 mL/min (ref >60)
GFR calc non Af Amer: >60 mL/min (ref >60)
Glucose, Bld: 117 mg/dL — ABNORMAL HIGH (ref 65–99)
Potassium: 3.6 mmol/L (ref 3.5–5.1)
Sodium: 138 mmol/L (ref 135–145)

## 2017-08-06 LAB — TROPONIN I
Troponin I: <0.03 ng/mL (ref <0.03)
Troponin I: <0.03 ng/mL (ref <0.03)

## 2017-08-06 LAB — CBC
HCT: 36.5 % (ref 35.0–47.0)
Hemoglobin: 11.7 g/dL — ABNORMAL LOW (ref 12.0–16.0)
MCH: 24.9 pg — ABNORMAL LOW (ref 26.0–34.0)
MCHC: 32 g/dL (ref 32.0–36.0)
MCV: 77.7 fL — ABNORMAL LOW (ref 80.0–100.0)
Platelets: 272 10*3/uL (ref 150–440)
RBC: 4.7 MIL/uL (ref 3.80–5.20)
RDW: 16 % — ABNORMAL HIGH (ref 11.5–14.5)
WBC: 7.8 10*3/uL (ref 3.6–11.0)

## 2017-08-06 LAB — FIBRIN DERIVATIVES D-DIMER (ARMC ONLY): Fibrin derivatives D-dimer (ARMC): 709.28 ng/mL (FEU) — ABNORMAL HIGH (ref 0.00–499.00)

## 2017-08-06 MED ORDER — IOPAMIDOL (ISOVUE-370) INJECTION 76%
75.0000 mL | Freq: Once | INTRAVENOUS | Status: AC | PRN
  Administered 2017-08-06: 75 mL via INTRAVENOUS

## 2017-08-06 MED ORDER — ASPIRIN 81 MG PO CHEW
324.0000 mg | CHEWABLE_TABLET | Freq: Once | ORAL | Status: AC
  Administered 2017-08-06: 324 mg via ORAL
  Filled 2017-08-06: qty 4

## 2017-08-06 MED ORDER — SODIUM CHLORIDE 0.9 % IV BOLUS
1000.0000 mL | Freq: Once | INTRAVENOUS | Status: AC
  Administered 2017-08-06: 1000 mL via INTRAVENOUS

## 2017-08-06 NOTE — ED Notes (Addendum)
Patient transported to CT 

## 2017-08-06 NOTE — ED Notes (Signed)
Ambulated to restroom  

## 2017-08-06 NOTE — ED Notes (Signed)
Patient transported to X-ray 

## 2017-08-06 NOTE — ED Triage Notes (Signed)
Patient here complaining of centralized chest pain that woke her up this AM at 0500, states "it feels like something sticking me in my chest, going up into my jaw".  Patient states her bowels got loose.  Denies SHOB, nause, or diaphoresis.  Skin warm and dry.

## 2017-08-06 NOTE — ED Notes (Signed)
Patient to room 26 via WC.  Bill RN aware of room placement.

## 2017-08-06 NOTE — ED Provider Notes (Signed)
[facility] Emergency Department Provider Note  ____________________________________________   First MD Initiated Contact with Patient 08/06/17 [phone]     (approximate)  I have reviewed the triage vital signs and the nursing notes.   HISTORY  Chief Complaint Chest Pain   HPI Helen Mccormick is a 77 y.o. female with a history of anxiety, COPD as well as colon polyps who is presenting to the emergency department today with sudden onset chest pain that woke him from sleep at 5 AM.  She says that the center of her chest is location of her chest pain but that it is radiating through to her back as well as up to her throat.  She says the pain is a pressure-like pain that is an 8 out of 10.  Says that it comes and goes and last for about a half an hour when it is there.  It is not worsened with exertion.  Not associated with shortness of breath, nausea or vomiting.  However, the patient does state that she has had 2 episodes of loose stool this morning.  Does not report any blood.  States that she has a family history of heart disease but is never had any stenting or open heart surgery.  Says that she has had similar chest pain for the past several years.  Past Medical History:  Diagnosis Date  . Allergy   . Anxiety   . Asthma   . Colon polyp   . Depression   . Diabetes mellitus without complication ([place])   . GERD (gastroesophageal reflux disease)   . Heart murmur   . Hyperlipidemia   . Hypertension   . Sleep apnea   . Thyroid disease     Patient Active Problem List   Diagnosis Date Noted  . Fracture of distal end of radius 06/03/2017  . Hypertension 03/24/2017  . DM (diabetes mellitus), type 2 ([place]) 03/24/2017  . 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 Hyperlipidemia due to type 2 diabetes mellitus ([place]) 03/24/2017  . Cervical radiculitis 12/14/2013  . DDD (degenerative disc disease), cervical 12/14/2013  . Left shoulder pain 12/14/2013    Past  Surgical History:  Procedure Laterality Date  . ABDOMINAL HYSTERECTOMY    . BACK SURGERY    . BACK SURGERY    . CERVICAL FUSION    . COLONOSCOPY WITH PROPOFOL N/A 05/28/2017   Procedure: COLONOSCOPY WITH PROPOFOL;  Surgeon: Jonathon Bellows, MD;  Location: [facility] ENDOSCOPY;  Service: Gastroenterology;  Laterality: N/A;  . NECK SURGERY    . THYROID SURGERY    . THYROID SURGERY      Prior to Admission medications   Medication Sig Start Date End Date Taking? Authorizing Provider  albuterol (PROVENTIL HFA;VENTOLIN HFA) 108 (90 Base) MCG/ACT inhaler Inhale into the lungs every 6 (six) hours as needed for wheezing or shortness of breath.    [provider]  amLODipine (NORVASC) 10 MG tablet TAKE 1 TABLET BY MOUTH EVERY DAY 06/21/17   Lavera Guise, MD  aspirin 81 MG chewable tablet Chew 81 mg by mouth daily.     [provider]  atorvastatin (LIPITOR) 20 MG tablet Take 2 tablets (40 mg total) by mouth at bedtime. 07/06/17   [name], NP  cholecalciferol (VITAMIN D) 1000 units tablet Take 1 tablet by mouth daily.     [provider]  escitalopram (LEXAPRO) 10 MG tablet TAKE 1 TABLET BY MOUTH EVERY DAY FOR DEPRESSION 05/11/17   [name], NP  ferrous sulfate 325 (65 FE) MG tablet Take 325 mg by mouth daily with breakfast.    [provider]  fluticasone (FLONASE) 50 MCG/ACT nasal spray Place 2 sprays into both nostrils daily.    [provider]  gabapentin (NEURONTIN) 100 MG capsule Take 1 capsule (100 mg total) by mouth 3 (three) times daily as needed. 07/04/17 07/04/18  Delman Kitten, MD  glucose blood test strip Use 1 strip to check your blood sugar two times daily. 03/12/17   [name], NP  hydrochlorothiazide (HYDRODIURIL) 12.5 MG tablet Take 1 tablet (12.5 mg total) by mouth daily. 07/06/17   [name], NP  levothyroxine (SYNTHROID, LEVOTHROID) 75 MCG tablet Take 1 tablet (75 mcg total) by mouth daily before breakfast.  05/31/17   [name], NP  lubiprostone (AMITIZA) 8 MCG capsule Take 8 mcg by mouth 2 (two) times daily with a meal.    [provider]  naproxen (NAPROSYN) 500 MG tablet Take 1 tablet (500 mg total) by mouth 2 (two) times daily with a meal. 10/20/15   Hagler, Jami L, PA-C  Omega-3 1000 MG CAPS Take 1 capsule by mouth daily.     [provider]  omeprazole (PRILOSEC) 40 MG capsule Take 1 capsule (40 mg total) by mouth daily. 07/06/17   [name], NP  predniSONE (DELTASONE) 10 MG tablet Day 1-2 take 6 pills. Day 3 take 5 pills then reduce by 1 pill each day. 07/06/17   [name], NP    Allergies Amlodipine; Etodolac; and Tramadol  Family History  Problem Relation Age of Onset  . Breast cancer Maternal Aunt 60  . Diabetes Mother   . Leukemia Mother   . Diabetes Sister   . Mental illness Sister   . Colon polyps Sister   . Stroke Father   . Cancer Brother        rectal  . Healthy Daughter   . Hypertension Son   . Diabetes Sister   . Thyroid disease Sister   . Healthy Sister   . Healthy Sister   . Stroke Brother   . Healthy Son   . Healthy Son   . Healthy Daughter     Social History Social History   Tobacco Use  . Smoking status: Never Smoker  . Smokeless tobacco: Never Used  Substance Use Topics  . Alcohol use: No  . Drug use: No    Review of Systems  Constitutional: No fever/chills Eyes: No visual changes. ENT: No sore throat. Cardiovascular: As above Respiratory: Denies shortness of breath. Gastrointestinal: No abdominal pain.  No nausea, no vomiting.  No diarrhea.  No constipation. Genitourinary: Negative for dysuria. Musculoskeletal: As above Skin: Negative for rash. Neurological: Negative for headaches, focal weakness or numbness.   ____________________________________________   PHYSICAL EXAM:  VITAL SIGNS: ED Triage Vitals  Enc Vitals Group     BP 08/06/17 0807 (!) 156/68     Pulse Rate 08/06/17 0807  62     Resp 08/06/17 0807 16     Temp 08/06/17 0807 97.7 F (36.5 C)     Temp Source 08/06/17 0807 Oral     SpO2 08/06/17 0807 98 %     Weight 08/06/17 0808 153 lb (69.4 kg)     Height 08/06/17 0808 5\' 4"  (1.626 m)     Head Circumference --      Peak Flow --      Pain Score 08/06/17 0808 9  Pain Loc --      Pain Edu? --      Excl. in [place]? --     Constitutional: Alert and oriented. Well appearing and in no acute distress. Eyes: Conjunctivae are normal.  Head: Atraumatic. Nose: No congestion/rhinnorhea. Mouth/Throat: Mucous membranes are moist.  Neck: No stridor.   Cardiovascular: Normal rate, regular rhythm. Grossly normal heart sounds.  Good peripheral circulation with equal and bilateral radial as well as dorsalis pedis pulses.  The chest pain is not reproducible to palpation. Respiratory: Normal respiratory effort.  No retractions. Lungs CTAB. Gastrointestinal: Soft and nontender. No distention. No CVA tenderness. Musculoskeletal: No lower extremity tenderness nor edema.  No joint effusions.  No tenderness to palpation to the thoracic back. Neurologic:  Normal speech and language. No gross focal neurologic deficits are appreciated. Skin:  Skin is warm, dry and intact. No rash noted. Psychiatric: Mood and affect are normal. Speech and behavior are normal.  ____________________________________________   LABS (all labs ordered are listed, but only abnormal results are displayed)  Labs Reviewed  BASIC METABOLIC PANEL - Abnormal; Notable for the following components:      Result Value   Glucose, Bld 117 (*)    BUN 22 (*)    Calcium 8.8 (*)    Anion gap 4 (*)    All other components within normal limits  CBC - Abnormal; Notable for the following components:   Hemoglobin 11.7 (*)    MCV 77.7 (*)    MCH 24.9 (*)    RDW 16.0 (*)    All other components within normal limits  FIBRIN DERIVATIVES D-DIMER ([facility] ONLY) - Abnormal; Notable for the following components:   Fibrin  derivatives D-dimer [facility]) 709.28 (*)    All other components within normal limits  TROPONIN I  TROPONIN I   ____________________________________________  EKG  ED ECG REPORT I, Doran Stabler, the attending physician, personally viewed and interpreted this ECG.   Date: 08/06/2017  EKG Time: 0804  Rate: 61  Rhythm: normal sinus rhythm  Axis: Left  Intervals:left bundle branch block  ST&T Change: ST elevations consistent with left bundle branch block.  No abnormal T wave inversions.  No change from previous.  ____________________________________________  RADIOLOGY  No evidence of cardiac disease on the chest x-ray  CT of the chest which is negative for acute PE.  4 cm ascending thoracic aortic aneurysm with recommendation of annual imaging follow-up. ____________________________________________   PROCEDURES  Procedure(s) performed:   Procedures  Critical Care performed:   ____________________________________________   INITIAL IMPRESSION / ASSESSMENT AND PLAN / ED COURSE  Pertinent labs & imaging results that were available during my care of the patient were reviewed by me and considered in my medical decision making (see chart for details).  Differential diagnosis includes, but is not limited to, ACS, aortic dissection, pulmonary embolism, cardiac tamponade, pneumothorax, pneumonia, pericarditis, myocarditis, GI-related causes including esophagitis/gastritis, and musculoskeletal chest wall pain.   As part of my medical decision making, I reviewed the following data within the electronic MEDICAL RECORD NUMBER Notes from prior ED visits  ----------------------------------------- 11:49 AM on 08/06/2017 -----------------------------------------  Patient at this time without any further complaint of chest pain.  Resting comfortably.  Requesting crackers and juice.  Reassuring work-up with 2- troponins.  No evidence of PE on the CAT scan of the chest.  No suggestion of  dissection or stenosis in the thoracic aorta.  We discussed the aortic aneurysm and annual follow-up.  I will also be  referring the patient to cardiology because of her ongoing issues with chest pain.  However, I do not believe she needs to be admitted to the hospital this time.  Unchanged EKG and reassuring cardiac work-up otherwise. ____________________________________________   FINAL CLINICAL IMPRESSION(S) / ED DIAGNOSES  Chest pain.  Thoracic aortic aneurysm.    NEW MEDICATIONS STARTED DURING THIS VISIT:  New Prescriptions   No medications on file     Note:  This document was prepared using Dragon voice recognition software and may include unintentional dictation errors.     Orbie Pyo, MD 08/06/17 [phone]

## 2017-08-13 ENCOUNTER — Encounter: Payer: Self-pay | Admitting: Nurse Practitioner

## 2017-08-13 ENCOUNTER — Ambulatory Visit (INDEPENDENT_AMBULATORY_CARE_PROVIDER_SITE_OTHER): Payer: Medicare Other | Admitting: Nurse Practitioner

## 2017-08-13 VITALS — BP 127/71 | HR 96 | Ht 64.0 in | Wt 163.0 lb

## 2017-08-13 DIAGNOSIS — G8929 Other chronic pain: Secondary | ICD-10-CM | POA: Diagnosis not present

## 2017-08-13 DIAGNOSIS — M546 Pain in thoracic spine: Secondary | ICD-10-CM | POA: Diagnosis not present

## 2017-08-13 DIAGNOSIS — E890 Postprocedural hypothyroidism: Secondary | ICD-10-CM

## 2017-08-13 DIAGNOSIS — M7632 Iliotibial band syndrome, left leg: Secondary | ICD-10-CM | POA: Diagnosis not present

## 2017-08-13 DIAGNOSIS — F419 Anxiety disorder, unspecified: Secondary | ICD-10-CM

## 2017-08-13 MED ORDER — BACLOFEN 10 MG PO TABS: 10.0000 mg | ORAL_TABLET | Freq: Every evening | ORAL | 0 refills | Status: DC | PRN

## 2017-08-13 MED ORDER — ESCITALOPRAM OXALATE 10 MG PO TABS: ORAL_TABLET | ORAL | 5 refills | Status: DC

## 2017-08-13 NOTE — Progress Notes (Signed)
Subjective:    Patient ID: Helen Mccormick, female    DOB: 25-Aug-1940, 77 y.o.   MRN: [id number]  Helen Mccormick is a 77 y.o. female presenting on 08/13/2017 for Emergency room Follow-up (chest pain, diagnosed w/ left bundle branch block, pt saw Cardilogist x 1 week ago) and Shoulder Pain (Right shoulder pain)   HPI ED Followup Visit Hospital/Location: [facility] ED Date of ED Visit: 08/06/2017 Reason for Presenting to ED: Nonspecific chest pain Primary (+Secondary) Diagnosis: TAA without rupture (stable LBBB with EKG comparison)  FOLLOW-UP  - ED provider note and record have been reviewed - Patient presents today about 7 days after recent ED visit. Brief summary of recent course, patient had symptoms of chest pain, presented to ED on 08/06/2017, testing in ED included EKG, troponins D-dimer, treated with aspirin, fluids.  CT angio chest r/o PE and was found to have stable TAA with/o rupture - Today reports overall has done well after discharge from ED. Symptoms of chest pain are mostly resolved.  Intermittently is hot/cold/sweaty since chest pain and since visit to ER. Thoracic back pain,  Occasionally wakes at night, feels when she lies down. - New medications on discharge: none  - Changes to current meds on discharge: none  Left hip pain - sharp pain when sitting for long period of time.  Is walking to relieve pain.  Standing still, and sitting worsens pain.  No medications help pain.  Nerve pill - helped her get calm - slept better when taking this, also had fewer hot flashes/cold chills and sweats - insomnia was also improved.  Patient requests refills.  Social History   Tobacco Use  . Smoking status: Never Smoker  . Smokeless tobacco: Never Used  Substance Use Topics  . Alcohol use: No  . Drug use: No    Review of Systems Per HPI unless specifically indicated above  Outpatient Encounter Medications as of 08/13/2017  Medication Sig  . albuterol (PROVENTIL HFA;VENTOLIN HFA)  108 (90 Base) MCG/ACT inhaler Inhale into the lungs every 6 (six) hours as needed for wheezing or shortness of breath.  Marland Kitchen amLODipine (NORVASC) 10 MG tablet TAKE 1 TABLET BY MOUTH EVERY DAY  . aspirin 81 MG chewable tablet Chew 81 mg by mouth daily.   Marland Kitchen atorvastatin (LIPITOR) 20 MG tablet Take 2 tablets (40 mg total) by mouth at bedtime.  . fluticasone (FLONASE) 50 MCG/ACT nasal spray Place 2 sprays into both nostrils daily.  Marland Kitchen glucose blood test strip Use 1 strip to check your blood sugar two times daily.  . hydrochlorothiazide (HYDRODIURIL) 12.5 MG tablet Take 1 tablet (12.5 mg total) by mouth daily.  Marland Kitchen levothyroxine (SYNTHROID, LEVOTHROID) 75 MCG tablet Take 1 tablet (75 mcg total) by mouth daily before breakfast.  . lubiprostone (AMITIZA) 8 MCG capsule Take 8 mcg by mouth 2 (two) times daily with a meal.  . naproxen (NAPROSYN) 500 MG tablet Take 1 tablet (500 mg total) by mouth 2 (two) times daily with a meal.  . Omega-3 1000 MG CAPS Take 1 capsule by mouth daily.   Marland Kitchen omeprazole (PRILOSEC) 40 MG capsule Take 1 capsule (40 mg total) by mouth daily.  . cholecalciferol (VITAMIN D) 1000 units tablet Take 1 tablet by mouth daily.   Marland Kitchen escitalopram (LEXAPRO) 10 MG tablet TAKE 1 TABLET BY MOUTH EVERY DAY FOR DEPRESSION (Patient not taking: Reported on 08/13/2017)  . ferrous sulfate 325 (65 FE) MG tablet Take 325 mg by mouth daily with breakfast.  . gabapentin (  NEURONTIN) 100 MG capsule Take 1 capsule (100 mg total) by mouth 3 (three) times daily as needed. (Patient not taking: Reported on 08/13/2017)  . [DISCONTINUED] predniSONE (DELTASONE) 10 MG tablet Day 1-2 take 6 pills. Day 3 take 5 pills then reduce by 1 pill each day. (Patient not taking: Reported on 08/13/2017)   No facility-administered encounter medications on file as of 08/13/2017.       Objective:    BP 127/71 (BP Location: Right Arm, Patient Position: Sitting, Cuff Size: Normal)   Pulse 96   Ht 5\' 4"  (1.626 m)   Wt 163 lb (73.9 kg)    BMI 27.98 kg/m   Wt Readings from Last 3 Encounters:  08/13/17 163 lb (73.9 kg)  08/06/17 153 lb (69.4 kg)  07/06/17 167 lb 3.2 oz (75.8 kg)    Physical Exam  Constitutional: She is oriented to person, place, and time. She appears well-developed and well-nourished. No distress.  HENT:  Head: Normocephalic and atraumatic.  Neck: Normal range of motion. Neck supple. Carotid bruit is not present.  Cardiovascular: Normal rate, regular rhythm, S1 normal, S2 normal, normal heart sounds and intact distal pulses.  Pulmonary/Chest: Effort normal and breath sounds normal. No respiratory distress.  Musculoskeletal: She exhibits no edema (pedal).  RIGHT Shoulder Inspection: Normal appearance bilateral symmetrical Palpation: Non-tender to palpation over anterior, lateral, or posterior shoulder  ROM: Limited AROM forward flexion, abduction, internal / external rotation, due to pain at limits Special Testing: Rotator cuff testing negative for weakness, but positive for pain with supraspinatus full can and empty can test, O'brien's negative for labral pain, Hawkin's AC impingement negative for pain Strength: Normal strength 5/5 flex/ext, ext rot / int rot, grip, rotator cuff str testing. Neurovascular: Distally intact pulses, sensation to light touch   Neurological: She is alert and oriented to person, place, and time.  Skin: Skin is warm and dry.  Psychiatric: She has a normal mood and affect. Her behavior is normal.  Vitals reviewed.   Results for orders placed or performed during the hospital encounter of 61/95/09  Basic metabolic panel  Result Value Ref Range   Sodium 138 135 - 145 mmol/L   Potassium 3.6 3.5 - 5.1 mmol/L   Chloride 104 101 - 111 mmol/L   CO2 30 22 - 32 mmol/L   Glucose, Bld 117 (H) 65 - 99 mg/dL   BUN 22 (H) 6 - 20 mg/dL   Creatinine, Ser 0.75 0.44 - 1.00 mg/dL   Calcium 8.8 (L) 8.9 - 10.3 mg/dL   GFR calc non Af Amer >60 >60 mL/min   GFR calc Af Amer >60 >60 mL/min    Anion gap 4 (L) 5 - 15  CBC  Result Value Ref Range   WBC 7.8 3.6 - 11.0 K/uL   RBC 4.70 3.80 - 5.20 MIL/uL   Hemoglobin 11.7 (L) 12.0 - 16.0 g/dL   HCT 36.5 35.0 - 47.0 %   MCV 77.7 (L) 80.0 - 100.0 fL   MCH 24.9 (L) 26.0 - 34.0 pg   MCHC 32.0 32.0 - 36.0 g/dL   RDW 16.0 (H) 11.5 - 14.5 %   Platelets 272 150 - 440 K/uL  Troponin I  Result Value Ref Range   Troponin I <0.03 <0.03 ng/mL  Fibrin derivatives D-Dimer ([facility] only)  Result Value Ref Range   Fibrin derivatives D-dimer ([facility]) 709.28 (H) 0.00 - 499.00 ng/mL (FEU)  Troponin I  Result Value Ref Range   Troponin I <0.03 <0.03 ng/mL  Assessment & Plan:   Problem List Items Addressed This Visit      Endocrine   Hypothyroidism, postsurgical - Primary Possible cause of chest pain/symptoms if HR elevated with chest pain.  Patient without recent TSH check.  Recheck labs today.  Follow-up 3 months    Relevant Orders   TSH    Other Visit Diagnoses    Anxiety     Currently uncontrolled.  Patient off escitalopram.  Resume.  Refill sent.  Follow-up 3 months.   Relevant Medications   escitalopram (LEXAPRO) 10 MG tablet   It band syndrome, left     Pain likely self-limited.  Muscle strain possible.  Plan:  1. Treat with OTC pain meds (acetaminophen and ibuprofen).  Discussed alternate dosing and max dosing. 2. Apply heat and/or ice to affected area. 3. May also apply a muscle rub with lidocaine or lidocaine patch after heat or ice. 4. Take muscle relaxer baclofen 10 mg up to three times daily.  Cautioned drowsiness. 5. Follow up prn 2-4 weeks     Relevant Medications   baclofen (LIORESAL) 10 MG tablet   Chronic midline thoracic back pain     Also, likely muscle strain.  See AP IT band syndrome above.   Relevant Medications   escitalopram (LEXAPRO) 10 MG tablet   baclofen (LIORESAL) 10 MG tablet      Meds ordered this encounter  Medications  . escitalopram (LEXAPRO) 10 MG tablet    Sig: TAKE 1 TABLET BY MOUTH  EVERY DAY FOR DEPRESSION    Dispense:  30 tablet    Refill:  5    Order Specific Question:   Supervising Provider    Answer:   Olin Hauser [[id number]]  . baclofen (LIORESAL) 10 MG tablet    Sig: Take 1 tablet (10 mg total) by mouth at bedtime as needed for muscle spasms.    Dispense:  20 each    Refill:  0    Order Specific Question:   Supervising Provider    Answer:   Olin Hauser [[id number]]   I have reviewed the ED notes and have reconciled the current and discharge medications today.  Follow up plan: Return in about 3 months (around 11/13/2017) for hypertension and anxiety.   Cassell Smiles, DNP, AGPCNP-BC Adult Gerontology Primary Care Nurse Practitioner [facility] 08/13/2017, 1:52 PM

## 2017-08-13 NOTE — Patient Instructions (Addendum)
Helen Mccormick "[name]",   Thank you for coming in to clinic today.  1. You will be due for BLOOD WORK.  This means you should eat no food or drink after midnight.  Drink only water or coffee without cream/sugar on the morning of your lab visit. - Please go ahead and schedule a "Lab Only" visit in the morning at the clinic for lab draw in the next 7 days. - Your results will be available about 2-3 days after blood draw.  If you have set up a MyChart account, you can can log in to MyChart online to view your results and a brief explanation. Also, we can discuss your results together at your next office visit if you would like.   2. Keep your Cardiology appointments and workup.  3. Resume your escitalopram 10 mg tablet.  4. You have a IT band (outside leg near hip) and back muscle strain.  - Start taking Tylenol extra strength 1 to 2 tablets every 6-8 hours for aches or fever/chills for next few days as needed.  Do not take more than 3,000 mg in 24 hours from all medicines.  May take Ibuprofen as well if tolerated 200-400mg  every 8 hours as needed. May alternate tylenol and ibuprofen in same day. - Use heat and ice.  Apply this for 15 minutes at a time 6-8 times per day.   - Muscle rub with lidocaine, lidocaine patch, Biofreeze, or tiger balm for topical pain relief.  Avoid using this with heat and ice to avoid burns. - START muscle relaxer baclofen 10 mg one tablet at bedtime.  This can cause drowsiness, so use caution.  Only take this at night for helping you during sleep.   Please schedule a follow-up appointment with Cassell Smiles, AGNP. Return in about 3 months (around 11/13/2017) for hypertension and anxiety.  If you have any other questions or concerns, please feel free to call the clinic or send a message through [place]. You may also schedule an earlier appointment if necessary.  You will receive a survey after today's visit either digitally by e-mail or paper by [employer]. Your  experiences and feedback matter to [name].  Please respond so we know how we are doing as we provide care for you.   Cassell Smiles, DNP, AGNP-BC Adult Gerontology Nurse Practitioner [place]

## 2017-08-15 ENCOUNTER — Observation Stay
Admission: EM | Admit: 2017-08-15 | Discharge: 2017-08-16 | Disposition: A | Discharge status: Home / Self Care | Payer: Medicare Other | Attending: Internal Medicine | Admitting: Internal Medicine

## 2017-08-15 ENCOUNTER — Emergency Department: Payer: Medicare Other

## 2017-08-15 ENCOUNTER — Observation Stay: Admit: 2017-08-15 | Payer: Medicare Other

## 2017-08-15 ENCOUNTER — Encounter: Payer: Self-pay | Admitting: Emergency Medicine

## 2017-08-15 ENCOUNTER — Other Ambulatory Visit: Payer: Self-pay

## 2017-08-15 DIAGNOSIS — F419 Anxiety disorder, unspecified: Secondary | ICD-10-CM | POA: Diagnosis not present

## 2017-08-15 DIAGNOSIS — E785 Hyperlipidemia, unspecified: Secondary | ICD-10-CM | POA: Insufficient documentation

## 2017-08-15 DIAGNOSIS — Z888 Allergy status to other drugs, medicaments and biological substances status: Secondary | ICD-10-CM | POA: Diagnosis not present

## 2017-08-15 DIAGNOSIS — G4730 Sleep apnea, unspecified: Secondary | ICD-10-CM | POA: Diagnosis not present

## 2017-08-15 DIAGNOSIS — K219 Gastro-esophageal reflux disease without esophagitis: Secondary | ICD-10-CM | POA: Insufficient documentation

## 2017-08-15 DIAGNOSIS — Z794 Long term (current) use of insulin: Secondary | ICD-10-CM | POA: Insufficient documentation

## 2017-08-15 DIAGNOSIS — Z79899 Other long term (current) drug therapy: Secondary | ICD-10-CM | POA: Insufficient documentation

## 2017-08-15 DIAGNOSIS — J45909 Unspecified asthma, uncomplicated: Secondary | ICD-10-CM | POA: Diagnosis not present

## 2017-08-15 DIAGNOSIS — I447 Left bundle-branch block, unspecified: Secondary | ICD-10-CM | POA: Diagnosis not present

## 2017-08-15 DIAGNOSIS — E079 Disorder of thyroid, unspecified: Secondary | ICD-10-CM | POA: Diagnosis not present

## 2017-08-15 DIAGNOSIS — Z7982 Long term (current) use of aspirin: Secondary | ICD-10-CM | POA: Diagnosis not present

## 2017-08-15 DIAGNOSIS — Z79890 Hormone replacement therapy (postmenopausal): Secondary | ICD-10-CM | POA: Insufficient documentation

## 2017-08-15 DIAGNOSIS — R079 Chest pain, unspecified: Secondary | ICD-10-CM | POA: Diagnosis present

## 2017-08-15 DIAGNOSIS — F329 Major depressive disorder, single episode, unspecified: Secondary | ICD-10-CM | POA: Insufficient documentation

## 2017-08-15 DIAGNOSIS — E119 Type 2 diabetes mellitus without complications: Secondary | ICD-10-CM | POA: Diagnosis not present

## 2017-08-15 DIAGNOSIS — I10 Essential (primary) hypertension: Secondary | ICD-10-CM | POA: Diagnosis not present

## 2017-08-15 DIAGNOSIS — I208 Other forms of angina pectoris: Secondary | ICD-10-CM | POA: Diagnosis present

## 2017-08-15 LAB — CREATININE, SERUM
Creatinine, Ser: 1.02 mg/dL — ABNORMAL HIGH (ref 0.44–1.00)
GFR calc Af Amer: >60 mL/min (ref >60)
GFR calc non Af Amer: 53 mL/min — ABNORMAL LOW (ref >60)

## 2017-08-15 LAB — CBC
HCT: 38 % (ref 35.0–47.0)
HCT: 37.9 % (ref 35.0–47.0)
Hemoglobin: 12.2 g/dL (ref 12.0–16.0)
Hemoglobin: 12.8 g/dL (ref 12.0–16.0)
MCH: 24.9 pg — ABNORMAL LOW (ref 26.0–34.0)
MCH: 25.4 pg — ABNORMAL LOW (ref 26.0–34.0)
MCHC: 32.2 g/dL (ref 32.0–36.0)
MCHC: 33.8 g/dL (ref 32.0–36.0)
MCV: 77.3 fL — ABNORMAL LOW (ref 80.0–100.0)
MCV: 75.1 fL — ABNORMAL LOW (ref 80.0–100.0)
Platelets: 228 10*3/uL (ref 150–440)
Platelets: 230 10*3/uL (ref 150–440)
RBC: 4.92 MIL/uL (ref 3.80–5.20)
RBC: 5.05 MIL/uL (ref 3.80–5.20)
RDW: 15.5 % — ABNORMAL HIGH (ref 11.5–14.5)
RDW: 15.7 % — ABNORMAL HIGH (ref 11.5–14.5)
WBC: 8.9 10*3/uL (ref 3.6–11.0)
WBC: 7 10*3/uL (ref 3.6–11.0)

## 2017-08-15 LAB — GLUCOSE, CAPILLARY
Glucose-Capillary: 116 mg/dL — ABNORMAL HIGH (ref 65–99)
Glucose-Capillary: 92 mg/dL (ref 65–99)
Glucose-Capillary: 151 mg/dL — ABNORMAL HIGH (ref 65–99)

## 2017-08-15 LAB — BASIC METABOLIC PANEL
Anion gap: 12 (ref 5–15)
BUN: 36 mg/dL — ABNORMAL HIGH (ref 6–20)
CO2: 26 mmol/L (ref 22–32)
Calcium: 9.1 mg/dL (ref 8.9–10.3)
Chloride: 96 mmol/L — ABNORMAL LOW (ref 101–111)
Creatinine, Ser: 1.1 mg/dL — ABNORMAL HIGH (ref 0.44–1.00)
GFR calc Af Amer: 56 mL/min — ABNORMAL LOW (ref >60)
GFR calc non Af Amer: 48 mL/min — ABNORMAL LOW (ref >60)
Glucose, Bld: 119 mg/dL — ABNORMAL HIGH (ref 65–99)
Potassium: 3.4 mmol/L — ABNORMAL LOW (ref 3.5–5.1)
Sodium: 134 mmol/L — ABNORMAL LOW (ref 135–145)

## 2017-08-15 LAB — TROPONIN I
Troponin I: <0.03 ng/mL (ref <0.03)
Troponin I: <0.03 ng/mL (ref <0.03)
Troponin I: <0.03 ng/mL (ref <0.03)

## 2017-08-15 MED ORDER — BACLOFEN 10 MG PO TABS
10.0000 mg | ORAL_TABLET | Freq: Every evening | ORAL | Status: DC | PRN
  Administered 2017-08-15: 10 mg via ORAL
  Filled 2017-08-15: qty 1

## 2017-08-15 MED ORDER — ACETAMINOPHEN 325 MG PO TABS: 650.0000 mg | ORAL_TABLET | ORAL | Status: DC | PRN

## 2017-08-15 MED ORDER — ATORVASTATIN CALCIUM 20 MG PO TABS
40.0000 mg | ORAL_TABLET | Freq: Every day | ORAL | Status: DC
  Administered 2017-08-15: 40 mg via ORAL
  Filled 2017-08-15: qty 2

## 2017-08-15 MED ORDER — SODIUM CHLORIDE 0.9 % IV SOLN: 250.0000 mL | INTRAVENOUS | Status: DC | PRN

## 2017-08-15 MED ORDER — SODIUM CHLORIDE 0.9% FLUSH: 3.0000 mL | INTRAVENOUS | Status: DC | PRN

## 2017-08-15 MED ORDER — OMEGA-3-ACID ETHYL ESTERS 1 G PO CAPS
1.0000 | ORAL_CAPSULE | Freq: Every day | ORAL | Status: DC
  Administered 2017-08-15 – 2017-08-16 (×2): 1 g via ORAL
  Filled 2017-08-15 (×2): qty 1

## 2017-08-15 MED ORDER — ALBUTEROL SULFATE (2.5 MG/3ML) 0.083% IN NEBU: 3.0000 mL | INHALATION_SOLUTION | Freq: Four times a day (QID) | RESPIRATORY_TRACT | Status: DC | PRN

## 2017-08-15 MED ORDER — HYDROCHLOROTHIAZIDE 25 MG PO TABS
12.5000 mg | ORAL_TABLET | Freq: Every day | ORAL | Status: DC
  Administered 2017-08-15 – 2017-08-16 (×2): 12.5 mg via ORAL
  Filled 2017-08-15 (×2): qty 1

## 2017-08-15 MED ORDER — CARVEDILOL 3.125 MG PO TABS
3.1250 mg | ORAL_TABLET | Freq: Two times a day (BID) | ORAL | Status: DC
  Administered 2017-08-15 – 2017-08-16 (×2): 3.125 mg via ORAL
  Filled 2017-08-15 (×2): qty 1

## 2017-08-15 MED ORDER — ONDANSETRON HCL 4 MG/2ML IJ SOLN: 4.0000 mg | Freq: Four times a day (QID) | INTRAMUSCULAR | Status: DC | PRN

## 2017-08-15 MED ORDER — INSULIN ASPART 100 UNIT/ML ~~LOC~~ SOLN: 0.0000 [IU] | Freq: Every day | SUBCUTANEOUS | Status: DC

## 2017-08-15 MED ORDER — ASPIRIN EC 81 MG PO TBEC
81.0000 mg | DELAYED_RELEASE_TABLET | Freq: Every day | ORAL | Status: DC
Start: 2017-08-16 — End: 2017-08-15

## 2017-08-15 MED ORDER — NITROGLYCERIN 2 % TD OINT
1.0000 [in_us] | TOPICAL_OINTMENT | Freq: Four times a day (QID) | TRANSDERMAL | Status: DC
  Filled 2017-08-15: qty 1

## 2017-08-15 MED ORDER — LUBIPROSTONE 8 MCG PO CAPS
8.0000 ug | ORAL_CAPSULE | Freq: Two times a day (BID) | ORAL | Status: DC
  Administered 2017-08-15 – 2017-08-16 (×2): 8 ug via ORAL
  Filled 2017-08-15 (×2): qty 1

## 2017-08-15 MED ORDER — VITAMIN D3 25 MCG (1000 UNIT) PO TABS
1000.0000 [IU] | ORAL_TABLET | Freq: Every day | ORAL | Status: DC
  Administered 2017-08-15 – 2017-08-16 (×2): 1000 [IU] via ORAL
  Filled 2017-08-15 (×2): qty 1

## 2017-08-15 MED ORDER — FLUTICASONE PROPIONATE 50 MCG/ACT NA SUSP
2.0000 | Freq: Every day | NASAL | Status: DC
  Administered 2017-08-15 – 2017-08-16 (×2): 2 via NASAL
  Filled 2017-08-15: qty 16

## 2017-08-15 MED ORDER — ASPIRIN 81 MG PO CHEW
324.0000 mg | CHEWABLE_TABLET | Freq: Once | ORAL | Status: AC
  Administered 2017-08-15: 324 mg via ORAL
  Filled 2017-08-15: qty 4

## 2017-08-15 MED ORDER — ASPIRIN 300 MG RE SUPP: 300.0000 mg | RECTAL | Status: AC

## 2017-08-15 MED ORDER — PANTOPRAZOLE SODIUM 40 MG PO TBEC
40.0000 mg | DELAYED_RELEASE_TABLET | Freq: Every day | ORAL | Status: DC
Start: 2017-08-15 — End: 2017-08-16
  Administered 2017-08-15 – 2017-08-16 (×2): 40 mg via ORAL
  Filled 2017-08-15 (×2): qty 1

## 2017-08-15 MED ORDER — FERROUS SULFATE 325 (65 FE) MG PO TABS
325.0000 mg | ORAL_TABLET | Freq: Every day | ORAL | Status: DC
  Administered 2017-08-16: 325 mg via ORAL
  Filled 2017-08-15: qty 1

## 2017-08-15 MED ORDER — ENOXAPARIN SODIUM 40 MG/0.4ML ~~LOC~~ SOLN: 40.0000 mg | SUBCUTANEOUS | Status: DC

## 2017-08-15 MED ORDER — SODIUM CHLORIDE 0.9% FLUSH
3.0000 mL | Freq: Two times a day (BID) | INTRAVENOUS | Status: DC
  Administered 2017-08-15 – 2017-08-16 (×3): 3 mL via INTRAVENOUS

## 2017-08-15 MED ORDER — ESCITALOPRAM OXALATE 10 MG PO TABS
10.0000 mg | ORAL_TABLET | Freq: Every day | ORAL | Status: DC
  Administered 2017-08-15 – 2017-08-16 (×2): 10 mg via ORAL
  Filled 2017-08-15 (×2): qty 1

## 2017-08-15 MED ORDER — ASPIRIN 81 MG PO CHEW
81.0000 mg | CHEWABLE_TABLET | Freq: Every day | ORAL | Status: DC
  Administered 2017-08-15 – 2017-08-16 (×2): 81 mg via ORAL
  Filled 2017-08-15 (×2): qty 1

## 2017-08-15 MED ORDER — ASPIRIN 81 MG PO CHEW: 324.0000 mg | CHEWABLE_TABLET | ORAL | Status: AC

## 2017-08-15 MED ORDER — POTASSIUM CHLORIDE CRYS ER 20 MEQ PO TBCR
40.0000 meq | EXTENDED_RELEASE_TABLET | Freq: Once | ORAL | Status: AC
  Administered 2017-08-15: 40 meq via ORAL
  Filled 2017-08-15: qty 2

## 2017-08-15 MED ORDER — LEVOTHYROXINE SODIUM 50 MCG PO TABS
75.0000 ug | ORAL_TABLET | Freq: Every day | ORAL | Status: DC
  Administered 2017-08-16: 75 ug via ORAL
  Filled 2017-08-15: qty 1

## 2017-08-15 MED ORDER — NITROGLYCERIN 0.4 MG SL SUBL: 0.4000 mg | SUBLINGUAL_TABLET | SUBLINGUAL | Status: DC | PRN

## 2017-08-15 MED ORDER — INSULIN ASPART 100 UNIT/ML ~~LOC~~ SOLN: 0.0000 [IU] | Freq: Three times a day (TID) | SUBCUTANEOUS | Status: DC

## 2017-08-15 MED ORDER — MORPHINE SULFATE (PF) 2 MG/ML IV SOLN
2.0000 mg | INTRAVENOUS | Status: DC | PRN
  Administered 2017-08-15 (×2): 2 mg via INTRAVENOUS
  Filled 2017-08-15 (×2): qty 1

## 2017-08-15 NOTE — ED Notes (Signed)
Pt ambulatory to toilet with steady gait noted. Pt urinated and have BM.

## 2017-08-15 NOTE — Progress Notes (Signed)
Patient admitted to unit from ED. P here for CP. Patient complaining of chest pain 8 out 10 scale, describes pain as "stabbing from the back to the front". No changes on EKG, VSS. Dr. Estanislado Pandy notified and received orders for Morphine PRN. Will administer and continue to monitor.

## 2017-08-15 NOTE — Consult Note (Signed)
Helen Mccormick is a 77 y.o. female  [id number]  Primary Cardiologist: Neoma Laming Reason for Consultation: chest pain  HPI: [id number] with chest pain that woke her up. She was seen Friday in office with chest pain and is already set up for stress test.   Review of Systems: unremarkable   Past Medical History:  Diagnosis Date  . Allergy   . Anxiety   . Asthma   . Colon polyp   . Depression   . Diabetes mellitus without complication ([place])   . GERD (gastroesophageal reflux disease)   . Heart murmur   . Hyperlipidemia   . Hypertension   . Sleep apnea   . Thyroid disease     Medications Prior to Admission  Medication Sig Dispense Refill  . amLODipine (NORVASC) 10 MG tablet TAKE 1 TABLET BY MOUTH EVERY DAY 90 tablet 0  . aspirin 81 MG chewable tablet Chew 81 mg by mouth daily.     Marland Kitchen atorvastatin (LIPITOR) 20 MG tablet Take 2 tablets (40 mg total) by mouth at bedtime. 90 tablet 1  . cholecalciferol (VITAMIN D) 1000 units tablet Take 1 tablet by mouth daily.     Marland Kitchen escitalopram (LEXAPRO) 10 MG tablet TAKE 1 TABLET BY MOUTH EVERY DAY FOR DEPRESSION 30 tablet 5  . ferrous sulfate 325 (65 FE) MG tablet Take 325 mg by mouth daily with breakfast.    . fluticasone (FLONASE) 50 MCG/ACT nasal spray Place 2 sprays into both nostrils daily.    . hydrochlorothiazide (HYDRODIURIL) 12.5 MG tablet Take 1 tablet (12.5 mg total) by mouth daily. 90 tablet 3  . levothyroxine (SYNTHROID, LEVOTHROID) 75 MCG tablet Take 1 tablet (75 mcg total) by mouth daily before breakfast. 30 tablet 3  . lubiprostone (AMITIZA) 8 MCG capsule Take 8 mcg by mouth daily with breakfast.     . Omega-3 1000 MG CAPS Take 1 capsule by mouth daily.     Marland Kitchen omeprazole (PRILOSEC) 40 MG capsule Take 1 capsule (40 mg total) by mouth daily. 90 capsule 1  . albuterol (PROVENTIL HFA;VENTOLIN HFA) 108 (90 Base) MCG/ACT inhaler Inhale into the lungs every 6 (six) hours as needed for wheezing or shortness of breath.    . baclofen  (LIORESAL) 10 MG tablet Take 1 tablet (10 mg total) by mouth at bedtime as needed for muscle spasms. 20 each 0  . glucose blood test strip Use 1 strip to check your blood sugar two times daily. 100 each 12  . naproxen (NAPROSYN) 500 MG tablet Take 1 tablet (500 mg total) by mouth 2 (two) times daily with a meal. 14 tablet 0  . tiZANidine (ZANAFLEX) 2 MG tablet Take 1 tablet by mouth 2 (two) times daily as needed for muscle spasms.   5     . aspirin  324 mg Oral NOW   Or  . aspirin  300 mg Rectal NOW  . aspirin  81 mg Oral Daily  . atorvastatin  40 mg Oral QHS  . carvedilol  3.125 mg Oral BID WC  . cholecalciferol  1,000 Units Oral Daily  . enoxaparin (LOVENOX) injection  40 mg Subcutaneous Q24H  . escitalopram  10 mg Oral Daily  . [START ON 08/16/2017] ferrous sulfate  325 mg Oral Q breakfast  . fluticasone  2 spray Each Nare Daily  . hydrochlorothiazide  12.5 mg Oral Daily  . insulin aspart  0-15 Units Subcutaneous TID WC  . insulin aspart  0-5 Units Subcutaneous QHS  . [  START ON 08/16/2017] levothyroxine  75 mcg Oral QAC breakfast  . lubiprostone  8 mcg Oral BID WC  . omega-3 acid ethyl esters  1 capsule Oral Daily  . pantoprazole  40 mg Oral QAC breakfast  . sodium chloride flush  3 mL Intravenous Q12H    Infusions: . sodium chloride      Allergies  Allergen Reactions  . Amlodipine Hives and Swelling  . Etodolac     Other reaction(s): Abdominal Pain, Other (See Comments)  . Tramadol Other (See Comments)    dizziness     Social History   Socioeconomic History  . Marital status: Single    Spouse name: Not on file  . Number of children: Not on file  . Years of education: Not on file  . Highest education level: Not on file  Occupational History  . Occupation: retired  Scientific laboratory technician  . Financial resource strain: Not hard at all  . Food insecurity:    Worry: Never true    Inability: Never true  . Transportation needs:    Medical: No    Non-medical: No  Tobacco  Use  . Smoking status: Never Smoker  . Smokeless tobacco: Never Used  Substance and Sexual Activity  . Alcohol use: No  . Drug use: No  . Sexual activity: Not Currently  Lifestyle  . Physical activity:    Days per week: 2 days    Minutes per session: 50 min  . Stress: Only a little  Relationships  . Social connections:    Talks on phone: More than three times a week    Gets together: Once a week    Attends religious service: More than 4 times per year    Active member of club or organization: Yes    Attends meetings of clubs or organizations: More than 4 times per year    Relationship status: Never married  . Intimate partner violence:    Fear of current or ex partner: Patient refused    Emotionally abused: Patient refused    Physically abused: Patient refused    Forced sexual activity: Patient refused  Other Topics Concern  . Not on file  Social History Narrative  . Not on file    Family History  Problem Relation Age of Onset  . Breast cancer Maternal Aunt 60  . Diabetes Mother   . Leukemia Mother   . Diabetes Sister   . Mental illness Sister   . Colon polyps Sister   . Stroke Father   . Cancer Brother        rectal  . Healthy Daughter   . Hypertension Son   . Diabetes Sister   . Thyroid disease Sister   . Healthy Sister   . Healthy Sister   . Stroke Brother   . Healthy Son   . Healthy Son   . Healthy Daughter     PHYSICAL EXAM: Vitals:   08/15/17 1030 08/15/17 1118  BP: 140/78 139/75  Pulse:  85  Resp: 11 18  Temp:  98.1 F (36.7 C)  SpO2:  100%    No intake or output data in the 24 hours ending 08/15/17 1206  General:  Well appearing. No respiratory difficulty HEENT: normal Neck: supple. no JVD. Carotids 2+ bilat; no bruits. No lymphadenopathy or thryomegaly appreciated. Cor: PMI nondisplaced. Regular rate & rhythm. No rubs, gallops or murmurs. Lungs: clear Abdomen: soft, nontender, nondistended. No hepatosplenomegaly. No bruits or masses.  Good bowel sounds. Extremities: no cyanosis,  clubbing, rash, edema Neuro: alert & oriented x 3, cranial nerves grossly intact. moves all 4 extremities w/o difficulty. Affect pleasant.  ECG: NSR LBBB unchanged fro EKG office 2 days ago  Results for orders placed or performed during the hospital encounter of 08/15/17 (from the past 24 hour(s))  Basic metabolic panel     Status: Abnormal   Collection Time: 08/15/17  7:10 AM  Result Value Ref Range   Sodium 134 (L) 135 - 145 mmol/L   Potassium 3.4 (L) 3.5 - 5.1 mmol/L   Chloride 96 (L) 101 - 111 mmol/L   CO2 26 22 - 32 mmol/L   Glucose, Bld 119 (H) 65 - 99 mg/dL   BUN 36 (H) 6 - 20 mg/dL   Creatinine, Ser 1.10 (H) 0.44 - 1.00 mg/dL   Calcium 9.1 8.9 - 10.3 mg/dL   GFR calc non Af Amer 48 (L) >60 mL/min   GFR calc Af Amer 56 (L) >60 mL/min   Anion gap 12 5 - 15  CBC     Status: Abnormal   Collection Time: 08/15/17  7:10 AM  Result Value Ref Range   WBC 8.9 3.6 - 11.0 K/uL   RBC 4.92 3.80 - 5.20 MIL/uL   Hemoglobin 12.2 12.0 - 16.0 g/dL   HCT 38.0 35.0 - 47.0 %   MCV 77.3 (L) 80.0 - 100.0 fL   MCH 24.9 (L) 26.0 - 34.0 pg   MCHC 32.2 32.0 - 36.0 g/dL   RDW 15.5 (H) 11.5 - 14.5 %   Platelets 228 150 - 440 K/uL  Troponin I     Status: None   Collection Time: 08/15/17  7:10 AM  Result Value Ref Range   Troponin I <0.03 <0.03 ng/mL  CBC     Status: Abnormal   Collection Time: 08/15/17 11:21 AM  Result Value Ref Range   WBC 7.0 3.6 - 11.0 K/uL   RBC 5.05 3.80 - 5.20 MIL/uL   Hemoglobin 12.8 12.0 - 16.0 g/dL   HCT 37.9 35.0 - 47.0 %   MCV 75.1 (L) 80.0 - 100.0 fL   MCH 25.4 (L) 26.0 - 34.0 pg   MCHC 33.8 32.0 - 36.0 g/dL   RDW 15.7 (H) 11.5 - 14.5 %   Platelets 230 150 - 440 K/uL  Creatinine, serum     Status: Abnormal   Collection Time: 08/15/17 11:21 AM  Result Value Ref Range   Creatinine, Ser 1.02 (H) 0.44 - 1.00 mg/dL   GFR calc non Af Amer 53 (L) >60 mL/min   GFR calc Af Amer >60 >60 mL/min  Troponin I     Status: None    Collection Time: 08/15/17 11:21 AM  Result Value Ref Range   Troponin I <0.03 <0.03 ng/mL  Glucose, capillary     Status: Abnormal   Collection Time: 08/15/17 11:51 AM  Result Value Ref Range   Glucose-Capillary 116 (H) 65 - 99 mg/dL   Dg Chest 2 View  Result Date: 08/15/2017 CLINICAL DATA:  Central chest pain with generalized weakness beginning this morning. EXAM: CHEST - 2 VIEW COMPARISON:  08/06/2017 FINDINGS: The heart size and mediastinal contours are within normal limits. Both lungs are clear. The visualized skeletal structures are unremarkable. IMPRESSION: No active cardiopulmonary disease. Electronically Signed   By: Marin Olp M.D.   On: 08/15/2017 08:17     ASSESSMENT AND PLAN: Chest pain with EKG unchanged with LBBB, cardiac enzyme so far negative, add nitropaste and once r/o MI. Advise  f/u stress test in office as schedualed next week.  Valerio Pinard A

## 2017-08-15 NOTE — ED Provider Notes (Signed)
[facility] Emergency Department Provider Note  ___________________________________________   First MD Initiated Contact with Patient 08/15/17 [phone]     (approximate)  I have reviewed the triage vital signs and the nursing notes.   HISTORY  Chief Complaint Chest Pain   HPI Helen Mccormick is a 77 y.o. female with history of a left bundle branch block, anxiety, diabetes, hypertension and hyperlipidemia who is presenting to the emergency department with chest pain.  Says the chest pain is been waxing and waning since 7 AM this morning when awoke her from sleep.  She says the pain is a 7 out of 10 right now and radiating through to her back.  Says the pain is an aching type pain.  Has not taken her aspirin this morning.  Says the pain is coming and going but there are no exacerbating or alleviating factors.  Patient was seen in the emergency department on 10 May and then followed up with her primary care doctor and the cardiologist.  She is scheduled for a stress test this coming week.  However, the chest pain alarmed for this morning and she came to the emergency department for further evaluation.  Past Medical History:  Diagnosis Date  . Allergy   . Anxiety   . Asthma   . Colon polyp   . Depression   . Diabetes mellitus without complication ([place])   . GERD (gastroesophageal reflux disease)   . Heart murmur   . Hyperlipidemia   . Hypertension   . Sleep apnea   . Thyroid disease     Patient Active Problem List   Diagnosis Date Noted  . Fracture of distal end of radius 06/03/2017  . Hypertension 03/24/2017  . DM (diabetes mellitus), type 2 ([place]) 03/24/2017  . 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 Hyperlipidemia due to type 2 diabetes mellitus ([place]) 03/24/2017  . Cervical radiculitis 12/14/2013  . DDD (degenerative disc disease), cervical 12/14/2013  . Left shoulder pain 12/14/2013    Past Surgical History:  Procedure Laterality Date  .  ABDOMINAL HYSTERECTOMY    . BACK SURGERY    . BACK SURGERY    . CERVICAL FUSION    . COLONOSCOPY WITH PROPOFOL N/A 05/28/2017   Procedure: COLONOSCOPY WITH PROPOFOL;  Surgeon: Jonathon Bellows, MD;  Location: [facility] ENDOSCOPY;  Service: Gastroenterology;  Laterality: N/A;  . NECK SURGERY    . THYROID SURGERY    . THYROID SURGERY      Prior to Admission medications   Medication Sig Start Date End Date Taking? Authorizing Provider  albuterol (PROVENTIL HFA;VENTOLIN HFA) 108 (90 Base) MCG/ACT inhaler Inhale into the lungs every 6 (six) hours as needed for wheezing or shortness of breath.    [provider]  amLODipine (NORVASC) 10 MG tablet TAKE 1 TABLET BY MOUTH EVERY DAY 06/21/17   Lavera Guise, MD  aspirin 81 MG chewable tablet Chew 81 mg by mouth daily.     [provider]  atorvastatin (LIPITOR) 20 MG tablet Take 2 tablets (40 mg total) by mouth at bedtime. 07/06/17   [name], NP  baclofen (LIORESAL) 10 MG tablet Take 1 tablet (10 mg total) by mouth at bedtime as needed for muscle spasms. 08/13/17   [name], NP  cholecalciferol (VITAMIN D) 1000 units tablet Take 1 tablet by mouth daily.     [provider]  escitalopram (LEXAPRO) 10 MG tablet TAKE 1 TABLET BY MOUTH EVERY DAY FOR DEPRESSION 08/13/17   Merrilyn Puma,  Jerrel Ivory, NP  ferrous sulfate 325 (65 FE) MG tablet Take 325 mg by mouth daily with breakfast.    [provider]  fluticasone (FLONASE) 50 MCG/ACT nasal spray Place 2 sprays into both nostrils daily.    [provider]  glucose blood test strip Use 1 strip to check your blood sugar two times daily. 03/12/17   [name], NP  hydrochlorothiazide (HYDRODIURIL) 12.5 MG tablet Take 1 tablet (12.5 mg total) by mouth daily. 07/06/17   [name], NP  levothyroxine (SYNTHROID, LEVOTHROID) 75 MCG tablet Take 1 tablet (75 mcg total) by mouth daily before breakfast. 05/31/17   [name], NP    lubiprostone (AMITIZA) 8 MCG capsule Take 8 mcg by mouth 2 (two) times daily with a meal.    [provider]  naproxen (NAPROSYN) 500 MG tablet Take 1 tablet (500 mg total) by mouth 2 (two) times daily with a meal. 10/20/15   Hagler, Jami L, PA-C  Omega-3 1000 MG CAPS Take 1 capsule by mouth daily.     [provider]  omeprazole (PRILOSEC) 40 MG capsule Take 1 capsule (40 mg total) by mouth daily. 07/06/17   [name], NP    Allergies Amlodipine; Etodolac; and Tramadol  Family History  Problem Relation Age of Onset  . Breast cancer Maternal Aunt 60  . Diabetes Mother   . Leukemia Mother   . Diabetes Sister   . Mental illness Sister   . Colon polyps Sister   . Stroke Father   . Cancer Brother        rectal  . Healthy Daughter   . Hypertension Son   . Diabetes Sister   . Thyroid disease Sister   . Healthy Sister   . Healthy Sister   . Stroke Brother   . Healthy Son   . Healthy Son   . Healthy Daughter     Social History Social History   Tobacco Use  . Smoking status: Never Smoker  . Smokeless tobacco: Never Used  Substance Use Topics  . Alcohol use: No  . Drug use: No    Review of Systems  Constitutional: No fever/chills Eyes: No visual changes. ENT: No sore throat. Cardiovascular: As above Respiratory: Denies shortness of breath. Gastrointestinal: No abdominal pain.  No nausea, no vomiting.  No diarrhea.  No constipation. Genitourinary: Negative for dysuria. Musculoskeletal: Negative for back pain. Skin: Negative for rash. Neurological: Negative for headaches, focal weakness or numbness.   ____________________________________________   PHYSICAL EXAM:  VITAL SIGNS: ED Triage Vitals  Enc Vitals Group     BP 08/15/17 0708 (!) 142/62     Pulse Rate 08/15/17 0708 97     Resp 08/15/17 0708 16     Temp 08/15/17 0708 98.5 F (36.9 C)     Temp Source 08/15/17 0708 Oral     SpO2 08/15/17 0708 98 %     Weight 08/15/17 0709 163  lb (73.9 kg)     Height 08/15/17 0709 5\' 4"  (1.626 m)     Head Circumference --      Peak Flow --      Pain Score 08/15/17 0709 0     Pain Loc --      Pain Edu? --      Excl. in [place]? --     Constitutional: Alert and oriented. Well appearing and in no acute distress. Eyes: Conjunctivae are normal.  Head: Atraumatic. Nose: No congestion/rhinnorhea. Mouth/Throat: Mucous membranes are  moist.  Neck: No stridor.   Cardiovascular: Normal rate, regular rhythm. Grossly normal heart sounds.  Good peripheral circulation equal and bilateral radial as well as dorsalis pedis pulses.  Chest pain not reproducible to palpation.  However, there is tenderness palpation to the midline thoracic spine.  No deformity or step-off. Respiratory: Normal respiratory effort.  No retractions. Lungs CTAB. Gastrointestinal: Soft and nontender. No distention.  Musculoskeletal: No lower extremity tenderness nor edema.  No joint effusions. Neurologic:  Normal speech and language. No gross focal neurologic deficits are appreciated. Skin:  Skin is warm, dry and intact. No rash noted. Psychiatric: Mood and affect are normal. Speech and behavior are normal.  ____________________________________________   LABS (all labs ordered are listed, but only abnormal results are displayed)  Labs Reviewed  BASIC METABOLIC PANEL - Abnormal; Notable for the following components:      Result Value   Sodium 134 (*)    Potassium 3.4 (*)    Chloride 96 (*)    Glucose, Bld 119 (*)    BUN 36 (*)    Creatinine, Ser 1.10 (*)    GFR calc non Af Amer 48 (*)    GFR calc Af Amer 56 (*)    All other components within normal limits  CBC - Abnormal; Notable for the following components:   MCV 77.3 (*)    MCH 24.9 (*)    RDW 15.5 (*)    All other components within normal limits  TROPONIN I   ____________________________________________  EKG  ED ECG REPORT I, Doran Stabler, the attending physician, personally viewed and  interpreted this ECG.   Date: 08/15/2017  EKG Time: 0710  Rate: 91  Rhythm: normal sinus rhythm  Axis: Left axis  Intervals:right bundle branch block  ST&T Change: No ST segment elevation or depression.  No abnormal T wave inversion.  No significant change from previous.  Left bundle branch block is old.  ____________________________________________  RADIOLOGY  No acute finding on chest x-ray. ____________________________________________   PROCEDURES  Procedure(s) performed:   Procedures  Critical Care performed:   ____________________________________________   INITIAL IMPRESSION / ASSESSMENT AND PLAN / ED COURSE  Pertinent labs & imaging results that were available during my care of the patient were reviewed by me and considered in my medical decision making (see chart for details).  Differential diagnosis includes, but is not limited to, ACS, aortic dissection, pulmonary embolism, cardiac tamponade, pneumothorax, pneumonia, pericarditis, myocarditis, GI-related causes including esophagitis/gastritis, and musculoskeletal chest wall pain.   As part of my medical decision making, I reviewed the following data within the electronic MEDICAL RECORD NUMBER Notes from prior ED visits  ----------------------------------------- 9:29 AM on 08/15/2017 -----------------------------------------  Patient at this time says that her chest just feels "sore."  She is in no distress.  Because of her recent visit to the emergency department and that she has bouncing back and has multiple risk factors I will be admitting her to the hospital for further cardiac work-up and likely stress test.  She is understanding the plan willing to comply.  Signed out to Dr. Verdell Carmine. ____________________________________________   FINAL CLINICAL IMPRESSION(S) / ED DIAGNOSES  Chest pain.    NEW MEDICATIONS STARTED DURING THIS VISIT:  New Prescriptions   No medications on file     Note:  This document  was prepared using Dragon voice recognition software and may include unintentional dictation errors.     Orbie Pyo, MD 08/15/17 0930

## 2017-08-15 NOTE — ED Triage Notes (Signed)
Pt to ED via POV stating that she has is having chest pain that started this morning at 0700. Pt states that pain is located in her central chest, denies radiation of the pain. Pt states that she is also feeling dizzy. Pt seen last week for similar complaint, states that she has been hurting all over since then but not in her chest. Pt is in NAD at this time, able to speak in complete sentences.

## 2017-08-15 NOTE — ED Notes (Signed)
Pt verbalizes chest pain that woke her up this am  - describes pain as "stabbing from the back to the front"  7/10 on scale

## 2017-08-15 NOTE — Plan of Care (Signed)
  Problem: Education: Goal: Knowledge of General Education information will improve Outcome: Progressing   Problem: Health Behavior/Discharge Planning: Goal: Ability to manage health-related needs will improve Outcome: Progressing   Problem: Clinical Measurements: Goal: Will remain free from infection Outcome: Progressing   

## 2017-08-15 NOTE — H&P (Signed)
[place] [facility] [place] NAME: Helen Mccormick    MR#:  [id number]  DATE OF BIRTH:  January 02, 1941  DATE OF ADMISSION:  08/15/2017  PRIMARY CARE PHYSICIAN: [name], NP   REQUESTING/REFERRING PHYSICIAN:   CHIEF COMPLAINT:   Chief Complaint  Patient presents with  . Chest Pain    HISTORY OF PRESENT ILLNESS: Helen Mccormick  is a 77 y.o. female with a known history of diabetes mellitus type 2, GERD, hyperlipidemia, hypertension, sleep apnea, thyroid disease presented to the emergency room for chest pain.  Patient woke up this morning with left-sided chest pain.  The pain was sharp in nature and stabbing-like in sensation.  It was 7 out of 10 on a scale of 1-10.  Patient has recently seen her cardiologist in the clinic and was scheduled for a stress test in the next following week.  She sees Dr. Humphrey Rolls who is her cardiologist.  No complaints of any shortness of breath, orthopnea.  No headaches dizziness or blurry vision.  Troponin in the emergency room has been negative.  Hospitalist service was consulted for further care.  PAST MEDICAL HISTORY:   Past Medical History:  Diagnosis Date  . Allergy   . Anxiety   . Asthma   . Colon polyp   . Depression   . Diabetes mellitus without complication ([place])   . GERD (gastroesophageal reflux disease)   . Heart murmur   . Hyperlipidemia   . Hypertension   . Sleep apnea   . Thyroid disease     PAST SURGICAL HISTORY:  Past Surgical History:  Procedure Laterality Date  . ABDOMINAL HYSTERECTOMY    . BACK SURGERY    . BACK SURGERY    . CERVICAL FUSION    . COLONOSCOPY WITH PROPOFOL N/A 05/28/2017   Procedure: COLONOSCOPY WITH PROPOFOL;  Surgeon: Jonathon Bellows, MD;  Location: [facility] ENDOSCOPY;  Service: Gastroenterology;  Laterality: N/A;  . NECK SURGERY    . THYROID SURGERY    . THYROID SURGERY      SOCIAL HISTORY:  Social History   Tobacco Use  . Smoking status: Never Smoker  . Smokeless  tobacco: Never Used  Substance Use Topics  . Alcohol use: No    FAMILY HISTORY:  Family History  Problem Relation Age of Onset  . Breast cancer Maternal Aunt 60  . Diabetes Mother   . Leukemia Mother   . Diabetes Sister   . Mental illness Sister   . Colon polyps Sister   . Stroke Father   . Cancer Brother        rectal  . Healthy Daughter   . Hypertension Son   . Diabetes Sister   . Thyroid disease Sister   . Healthy Sister   . Healthy Sister   . Stroke Brother   . Healthy Son   . Healthy Son   . Healthy Daughter     DRUG ALLERGIES:  Allergies  Allergen Reactions  . Amlodipine Hives and Swelling  . Etodolac     Other reaction(s): Abdominal Pain, Other (See Comments)  . Tramadol Other (See Comments)    dizziness     REVIEW OF SYSTEMS:   CONSTITUTIONAL: No fever, fatigue or weakness.  EYES: No blurred or double vision.  EARS, NOSE, AND THROAT: No tinnitus or ear pain.  RESPIRATORY: No cough, shortness of breath, wheezing or hemoptysis.  CARDIOVASCULAR: Has chest pain, no orthopnea, edema.  GASTROINTESTINAL: No nausea, vomiting, diarrhea or abdominal  pain.  GENITOURINARY: No dysuria, hematuria.  ENDOCRINE: No polyuria, nocturia,  HEMATOLOGY: No anemia, easy bruising or bleeding SKIN: No rash or lesion. MUSCULOSKELETAL: No joint pain or arthritis.   NEUROLOGIC: No tingling, numbness, weakness.  PSYCHIATRY: No anxiety or depression.   MEDICATIONS AT HOME:  Prior to Admission medications   Medication Sig Start Date End Date Taking? Authorizing Provider  amLODipine (NORVASC) 10 MG tablet TAKE 1 TABLET BY MOUTH EVERY DAY 06/21/17  Yes Lavera Guise, MD  aspirin 81 MG chewable tablet Chew 81 mg by mouth daily.    Yes [provider]  atorvastatin (LIPITOR) 20 MG tablet Take 2 tablets (40 mg total) by mouth at bedtime. 07/06/17  Yes [name], NP  cholecalciferol (VITAMIN D) 1000 units tablet Take 1 tablet by mouth daily.    Yes [provider]  escitalopram (LEXAPRO) 10 MG tablet TAKE 1 TABLET BY MOUTH EVERY DAY FOR DEPRESSION 08/13/17  Yes [name], NP  ferrous sulfate 325 (65 FE) MG tablet Take 325 mg by mouth daily with breakfast.   Yes [provider]  fluticasone (FLONASE) 50 MCG/ACT nasal spray Place 2 sprays into both nostrils daily.   Yes [provider]  hydrochlorothiazide (HYDRODIURIL) 12.5 MG tablet Take 1 tablet (12.5 mg total) by mouth daily. 07/06/17  Yes [name], NP  levothyroxine (SYNTHROID, LEVOTHROID) 75 MCG tablet Take 1 tablet (75 mcg total) by mouth daily before breakfast. 05/31/17  Yes [name], NP  lubiprostone (AMITIZA) 8 MCG capsule Take 8 mcg by mouth daily with breakfast.    Yes [provider]  Omega-3 1000 MG CAPS Take 1 capsule by mouth daily.    Yes [provider]  omeprazole (PRILOSEC) 40 MG capsule Take 1 capsule (40 mg total) by mouth daily. 07/06/17  Yes [name], NP  albuterol (PROVENTIL HFA;VENTOLIN HFA) 108 (90 Base) MCG/ACT inhaler Inhale into the lungs every 6 (six) hours as needed for wheezing or shortness of breath.    [provider]  baclofen (LIORESAL) 10 MG tablet Take 1 tablet (10 mg total) by mouth at bedtime as needed for muscle spasms. 08/13/17   [name], NP  glucose blood test strip Use 1 strip to check your blood sugar two times daily. 03/12/17   [name], NP  naproxen (NAPROSYN) 500 MG tablet Take 1 tablet (500 mg total) by mouth 2 (two) times daily with a meal. 10/20/15   Hagler, Jami L, PA-C  tiZANidine (ZANAFLEX) 2 MG tablet Take 1 tablet by mouth 2 (two) times daily as needed for muscle spasms.  08/12/17   [provider]      PHYSICAL EXAMINATION:   VITAL SIGNS: Blood pressure 139/75, pulse 85, temperature 98.1 F (36.7 C), temperature source Oral, resp. rate 18, height 5\' 4"  (1.626 m), weight 73.9 kg (163 lb), SpO2 100 %.  GENERAL:  77  y.o.-year-old patient lying in the bed with no acute distress.  EYES: Pupils equal, round, reactive to light and accommodation. No scleral icterus. Extraocular muscles intact.  HEENT: Head atraumatic, normocephalic. Oropharynx and nasopharynx clear.  NECK:  Supple, no jugular venous distention. No thyroid enlargement, no tenderness.  LUNGS: Normal breath sounds bilaterally, no wheezing, rales,rhonchi or crepitation. No use of accessory muscles of respiration.  CARDIOVASCULAR: S1, S2 normal. No murmurs, rubs, or gallops.  ABDOMEN: Soft, nontender, nondistended. Bowel sounds present. No organomegaly or mass.  EXTREMITIES: No pedal edema, cyanosis, or clubbing.  NEUROLOGIC:  Cranial nerves II through XII are intact. Muscle strength 5/5 in all extremities. Sensation intact. Gait not checked.  PSYCHIATRIC: The patient is alert and oriented x 3.  SKIN: No obvious rash, lesion, or ulcer.   LABORATORY PANEL:   CBC Recent Labs  Lab 08/15/17 0710  WBC 8.9  HGB 12.2  HCT 38.0  PLT 228  MCV 77.3*  MCH 24.9*  MCHC 32.2  RDW 15.5*   ------------------------------------------------------------------------------------------------------------------  Chemistries  Recent Labs  Lab 08/15/17 0710  NA 134*  K 3.4*  CL 96*  CO2 26  GLUCOSE 119*  BUN 36*  CREATININE 1.10*  CALCIUM 9.1   ------------------------------------------------------------------------------------------------------------------ estimated creatinine clearance is 44.2 mL/min (A) (by C-G formula based on SCr of 1.1 mg/dL (H)). ------------------------------------------------------------------------------------------------------------------ No results for input(s): TSH, T4TOTAL, T3FREE, THYROIDAB in the last 72 hours.  Invalid input(s): FREET3   Coagulation profile No results for input(s): INR, PROTIME in the last 168  hours. ------------------------------------------------------------------------------------------------------------------- No results for input(s): DDIMER in the last 72 hours. -------------------------------------------------------------------------------------------------------------------  Cardiac Enzymes Recent Labs  Lab 08/15/17 0710  TROPONINI <0.03   ------------------------------------------------------------------------------------------------------------------ Invalid input(s): POCBNP  ---------------------------------------------------------------------------------------------------------------  Urinalysis    Component Value Date/Time   COLORURINE YELLOW (A) 05/31/2017 0607   APPEARANCEUR HAZY (A) 05/31/2017 0607   LABSPEC 1.020 05/31/2017 0607   PHURINE 5.0 05/31/2017 0607   GLUCOSEU NEGATIVE 05/31/2017 0607   HGBUR NEGATIVE 05/31/2017 0607   [place] NEGATIVE 05/31/2017 0607   KETONESUR NEGATIVE 05/31/2017 0607   PROTEINUR NEGATIVE 05/31/2017 0607   NITRITE NEGATIVE 05/31/2017 0607   LEUKOCYTESUR NEGATIVE 05/31/2017 0347     RADIOLOGY: Dg Chest 2 View  Result Date: 08/15/2017 CLINICAL DATA:  Central chest pain with generalized weakness beginning this morning. EXAM: CHEST - 2 VIEW COMPARISON:  08/06/2017 FINDINGS: The heart size and mediastinal contours are within normal limits. Both lungs are clear. The visualized skeletal structures are unremarkable. IMPRESSION: No active cardiopulmonary disease. Electronically Signed   By: Marin Olp M.D.   On: 08/15/2017 08:17    EKG: Orders placed or performed during the hospital encounter of 08/15/17  . ED EKG within 10 minutes  . ED EKG within 10 minutes  . EKG 12-Lead  . EKG 12-Lead    IMPRESSION AND PLAN: Helen Mccormick  is a 77 y.o. female with a known history of diabetes mellitus type 2, GERD, hyperlipidemia, hypertension, sleep apnea, thyroid disease presented to the emergency room for chest pain.  Patient  woke up this morning with left-sided chest pain.  -Chest pain Telemetry monitoring Cycle troponin Check echocardiogram Cardiology consultation Start patient on aspirin  -Diabetes mellitus type 2 Diabetic diet with sliding scale coverage with insulin  -DVT prophylaxis subcu Lovenox daily  -Hypertension Start patient on beta-blocker  -Hyperlipidemia Check lipid profile Resume statin medication All the records are reviewed and case discussed with ED provider. Management plans discussed with the patient, family and they are in agreement.  CODE STATUS:Full code    Code Status Orders  (From admission, onward)        Start     Ordered   08/15/17 1113  Full code  Continuous     08/15/17 1112    Code Status History    This patient has a current code status but no historical code status.       TOTAL TIME TAKING CARE OF THIS PATIENT: 52 minutes.    Saundra Shelling M.D on 08/15/2017 at 11:19 AM  Between 7am to 6pm - Pager - [phone]  After  6pm go to [email or web address] - password [email or web address] [facility]  Office  [phone]  CC: Primary care physician; [name], NP

## 2017-08-15 NOTE — Progress Notes (Signed)
Advanced care plan.  Purpose of the Encounter: CODE STATUS  Parties in Attendance:Patient  Patient's Decision Capacity:Good Subjective/Patient's story: Presented to the emergency room with chest pain Objective/Medical story Patient has history of hypertension, hyperlipidemia diabetes mellitus type 2 came for chest discomfort Needs cardiac work-up Goals of care determination:  Advanced directives discussed with the patient along with goals of  For now the patient wants everything done, that is cardiac resuscitation, intubation and ventilator if the need arises CODE STATUS: Full code Time spent discussing advanced care planning: 16 minutes

## 2017-08-16 ENCOUNTER — Telehealth: Payer: Self-pay

## 2017-08-16 DIAGNOSIS — R079 Chest pain, unspecified: Secondary | ICD-10-CM | POA: Diagnosis not present

## 2017-08-16 LAB — BASIC METABOLIC PANEL
Anion gap: 10 (ref 5–15)
BUN: 24 mg/dL — ABNORMAL HIGH (ref 6–20)
CO2: 30 mmol/L (ref 22–32)
Calcium: 9.5 mg/dL (ref 8.9–10.3)
Chloride: 95 mmol/L — ABNORMAL LOW (ref 101–111)
Creatinine, Ser: 1.04 mg/dL — ABNORMAL HIGH (ref 0.44–1.00)
GFR calc Af Amer: 60 mL/min — ABNORMAL LOW (ref >60)
GFR calc non Af Amer: 52 mL/min — ABNORMAL LOW (ref >60)
Glucose, Bld: 127 mg/dL — ABNORMAL HIGH (ref 65–99)
Potassium: 3.7 mmol/L (ref 3.5–5.1)
Sodium: 135 mmol/L (ref 135–145)

## 2017-08-16 LAB — LIPID PANEL
Cholesterol: 159 mg/dL (ref 0–200)
HDL: 32 mg/dL — ABNORMAL LOW (ref >40)
LDL Cholesterol: 104 mg/dL — ABNORMAL HIGH (ref 0–99)
Total CHOL/HDL Ratio: 5 RATIO
Triglycerides: 114 mg/dL (ref <150)
VLDL: 23 mg/dL (ref 0–40)

## 2017-08-16 LAB — CBC
HCT: 38.9 % (ref 35.0–47.0)
Hemoglobin: 12.9 g/dL (ref 12.0–16.0)
MCH: 25.8 pg — ABNORMAL LOW (ref 26.0–34.0)
MCHC: 33.1 g/dL (ref 32.0–36.0)
MCV: 77.9 fL — ABNORMAL LOW (ref 80.0–100.0)
Platelets: 235 10*3/uL (ref 150–440)
RBC: 5 MIL/uL (ref 3.80–5.20)
RDW: 15.6 % — ABNORMAL HIGH (ref 11.5–14.5)
WBC: 6.4 10*3/uL (ref 3.6–11.0)

## 2017-08-16 LAB — TROPONIN I: Troponin I: <0.03 ng/mL (ref <0.03)

## 2017-08-16 LAB — GLUCOSE, CAPILLARY: Glucose-Capillary: 118 mg/dL — ABNORMAL HIGH (ref 65–99)

## 2017-08-16 MED ORDER — ISOSORBIDE MONONITRATE ER 30 MG PO TB24: 30.0000 mg | ORAL_TABLET | Freq: Every day | ORAL | 0 refills | Status: DC

## 2017-08-16 NOTE — Plan of Care (Signed)
  Problem: Education: Goal: Knowledge of General Education information will improve Outcome: Progressing   Problem: Clinical Measurements: Goal: Will remain free from infection Outcome: Progressing   Problem: Pain Managment: Goal: General experience of comfort will improve Outcome: Progressing   Problem: Safety: Goal: Ability to remain free from injury will improve Outcome: Progressing   

## 2017-08-16 NOTE — Care Management Obs Status (Signed)
[place] NOTIFICATION   Patient Details  Name: Helen Mccormick MRN: [id number] Date of Birth: February 07, 1941   Medicare Observation Status Notification Given:  No Discharge order placed in < 24hr of being placed on observation    Katrina Stack, RN 08/16/2017, 9:27 AM

## 2017-08-16 NOTE — Discharge Summary (Signed)
[place] at [place] NAME: Helen Mccormick    MR#:  [id number]  DATE OF BIRTH:  [birth year]  DATE OF ADMISSION:  08/15/2017 ADMITTING PHYSICIAN: Saundra Shelling, MD  DATE OF DISCHARGE: 08/16/2017 11:28 AM  PRIMARY CARE PHYSICIAN: [name], NP   ADMISSION DIAGNOSIS:  Nonspecific chest pain [R07.9] Hyperlipidemia Hypertension Sleep apnea DISCHARGE DIAGNOSIS:  Atypical chest pain Hyperlipidemia  Hypertension Sleep apnea   SECONDARY DIAGNOSIS:   Past Medical History:  Diagnosis Date  . Allergy   . Anxiety   . Asthma   . Colon polyp   . Depression   . Diabetes mellitus without complication ([place])   . GERD (gastroesophageal reflux disease)   . Heart murmur   . Hyperlipidemia   . Hypertension   . Sleep apnea   . Thyroid disease      ADMITTING HISTORY Helen Mccormick  is a 77 y.o. female with a known history of diabetes mellitus type 2, GERD, hyperlipidemia, hypertension, sleep apnea, thyroid disease presented to the emergency room for chest pain.  Patient woke up this morning with left-sided chest pain.  The pain was sharp in nature and stabbing-like in sensation.  It was 7 out of 10 on a scale of 1-10.  Patient has recently seen her cardiologist in the clinic and was scheduled for a stress test in the next following week.  She sees Dr. Humphrey Rolls who is her cardiologist.  No complaints of any shortness of breath, orthopnea.  No headaches dizziness or blurry vision.  Troponin in the emergency room has been negative.  HOSPITAL COURSE:  Patient was admitted to telemetry.  Serial troponins were negative.  Patient received aspirin, statin and beta-blocker.  Cardiology consultation was done with Dr. Neoma Laming .  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  Patient has been scheduled for outpatient stress test on Friday.  Cardiology cleared the patient and patient will be discharged home with outpatient stress test.  Patient  hemodynamically stable.  CONSULTS OBTAINED:  Treatment Team:  Dionisio David, MD  DRUG ALLERGIES:   Allergies  Allergen Reactions  . Amlodipine Hives and Swelling  . Etodolac     Other reaction(s): Abdominal Pain, Other (See Comments)  . Tramadol Other (See Comments)    dizziness     DISCHARGE MEDICATIONS:   Allergies as of 08/16/2017      Reactions   Amlodipine Hives, Swelling   Etodolac    Other reaction(s): Abdominal Pain, Other (See Comments)   Tramadol Other (See Comments)   dizziness      Medication List    STOP taking these medications   naproxen 500 MG tablet Commonly known as:  NAPROSYN     TAKE these medications   albuterol 108 (90 Base) MCG/ACT inhaler Commonly known as:  PROVENTIL HFA;VENTOLIN HFA Inhale into the lungs every 6 (six) hours as needed for wheezing or shortness of breath.   amLODipine 10 MG tablet Commonly known as:  NORVASC TAKE 1 TABLET BY MOUTH EVERY DAY   aspirin 81 MG chewable tablet Chew 81 mg by mouth daily.   atorvastatin 20 MG tablet Commonly known as:  LIPITOR Take 2 tablets (40 mg total) by mouth at bedtime.   baclofen 10 MG tablet Commonly known as:  LIORESAL Take 1 tablet (10 mg total) by mouth at bedtime as needed for muscle spasms.   cholecalciferol 1000 units tablet Commonly known as:  VITAMIN D Take 1 tablet by mouth  daily.   escitalopram 10 MG tablet Commonly known as:  LEXAPRO TAKE 1 TABLET BY MOUTH EVERY DAY FOR DEPRESSION   ferrous sulfate 325 (65 FE) MG tablet Take 325 mg by mouth daily with breakfast.   fluticasone 50 MCG/ACT nasal spray Commonly known as:  FLONASE Place 2 sprays into both nostrils daily.   glucose blood test strip Use 1 strip to check your blood sugar two times daily.   hydrochlorothiazide 12.5 MG tablet Commonly known as:  HYDRODIURIL Take 1 tablet (12.5 mg total) by mouth daily.   isosorbide mononitrate 30 MG 24 hr tablet Commonly known as:  IMDUR Take 1 tablet (30 mg  total) by mouth daily.   levothyroxine 75 MCG tablet Commonly known as:  SYNTHROID, LEVOTHROID Take 1 tablet (75 mcg total) by mouth daily before breakfast.   lubiprostone 8 MCG capsule Commonly known as:  AMITIZA Take 8 mcg by mouth daily with breakfast.   Omega-3 1000 MG Caps Take 1 capsule by mouth daily.   omeprazole 40 MG capsule Commonly known as:  PRILOSEC Take 1 capsule (40 mg total) by mouth daily.   tiZANidine 2 MG tablet Commonly known as:  ZANAFLEX Take 1 tablet by mouth 2 (two) times daily as needed for muscle spasms.       Today  Patient seen and evaluated today  no chest pain No shortness of breath No palpitations  VITAL SIGNS:  Blood pressure 119/74, pulse 84, temperature 98.2 F (36.8 C), temperature source Oral, resp. rate 17, height 5\' 4"  (1.626 m), weight 71.7 kg (158 lb), SpO2 96 %.  I/O:    Intake/Output Summary (Last 24 hours) at 08/16/2017 1354 Last data filed at 08/16/2017 1043 Gross per 24 hour  Intake 513 ml  Output 2050 ml  Net -1537 ml    PHYSICAL EXAMINATION:  Physical Exam  GENERAL:  77 y.o.-year-old patient lying in the bed with no acute distress.  LUNGS: Normal breath sounds bilaterally, no wheezing, rales,rhonchi or crepitation. No use of accessory muscles of respiration.  CARDIOVASCULAR: S1, S2 normal. No murmurs, rubs, or gallops.  ABDOMEN: Soft, non-tender, non-distended. Bowel sounds present. No organomegaly or mass.  NEUROLOGIC: Moves all 4 extremities. PSYCHIATRIC: The patient is alert and oriented x 3.  SKIN: No obvious rash, lesion, or ulcer.   DATA REVIEW:   CBC Recent Labs  Lab 08/16/17 0528  WBC 6.4  HGB 12.9  HCT 38.9  PLT 235    Chemistries  Recent Labs  Lab 08/16/17 0528  NA 135  K 3.7  CL 95*  CO2 30  GLUCOSE 127*  BUN 24*  CREATININE 1.04*  CALCIUM 9.5    Cardiac Enzymes Recent Labs  Lab 08/16/17 0009  TROPONINI <0.03    Microbiology Results  No results found for this or any  previous visit.  RADIOLOGY:  Dg Chest 2 View  Result Date: 08/15/2017 CLINICAL DATA:  Central chest pain with generalized weakness beginning this morning. EXAM: CHEST - 2 VIEW COMPARISON:  08/06/2017 FINDINGS: The heart size and mediastinal contours are within normal limits. Both lungs are clear. The visualized skeletal structures are unremarkable. IMPRESSION: No active cardiopulmonary disease. Electronically Signed   By: Marin Olp M.D.   On: 08/15/2017 08:17    Follow up with PCP in 1 week.  Management plans discussed with the patient, family and they are in agreement.  CODE STATUS: Full code    Code Status Orders  (From admission, onward)        Start  Ordered   08/15/17 1113  Full code  Continuous     08/15/17 1112    Code Status History    This patient has a current code status but no historical code status.      TOTAL TIME TAKING CARE OF THIS PATIENT ON DAY OF DISCHARGE: more than 34 minutes.   Saundra Shelling M.D on 08/16/2017 at 1:54 PM  Between 7am to 6pm - Pager - [phone]  After 6pm go to [email or web address] - password [email or web address] [facility]  Office  [phone]  CC: Primary care physician; [name], NP  Note: This dictation was prepared with Dragon dictation along with smaller phrase technology. Any transcriptional errors that result from this process are unintentional.

## 2017-08-16 NOTE — Progress Notes (Signed)
Patient given discharge instructions with daughter at bedside. IV taken out and tele monitor taken off. Prescription given. Patient and family verbalized understanding with no further questions. Patient stable, wheeled down in wheelchair by nurse tech to family vehicle.

## 2017-08-16 NOTE — Progress Notes (Signed)
Patient has no complaints this am, ambulated from bed to chair with stand by assist. Chair alarm on, will continue to monitor patient.

## 2017-08-16 NOTE — Plan of Care (Signed)
  Problem: Education: Goal: Knowledge of General Education information will improve Outcome: Progressing   Problem: Health Behavior/Discharge Planning: Goal: Ability to manage health-related needs will improve Outcome: Progressing   Problem: Clinical Measurements: Goal: Will remain free from infection Outcome: Progressing Goal: Respiratory complications will improve Outcome: Progressing   Problem: Activity: Goal: Risk for activity intolerance will decrease Outcome: Progressing   Problem: Safety: Goal: Ability to remain free from injury will improve Outcome: Progressing   Problem: Skin Integrity: Goal: Risk for impaired skin integrity will decrease Outcome: Progressing

## 2017-08-16 NOTE — Telephone Encounter (Signed)
TOC #1. Called pt to f/u after d/c from [facility] on 08/16/17. Also wanted to confirm her hosp f/u appt w/ [facility] on 08/20/17 @ 1:40pm. Discharge planning includes the following:  - Begin Imdur - Discontinue Naproxen - Resume low Na+ diet - F/u with [facility] in 1 week - F/u with Dr. Humphrey Rolls in 1 week for OP stress test  As part of the [facility] f/u call, I am also wanting to discuss/review the above information with the pt to ensure all of the above have been taken care of and to ensure she doesn't have any questions or concerns re: her care/discharge instructions. LVM requesting returned call.

## 2017-08-16 NOTE — Progress Notes (Signed)
SUBJECTIVE: chest pain intermittently   Vitals:   08/15/17 1617 08/15/17 1936 08/16/17 0421 08/16/17 0722  BP: 115/65 (!) 117/59 106/75 119/74  Pulse: 82 87 83 84  Resp: 18 17 17    Temp: 98.7 F (37.1 C) 98.3 F (36.8 C) 98.4 F (36.9 C) 98.2 F (36.8 C)  TempSrc: Oral Oral Oral Oral  SpO2: 97% 97% 94% 96%  Weight:      Height:        Intake/Output Summary (Last 24 hours) at 08/16/2017 0321 Last data filed at 08/16/2017 0600 Gross per 24 hour  Intake 480 ml  Output 2050 ml  Net -1570 ml    LABS: Basic Metabolic Panel: Recent Labs    08/15/17 0710 08/15/17 1121 08/16/17 0528  NA 134*  --  135  K 3.4*  --  3.7  CL 96*  --  95*  CO2 26  --  30  GLUCOSE 119*  --  127*  BUN 36*  --  24*  CREATININE 1.10* 1.02* 1.04*  CALCIUM 9.1  --  9.5   Liver Function Tests: No results for input(s): AST, ALT, ALKPHOS, BILITOT, PROT, ALBUMIN in the last 72 hours. No results for input(s): LIPASE, AMYLASE in the last 72 hours. CBC: Recent Labs    08/15/17 1121 08/16/17 0528  WBC 7.0 6.4  HGB 12.8 12.9  HCT 37.9 38.9  MCV 75.1* 77.9*  PLT 230 235   Cardiac Enzymes: Recent Labs    08/15/17 1121 08/15/17 1700 08/16/17 0009  TROPONINI <0.03 <0.03 <0.03   BNP: Invalid input(s): POCBNP D-Dimer: No results for input(s): DDIMER in the last 72 hours. Hemoglobin A1C: No results for input(s): HGBA1C in the last 72 hours. Fasting Lipid Panel: Recent Labs    08/16/17 0528  CHOL 159  HDL 32*  LDLCALC 104*  TRIG 114  CHOLHDL 5.0   Thyroid Function Tests: No results for input(s): TSH, T4TOTAL, T3FREE, THYROIDAB in the last 72 hours.  Invalid input(s): FREET3 Anemia Panel: No results for input(s): VITAMINB12, FOLATE, FERRITIN, TIBC, IRON, RETICCTPCT in the last 72 hours.   PHYSICAL EXAM General: Well developed, well nourished, in no acute distress HEENT:  Normocephalic and atramatic Neck:  No JVD.  Lungs: Clear bilaterally to auscultation and percussion. Heart:  HRRR . Normal S1 and S2 without gallops or murmurs.  Abdomen: Bowel sounds are positive, abdomen soft and non-tender  Msk:  Back normal, normal gait. Normal strength and tone for age. Extremities: No clubbing, cyanosis or edema.   Neuro: Alert and oriented X 3. Psych:  Good affect, responds appropriately  TELEMETRY: sinus rhythm  ASSESSMENT AND PLAN: MI ruled out with atypical chest pain. Patient is already scheduled for a stress test Friday in my office and can be discharged with follow-up in the office on isosorbide 30 mg once a day.  Active Problems:   Chest pain    Helen Mccormick A, MD, [facility] 08/16/2017 8:08 AM

## 2017-08-17 ENCOUNTER — Telehealth: Payer: Self-pay

## 2017-08-17 NOTE — Telephone Encounter (Signed)
I have made the 2nd attempt to contact the patient or family member in charge, in order to follow up from recently being discharged from the hospital. I left a message on voicemail but I will make another attempt at a different time.  

## 2017-08-18 ENCOUNTER — Telehealth: Payer: Self-pay

## 2017-08-18 NOTE — Telephone Encounter (Signed)
Transition Care Management Follow-up Telephone Call   Date discharged?08/16/2017   How have you been since you were released from the hospital? "feeling okay"   Do you understand why you were in the hospital? yes   Do you understand the discharge instructions? yes   Where were you discharged to? Home    Items Reviewed:  Medications reviewed: yes  Allergies reviewed: yes  Dietary changes reviewed: yes  Referrals reviewed: yes   Functional Questionnaire:   Activities of Daily Living (ADLs):   She states they are independent in the following: ambulation, bathing and hygiene, feeding, continence, grooming, toileting and dressing States they require assistance with the following: none   Any transportation issues/concerns?: no   Any patient concerns? no   Confirmed importance and date/time of follow-up visits scheduled yes  Provider Appointment booked with Cassell Smiles on 08/20/2017 at 4pm   Confirmed with patient if condition begins to worsen call PCP or go to the ER.  Patient was given the office number and encouraged to call back with question or concerns.  : yes

## 2017-08-19 LAB — COLOGUARD: Cologuard: NEGATIVE

## 2017-08-20 ENCOUNTER — Inpatient Hospital Stay: Payer: Self-pay | Admitting: Nurse Practitioner

## 2017-08-20 ENCOUNTER — Encounter: Payer: Self-pay | Admitting: Nurse Practitioner

## 2017-08-20 ENCOUNTER — Ambulatory Visit (INDEPENDENT_AMBULATORY_CARE_PROVIDER_SITE_OTHER): Payer: Medicare Other | Admitting: Nurse Practitioner

## 2017-08-20 VITALS — BP 100/62 | HR 78 | Resp 15 | Ht 64.0 in | Wt 159.6 lb

## 2017-08-20 DIAGNOSIS — Z09 Encounter for follow-up examination after completed treatment for conditions other than malignant neoplasm: Secondary | ICD-10-CM | POA: Diagnosis not present

## 2017-08-20 DIAGNOSIS — I2089 Other forms of angina pectoris: Secondary | ICD-10-CM

## 2017-08-20 DIAGNOSIS — I10 Essential (primary) hypertension: Secondary | ICD-10-CM | POA: Diagnosis not present

## 2017-08-20 DIAGNOSIS — I952 Hypotension due to drugs: Secondary | ICD-10-CM | POA: Diagnosis not present

## 2017-08-20 DIAGNOSIS — I208 Other forms of angina pectoris: Secondary | ICD-10-CM | POA: Diagnosis not present

## 2017-08-20 MED ORDER — AMLODIPINE BESYLATE 5 MG PO TABS: 5.0000 mg | ORAL_TABLET | Freq: Every day | ORAL | 2 refills | Status: DC

## 2017-08-20 MED ORDER — NITROGLYCERIN 0.4 MG SL SUBL: 0.4000 mg | SUBLINGUAL_TABLET | SUBLINGUAL | 3 refills | Status: DC | PRN

## 2017-08-20 NOTE — Progress Notes (Signed)
Subjective:    Patient ID: Helen Mccormick, female    DOB: Jan 26, 1941, 77 y.o.   MRN: [id number]  Milly Goggins Kimbrell is a 77 y.o. female presenting on 08/20/2017 for Hospitalization Follow-up   HPI  HOSPITAL FOLLOW-UP VISIT  Hospital/Location: [facility]  Date of Admission: 08/15/2017 Date of Discharge: 08/16/2017 (Observation) Transitions of care telephone call: Tiffany 08/18/2017  Reason for Admission: Chest pain  Primary (+Secondary) Diagnosis: atypical chest pain  FOLLOW-UP chest pain  - Hospital H&P and Discharge Summary have been reviewed - Patient presents today 4 days after recent hospitalization. Brief summary of recent course, patient had symptoms of chest pain recurrent over last several weeks.  It recurred and patient sought care at ER.  She was admitted for observation, treated with nitroglycerin, oxygen, and had negative cardiac workup.  She already had cardiac workup scheduled as outpatient with [facility] that had not yet been completed after last ED visit for chest pain.  - Today reports overall has done well after discharge. Symptoms of chest pain have resolved, but she is having more fatigue and tiredness since starting isosorbide mononitrate. Patient reports she has not gotten any sublingual nitroglycerin to date to take when she has symptoms of chest pain.  - New medications on discharge: isosorbide mononitrate - Changes to current meds on discharge: none - outpatient stress test scheduled for Friday  Hypotension Yesterday, patient reports feeling lightheaded, weak, dizzy, and with blurry vision.  She lie down and rested symptoms improved.  She continues to feel very tired after starting her isosorbide mononitrate.  She denies syncopal events.  Social History   Tobacco Use  . Smoking status: Never Smoker  . Smokeless tobacco: Never Used  Substance Use Topics  . Alcohol use: No  . Drug use: No    Review of Systems Per HPI unless specifically indicated  above  Outpatient Encounter Medications as of 08/20/2017  Medication Sig  . albuterol (PROVENTIL HFA;VENTOLIN HFA) 108 (90 Base) MCG/ACT inhaler Inhale into the lungs every 6 (six) hours as needed for wheezing or shortness of breath.  Marland Kitchen amLODipine (NORVASC) 10 MG tablet TAKE 1 TABLET BY MOUTH EVERY DAY  . aspirin 81 MG chewable tablet Chew 81 mg by mouth daily.   Marland Kitchen atorvastatin (LIPITOR) 20 MG tablet Take 2 tablets (40 mg total) by mouth at bedtime.  . baclofen (LIORESAL) 10 MG tablet Take 1 tablet (10 mg total) by mouth at bedtime as needed for muscle spasms.  . cholecalciferol (VITAMIN D) 1000 units tablet Take 1 tablet by mouth daily.   Marland Kitchen escitalopram (LEXAPRO) 10 MG tablet TAKE 1 TABLET BY MOUTH EVERY DAY FOR DEPRESSION  . ferrous sulfate 325 (65 FE) MG tablet Take 325 mg by mouth daily with breakfast.  . fluticasone (FLONASE) 50 MCG/ACT nasal spray Place 2 sprays into both nostrils daily.  Marland Kitchen glucose blood test strip Use 1 strip to check your blood sugar two times daily.  . hydrochlorothiazide (HYDRODIURIL) 12.5 MG tablet Take 1 tablet (12.5 mg total) by mouth daily.  . isosorbide mononitrate (IMDUR) 30 MG 24 hr tablet Take 1 tablet (30 mg total) by mouth daily.  Marland Kitchen levothyroxine (SYNTHROID, LEVOTHROID) 75 MCG tablet Take 1 tablet (75 mcg total) by mouth daily before breakfast.  . lubiprostone (AMITIZA) 8 MCG capsule Take 8 mcg by mouth daily with breakfast.   . Omega-3 1000 MG CAPS Take 1 capsule by mouth daily.   Marland Kitchen omeprazole (PRILOSEC) 40 MG capsule Take 1 capsule (40 mg total)  by mouth daily.  Marland Kitchen tiZANidine (ZANAFLEX) 2 MG tablet Take 1 tablet by mouth 2 (two) times daily as needed for muscle spasms.    No facility-administered encounter medications on file as of 08/20/2017.        Objective:    BP 100/62 (BP Location: Left Arm, Patient Position: Sitting, Cuff Size: Normal)   Pulse 78   Resp 15   Ht 5\' 4"  (1.626 m)   Wt 159 lb 9.6 oz (72.4 kg)   BMI 27.40 kg/m   Wt Readings  from Last 3 Encounters:  08/20/17 159 lb 9.6 oz (72.4 kg)  08/15/17 158 lb (71.7 kg)  08/13/17 163 lb (73.9 kg)    Physical Exam  Constitutional: She is oriented to person, place, and time. She appears well-developed and well-nourished. No distress.  HENT:  Head: Normocephalic and atraumatic.  Neck: Normal range of motion. Neck supple. Carotid bruit is not present.  Cardiovascular: Normal rate, regular rhythm, S1 normal, S2 normal, normal heart sounds and intact distal pulses.  Pulmonary/Chest: Effort normal and breath sounds normal. No respiratory distress.  Musculoskeletal: She exhibits no edema (pedal).  Neurological: She is alert and oriented to person, place, and time.  Skin: Skin is warm and dry. Capillary refill takes less than 2 seconds.  Psychiatric: She has a normal mood and affect. Her behavior is normal. Judgment and thought content normal.  Vitals reviewed.   Results for orders placed or performed during the hospital encounter of 10/93/23  Basic metabolic panel  Result Value Ref Range   Sodium 134 (L) 135 - 145 mmol/L   Potassium 3.4 (L) 3.5 - 5.1 mmol/L   Chloride 96 (L) 101 - 111 mmol/L   CO2 26 22 - 32 mmol/L   Glucose, Bld 119 (H) 65 - 99 mg/dL   BUN 36 (H) 6 - 20 mg/dL   Creatinine, Ser 1.10 (H) 0.44 - 1.00 mg/dL   Calcium 9.1 8.9 - 10.3 mg/dL   GFR calc non Af Amer 48 (L) >60 mL/min   GFR calc Af Amer 56 (L) >60 mL/min   Anion gap 12 5 - 15  CBC  Result Value Ref Range   WBC 8.9 3.6 - 11.0 K/uL   RBC 4.92 3.80 - 5.20 MIL/uL   Hemoglobin 12.2 12.0 - 16.0 g/dL   HCT 38.0 35.0 - 47.0 %   MCV 77.3 (L) 80.0 - 100.0 fL   MCH 24.9 (L) 26.0 - 34.0 pg   MCHC 32.2 32.0 - 36.0 g/dL   RDW 15.5 (H) 11.5 - 14.5 %   Platelets 228 150 - 440 K/uL  Troponin I  Result Value Ref Range   Troponin I <0.03 <0.03 ng/mL  CBC  Result Value Ref Range   WBC 7.0 3.6 - 11.0 K/uL   RBC 5.05 3.80 - 5.20 MIL/uL   Hemoglobin 12.8 12.0 - 16.0 g/dL   HCT 37.9 35.0 - 47.0 %   MCV  75.1 (L) 80.0 - 100.0 fL   MCH 25.4 (L) 26.0 - 34.0 pg   MCHC 33.8 32.0 - 36.0 g/dL   RDW 15.7 (H) 11.5 - 14.5 %   Platelets 230 150 - 440 K/uL  Creatinine, serum  Result Value Ref Range   Creatinine, Ser 1.02 (H) 0.44 - 1.00 mg/dL   GFR calc non Af Amer 53 (L) >60 mL/min   GFR calc Af Amer >60 >60 mL/min  Troponin I  Result Value Ref Range   Troponin I <0.03 <0.03 ng/mL  Troponin  I  Result Value Ref Range   Troponin I <0.03 <0.03 ng/mL  Troponin I  Result Value Ref Range   Troponin I <0.03 <0.03 ng/mL  Glucose, capillary  Result Value Ref Range   Glucose-Capillary 116 (H) 65 - 99 mg/dL  Glucose, capillary  Result Value Ref Range   Glucose-Capillary 92 65 - 99 mg/dL  Basic metabolic panel  Result Value Ref Range   Sodium 135 135 - 145 mmol/L   Potassium 3.7 3.5 - 5.1 mmol/L   Chloride 95 (L) 101 - 111 mmol/L   CO2 30 22 - 32 mmol/L   Glucose, Bld 127 (H) 65 - 99 mg/dL   BUN 24 (H) 6 - 20 mg/dL   Creatinine, Ser 1.04 (H) 0.44 - 1.00 mg/dL   Calcium 9.5 8.9 - 10.3 mg/dL   GFR calc non Af Amer 52 (L) >60 mL/min   GFR calc Af Amer 60 (L) >60 mL/min   Anion gap 10 5 - 15  CBC  Result Value Ref Range   WBC 6.4 3.6 - 11.0 K/uL   RBC 5.00 3.80 - 5.20 MIL/uL   Hemoglobin 12.9 12.0 - 16.0 g/dL   HCT 38.9 35.0 - 47.0 %   MCV 77.9 (L) 80.0 - 100.0 fL   MCH 25.8 (L) 26.0 - 34.0 pg   MCHC 33.1 32.0 - 36.0 g/dL   RDW 15.6 (H) 11.5 - 14.5 %   Platelets 235 150 - 440 K/uL  Lipid panel  Result Value Ref Range   Cholesterol 159 0 - 200 mg/dL   Triglycerides 114 <150 mg/dL   HDL 32 (L) >40 mg/dL   Total CHOL/HDL Ratio 5.0 RATIO   VLDL 23 0 - 40 mg/dL   LDL Cholesterol 104 (H) 0 - 99 mg/dL  Glucose, capillary  Result Value Ref Range   Glucose-Capillary 151 (H) 65 - 99 mg/dL  Glucose, capillary  Result Value Ref Range   Glucose-Capillary 118 (H) 65 - 99 mg/dL      Assessment & Plan:   Problem List Items Addressed This Visit      Cardiovascular and Mediastinum    Hypertension   Relevant Medications   nitroGLYCERIN (NITROSTAT) 0.4 MG SL tablet   amLODipine (NORVASC) 5 MG tablet   Angina at rest [facility])   Relevant Medications   nitroGLYCERIN (NITROSTAT) 0.4 MG SL tablet   amLODipine (NORVASC) 5 MG tablet    Other Visit Diagnoses    Hospital discharge follow-up    -  Primary   Hypotension due to drugs       Relevant Medications   nitroGLYCERIN (NITROSTAT) 0.4 MG SL tablet   amLODipine (NORVASC) 5 MG tablet     Stable.  Patient with new hypotension after initiation of Imdur.  No syncope and no new chest pain since discharge.  However, patient not provided with SL nitroglycerin.  Patient is hypotensive on exam. - START nitroglycerin SL 0.4 mg tab q 5 min x 3 for chest pain.  If no resolution after 3 doses, call 9-1-1 for help. - Continue with stress test per Dr. Humphrey Rolls and complete cardiac workup - Reduce amlodipine to 5 mg daily for new hypotension after Imdur. - Follow-up with Dr. Humphrey Rolls and in clinic in about 6 weeks as scheduled.   Meds ordered this encounter  Medications  . nitroGLYCERIN (NITROSTAT) 0.4 MG SL tablet    Sig: Place 1 tablet (0.4 mg total) under the tongue every 5 (five) minutes as needed for chest pain.  Dispense:  50 tablet    Refill:  3    Order Specific Question:   Supervising Provider    Answer:   Olin Hauser [[id number]]  . amLODipine (NORVASC) 5 MG tablet    Sig: Take 1 tablet (5 mg total) by mouth daily.    Dispense:  30 tablet    Refill:  2    Order Specific Question:   Supervising Provider    Answer:   Olin Hauser [[id number]]   I have reviewed the admission H&P, hospital notes, discharge summary, discharge medication list, and have reconciled the current and discharge medications today.  Follow up plan: Return for as scheduled.   Cassell Smiles, DNP, AGPCNP-BC Adult Gerontology Primary Care Nurse Practitioner [facility] 08/20/2017, 4:10 PM

## 2017-08-20 NOTE — Patient Instructions (Addendum)
Burnard Leigh Eichelberger "[name]",   Thank you for coming in to clinic today.  1. START nitroglycerin 0.4 mg tablet as needed for chest pain. Place this under your tongue if you have chest pain.  Wait 5 minutes.  If it does not improve, repeat.  Wait another 5 minutes. You can repeat a 3rd time.  If third dose does not help, call 9-1-1.   2. Reduce amlodipine to 5 mg once daily. - If BP is less than 95/60, call clinic or Dr. Humphrey Rolls.     3. Keep Dr. Humphrey Rolls appointment for more instructions.  Let him know I've started nitroglycerin.  Please schedule a follow-up appointment with Cassell Smiles, AGNP. Return as scheduled.  If you have any other questions or concerns, please feel free to call the clinic or send a message through [facility]. You may also schedule an earlier appointment if necessary.  You will receive a survey after today's visit either digitally by e-mail or paper by [employer]. Your experiences and feedback matter to [name].  Please respond so we know how we are doing as we provide care for you.   Cassell Smiles, DNP, AGNP-BC Adult Gerontology Nurse Practitioner [facility]

## 2017-08-27 ENCOUNTER — Emergency Department
Admission: EM | Admit: 2017-08-27 | Discharge: 2017-08-27 | Disposition: A | Discharge status: Home / Self Care | Payer: Medicare Other | Attending: Emergency Medicine | Admitting: Emergency Medicine

## 2017-08-27 ENCOUNTER — Other Ambulatory Visit: Payer: Self-pay

## 2017-08-27 ENCOUNTER — Encounter: Payer: Self-pay | Admitting: *Deleted

## 2017-08-27 DIAGNOSIS — J45909 Unspecified asthma, uncomplicated: Secondary | ICD-10-CM | POA: Insufficient documentation

## 2017-08-27 DIAGNOSIS — R002 Palpitations: Secondary | ICD-10-CM | POA: Insufficient documentation

## 2017-08-27 DIAGNOSIS — I10 Essential (primary) hypertension: Secondary | ICD-10-CM | POA: Diagnosis not present

## 2017-08-27 DIAGNOSIS — Z7982 Long term (current) use of aspirin: Secondary | ICD-10-CM | POA: Diagnosis not present

## 2017-08-27 DIAGNOSIS — R42 Dizziness and giddiness: Secondary | ICD-10-CM | POA: Diagnosis present

## 2017-08-27 DIAGNOSIS — R070 Pain in throat: Secondary | ICD-10-CM | POA: Insufficient documentation

## 2017-08-27 DIAGNOSIS — E119 Type 2 diabetes mellitus without complications: Secondary | ICD-10-CM | POA: Diagnosis not present

## 2017-08-27 DIAGNOSIS — E039 Hypothyroidism, unspecified: Secondary | ICD-10-CM | POA: Insufficient documentation

## 2017-08-27 DIAGNOSIS — Z79899 Other long term (current) drug therapy: Secondary | ICD-10-CM | POA: Insufficient documentation

## 2017-08-27 LAB — CBC WITH DIFFERENTIAL/PLATELET
Basophils Absolute: 0 10*3/uL (ref 0–0.1)
Basophils Relative: 1 %
Eosinophils Absolute: 0.2 10*3/uL (ref 0–0.7)
Eosinophils Relative: 3 %
HCT: 33.6 % — ABNORMAL LOW (ref 35.0–47.0)
Hemoglobin: 11 g/dL — ABNORMAL LOW (ref 12.0–16.0)
Lymphocytes Relative: 40 %
Lymphs Abs: 2.2 10*3/uL (ref 1.0–3.6)
MCH: 25.7 pg — ABNORMAL LOW (ref 26.0–34.0)
MCHC: 32.7 g/dL (ref 32.0–36.0)
MCV: 78.6 fL — ABNORMAL LOW (ref 80.0–100.0)
Monocytes Absolute: 0.5 10*3/uL (ref 0.2–0.9)
Monocytes Relative: 8 %
Neutro Abs: 2.7 10*3/uL (ref 1.4–6.5)
Neutrophils Relative %: 48 %
Platelets: 234 10*3/uL (ref 150–440)
RBC: 4.27 MIL/uL (ref 3.80–5.20)
RDW: 15.2 % — ABNORMAL HIGH (ref 11.5–14.5)
WBC: 5.5 10*3/uL (ref 3.6–11.0)

## 2017-08-27 LAB — BASIC METABOLIC PANEL
Anion gap: 9 (ref 5–15)
BUN: 26 mg/dL — ABNORMAL HIGH (ref 6–20)
CO2: 25 mmol/L (ref 22–32)
Calcium: 9.3 mg/dL (ref 8.9–10.3)
Chloride: 106 mmol/L (ref 101–111)
Creatinine, Ser: 1.08 mg/dL — ABNORMAL HIGH (ref 0.44–1.00)
GFR calc Af Amer: 57 mL/min — ABNORMAL LOW (ref >60)
GFR calc non Af Amer: 49 mL/min — ABNORMAL LOW (ref >60)
Glucose, Bld: 93 mg/dL (ref 65–99)
Potassium: 3.6 mmol/L (ref 3.5–5.1)
Sodium: 140 mmol/L (ref 135–145)

## 2017-08-27 LAB — TROPONIN I: Troponin I: <0.03 ng/mL (ref <0.03)

## 2017-08-27 MED ORDER — SODIUM CHLORIDE 0.9 % IV BOLUS
500.0000 mL | Freq: Once | INTRAVENOUS | Status: AC
  Administered 2017-08-27: 500 mL via INTRAVENOUS

## 2017-08-27 NOTE — ED Provider Notes (Signed)
[facility] Emergency Department Provider Note ____________________________________________   First MD Initiated Contact with Patient 08/27/17 4193     (approximate)  I have reviewed the triage vital signs and the nursing notes.   HISTORY  Chief Complaint Dizziness    HPI Helen Mccormick is a 77 y.o. female with PMH as noted below who presents with dizziness, acute onset when she awoke from nap today, described as lightheaded, associated with palpitations and with tightness in her throat, and now resolved.  The patient states that she did not eat much this morning.  She went to her doctor to get the results of an MRI she had several days ago, and then when she returned home she took her usual morning medicines for entheses although several hours later than she normally would take them) and took a nap.  She states that she is now feeling much better.   Past Medical History:  Diagnosis Date  . Allergy   . Anxiety   . Asthma   . Colon polyp   . Depression   . Diabetes mellitus without complication ([place])   . GERD (gastroesophageal reflux disease)   . Heart murmur   . Hyperlipidemia   . Hypertension   . Sleep apnea   . Thyroid disease     Patient Active Problem List   Diagnosis Date Noted  . Chest pain 08/15/2017  . Fracture of distal end of radius 06/03/2017  . Hypertension 03/24/2017  . DM (diabetes mellitus), type 2 ([place]) 03/24/2017  . 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 Hyperlipidemia due to type 2 diabetes mellitus ([place]) 03/24/2017  . Cervical radiculitis 12/14/2013  . DDD (degenerative disc disease), cervical 12/14/2013  . Left shoulder pain 12/14/2013    Past Surgical History:  Procedure Laterality Date  . ABDOMINAL HYSTERECTOMY    . BACK SURGERY    . BACK SURGERY    . CERVICAL FUSION    . COLONOSCOPY WITH PROPOFOL N/A 05/28/2017   Procedure: COLONOSCOPY WITH PROPOFOL;  Surgeon: Jonathon Bellows, MD;  Location: [facility] ENDOSCOPY;   Service: Gastroenterology;  Laterality: N/A;  . NECK SURGERY    . THYROID SURGERY    . THYROID SURGERY      Prior to Admission medications   Medication Sig Start Date End Date Taking? Authorizing Provider  albuterol (PROVENTIL HFA;VENTOLIN HFA) 108 (90 Base) MCG/ACT inhaler Inhale into the lungs every 6 (six) hours as needed for wheezing or shortness of breath.    [provider]  amLODipine (NORVASC) 5 MG tablet Take 1 tablet (5 mg total) by mouth daily. 08/20/17   [name], NP  aspirin 81 MG chewable tablet Chew 81 mg by mouth daily.     [provider]  atorvastatin (LIPITOR) 20 MG tablet Take 2 tablets (40 mg total) by mouth at bedtime. 07/06/17   [name], NP  baclofen (LIORESAL) 10 MG tablet Take 1 tablet (10 mg total) by mouth at bedtime as needed for muscle spasms. 08/13/17   [name], NP  cholecalciferol (VITAMIN D) 1000 units tablet Take 1 tablet by mouth daily.     [provider]  escitalopram (LEXAPRO) 10 MG tablet TAKE 1 TABLET BY MOUTH EVERY DAY FOR DEPRESSION 08/13/17   [name], NP  ferrous sulfate 325 (65 FE) MG tablet Take 325 mg by mouth daily with breakfast.    [provider]  fluticasone (FLONASE) 50 MCG/ACT nasal spray Place 2 sprays into both nostrils daily.  [provider]  glucose blood test strip Use 1 strip to check your blood sugar two times daily. 03/12/17   [name], NP  hydrochlorothiazide (HYDRODIURIL) 12.5 MG tablet Take 1 tablet (12.5 mg total) by mouth daily. 07/06/17   [name], NP  isosorbide mononitrate (IMDUR) 30 MG 24 hr tablet Take 1 tablet (30 mg total) by mouth daily. 08/16/17 09/15/17  Saundra Shelling, MD  levothyroxine (SYNTHROID, LEVOTHROID) 75 MCG tablet Take 1 tablet (75 mcg total) by mouth daily before breakfast. 05/31/17   [name], NP  lubiprostone (AMITIZA) 8 MCG capsule Take 8 mcg by mouth daily with breakfast.      [provider]  nitroGLYCERIN (NITROSTAT) 0.4 MG SL tablet Place 1 tablet (0.4 mg total) under the tongue every 5 (five) minutes as needed for chest pain. 08/20/17   [name], NP  Omega-3 1000 MG CAPS Take 1 capsule by mouth daily.     [provider]  omeprazole (PRILOSEC) 40 MG capsule Take 1 capsule (40 mg total) by mouth daily. 07/06/17   [name], NP  tiZANidine (ZANAFLEX) 2 MG tablet Take 1 tablet by mouth 2 (two) times daily as needed for muscle spasms.  08/12/17   [provider]    Allergies Amlodipine; Etodolac; and Tramadol  Family History  Problem Relation Age of Onset  . Breast cancer Maternal Aunt 60  . Diabetes Mother   . Leukemia Mother   . Diabetes Sister   . Mental illness Sister   . Colon polyps Sister   . Stroke Father   . Cancer Brother        rectal  . Healthy Daughter   . Hypertension Son   . Diabetes Sister   . Thyroid disease Sister   . Healthy Sister   . Healthy Sister   . Stroke Brother   . Healthy Son   . Healthy Son   . Healthy Daughter     Social History Social History   Tobacco Use  . Smoking status: Never Smoker  . Smokeless tobacco: Never Used  Substance Use Topics  . Alcohol use: No  . Drug use: No    Review of Systems  Constitutional: No fever Eyes: No redness. ENT: No sore throat. Cardiovascular: Denies chest pain.  Positive for palpitations. Respiratory: Denies shortness of breath. Gastrointestinal: No vomiting. Genitourinary: Negative for flank pain.  Musculoskeletal: Negative for back pain. Skin: Negative for rash. Neurological: Negative for headache.   ____________________________________________   PHYSICAL EXAM:  VITAL SIGNS: ED Triage Vitals  Enc Vitals Group     BP 08/27/17 1618 138/69     Pulse Rate 08/27/17 1618 75     Resp --      Temp 08/27/17 1618 98.4 F (36.9 C)     Temp Source 08/27/17 1618 Oral     SpO2 08/27/17 1618 96 %     Weight 08/27/17  1620 159 lb (72.1 kg)     Height 08/27/17 1620 5\' 4"  (1.626 m)     Head Circumference --      Peak Flow --      Pain Score 08/27/17 1619 0     Pain Loc --      Pain Edu? --      Excl. in [place]? --     Constitutional: Alert and oriented. Well appearing and in no acute distress. Eyes: Conjunctivae are normal.  EOMI.  PERRLA. Head: Atraumatic. Nose: No congestion/rhinnorhea. Mouth/Throat: Mucous membranes are  slightly dry.   Neck: Normal range of motion.  Cardiovascular: Normal rate, regular rhythm. Grossly normal heart sounds.  Good peripheral circulation. Respiratory: Normal respiratory effort.  No retractions. Lungs CTAB. Gastrointestinal: Soft and nontender. No distention.  Genitourinary: No flank tenderness. Musculoskeletal: No lower extremity edema.  Extremities warm and well perfused.  Neurologic:  Normal speech and language.  Normal coordination.  Motor intact in all extremities.  No gross focal neurologic deficits are appreciated.  Skin:  Skin is warm and dry. No rash noted. Psychiatric: Mood and affect are normal. Speech and behavior are normal.  ____________________________________________   LABS (all labs ordered are listed, but only abnormal results are displayed)  Labs Reviewed  BASIC METABOLIC PANEL - Abnormal; Notable for the following components:      Result Value   BUN 26 (*)    Creatinine, Ser 1.08 (*)    GFR calc non Af Amer 49 (*)    GFR calc Af Amer 57 (*)    All other components within normal limits  CBC WITH DIFFERENTIAL/PLATELET - Abnormal; Notable for the following components:   Hemoglobin 11.0 (*)    HCT 33.6 (*)    MCV 78.6 (*)    MCH 25.7 (*)    RDW 15.2 (*)    All other components within normal limits  TROPONIN I   ____________________________________________  EKG  ED ECG REPORT I, Arta Silence, the attending physician, personally viewed and interpreted this ECG.  Date: 08/27/2017 EKG Time: 1618 Rate: 66 Rhythm: normal sinus  rhythm QRS Axis: normal Intervals: LBBB ST/T Wave abnormalities: normal Narrative Interpretation: no evidence of acute ischemia; no significant change when compared to EKG of 08/15/2017  ____________________________________________  RADIOLOGY    ____________________________________________   PROCEDURES  Procedure(s) performed: No  Procedures  Critical Care performed: No ____________________________________________   INITIAL IMPRESSION / ASSESSMENT AND PLAN / ED COURSE  Pertinent labs & imaging results that were available during my care of the patient were reviewed by me and considered in my medical decision making (see chart for details).  77 year old female with PMH as noted above presents with dizziness and palpitations acute onset after he woke from a nap, and now resolved.  Patient states she did not eat much this morning and took her medications later than usual after going to a doctor's appointment earlier today.  I reviewed the past medical records in Epic; the patient was admitted approximately 10 days ago for chest pain, with negative work-up.  Patient states that today symptoms were not really similar.  She denies chest pain currently.  On exam, the patient is extremely well-appearing, vital signs are normal, and the remainder of the exam is unremarkable.  EKG is unchanged from prior.  Overall suspect most likely mild dehydration, vasovagal episode, side effects of her blood pressure medications given that she took them not at the usual time, or other benign cause.  Plan: Basic labs, troponin x1, and reassess.  ----------------------------------------- 6:43 PM on 08/27/2017 -----------------------------------------  Patient is feeling much better after fluids, and her lab work-up is unremarkable with no significant changes from baseline.  She feels well and would like to go home.  Return precautions given, and she expresses understanding.  She agrees to follow-up  with her regular doctor.  ____________________________________________   FINAL CLINICAL IMPRESSION(S) / ED DIAGNOSES  Final diagnoses:  Dizziness      NEW MEDICATIONS STARTED DURING THIS VISIT:  New Prescriptions   No medications on file     Note:  This document was prepared using Dragon voice recognition software and may include unintentional dictation errors.    Arta Silence, MD 08/27/17 [phone]

## 2017-08-27 NOTE — ED Notes (Signed)
Pt DC by previous nurse. This RN not able to get a new set of vitals.

## 2017-08-27 NOTE — Discharge Instructions (Addendum)
Follow-up with your regular doctor.  Return to the ER for new, worsening, or persistent dizziness, chest or throat pain, difficulty breathing, or any other new or worsening symptoms that concern you.

## 2017-08-27 NOTE — ED Triage Notes (Signed)
Present via EMS. Pt went to cardiologist this am and came home and took a nap when she woke with throat tightening, dizziness and diaphoretic.

## 2017-08-30 ENCOUNTER — Telehealth: Payer: Self-pay | Admitting: Nurse Practitioner

## 2017-08-30 NOTE — Telephone Encounter (Signed)
Pt reports symptoms of Dizziness, Nausea:  Occurs after taking HCTZ during day.  Pt had previously tolerated this medication well, but was taking at night.  BP today 6/3: 149/68 HR 64 Yesterday 6/2: 133/59 HR 64  Newest BP medication Imdur.  Reduced amlodipine at last visit 2/2 hypotension and pt reported feeling better.  Pt had ED visit 5/31 with dizziness and palptations, but resolved with hydration.  Pt reports staying well hydrated.  Provided BP parameters, instructions on when to call back.  Pt may need visit to evaluate for vertigo/non-cardiac causes of dizziness.  Pt verbalizes understanding.

## 2017-08-30 NOTE — Telephone Encounter (Signed)
Pt said the hydrocholorthiazide is making her dizzy and nervous [phone]

## 2017-09-10 DIAGNOSIS — M7541 Impingement syndrome of right shoulder: Secondary | ICD-10-CM | POA: Insufficient documentation

## 2017-09-23 LAB — HM DIABETES EYE EXAM

## 2017-09-26 ENCOUNTER — Encounter: Payer: Self-pay | Admitting: Nurse Practitioner

## 2017-10-13 ENCOUNTER — Encounter: Payer: Self-pay | Admitting: Nurse Practitioner

## 2017-10-13 DIAGNOSIS — H3530 Unspecified macular degeneration: Secondary | ICD-10-CM | POA: Insufficient documentation

## 2017-10-18 ENCOUNTER — Encounter: Payer: Self-pay | Admitting: Nurse Practitioner

## 2017-10-18 ENCOUNTER — Ambulatory Visit (INDEPENDENT_AMBULATORY_CARE_PROVIDER_SITE_OTHER): Payer: Medicare Other | Admitting: Nurse Practitioner

## 2017-10-18 ENCOUNTER — Other Ambulatory Visit: Payer: Self-pay

## 2017-10-18 VITALS — BP 119/66 | HR 86 | Temp 97.8°F | Ht 64.0 in | Wt 163.8 lb

## 2017-10-18 DIAGNOSIS — E1169 Type 2 diabetes mellitus with other specified complication: Secondary | ICD-10-CM | POA: Diagnosis not present

## 2017-10-18 DIAGNOSIS — M791 Myalgia, unspecified site: Secondary | ICD-10-CM

## 2017-10-18 DIAGNOSIS — T466X5A Adverse effect of antihyperlipidemic and antiarteriosclerotic drugs, initial encounter: Secondary | ICD-10-CM

## 2017-10-18 DIAGNOSIS — K582 Mixed irritable bowel syndrome: Secondary | ICD-10-CM | POA: Diagnosis not present

## 2017-10-18 DIAGNOSIS — R52 Pain, unspecified: Secondary | ICD-10-CM | POA: Diagnosis not present

## 2017-10-18 DIAGNOSIS — T50905A Adverse effect of unspecified drugs, medicaments and biological substances, initial encounter: Secondary | ICD-10-CM

## 2017-10-18 DIAGNOSIS — E785 Hyperlipidemia, unspecified: Secondary | ICD-10-CM | POA: Diagnosis not present

## 2017-10-18 MED ORDER — ATORVASTATIN CALCIUM 20 MG PO TABS: 20.0000 mg | ORAL_TABLET | Freq: Every day | ORAL | 1 refills | Status: DC

## 2017-10-18 MED ORDER — DICYCLOMINE HCL 10 MG PO CAPS: ORAL_CAPSULE | ORAL | 6 refills | Status: DC

## 2017-10-18 NOTE — Patient Instructions (Addendum)
Burnard Leigh Rowles,   Thank you for coming in to clinic today.  1. Robaxin - take only twice daily as needed.  If not helping shoulder pain, STOP taking it.  2. STOP hydrochlorothiazide  3. For abdominal pain, cramping, and diarrhea: START dicyclomine 10 mg three times daily as needed. - If having diarrhea, stop taking Reglan those days.  4. REDUCE cholesterol medication atorvastatin to 20 mg - one tablet daily.    Please schedule a follow-up appointment with Cassell Smiles, AGNP. Return for as needed and as scheduled in August.  If you have any other questions or concerns, please feel free to call the clinic or send a message through [place]. You may also schedule an earlier appointment if necessary.  You will receive a survey after today's visit either digitally by e-mail or paper by [employer]. Your experiences and feedback matter to [name].  Please respond so we know how we are doing as we provide care for you.   Cassell Smiles, DNP, AGNP-BC Adult Gerontology Nurse Practitioner [place]

## 2017-10-18 NOTE — Progress Notes (Signed)
Subjective:    Patient ID: Helen Mccormick, female    DOB: May 28, 1940, 78 y.o.   MRN: [id number]  Helen Mccormick is a 77 y.o. female presenting on 10/18/2017 for Generalized Body Aches (constant back, legs, stomach pain x 1 mth. )   HPI Generalized body aches Patient reports generalized body aches that occurred in the evening after taking her evening medications for the last 1 month.  Patient believes HCTZ has been causing this, so she stopped this medication and has been off for 1 week.  Body aches in back and legs continue to occur.  She is not currently taking any tramadol, oxycodone, hydrocodone from Dr. Sharlet Salina. - She was seen by Dr. Tamala Julian at [facility] for shoulder impingement.  She was started on Robaxin, which has not helped.  This medication makes her feel weak and tired.  She is taking it twice a day every day not as needed.  Stomach pain Patient has a known history of IBS with constipation and diarrhea.  There is no predictable pattern to her cramping and abdominal pain with diarrhea.  She has no known food triggers.  She has been seen by Dr. Verl Blalock in the past and has tried Linzess or Amitiza without success.  She is currently on Reglan daily for her IBS with constipation.  Full medication review today indicates possible side effect of atorvastatin with muscle cramping and body aches.  Dose was increased in April 2019 20 mg to 40 mg daily.  Patient has been off HCTZ x7 days with normal blood pressure in clinic today.  Continue off HCTZ  Social History   Tobacco Use  . Smoking status: Never Smoker  . Smokeless tobacco: Never Used  Substance Use Topics  . Alcohol use: No  . Drug use: No    Review of Systems Per HPI unless specifically indicated above     Objective:    BP 119/66 (BP Location: Right Arm, Patient Position: Sitting, Cuff Size: Normal)   Pulse 86   Temp 97.8 F (36.6 C) (Oral)   Ht 5\' 4"  (1.626 m)   Wt 163 lb 12.8 oz (74.3 kg)   BMI 28.12 kg/m   Wt  Readings from Last 3 Encounters:  10/18/17 163 lb 12.8 oz (74.3 kg)  08/27/17 159 lb (72.1 kg)  08/20/17 159 lb 9.6 oz (72.4 kg)    Physical Exam  Constitutional: She is oriented to person, place, and time. Vital signs are normal. She appears well-developed and well-nourished. No distress.  HENT:  Head: Normocephalic and atraumatic.  Cardiovascular: Normal rate, regular rhythm, S1 normal, S2 normal, normal heart sounds and intact distal pulses.  Pulmonary/Chest: Effort normal and breath sounds normal. No respiratory distress.  Abdominal: Soft. Normal appearance. She exhibits no distension. Bowel sounds are increased. There is no hepatosplenomegaly. There is generalized tenderness and tenderness in the right lower quadrant, epigastric area, periumbilical area and left lower quadrant. No hernia.  Neurological: She is alert and oriented to person, place, and time.  Skin: Skin is warm and dry. Capillary refill takes less than 2 seconds.  Psychiatric: She has a normal mood and affect. Her behavior is normal. Judgment and thought content normal.    Results for orders placed or performed in visit on 10/13/17  HM DIABETES EYE EXAM  Result Value Ref Range   HM Diabetic Eye Exam No Retinopathy No Retinopathy      Assessment & Plan:   Problem List Items Addressed This Visit  Endocrine   Hyperlipidemia due to type 2 diabetes mellitus (HCC)   Relevant Medications   metFORMIN (GLUCOPHAGE) 500 MG tablet   omega-3 acid ethyl esters (LOVAZA) 1 g capsule   atorvastatin (LIPITOR) 20 MG tablet    Other Visit Diagnoses    Irritable bowel syndrome with both constipation and diarrhea    -  Primary   Relevant Medications   metoCLOPramide (REGLAN) 5 MG tablet   dicyclomine (BENTYL) 10 MG capsule   Generalized body aches       Adverse effect of drug, initial encounter       Myalgia due to statin         #Myalgia, adverse effect, generalized body aches, hyperlipidemia Full medication review  today indicates possible side effect of atorvastatin with muscle cramping and body aches.  Dose was increased in April 2019 20 mg to 40 mg daily.  Patient has been off HCTZ x7 days with normal blood pressure in clinic today.  Continue off HCTZ.   - Reduce to atorvastatin 20 mg once daily.ASCVD risk reduction is important to have maximally tolerated statin.  #IBS with constipation and diarrhea Currently uncontrolled with generalized abdominal pain, nausea, increased bowel sounds consistent with diarrhea phase. - Stop Reglan during diarrhea.  Use only during constipation phase. - Start dicyclomine 10 mg before meals as needed abdominal cramping, diarrhea.   Meds ordered this encounter  Medications  . atorvastatin (LIPITOR) 20 MG tablet    Sig: Take 1 tablet (20 mg total) by mouth at bedtime.    Dispense:  90 tablet    Refill:  1    Order Specific Question:   Supervising Provider    Answer:   Olin Hauser [[id number]]  . dicyclomine (BENTYL) 10 MG capsule    Sig: Take 1 capsule three times daily with meals as needed for abdominal pain or cramping with diarrhea.    Dispense:  90 capsule    Refill:  6    Order Specific Question:   Supervising Provider    Answer:   Olin Hauser [[id number]]    Follow up plan: Return as needed and as scheduled in August.  Cassell Smiles, DNP, AGPCNP-BC Adult Gerontology Primary Care Nurse Practitioner [facility] 10/18/2017, 12:06 PM

## 2017-11-02 ENCOUNTER — Other Ambulatory Visit: Payer: Self-pay | Admitting: Nurse Practitioner

## 2017-11-02 DIAGNOSIS — E890 Postprocedural hypothyroidism: Secondary | ICD-10-CM

## 2017-11-12 ENCOUNTER — Ambulatory Visit: Payer: Self-pay | Admitting: Nurse Practitioner

## 2017-11-13 ENCOUNTER — Other Ambulatory Visit: Payer: Self-pay

## 2017-11-13 ENCOUNTER — Emergency Department
Admission: EM | Admit: 2017-11-13 | Discharge: 2017-11-13 | Disposition: A | Discharge status: Home / Self Care | Payer: Medicare Other | Attending: Student in an Organized Health Care Education/Training Program | Admitting: Student in an Organized Health Care Education/Training Program

## 2017-11-13 ENCOUNTER — Emergency Department: Payer: Medicare Other

## 2017-11-13 DIAGNOSIS — J029 Acute pharyngitis, unspecified: Secondary | ICD-10-CM | POA: Diagnosis not present

## 2017-11-13 DIAGNOSIS — J45909 Unspecified asthma, uncomplicated: Secondary | ICD-10-CM | POA: Diagnosis not present

## 2017-11-13 DIAGNOSIS — E119 Type 2 diabetes mellitus without complications: Secondary | ICD-10-CM | POA: Insufficient documentation

## 2017-11-13 DIAGNOSIS — I10 Essential (primary) hypertension: Secondary | ICD-10-CM | POA: Diagnosis not present

## 2017-11-13 DIAGNOSIS — Z7982 Long term (current) use of aspirin: Secondary | ICD-10-CM | POA: Diagnosis not present

## 2017-11-13 DIAGNOSIS — Z79899 Other long term (current) drug therapy: Secondary | ICD-10-CM | POA: Insufficient documentation

## 2017-11-13 DIAGNOSIS — Z7984 Long term (current) use of oral hypoglycemic drugs: Secondary | ICD-10-CM | POA: Insufficient documentation

## 2017-11-13 LAB — CBC
HCT: 36.4 % (ref 35.0–47.0)
Hemoglobin: 11.8 g/dL — ABNORMAL LOW (ref 12.0–16.0)
MCH: 24.3 pg — ABNORMAL LOW (ref 26.0–34.0)
MCHC: 32.4 g/dL (ref 32.0–36.0)
MCV: 74.9 fL — ABNORMAL LOW (ref 80.0–100.0)
Platelets: 184 10*3/uL (ref 150–440)
RBC: 4.86 MIL/uL (ref 3.80–5.20)
RDW: 15 % — ABNORMAL HIGH (ref 11.5–14.5)
WBC: 17.6 10*3/uL — ABNORMAL HIGH (ref 3.6–11.0)

## 2017-11-13 LAB — BASIC METABOLIC PANEL
Anion gap: 12 (ref 5–15)
BUN: 14 mg/dL (ref 8–23)
CO2: 23 mmol/L (ref 22–32)
Calcium: 8.5 mg/dL — ABNORMAL LOW (ref 8.9–10.3)
Chloride: 103 mmol/L (ref 98–111)
Creatinine, Ser: 1.04 mg/dL — ABNORMAL HIGH (ref 0.44–1.00)
GFR calc Af Amer: 59 mL/min — ABNORMAL LOW (ref >60)
GFR calc non Af Amer: 51 mL/min — ABNORMAL LOW (ref >60)
Glucose, Bld: 240 mg/dL — ABNORMAL HIGH (ref 70–99)
Potassium: 3.2 mmol/L — ABNORMAL LOW (ref 3.5–5.1)
Sodium: 138 mmol/L (ref 135–145)

## 2017-11-13 LAB — TROPONIN I: Troponin I: <0.03 ng/mL (ref <0.03)

## 2017-11-13 MED ORDER — AMOXICILLIN 500 MG PO CAPS
ORAL_CAPSULE | ORAL | Status: AC
  Filled 2017-11-13: qty 1

## 2017-11-13 MED ORDER — DEXAMETHASONE 4 MG PO TABS
10.0000 mg | ORAL_TABLET | Freq: Once | ORAL | Status: AC
  Administered 2017-11-13: 10 mg via ORAL
  Filled 2017-11-13: qty 2.5

## 2017-11-13 MED ORDER — AMOXICILLIN 500 MG PO CAPS
1000.0000 mg | ORAL_CAPSULE | Freq: Once | ORAL | Status: AC
  Administered 2017-11-13: 1000 mg via ORAL
  Filled 2017-11-13: qty 2

## 2017-11-13 MED ORDER — ACETAMINOPHEN 500 MG PO TABS
1000.0000 mg | ORAL_TABLET | Freq: Once | ORAL | Status: AC
  Administered 2017-11-13: 1000 mg via ORAL
  Filled 2017-11-13: qty 2

## 2017-11-13 MED ORDER — AMOXICILLIN 500 MG PO CAPS: 500.0000 mg | ORAL_CAPSULE | Freq: Three times a day (TID) | ORAL | 0 refills | Status: AC

## 2017-11-13 MED ORDER — FLUCONAZOLE 150 MG PO TABS: 150.0000 mg | ORAL_TABLET | Freq: Once | ORAL | 0 refills | Status: DC | PRN

## 2017-11-13 NOTE — ED Notes (Signed)
Patient transported to X-ray 

## 2017-11-13 NOTE — ED Triage Notes (Signed)
Pt to ed with c/o CP, sore throat, right ear pain and cough that started last night.  EKG done on arrival to triage room and shown to Dr. Quentin Cornwall for review.  Pt states pain is constant, central chest, and that she feels mildly sob with the pain.

## 2017-11-13 NOTE — ED Provider Notes (Signed)
[facility] Emergency Department Provider Note    First MD Initiated Contact with Patient 11/13/17 1125     (approximate)  I have reviewed the triage vital signs and the nursing notes.   HISTORY  Chief Complaint Chest Pain; Cough; and Sore Throat    HPI Helen Mccormick is a 77 y.o. female complaint of sore throat right ear pain and chest discomfort since yesterday.  Denies any cough.  Has had chills at home but no measured fevers.  Is having pain with swallowing.  No recent known sick contacts.  No change in her voice.  States that the pain is mild to moderate in severity.  Denies any neck stiffness.  No headaches.  No nausea or vomiting.  No dysuria.    Past Medical History:  Diagnosis Date  . Allergy   . Anxiety   . Asthma   . Colon polyp   . Depression   . Diabetes mellitus without complication ([place])   . GERD (gastroesophageal reflux disease)   . Heart murmur   . Hyperlipidemia   . Hypertension   . Sleep apnea   . Thyroid disease    Family History  Problem Relation Age of Onset  . Breast cancer Maternal Aunt 60  . Diabetes Mother   . Leukemia Mother   . Diabetes Sister   . Mental illness Sister   . Colon polyps Sister   . Stroke Father   . Cancer Brother        rectal  . Healthy Daughter   . Hypertension Son   . Diabetes Sister   . Thyroid disease Sister   . Healthy Sister   . Healthy Sister   . Stroke Brother   . Healthy Son   . Healthy Son   . Healthy Daughter    Past Surgical History:  Procedure Laterality Date  . ABDOMINAL HYSTERECTOMY    . BACK SURGERY    . BACK SURGERY    . CERVICAL FUSION    . COLONOSCOPY WITH PROPOFOL N/A 05/28/2017   Procedure: COLONOSCOPY WITH PROPOFOL;  Surgeon: Jonathon Bellows, MD;  Location[facility] ENDOSCOPY;  Service: Gastroenterology;  Laterality: N/A;  . NECK SURGERY    . THYROID SURGERY    . THYROID SURGERY     Patient Active Problem List   Diagnosis Date Noted  . 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 Angina at rest [facility]) 08/15/2017  . Fracture of distal end of radius 06/03/2017  . Hypertension 03/24/2017  . DM (diabetes mellitus), type 2 ([place]) 03/24/2017  . 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 Hyperlipidemia due to type 2 diabetes mellitus ([place]) 03/24/2017  . Cervical radiculitis 12/14/2013  . DDD (degenerative disc disease), cervical 12/14/2013  . Left shoulder pain 12/14/2013      Prior to Admission medications   Medication Sig Start Date End Date Taking? Authorizing Provider  albuterol (PROVENTIL HFA;VENTOLIN HFA) 108 (90 Base) MCG/ACT inhaler Inhale into the lungs every 6 (six) hours as needed for wheezing or shortness of breath.    [provider]  amLODipine (NORVASC) 5 MG tablet Take 1 tablet (5 mg total) by mouth daily. 08/20/17   [name], NP  amoxicillin (AMOXIL) 500 MG capsule Take 1 capsule (500 mg total) by mouth 3 (three) times daily for 7 days. 11/13/17 11/20/17  Merlyn Lot, MD  aspirin 81 MG chewable tablet Chew 81 mg by mouth daily.     [provider]  atorvastatin (LIPITOR) 20 MG  tablet Take 1 tablet (20 mg total) by mouth at bedtime. 10/18/17   [name], NP  cholecalciferol (VITAMIN D) 1000 units tablet Take 1 tablet by mouth daily.     [provider]  dicyclomine (BENTYL) 10 MG capsule Take 1 capsule three times daily with meals as needed for abdominal pain or cramping with diarrhea. 10/18/17   [name], NP  escitalopram (LEXAPRO) 10 MG tablet TAKE 1 TABLET BY MOUTH EVERY DAY FOR DEPRESSION 08/13/17   [name], NP  ferrous sulfate 325 (65 FE) MG tablet Take 325 mg by mouth daily with breakfast.    [provider]  fluconazole (DIFLUCAN) 150 MG tablet Take 1 tablet (150 mg total) by mouth once as needed for up to 1 dose (for yeast infection). 11/13/17   Merlyn Lot, MD  fluticasone (FLONASE) 50 MCG/ACT nasal spray Place 2 sprays into both  nostrils daily.    [provider]  glucose blood test strip Use 1 strip to check your blood sugar two times daily. 03/12/17   [name], NP  isosorbide mononitrate (IMDUR) 30 MG 24 hr tablet Take 1 tablet (30 mg total) by mouth daily. 08/16/17 09/15/17  Saundra Shelling, MD  levothyroxine (SYNTHROID, LEVOTHROID) 75 MCG tablet TAKE 1 TABLET(75 MCG) BY MOUTH DAILY BEFORE BREAKFAST 11/02/17   [name], NP  metFORMIN (GLUCOPHAGE) 500 MG tablet  09/16/17   [provider]  methocarbamol (ROBAXIN) 500 MG tablet methocarbamol 500 mg tablet  TAKE 1 TABLET BY MOUTH TWICE DAILY AS NEEDED    [provider]  metoCLOPramide (REGLAN) 5 MG tablet metoclopramide 5 mg tablet    [provider]  nitroGLYCERIN (NITROSTAT) 0.4 MG SL tablet Place 1 tablet (0.4 mg total) under the tongue every 5 (five) minutes as needed for chest pain. 08/20/17   [name], NP  Omega-3 1000 MG CAPS Take 1 capsule by mouth daily.     [provider]  omega-3 acid ethyl esters (LOVAZA) 1 g capsule  09/21/17   [provider]  omeprazole (PRILOSEC) 40 MG capsule Take 1 capsule (40 mg total) by mouth daily. 07/06/17   [name], NP    Allergies Amlodipine; Etodolac; and Tramadol    Social History Social History   Tobacco Use  . Smoking status: Never Smoker  . Smokeless tobacco: Never Used  Substance Use Topics  . Alcohol use: No  . Drug use: No    Review of Systems Patient denies headaches, rhinorrhea, blurry vision, numbness, shortness of breath, chest pain, edema, cough, abdominal pain, nausea, vomiting, diarrhea, dysuria, fevers, rashes or hallucinations unless otherwise stated above in HPI. ____________________________________________   PHYSICAL EXAM:  VITAL SIGNS: Vitals:   11/13/17 1300 11/13/17 1329  BP: (!) 146/71   Pulse:  (!) 104  Resp:  16  Temp:    SpO2:  98%    Constitutional: Alert and oriented.  Eyes:  Conjunctivae are normal.  Head: Atraumatic. Nose: No congestion/rhinnorhea. Mouth/Throat: Mucous membranes are moist.  Uvula is midline right greater than left tonsillar erythema with exudates on the right.  No trismus.  No sublingual or tenderness palpation or firmness. EAR: Right ear has purulent right-sided effusion bulging tympanic membrane.  Left is clear. Neck: No stridor. Painless ROM. Tender right anterior cervical chain adenopathy Cardiovascular: Normal rate, regular rhythm. Grossly normal heart sounds.  Good peripheral circulation. Respiratory: Normal respiratory effort.  No retractions. Lungs CTAB. Gastrointestinal: Soft and nontender. No distention. No abdominal  bruits. No CVA tenderness. Genitourinary:  Musculoskeletal: No lower extremity tenderness nor edema.  No joint effusions. Neurologic:  Normal speech and language. No gross focal neurologic deficits are appreciated. No facial droop Skin:  Skin is warm, dry and intact. No rash noted. Psychiatric: Mood and affect are normal. Speech and behavior are normal.  ____________________________________________   LABS (all labs ordered are listed, but only abnormal results are displayed)  Results for orders placed or performed during the hospital encounter of 11/13/17 (from the past 24 hour(s))  Basic metabolic panel     Status: Abnormal   Collection Time: 11/13/17 11:40 AM  Result Value Ref Range   Sodium 138 135 - 145 mmol/L   Potassium 3.2 (L) 3.5 - 5.1 mmol/L   Chloride 103 98 - 111 mmol/L   CO2 23 22 - 32 mmol/L   Glucose, Bld 240 (H) 70 - 99 mg/dL   BUN 14 8 - 23 mg/dL   Creatinine, Ser 1.04 (H) 0.44 - 1.00 mg/dL   Calcium 8.5 (L) 8.9 - 10.3 mg/dL   GFR calc non Af Amer 51 (L) >60 mL/min   GFR calc Af Amer 59 (L) >60 mL/min   Anion gap 12 5 - 15  CBC     Status: Abnormal   Collection Time: 11/13/17 11:40 AM  Result Value Ref Range   WBC 17.6 (H) 3.6 - 11.0 K/uL   RBC 4.86 3.80 - 5.20 MIL/uL   Hemoglobin 11.8 (L)  12.0 - 16.0 g/dL   HCT 36.4 35.0 - 47.0 %   MCV 74.9 (L) 80.0 - 100.0 fL   MCH 24.3 (L) 26.0 - 34.0 pg   MCHC 32.4 32.0 - 36.0 g/dL   RDW 15.0 (H) 11.5 - 14.5 %   Platelets 184 150 - 440 K/uL  Troponin I     Status: None   Collection Time: 11/13/17 11:40 AM  Result Value Ref Range   Troponin I <0.03 <0.03 ng/mL   ____________________________________________  EKG My review and personal interpretation at Time: 11:10   Indication: chest pain  Rate: 100  Rhythm: sinus Axis: left Other: lbbb, no stemi ____________________________________________  RADIOLOGY  I personally reviewed all radiographic images ordered to evaluate for the above acute complaints and reviewed radiology reports and findings.  These findings were personally discussed with the patient.  Please see medical record for radiology report.  ____________________________________________   PROCEDURES  Procedure(s) performed:  Procedures    Critical Care performed: no ____________________________________________   INITIAL IMPRESSION / ASSESSMENT AND PLAN / ED COURSE  Pertinent labs & imaging results that were available during my care of the patient were reviewed by me and considered in my medical decision making (see chart for details).   DDX: uri, pharyngitis, aom, pna, copd, asthma, acs  Jaeley Wiker is a 77 y.o. who presents to the ED with his as described above.  Patient afebrile mildly tachycardic upon arrival to the ER but appears well perfused.  Blood work is reassuring.  No evidence of EKG changes.  Does not seem clinically consistent with ACS.  Clinically is most consistent with strep pharyngitis.  Patient will be initiated on empiric antibiotic treatment given her symptoms as well as given a dose of Decadron.  At this time it does not appear clinically consistent with peritonsillar abscess.      As part of my medical decision making, I reviewed the following data within the electronic medical  record:  Nursing notes reviewed and incorporated, Labs reviewed, notes  from prior ED visits and [place] Controlled Substance Database   ____________________________________________   FINAL CLINICAL IMPRESSION(S) / ED DIAGNOSES  Final diagnoses:  Pharyngitis, unspecified etiology      NEW MEDICATIONS STARTED DURING THIS VISIT:  Discharge Medication List as of 11/13/2017 12:40 PM    START taking these medications   Details  amoxicillin (AMOXIL) 500 MG capsule Take 1 capsule (500 mg total) by mouth 3 (three) times daily for 7 days., Starting Sat 11/13/2017, Until Sat 11/20/2017, Print    fluconazole (DIFLUCAN) 150 MG tablet Take 1 tablet (150 mg total) by mouth once as needed for up to 1 dose (for yeast infection)., Starting Sat 11/13/2017, Normal         Note:  This document was prepared using Dragon voice recognition software and may include unintentional dictation errors.    Merlyn Lot, MD 11/13/17 [phone]

## 2017-11-25 ENCOUNTER — Other Ambulatory Visit: Payer: Self-pay | Admitting: Nurse Practitioner

## 2017-11-25 DIAGNOSIS — I10 Essential (primary) hypertension: Secondary | ICD-10-CM

## 2017-11-25 DIAGNOSIS — I952 Hypotension due to drugs: Secondary | ICD-10-CM

## 2017-11-30 ENCOUNTER — Telehealth: Payer: Self-pay

## 2017-11-30 ENCOUNTER — Ambulatory Visit
Admission: RE | Admit: 2017-11-30 | Discharge: 2017-11-30 | Disposition: A | Discharge status: Home / Self Care | Payer: Medicare Other | Source: Ambulatory Visit | Attending: Nurse Practitioner | Admitting: Nurse Practitioner

## 2017-11-30 ENCOUNTER — Encounter: Payer: Self-pay | Admitting: Nurse Practitioner

## 2017-11-30 ENCOUNTER — Ambulatory Visit (INDEPENDENT_AMBULATORY_CARE_PROVIDER_SITE_OTHER): Payer: Medicare Other | Admitting: Nurse Practitioner

## 2017-11-30 VITALS — BP 148/77 | HR 80 | Ht 64.0 in | Wt 164.8 lb

## 2017-11-30 DIAGNOSIS — M25512 Pain in left shoulder: Secondary | ICD-10-CM

## 2017-11-30 DIAGNOSIS — M25511 Pain in right shoulder: Secondary | ICD-10-CM | POA: Diagnosis present

## 2017-11-30 DIAGNOSIS — M19012 Primary osteoarthritis, left shoulder: Secondary | ICD-10-CM | POA: Diagnosis not present

## 2017-11-30 DIAGNOSIS — D649 Anemia, unspecified: Secondary | ICD-10-CM

## 2017-11-30 DIAGNOSIS — G8929 Other chronic pain: Secondary | ICD-10-CM

## 2017-11-30 DIAGNOSIS — E1169 Type 2 diabetes mellitus with other specified complication: Secondary | ICD-10-CM | POA: Diagnosis not present

## 2017-11-30 DIAGNOSIS — E890 Postprocedural hypothyroidism: Secondary | ICD-10-CM

## 2017-11-30 DIAGNOSIS — M19011 Primary osteoarthritis, right shoulder: Secondary | ICD-10-CM | POA: Insufficient documentation

## 2017-11-30 LAB — POCT GLYCOSYLATED HEMOGLOBIN (HGB A1C): Hemoglobin A1C: 6.8 % — AB (ref 4.0–5.6)

## 2017-11-30 MED ORDER — METFORMIN HCL ER 500 MG PO TB24
500.0000 mg | ORAL_TABLET | Freq: Two times a day (BID) | ORAL | 6 refills | Status: DC
Start: 2017-11-30 — End: 2018-06-21

## 2017-11-30 NOTE — Telephone Encounter (Signed)
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  Continue plan as recommended at visit.

## 2017-11-30 NOTE — Patient Instructions (Addendum)
Helen Mccormick,   Thank you for coming in to clinic today.  1. Great work with your DM your A1c is at goal today!  2. Thyroid - we need labs.  3. We are also checking blood counts and iron.    4. You have either arthritis, bursitis, or rotator cuff muscle strain. We are checking xray today.  - Start taking Tylenol extra strength 1 to 2 tablets every 6-8 hours for aches or fever/chills for next few days as needed.  Do not take more than 3,000 mg in 24 hours from all medicines.   - Use heat and ice.  Apply this for 15 minutes at a time 6-8 times per day.   - Muscle rub with lidocaine, lidocaine patch, Biofreeze, or tiger balm for topical pain relief.  Avoid using this with heat and ice to avoid burns.  Please schedule a follow-up appointment with Cassell Smiles, AGNP. Return in about 3 months (around 03/01/2018) for diabetes.  If you have any other questions or concerns, please feel free to call the clinic or send a message through [place]. You may also schedule an earlier appointment if necessary.  You will receive a survey after today's visit either digitally by e-mail or paper by [employer]. Your experiences and feedback matter to [name].  Please respond so we know how we are doing as we provide care for you.   Cassell Smiles, DNP, AGNP-BC Adult Gerontology Nurse Practitioner [place]

## 2017-11-30 NOTE — Progress Notes (Signed)
Subjective:    Patient ID: Helen Mccormick, female    DOB: 10/16/1940, 77 y.o.   MRN: [id number]  Helen Mccormick is a 77 y.o. female presenting on 11/30/2017 for Hypothyroidism and Diabetes   HPI Diabetes Pt presents today for follow up of Type 2 diabetes mellitus. She is checking fasting am CBG at home with a range of 99,124, 170. - Current diabetic medications include: metformin -with side effect of diarrhea - She is not currently symptomatic.  - She denies polydipsia, polyphagia, polyuria, headaches, diaphoresis, shakiness, chills, pain, numbness or tingling in extremities and changes in vision.   - Clinical course has been stable. - She  reports an exercise routine that includes walking, 3-4 days per week, reduced some with hot weather, however - Her diet is low in salt, low in fat, and low in carbohydrates.  Eating lots of good fruits, vegetables.  Eats lots of fish, chicken. - Weight trend: fluctuating a bit  PREVENTION: Eye exam current (within one year): yes Foot exam current (within one year): yes Lipid/ASCVD risk reduction - on statin: yes Kidney protection - on ace or arb: no Recent Labs    06/11/17 1620 11/30/17 1705  HGBA1C 7.1 6.8*     Hypothyroidism - Pt states she is taking her levothyroxine 79mcg in the am at least 1 hour before eating or drinking and taking other medicines.   - She is symptomatic with  hair falling out, dry skin, cold sensitivity.  Patient has been told she may have low iron that is causing her hair loss (dermatology).  - She denies, fatigue, excess energy, weight changes, heart racing, heart palpitations, heat and cold intolerance, changes in nails, and lower leg swelling.  - She does not have any compressive symptoms to include difficulty swallowing, globus sensation, or difficulty breathing when lying flat.   Musculoskeletal Pain Patient is continuing to complain of pain in bilateral shoulders and neck.  Physical therapy has helped some,  but pain persists.  Patient is taking Tylenol with some relief.  Is trying to keep arms active to prevent worsening, which does help keep pain at lower levels the next day.    Social History   Tobacco Use  . Smoking status: Never Smoker  . Smokeless tobacco: Never Used  Substance Use Topics  . Alcohol use: No  . Drug use: No    Review of Systems Per HPI unless specifically indicated above     Objective:    BP (!) 148/77 (BP Location: Right Arm, Patient Position: Sitting, Cuff Size: Normal)   Pulse 80   Ht 5\' 4"  (1.626 m)   Wt 164 lb 12.8 oz (74.8 kg)   BMI 28.29 kg/m   Wt Readings from Last 3 Encounters:  11/30/17 164 lb 12.8 oz (74.8 kg)  11/13/17 158 lb (71.7 kg)  10/18/17 163 lb 12.8 oz (74.3 kg)    Physical Exam  Constitutional: She is oriented to person, place, and time. She appears well-developed and well-nourished. No distress.  HENT:  Head: Normocephalic and atraumatic.  Cardiovascular: Normal rate, regular rhythm, S1 normal, S2 normal, normal heart sounds and intact distal pulses.  Pulmonary/Chest: Effort normal and breath sounds normal. No respiratory distress.  Musculoskeletal:  Bilateral Shoulder Inspection: Normal appearance bilateral symmetrical Palpation: Non-tender to palpation over anterior, lateral, or posterior shoulder  ROM: Intact AROM forward flexion, internal / external rotation, symmetrical.  Limited ROM with abduction to approximately 130 degrees before pain. Special Testing: Rotator cuff testing negative for  weakness with supraspinatus full can.  Positive for pain, but no weakness with supraspinatus empty can test. O'brien's negative for labral pain, Hawkin's AC impingement negative for pain. Strength: Normal strength 5/5 flex/ext, ext rot / int rot, grip, rotator cuff str testing. Neurovascular: Distally intact pulses, sensation to light touch  Neurological: She is alert and oriented to person, place, and time.  Skin: Skin is warm and dry.    Psychiatric: She has a normal mood and affect. Her behavior is normal.  Vitals reviewed.    Results for orders placed or performed during the hospital encounter of 78/29/56  Basic metabolic panel  Result Value Ref Range   Sodium 138 135 - 145 mmol/L   Potassium 3.2 (L) 3.5 - 5.1 mmol/L   Chloride 103 98 - 111 mmol/L   CO2 23 22 - 32 mmol/L   Glucose, Bld 240 (H) 70 - 99 mg/dL   BUN 14 8 - 23 mg/dL   Creatinine, Ser 1.04 (H) 0.44 - 1.00 mg/dL   Calcium 8.5 (L) 8.9 - 10.3 mg/dL   GFR calc non Af Amer 51 (L) >60 mL/min   GFR calc Af Amer 59 (L) >60 mL/min   Anion gap 12 5 - 15  CBC  Result Value Ref Range   WBC 17.6 (H) 3.6 - 11.0 K/uL   RBC 4.86 3.80 - 5.20 MIL/uL   Hemoglobin 11.8 (L) 12.0 - 16.0 g/dL   HCT 36.4 35.0 - 47.0 %   MCV 74.9 (L) 80.0 - 100.0 fL   MCH 24.3 (L) 26.0 - 34.0 pg   MCHC 32.4 32.0 - 36.0 g/dL   RDW 15.0 (H) 11.5 - 14.5 %   Platelets 184 150 - 440 K/uL  Troponin I  Result Value Ref Range   Troponin I <0.03 <0.03 ng/mL      Assessment & Plan:   Problem List Items Addressed This Visit      Endocrine   DM (diabetes mellitus), type 2 ([place]) - Primary    ControlledDM with A1c 6.8% improved from 7.1% and goal A1c < 2.1%. - Known complications or hyperlipidemia  Plan:  1. Change therapy:  - START metformin XR 500 mg once daily.  May help diarrhea side effect 2. Encourage improved lifestyle: - low carb/low glycemic diet reinforced prior education - Increase physical activity to 30 minutes most days of the week.  Explained that increased physical activity increases body's use of sugar for energy. 3. Check fasting am CBG and bring log to next visit for review 4. Continue ASA and Statin 5. Foot and ophthalmology exams are up to date. 6. Follow-up 3 months       Relevant Medications   metFORMIN (GLUCOPHAGE-XR) 500 MG 24 hr tablet   Other Relevant Orders   POCT glycosylated hemoglobin (Hb A1C) (Completed)   COMPLETE METABOLIC PANEL WITH GFR   Lipid  panel   Hypothyroidism, postsurgical    Pt w/ daily levothyroxine s/p thyroidectomy several years ago. Pt reports she has had this stable dose of levothyroxine 75 mcg for several years.  Plan: 1. Continue levothyroxine once daily. 2. Recheck TSH today. 3. Followup 3 months.      Relevant Orders   TSH + free T4    Other Visit Diagnoses    Anemia, unspecified type     Patient with anemia on last check and with history of anemia.  Hair loss could indicate iron deficiency.  Check labs today.  Followup prn after labs.   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  No prior Xray evaluation.  Plan:  1. Treat with OTC pain meds (acetaminophen and ibuprofen).  Discussed alternate dosing and max dosing. 2. Apply heat and/or ice to affected area. 3. May also apply a muscle rub with lidocaine or lidocaine patch after heat or ice. 4. Bilateral shoulder Xrays today.  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 5. Follow up prn.  Consider PT vs orthopedics.  Continue movement.  May benefit in future from voltaren gel for pain relief.     Relevant Orders   DG Shoulder Left (Completed)   DG Shoulder Right (Completed)      Meds ordered this encounter  Medications  . metFORMIN (GLUCOPHAGE-XR) 500 MG 24 hr tablet    Sig: Take 1 tablet (500 mg total) by mouth 2 (two) times daily with a meal.    Dispense:  60 tablet    Refill:  6    Order Specific Question:   Supervising Provider    Answer:   Olin Hauser [[id number]]    Follow up plan: Return in about 3 months (around 03/01/2018) for diabetes.  Cassell Smiles, DNP, AGPCNP-BC Adult Gerontology Primary Care Nurse Practitioner [facility] 11/30/2017, 10:16 AM

## 2017-12-01 ENCOUNTER — Other Ambulatory Visit: Payer: Self-pay | Admitting: Nurse Practitioner

## 2017-12-01 ENCOUNTER — Other Ambulatory Visit: Payer: Self-pay | Admitting: Internal Medicine

## 2017-12-01 DIAGNOSIS — Z1231 Encounter for screening mammogram for malignant neoplasm of breast: Secondary | ICD-10-CM

## 2017-12-07 ENCOUNTER — Encounter: Payer: Self-pay | Admitting: Nurse Practitioner

## 2017-12-07 NOTE — Assessment & Plan Note (Signed)
Pt w/ daily levothyroxine s/p thyroidectomy several years ago. Pt reports she has had this stable dose of levothyroxine 75 mcg for several years.  Plan: 1. Continue levothyroxine once daily. 2. Recheck TSH today. 3. Followup 3 months.

## 2017-12-07 NOTE — Assessment & Plan Note (Addendum)
ControlledDM with A1c 6.8% improved from 7.1% and goal A1c < 9.1%. - Known complications or hyperlipidemia  Plan:  1. Change therapy:  - START metformin XR 500 mg once daily.  May help diarrhea side effect 2. Encourage improved lifestyle: - low carb/low glycemic diet reinforced prior education - Increase physical activity to 30 minutes most days of the week.  Explained that increased physical activity increases body's use of sugar for energy. 3. Check fasting am CBG and bring log to next visit for review 4. Continue ASA and Statin 5. Foot and ophthalmology exams are up to date. 6. Follow-up 3 months

## 2017-12-13 ENCOUNTER — Ambulatory Visit
Admission: RE | Admit: 2017-12-13 | Discharge: 2017-12-13 | Disposition: A | Discharge status: Home / Self Care | Payer: Medicare Other | Source: Ambulatory Visit | Attending: Nurse Practitioner | Admitting: Nurse Practitioner

## 2017-12-13 DIAGNOSIS — Z1231 Encounter for screening mammogram for malignant neoplasm of breast: Secondary | ICD-10-CM | POA: Diagnosis present

## 2017-12-28 ENCOUNTER — Encounter: Payer: Self-pay | Admitting: Nurse Practitioner

## 2017-12-28 ENCOUNTER — Other Ambulatory Visit: Payer: Self-pay

## 2017-12-28 ENCOUNTER — Ambulatory Visit (INDEPENDENT_AMBULATORY_CARE_PROVIDER_SITE_OTHER): Payer: Medicare Other | Admitting: Nurse Practitioner

## 2017-12-28 VITALS — BP 127/63 | HR 77 | Temp 97.9°F | Ht 64.0 in | Wt 165.2 lb

## 2017-12-28 DIAGNOSIS — J069 Acute upper respiratory infection, unspecified: Secondary | ICD-10-CM | POA: Diagnosis not present

## 2017-12-28 DIAGNOSIS — Z23 Encounter for immunization: Secondary | ICD-10-CM

## 2017-12-28 NOTE — Progress Notes (Signed)
Subjective:    Patient ID: Helen Mccormick, female    DOB: [birth year], 77 y.o.   MRN: [id number]  Helen Mccormick is a 77 y.o. female presenting on 12/28/2017 for Cough (chest congestion, couging up phelgm w/ trace of blood x 2 days)   HPI Cough/URI Symptoms started 2 days ago.  Patient is presenting with symptoms of chest congestion, productive cough.  Has had nasal/sinus congestion.  Small speck bloody sputum with cough last night. Is having chills/sweats.   - No changes in hearing.  Is having swelling of neck glands on R side. - No shortness of breath/wheezing.  Social History   Tobacco Use  . Smoking status: Never Smoker  . Smokeless tobacco: Never Used  Substance Use Topics  . Alcohol use: No  . Drug use: No    Review of Systems Per HPI unless specifically indicated above     Objective:    BP 127/63 (BP Location: Right Arm, Patient Position: Sitting, Cuff Size: Normal)   Pulse 77   Temp 97.9 F (36.6 C) (Oral)   Ht 5\' 4"  (1.626 m)   Wt 165 lb 3.2 oz (74.9 kg)   BMI 28.36 kg/m   Wt Readings from Last 3 Encounters:  12/28/17 165 lb 3.2 oz (74.9 kg)  11/30/17 164 lb 12.8 oz (74.8 kg)  11/13/17 158 lb (71.7 kg)    Physical Exam  Constitutional: She appears well-developed and well-nourished.  HENT:  Head: Normocephalic and atraumatic.  Right Ear: Hearing, tympanic membrane, external ear and ear canal normal.  Left Ear: Hearing, tympanic membrane, external ear and ear canal normal.  Nose: Mucosal edema and rhinorrhea present. Right sinus exhibits maxillary sinus tenderness and frontal sinus tenderness. Left sinus exhibits maxillary sinus tenderness and frontal sinus tenderness.  Mouth/Throat: Uvula is midline and mucous membranes are normal. Posterior oropharyngeal edema (cobblestoning) present. Oropharyngeal exudate: clear secretions.  Eyes: Pupils are equal, round, and reactive to light. Conjunctivae and EOM are normal. Right eye exhibits no discharge. Left eye  exhibits no discharge.  Neck: Normal range of motion. Neck supple.  Cardiovascular: Normal rate, regular rhythm, S1 normal, S2 normal, normal heart sounds and intact distal pulses.  Pulmonary/Chest: Effort normal. No respiratory distress. She has decreased breath sounds (throughout lungs).  Lymphadenopathy:    She has cervical adenopathy.  Neurological: She is alert.  Skin: Skin is warm and dry. Capillary refill takes less than 2 seconds.  Psychiatric: She has a normal mood and affect. Her behavior is normal.  Vitals reviewed.    Results for orders placed or performed in visit on 11/30/17  POCT glycosylated hemoglobin (Hb A1C)  Result Value Ref Range   Hemoglobin A1C 6.8 (A) 4.0 - 5.6 %   HbA1c POC (<> result, manual entry)     HbA1c, POC (prediabetic range)     HbA1c, POC (controlled diabetic range)        Assessment & Plan:   Problem List Items Addressed This Visit    None    Visit Diagnoses    Need for immunization against influenza     Pt > age 70.  Needs annual influenza vaccine.  Plan: 1. Administer high dose fluzone today.     Viral upper respiratory tract infection    - Primary Acute illness. Fever responsive to NSAIDs and tylenol.  Symptoms not worsening. Consistent with viral illness x 3 days with no known sick contacts and no identifiable focal infections of ears, nose, throat.  Plan: 1. Reassurance, likely  self-limited with cough lasting up to few weeks - Start anti-histamine Loratadine or Cetirizine 10mg  daily,  - also can use Flonase 2 sprays each nostril daily for up to 4-6 weeks - Start Mucinex-DM OTC up to 7-10 days then stop 2. Supportive care with nasal saline, warm herbal tea with honey, 3. Improve hydration 4. Tylenol / Motrin PRN fevers 5. Return criteria given        Follow up plan: Return 5-7 days if symptoms worsen or fail to improve.  Cassell Smiles, DNP, AGPCNP-BC Adult Gerontology Primary Care Nurse Practitioner [facility] 12/28/2017, 11:25 AM

## 2017-12-28 NOTE — Patient Instructions (Addendum)
Helen Mccormick,   Thank you for coming in to clinic today.  1. It sounds like you have a Upper Respiratory Virus - this will most likely run it's course in 7 to 10 days. Recommend good hand washing. - Continue anti-histamine loratadine or cetirizine 10mg  daily, also can use Flonase 2 sprays each nostril daily for up to 4-6 weeks - If congestion is worse, start OTC Mucinex (or may try Robitussin, Delsym, or Mucinex-DM for cough) up to 7-10 days then stop - Drink plenty of fluids to improve congestion - You may try over the counter Nasal Saline spray (Simply Saline, Ocean Spray) as needed to reduce congestion. - Drink warm herbal tea with honey for sore throat. - Start taking Tylenol extra strength 1 to 2 tablets every 6-8 hours for aches or fever/chills for next few days as needed.  Do not take more than 3,000 mg in 24 hours from all medicines.  May take Ibuprofen as well if tolerated 200-400mg  every 8 hours as needed.  If symptoms significantly worsening with persistent fevers/chills despite tylenol/ibpurofen, nausea, vomiting unable to tolerate food/fluids or medicine, body aches, or shortness of breath, sinus pain pressure or worsening productive cough, then follow-up for re-evaluation, may seek more immediate care at Urgent Care or ED if more concerned for emergency.  Please schedule a follow-up appointment with Cassell Smiles, AGNP. Return 5-7 days if symptoms worsen or fail to improve.  If you have any other questions or concerns, please feel free to call the clinic or send a message through [place]. You may also schedule an earlier appointment if necessary.  You will receive a survey after today's visit either digitally by e-mail or paper by [employer]. Your experiences and feedback matter to [name].  Please respond so we know how we are doing as we provide care for you.   Cassell Smiles, DNP, AGNP-BC Adult Gerontology Nurse Practitioner [place]

## 2017-12-30 ENCOUNTER — Other Ambulatory Visit: Payer: Self-pay | Admitting: Nurse Practitioner

## 2017-12-30 DIAGNOSIS — K219 Gastro-esophageal reflux disease without esophagitis: Secondary | ICD-10-CM

## 2017-12-30 DIAGNOSIS — E890 Postprocedural hypothyroidism: Secondary | ICD-10-CM

## 2018-01-13 ENCOUNTER — Other Ambulatory Visit: Payer: Self-pay

## 2018-01-13 ENCOUNTER — Encounter: Payer: Self-pay | Admitting: Nurse Practitioner

## 2018-01-13 ENCOUNTER — Ambulatory Visit (INDEPENDENT_AMBULATORY_CARE_PROVIDER_SITE_OTHER): Payer: Medicare Other | Admitting: Nurse Practitioner

## 2018-01-13 VITALS — BP 139/64 | HR 79 | Temp 97.9°F | Ht 64.0 in | Wt 168.2 lb

## 2018-01-13 DIAGNOSIS — Z23 Encounter for immunization: Secondary | ICD-10-CM

## 2018-01-13 DIAGNOSIS — H918X1 Other specified hearing loss, right ear: Secondary | ICD-10-CM | POA: Diagnosis not present

## 2018-01-13 DIAGNOSIS — M5412 Radiculopathy, cervical region: Secondary | ICD-10-CM

## 2018-01-13 MED ORDER — PREDNISONE 10 MG PO TABS: ORAL_TABLET | ORAL | 0 refills | Status: DC

## 2018-01-13 NOTE — Patient Instructions (Addendum)
Helen Mccormick,   Thank you for coming in to clinic today.  1. Discuss ear/neck pain with Dr. Sharlet Salina in about 2 weeks.  This pain may be related to your neck degeneration and radiating neck pain.  2. Will hear from ENT within about 4-7 days. Because of your hearing changes, I am sending you to ENT.  3. Take prednisone taper 10 mg tablets Day 1 (Today): Take 6 pills at one time Day 2: Take 6 pills  Day 3: Take 5 pills Day 4: Take 4 pills Day 5: Take 3 pills Day 6: Take 2 pills Day 7: Take 1 pill then stop.   Please schedule a follow-up appointment with Cassell Smiles, AGNP. Return 5-7 if symptoms worsen or fail to improve.  If you have any other questions or concerns, please feel free to call the clinic or send a message through [place]. You may also schedule an earlier appointment if necessary.  You will receive a survey after today's visit either digitally by e-mail or paper by [employer]. Your experiences and feedback matter to [name].  Please respond so we know how we are doing as we provide care for you.   Cassell Smiles, DNP, AGNP-BC Adult Gerontology Nurse Practitioner [place]

## 2018-01-13 NOTE — Progress Notes (Signed)
Subjective:    Patient ID: Helen Mccormick, female    DOB: [birth year], 77 y.o.   MRN: [id number]  Helen Mccormick is a 77 y.o. female presenting on 01/13/2018 for Ear Pain (Right ear pain that radiates down in the neck and shoulder x 3 days. Pain worsen at bedtime.)   HPI R ear/neck pain Pain in neck makes her hearing decrease in R ear.  Makes it difficult to sleep with pain.  Daytime is aching intermittently.  Ache worsens in intensity at bedtime and is constant. Pt denies fever, chills, sweats, nausea, vomiting, diarrhea and constipation.   Social History   Tobacco Use  . Smoking status: Never Smoker  . Smokeless tobacco: Never Used  Substance Use Topics  . Alcohol use: No  . Drug use: No    Review of Systems Per HPI unless specifically indicated above     Objective:    BP 139/64   Pulse 79   Temp 97.9 F (36.6 C) (Oral)   Ht 5\' 4"  (1.626 m)   Wt 168 lb 3.2 oz (76.3 kg)   BMI 28.87 kg/m   Wt Readings from Last 3 Encounters:  01/13/18 168 lb 3.2 oz (76.3 kg)  12/28/17 165 lb 3.2 oz (74.9 kg)  11/30/17 164 lb 12.8 oz (74.8 kg)    Physical Exam  Constitutional: She is oriented to person, place, and time. She appears well-developed and well-nourished. No distress.  HENT:  Head: Normocephalic and atraumatic.  Right Ear: External ear and ear canal normal.  Left Ear: External ear and ear canal normal.  Nose: Nose normal. Right sinus exhibits no maxillary sinus tenderness and no frontal sinus tenderness. Left sinus exhibits no maxillary sinus tenderness and no frontal sinus tenderness.  Mouth/Throat: Uvula is midline, oropharynx is clear and moist and mucous membranes are normal. Tonsils are 0 on the right. Tonsils are 0 on the left.  Cardiovascular: Normal rate, regular rhythm, S1 normal, S2 normal, normal heart sounds and intact distal pulses.  Pulmonary/Chest: Effort normal and breath sounds normal. No respiratory distress.  Neurological: She is alert and oriented  to person, place, and time.  Skin: Skin is warm and dry.  Psychiatric: She has a normal mood and affect. Her behavior is normal.  Vitals reviewed.  Weber - no hearing Rinne - decr BC R, dec AC R/ AC>BC L Results for orders placed or performed in visit on 11/30/17  POCT glycosylated hemoglobin (Hb A1C)  Result Value Ref Range   Hemoglobin A1C 6.8 (A) 4.0 - 5.6 %   HbA1c POC (<> result, manual entry)     HbA1c, POC (prediabetic range)     HbA1c, POC (controlled diabetic range)        Assessment & Plan:   Problem List Items Addressed This Visit      Nervous and Auditory   Cervical radiculitis   Relevant Medications   tiZANidine (ZANAFLEX) 2 MG tablet   predniSONE (DELTASONE) 10 MG tablet   Other Relevant Orders   Ambulatory referral to ENT    Other Visit Diagnoses    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 - Start prednisone taper over 7 days Day 1-2: 60 mg, Day 3: 50 mg, Day 4: 40 mg; Day 5: 30 mg; Day 6 20 mg; Day 7: 10 mg then stop. - Referral ENT. - Follow-up prn if no improvement and unable to see ENT.   # Pt > age 8.  Needs annual influenza vaccine.  Plan: 1. Administer high dose fluzone today.    Meds ordered this encounter  Medications  . predniSONE (DELTASONE) 10 MG tablet    Sig: Day 1-2 take 6 pills. Day 3 take 5 pills then reduce by 1 pill each day.    Dispense:  27 tablet    Refill:  0    Order Specific Question:   Supervising Provider    Answer:   Olin Hauser [[id number]]    Follow up plan: Return 5-7 if symptoms worsen or fail to improve.  Cassell Smiles, DNP,  AGPCNP-BC Adult Gerontology Primary Care Nurse Practitioner [facility] 01/13/2018, 4:21 PM

## 2018-01-21 ENCOUNTER — Other Ambulatory Visit: Payer: Self-pay | Admitting: Nurse Practitioner

## 2018-01-21 DIAGNOSIS — H918X1 Other specified hearing loss, right ear: Secondary | ICD-10-CM

## 2018-01-21 DIAGNOSIS — M5412 Radiculopathy, cervical region: Secondary | ICD-10-CM

## 2018-01-29 ENCOUNTER — Other Ambulatory Visit: Payer: Self-pay | Admitting: Nurse Practitioner

## 2018-01-29 DIAGNOSIS — E890 Postprocedural hypothyroidism: Secondary | ICD-10-CM

## 2018-02-08 ENCOUNTER — Other Ambulatory Visit: Payer: Self-pay | Admitting: Nurse Practitioner

## 2018-02-08 ENCOUNTER — Other Ambulatory Visit: Payer: Self-pay | Admitting: Internal Medicine

## 2018-02-16 ENCOUNTER — Other Ambulatory Visit: Payer: Self-pay | Admitting: Nurse Practitioner

## 2018-02-16 DIAGNOSIS — E1169 Type 2 diabetes mellitus with other specified complication: Secondary | ICD-10-CM

## 2018-02-16 DIAGNOSIS — E785 Hyperlipidemia, unspecified: Principal | ICD-10-CM

## 2018-02-16 LAB — COMPLETE METABOLIC PANEL WITH GFR
AG Ratio: 1.5 (calc) (ref 1.0–2.5)
ALT: 15 U/L (ref 6–29)
AST: 19 U/L (ref 10–35)
Albumin: 4.1 g/dL (ref 3.6–5.1)
Alkaline phosphatase (APISO): 106 U/L (ref 33–130)
BUN: 20 mg/dL (ref 7–25)
CO2: 29 mmol/L (ref 20–32)
Calcium: 9.4 mg/dL (ref 8.6–10.4)
Chloride: 103 mmol/L (ref 98–110)
Creat: 0.8 mg/dL (ref 0.60–0.93)
GFR, Est African American: 84 mL/min/{1.73_m2} (ref >=60)
GFR, Est Non African American: 72 mL/min/{1.73_m2} (ref >=60)
Globulin: 2.8 g/dL (calc) (ref 1.9–3.7)
Glucose, Bld: 89 mg/dL (ref 65–99)
Potassium: 4.5 mmol/L (ref 3.5–5.3)
Sodium: 141 mmol/L (ref 135–146)
Total Bilirubin: 0.4 mg/dL (ref 0.2–1.2)
Total Protein: 6.9 g/dL (ref 6.1–8.1)

## 2018-02-16 LAB — LIPID PANEL
Cholesterol: 186 mg/dL (ref <200)
HDL: 47 mg/dL — ABNORMAL LOW (ref >50)
LDL Cholesterol (Calc): 123 mg/dL (calc) — ABNORMAL HIGH
Non-HDL Cholesterol (Calc): 139 mg/dL (calc) — ABNORMAL HIGH (ref <130)
Total CHOL/HDL Ratio: 4 (calc) (ref <5.0)
Triglycerides: 72 mg/dL (ref <150)

## 2018-02-16 LAB — SPECIMEN COMPROMISED

## 2018-02-16 MED ORDER — ATORVASTATIN CALCIUM 40 MG PO TABS: 40.0000 mg | ORAL_TABLET | Freq: Every day | ORAL | 1 refills | Status: DC

## 2018-02-23 ENCOUNTER — Other Ambulatory Visit: Payer: Self-pay | Admitting: Nurse Practitioner

## 2018-02-23 DIAGNOSIS — F419 Anxiety disorder, unspecified: Secondary | ICD-10-CM

## 2018-02-27 ENCOUNTER — Encounter: Payer: Self-pay | Admitting: Nurse Practitioner

## 2018-03-01 ENCOUNTER — Ambulatory Visit: Payer: Self-pay | Admitting: Nurse Practitioner

## 2018-03-06 ENCOUNTER — Other Ambulatory Visit: Payer: Self-pay | Admitting: Nurse Practitioner

## 2018-03-06 DIAGNOSIS — E890 Postprocedural hypothyroidism: Secondary | ICD-10-CM

## 2018-03-09 ENCOUNTER — Other Ambulatory Visit: Payer: Self-pay | Admitting: Nurse Practitioner

## 2018-03-09 DIAGNOSIS — E890 Postprocedural hypothyroidism: Secondary | ICD-10-CM

## 2018-03-17 ENCOUNTER — Encounter: Payer: Self-pay | Admitting: Nurse Practitioner

## 2018-03-17 ENCOUNTER — Other Ambulatory Visit: Payer: Self-pay

## 2018-03-17 ENCOUNTER — Ambulatory Visit (INDEPENDENT_AMBULATORY_CARE_PROVIDER_SITE_OTHER): Payer: Medicare Other | Admitting: Nurse Practitioner

## 2018-03-17 VITALS — BP 123/58 | HR 66 | Temp 98.0°F | Ht 64.0 in | Wt 169.0 lb

## 2018-03-17 DIAGNOSIS — E890 Postprocedural hypothyroidism: Secondary | ICD-10-CM | POA: Diagnosis not present

## 2018-03-17 DIAGNOSIS — E1169 Type 2 diabetes mellitus with other specified complication: Secondary | ICD-10-CM

## 2018-03-17 DIAGNOSIS — E785 Hyperlipidemia, unspecified: Secondary | ICD-10-CM | POA: Diagnosis not present

## 2018-03-17 DIAGNOSIS — Z23 Encounter for immunization: Secondary | ICD-10-CM | POA: Diagnosis not present

## 2018-03-17 LAB — POCT GLYCOSYLATED HEMOGLOBIN (HGB A1C): Hemoglobin A1C: 6.6 % — AB (ref 4.0–5.6)

## 2018-03-17 LAB — POCT UA - MICROALBUMIN: Microalbumin Ur, POC: 0 mg/L

## 2018-03-17 NOTE — Patient Instructions (Addendum)
Helen Mccormick,   Thank you for coming in to clinic today.  1. Continue all current medications without changes. 2. Keep working on eating a healthy diet and staying active!  Great work to keep your diabetes controlled!  Please schedule a follow-up appointment with Cassell Smiles, AGNP. Return in about 3 months (around 06/16/2018) for diabetes.  If you have any other questions or concerns, please feel free to call the clinic or send a message through [place]. You may also schedule an earlier appointment if necessary.  You will receive a survey after today's visit either digitally by e-mail or paper by [employer]. Your experiences and feedback matter to [name].  Please respond so we know how we are doing as we provide care for you.   Cassell Smiles, DNP, AGNP-BC Adult Gerontology Nurse Practitioner [place]

## 2018-03-17 NOTE — Progress Notes (Signed)
Subjective:    Patient ID: Helen Mccormick, female    DOB: Jan 01, 1941, 77 y.o.   MRN: [id number]  Helen Mccormick is a 77 y.o. female presenting on 03/17/2018 for Diabetes   HPI Diabetes Pt presents today for follow up of Type 2 diabetes mellitus. She is checking fasting am CBG at home with a range of 114-120s usually - Current diabetic medications include: metformin 500 mg twice daily - She is not currently symptomatic.  - She denies polydipsia, polyphagia, polyuria, headaches, diaphoresis, shakiness, chills, pain, numbness or tingling in extremities and changes in vision.   - Clinical course has been stable. - She  reports an exercise routine that includes walking/aerobics, 4 days per week. - Her diet is moderate in salt, moderate in fat, and moderate in carbohydrates. - Weight trend: stable  PREVENTION: Eye exam current (within one year): yes Foot exam current (within one year): yes Lipid/ASCVD risk reduction - on statin: yes Kidney protection - on ace or arb: no - needs microalbumin Recent Labs    06/11/17 1620 11/30/17 1705 03/17/18 1117  HGBA1C 7.1 6.8* 6.6*     Hypothyroidism - Pt states she is taking her levothyroxine 75 mcg in the am at least 1 hour before eating or drinking and taking other medicines.   - She is not currently symptomatic. - She denies, fatigue, excess energy, weight changes, heart racing, heart palpitations, heat and cold intolerance, changes in hair/skin/nails, and lower leg swelling.  - She does not have any compressive symptoms to include difficulty swallowing, globus sensation, or difficulty breathing when lying flat.   Social History   Tobacco Use  . Smoking status: Never Smoker  . Smokeless tobacco: Never Used  Substance Use Topics  . Alcohol use: No  . Drug use: No    Review of Systems Per HPI unless specifically indicated above     Objective:    BP (!) 123/58 (BP Location: Right Arm, Patient Position: Sitting, Cuff Size:  Normal)   Pulse 66   Temp 98 F (36.7 C) (Oral)   Ht 5\' 4"  (1.626 m)   Wt 169 lb (76.7 kg)   BMI 29.01 kg/m   Wt Readings from Last 3 Encounters:  03/17/18 169 lb (76.7 kg)  01/13/18 168 lb 3.2 oz (76.3 kg)  12/28/17 165 lb 3.2 oz (74.9 kg)    Physical Exam Vitals signs reviewed.  Constitutional:      General: She is not in acute distress.    Appearance: She is well-developed.  HENT:     Head: Normocephalic and atraumatic.  Cardiovascular:     Rate and Rhythm: Normal rate and regular rhythm.     Pulses:          Radial pulses are 2+ on the right side and 2+ on the left side.       Posterior tibial pulses are 1+ on the right side and 1+ on the left side.     Heart sounds: Normal heart sounds, S1 normal and S2 normal.  Pulmonary:     Effort: Pulmonary effort is normal. No respiratory distress.     Breath sounds: Normal breath sounds and air entry.  Musculoskeletal:     Right lower leg: No edema.     Left lower leg: No edema.  Skin:    General: Skin is warm and dry.     Capillary Refill: Capillary refill takes less than 2 seconds.  Neurological:     Mental Status: She is alert  and oriented to person, place, and time.  Psychiatric:        Attention and Perception: Attention normal.        Mood and Affect: Mood and affect normal.        Behavior: Behavior normal. Behavior is cooperative.     Results for orders placed or performed in visit on 11/30/17  COMPLETE METABOLIC PANEL WITH GFR  Result Value Ref Range   Glucose, Bld 89 65 - 99 mg/dL   BUN 20 7 - 25 mg/dL   Creat 0.80 0.60 - 0.93 mg/dL   GFR, Est Non African American 72 > OR = 60 mL/min/1.67m2   GFR, Est African American 84 > OR = 60 mL/min/1.5m2   BUN/Creatinine Ratio NOT APPLICABLE 6 - 22 (calc)   Sodium 141 135 - 146 mmol/L   Potassium 4.5 3.5 - 5.3 mmol/L   Chloride 103 98 - 110 mmol/L   CO2 29 20 - 32 mmol/L   Calcium 9.4 8.6 - 10.4 mg/dL   Total Protein 6.9 6.1 - 8.1 g/dL   Albumin 4.1 3.6 - 5.1 g/dL    Globulin 2.8 1.9 - 3.7 g/dL (calc)   AG Ratio 1.5 1.0 - 2.5 (calc)   Total Bilirubin 0.4 0.2 - 1.2 mg/dL   Alkaline phosphatase (APISO) 106 33 - 130 U/L   AST 19 10 - 35 U/L   ALT 15 6 - 29 U/L  Lipid panel  Result Value Ref Range   Cholesterol 186 <200 mg/dL   HDL 47 (L) >50 mg/dL   Triglycerides 72 <150 mg/dL   LDL Cholesterol (Calc) 123 (H) mg/dL (calc)   Total CHOL/HDL Ratio 4.0 <5.0 (calc)   Non-HDL Cholesterol (Calc) 139 (H) <130 mg/dL (calc)  SPECIMEN COMPROMISED  Result Value Ref Range   Specimen Integrity    POCT glycosylated hemoglobin (Hb A1C)  Result Value Ref Range   Hemoglobin A1C 6.8 (A) 4.0 - 5.6 %   HbA1c POC (<> result, manual entry)     HbA1c, POC (prediabetic range)     HbA1c, POC (controlled diabetic range)        Assessment & Plan:   Problem List Items Addressed This Visit      Endocrine   DM (diabetes mellitus), type 2 ([place])    ControlledDM with A1c 6.6% improved from 6.8% and goal A1c < 3.4%. - Known complications of hyperlipidemia  Plan:  1. Change therapy:  - Continue metformin XR 500 mg once daily. 2. Encourage improved lifestyle: - low carb/low glycemic diet reinforced prior education - Increase physical activity to 30 minutes most days of the week.  Explained that increased physical activity increases body's use of sugar for energy. 3. Check fasting am CBG and bring log to next visit for review 4. Continue ASA and Statin 5. Foot and ophthalmology exams are up to date. 6. Follow-up 3 months       Relevant Orders   POCT UA - Microalbumin (Completed)   Hypothyroidism, postsurgical    Pt w/ daily levothyroxine s/p thyroidectomy several years ago. Pt reports she has had this stable dose of levothyroxine 75 mcg for several years.  Last labs WNL.  Plan: 1. Continue levothyroxine once daily. 2. Recheck TSH today. 3. Followup 3 months.      Hyperlipidemia due to type 2 diabetes mellitus ([place]) - Primary    Stable at last labs.  Patient  requests refill of statin.  Refill provided.  Follow-up 3-6 months.      Relevant  Medications   hydrochlorothiazide (HYDRODIURIL) 12.5 MG tablet   Other Relevant Orders   POCT glycosylated hemoglobin (Hb A1C) (Completed)    Other Visit Diagnoses    Need for Streptococcus pneumoniae vaccination       Relevant Orders   Pneumococcal conjugate vaccine 13-valent (Completed)      No orders of the defined types were placed in this encounter.   Follow up plan: No follow-ups on file.  Cassell Smiles, DNP, AGPCNP-BC Adult Gerontology Primary Care Nurse Practitioner [facility] 03/17/2018, 11:15 AM

## 2018-03-18 LAB — CBC WITH DIFFERENTIAL/PLATELET
Absolute Monocytes: 510 cells/uL (ref 200–950)
Basophils Absolute: 8 cells/uL (ref 0–200)
Basophils Relative: 0.1 %
Eosinophils Absolute: 173 cells/uL (ref 15–500)
Eosinophils Relative: 2.3 %
HCT: 38 % (ref 35.0–45.0)
Hemoglobin: 12.1 g/dL (ref 11.7–15.5)
Lymphs Abs: 2453 cells/uL (ref 850–3900)
MCH: 23.8 pg — ABNORMAL LOW (ref 27.0–33.0)
MCHC: 31.8 g/dL — ABNORMAL LOW (ref 32.0–36.0)
MCV: 74.8 fL — ABNORMAL LOW (ref 80.0–100.0)
MPV: 12.3 fL (ref 7.5–12.5)
Monocytes Relative: 6.8 %
Neutro Abs: 4358 cells/uL (ref 1500–7800)
Neutrophils Relative %: 58.1 %
Platelets: 242 10*3/uL (ref 140–400)
RBC: 5.08 10*6/uL (ref 3.80–5.10)
RDW: 15.3 % — ABNORMAL HIGH (ref 11.0–15.0)
Total Lymphocyte: 32.7 %
WBC: 7.5 10*3/uL (ref 3.8–10.8)

## 2018-03-18 LAB — IRON,TIBC AND FERRITIN PANEL
%SAT: 22 % (calc) (ref 16–45)
Ferritin: 89 ng/mL (ref 16–288)
Iron: 66 ug/dL (ref 45–160)
TIBC: 306 mcg/dL (calc) (ref 250–450)

## 2018-03-18 LAB — TSH+FREE T4: TSH W/REFLEX TO FT4: 0.28 mIU/L — ABNORMAL LOW (ref 0.40–4.50)

## 2018-03-18 LAB — T4, FREE: Free T4: 1.4 ng/dL (ref 0.8–1.8)

## 2018-03-22 ENCOUNTER — Encounter: Payer: Self-pay | Admitting: Nurse Practitioner

## 2018-03-22 NOTE — Assessment & Plan Note (Signed)
Pt w/ daily levothyroxine s/p thyroidectomy several years ago. Pt reports she has had this stable dose of levothyroxine 75 mcg for several years.  Last labs WNL.  Plan: 1. Continue levothyroxine once daily. 2. Recheck TSH today. 3. Followup 3 months.

## 2018-03-22 NOTE — Assessment & Plan Note (Signed)
Stable at last labs.  Patient requests refill of statin.  Refill provided.  Follow-up 3-6 months.

## 2018-03-22 NOTE — Assessment & Plan Note (Signed)
ControlledDM with A1c 6.6% improved from 6.8% and goal A1c < 0.3%. - Known complications of hyperlipidemia  Plan:  1. Change therapy:  - Continue metformin XR 500 mg once daily. 2. Encourage improved lifestyle: - low carb/low glycemic diet reinforced prior education - Increase physical activity to 30 minutes most days of the week.  Explained that increased physical activity increases body's use of sugar for energy. 3. Check fasting am CBG and bring log to next visit for review 4. Continue ASA and Statin 5. Foot and ophthalmology exams are up to date. 6. Follow-up 3 months

## 2018-03-29 ENCOUNTER — Other Ambulatory Visit: Payer: Self-pay | Admitting: Nurse Practitioner

## 2018-03-29 DIAGNOSIS — K219 Gastro-esophageal reflux disease without esophagitis: Secondary | ICD-10-CM

## 2018-03-30 DIAGNOSIS — K2970 Gastritis, unspecified, without bleeding: Secondary | ICD-10-CM

## 2018-03-30 DIAGNOSIS — D509 Iron deficiency anemia, unspecified: Secondary | ICD-10-CM

## 2018-03-30 HISTORY — DX: Iron deficiency anemia, unspecified: D50.9

## 2018-03-30 HISTORY — DX: Gastritis, unspecified, without bleeding: K29.70

## 2018-04-05 ENCOUNTER — Other Ambulatory Visit: Payer: Self-pay | Admitting: Nurse Practitioner

## 2018-04-05 DIAGNOSIS — E890 Postprocedural hypothyroidism: Secondary | ICD-10-CM

## 2018-04-14 ENCOUNTER — Other Ambulatory Visit: Payer: Self-pay | Admitting: Nurse Practitioner

## 2018-04-14 DIAGNOSIS — E1169 Type 2 diabetes mellitus with other specified complication: Secondary | ICD-10-CM

## 2018-04-14 DIAGNOSIS — E785 Hyperlipidemia, unspecified: Principal | ICD-10-CM

## 2018-04-14 MED ORDER — ATORVASTATIN CALCIUM 40 MG PO TABS: 40.0000 mg | ORAL_TABLET | Freq: Every day | ORAL | 1 refills | Status: DC

## 2018-05-03 ENCOUNTER — Ambulatory Visit (INDEPENDENT_AMBULATORY_CARE_PROVIDER_SITE_OTHER): Payer: Medicare Other

## 2018-05-03 VITALS — BP 110/62 | HR 74 | Temp 98.1°F | Resp 16 | Ht 64.0 in | Wt 169.4 lb

## 2018-05-03 DIAGNOSIS — Z0000 Encounter for general adult medical examination without abnormal findings: Secondary | ICD-10-CM

## 2018-05-03 NOTE — Progress Notes (Signed)
Subjective:   Helen Mccormick is a 78 y.o. female who presents for Medicare Annual (Subsequent) preventive examination.  Review of Systems:  Cardiac Risk Factors include: advanced age (>3men, >110 women);diabetes mellitus;hypertension;dyslipidemia     Objective:     Vitals: BP 110/62 (BP Location: Left Arm, Patient Position: Sitting, Cuff Size: Normal)   Pulse 74   Temp 98.1 F (36.7 C) (Oral)   Resp 16   Ht 5\' 4"  (1.626 m)   Wt 169 lb 6.4 oz (76.8 kg)   BMI 29.08 kg/m   Body mass index is 29.08 kg/m.  Advanced Directives 05/03/2018 08/15/2017 05/31/2017 05/28/2017 05/02/2017 04/27/2017 03/02/2017  Does Patient Have a Medical Advance Directive? No No No No No No No  Does patient want to make changes to medical advance directive? - - - - - Yes (MAU/Ambulatory/Procedural Areas - Information given) -  Would patient like information on creating a medical advance directive? Yes (MAU/Ambulatory/Procedural Areas - Information given) No - Patient declined - - - - No - Patient declined    Tobacco Social History   Tobacco Use  Smoking Status Never Smoker  Smokeless Tobacco Never Used     Counseling given: Not Answered   Clinical Intake:  Pre-visit preparation completed: Yes  Pain : No/denies pain     Nutrition Risk Assessment:  Has the patient had any N/V/D within the last 2 months?  No  Does the patient have any non-healing wounds?  No  Has the patient had any unintentional weight loss or weight gain?  No   Diabetes:  Is the patient diabetic?  Yes  If diabetic, was a CBG obtained today?  No  Did the patient bring in their glucometer from home?  No  How often do you monitor your CBG's? 5 days a week or as needed  Financial Strains and Diabetes Management:  Are you having any financial strains with the device, your supplies or your medication? No .   Diabetic Exams:  Diabetic Eye Exam: Completed 09/23/2017  Diabetic Foot Exam: Completed 06/11/2017. Pt has been advised  about the importance in completing this exam.   How often do you need to have someone help you when you read instructions, pamphlets, or other written materials from your doctor or pharmacy?: 1 - Never What is the last grade level you completed in school?: 10th grade  Interpreter Needed?: No  Information entered by ::  ,LPN  Past Medical History:  Diagnosis Date  . Allergy   . Anxiety   . Asthma   . Colon polyp   . Depression   . Diabetes mellitus without complication ([place])   . GERD (gastroesophageal reflux disease)   . Heart murmur   . Hyperlipidemia   . Hypertension   . Sleep apnea   . Thyroid disease    Past Surgical History:  Procedure Laterality Date  . ABDOMINAL HYSTERECTOMY    . BACK SURGERY    . BACK SURGERY    . CERVICAL FUSION    . COLONOSCOPY WITH PROPOFOL N/A 05/28/2017   Procedure: COLONOSCOPY WITH PROPOFOL;  Surgeon: Jonathon Bellows, MD;  Location: [facility] END[facility]COPY;  Service: Gastroenterology;  Laterality: N/A;  . NECK SURGERY    . THYROID SURGERY    . THYROID SURGERY     Family History  Problem Relation Age of Onset  . Breast cancer Maternal Aunt 60  . Diabetes Mother   . Leukemia Mother   . Diabetes Sister   . Mental illness Sister   .  Colon polyps Sister   . Stroke Father   . Cancer Brother        rectal  . Healthy Daughter   . Hypertension Son   . Diabetes Sister   . Thyroid disease Sister   . Healthy Sister   . Healthy Sister   . Stroke Brother   . Healthy Son   . Healthy Son   . Healthy Daughter    Social History   Socioeconomic History  . Marital status: Single    Spouse name: Not on file  . Number of children: Not on file  . Years of education: Not on file  . Highest education level: 10th grade  Occupational History  . Occupation: retired  Scientific laboratory technician  . Financial resource strain: Not hard at all  . Food insecurity:    Worry: Never true    Inability: Never true  . Transportation needs:    Medical: No    Non-medical:  No  Tobacco Use  . Smoking status: Never Smoker  . Smokeless tobacco: Never Used  Substance and Sexual Activity  . Alcohol use: No  . Drug use: No  . Sexual activity: Not Currently  Lifestyle  . Physical activity:    Days per week: 0 days    Minutes per session: 0 min  . Stress: Only a little  Relationships  . Social connections:    Talks on phone: More than three times a week    Gets together: Once a week    Attends religious service: More than 4 times per year    Active member of club or organization: Yes    Attends meetings of clubs or organizations: More than 4 times per year    Relationship status: Never married  Other Topics Concern  . Not on file  Social History Narrative  . Not on file    Outpatient Encounter Medications as of 05/03/2018  Medication Sig  . albuterol (PROVENTIL HFA;VENTOLIN HFA) 108 (90 Base) MCG/ACT inhaler Inhale into the lungs every 6 (six) hours as needed for wheezing or shortness of breath.  Marland Kitchen amLODipine (NORVASC) 5 MG tablet TAKE 1 TABLET(5 MG) BY MOUTH DAILY  . aspirin 81 MG chewable tablet Chew 81 mg by mouth daily.   Marland Kitchen atorvastatin (LIPITOR) 40 MG tablet Take 1 tablet (40 mg total) by mouth at bedtime.  . calcium carbonate (CALCIUM 600) 600 MG TABS tablet Take 600 mg by mouth 2 (two) times daily with a meal.  . escitalopram (LEXAPRO) 10 MG tablet TAKE 1 TABLET BY MOUTH EVERY DAY FOR DEPRESSION  . ferrous sulfate 325 (65 FE) MG tablet Take 325 mg by mouth daily with breakfast.  . Flaxseed, Linseed, (FLAX SEED OIL) 1000 MG CAPS Take by mouth.  . fluticasone (FLONASE) 50 MCG/ACT nasal spray Place 2 sprays into both nostrils daily.  Marland Kitchen glucose blood test strip Use 1 strip to check your blood sugar two times daily.  . hydrochlorothiazide (HYDRODIURIL) 12.5 MG tablet   . levothyroxine (SYNTHROID, LEVOTHROID) 75 MCG tablet TAKE 1 TABLET BY MOUTH DAILY BEFORE BREAKFAST  . meloxicam (MOBIC) 15 MG tablet meloxicam 15 mg tablet  Take 1 tablet every day by  oral route.  . metFORMIN (GLUCOPHAGE-XR) 500 MG 24 hr tablet Take 1 tablet (500 mg total) by mouth 2 (two) times daily with a meal.  . Multiple Vitamins-Minerals (PRESERVISION AREDS 2 PO) Take by mouth.  . Omega-3 1000 MG CAPS Take 1 capsule by mouth daily.   Marland Kitchen omeprazole (PRILOSEC) 40  MG capsule TAKE 1 CAPSULE(40 MG) BY MOUTH DAILY  . pyridOXINE (VITAMIN B-6) 100 MG tablet Take 100 mg by mouth daily.  . Ascorbic Acid (VITAMIN C) 1000 MG tablet Take 1,000 mg by mouth daily.  . cholecalciferol (VITAMIN D) 1000 units tablet Take 1 tablet by mouth daily.   Marland Kitchen dicyclomine (BENTYL) 10 MG capsule Take 1 capsule three times daily with meals as needed for abdominal pain or cramping with diarrhea. (Patient not taking: Reported on 05/03/2018)  . Fluocinolone Acetonide Body 0.01 % OIL Apply 1-2x daily for flares of itching of the scalp  . isosorbide mononitrate (IMDUR) 30 MG 24 hr tablet Take 1 tablet (30 mg total) by mouth daily.  . methocarbamol (ROBAXIN) 500 MG tablet methocarbamol 500 mg tablet  TAKE 1 TABLET BY MOUTH TWICE DAILY AS NEEDED  . nitroGLYCERIN (NITROSTAT) 0.4 MG SL tablet Place 1 tablet (0.4 mg total) under the tongue every 5 (five) minutes as needed for chest pain. (Patient not taking: Reported on 03/17/2018)  . pantoprazole (PROTONIX) 40 MG tablet TK 1 T PO ONCE A DAY  . predniSONE (DELTASONE) 10 MG tablet Day 1-2 take 6 pills. Day 3 take 5 pills then reduce by 1 pill each day. (Patient not taking: Reported on 03/17/2018)  . sucralfate (CARAFATE) 1 g tablet TK 1 T PO QID  . tiZANidine (ZANAFLEX) 2 MG tablet    No facility-administered encounter medications on file as of 05/03/2018.     Activities of Daily Living In your present state of health, do you have any difficulty performing the following activities: 05/03/2018 08/15/2017  Hearing? N N  Vision? N N  Difficulty concentrating or making decisions? N Y  Walking or climbing stairs? N N  Dressing or bathing? N N  Doing errands,  shopping? N N  Preparing Food and eating ? N -  Using the Toilet? N -  In the past six months, have you accidently leaked urine? N -  Do you have problems with loss of bowel control? N -  Managing your Medications? N -  Managing your Finances? N -  Housekeeping or managing your Housekeeping? N -  Some recent data might be hidden    Patient Care Team: [name], NP as PCP - General (Nurse Practitioner) Sharlet Salina, MD as Referring Physician (Physical Medicine and Rehabilitation)    Assessment:   This is a routine wellness examination for [place].  Exercise Activities and Dietary recommendations Current Exercise Habits: The patient does not participate in regular exercise at present, Exercise limited by: None identified  Goals    . DIET - INCREASE WATER INTAKE     Recommend drinking at least 6-8 glasses of water a day        Fall Risk Fall Risk  05/03/2018 04/27/2017 03/12/2017  Falls in the past year? 0 No No  Number falls in past yr: 0 - -   FALL RISK PREVENTION PERTAINING TO THE HOME:  Any stairs in or around the home WITH handrails? No stairs  Home free of loose throw rugs in walkways, pet beds, electrical cords, etc? yes Adequate lighting in your home to reduce risk of falls? Yes   ASSISTIVE DEVICES UTILIZED TO PREVENT FALLS:  Life alert? No  Use of a cane, walker or w/c? No  Grab bars in the bathroom? No  Shower chair or bench in shower? No  Elevated toilet seat or a handicapped toilet? No   DME ORDERS:  DME order needed?  No  TIMED UP AND GO:  Was the test performed? No .     GAIT:  Appearance of gait: Gait stead-fast without the use of an assistive device.  Education: Fall risk prevention has been discussed.  Intervention(s) required? No    Depression Screen PHQ 2/9 Scores 05/03/2018 04/27/2017 03/12/2017  PHQ - 2 Score 0 0 4  PHQ- 9 Score - - 9     Cognitive Function     6CIT Screen 05/03/2018 04/27/2017  What Year? 0 points 0  points  What month? 0 points 0 points  What time? 0 points 0 points  Count back from 20 0 points 0 points  Months in reverse 0 points 0 points  Repeat phrase 2 points 4 points  Total Score 2 4    Immunization History  Administered Date(s) Administered  . Influenza, High Dose Seasonal PF 04/27/2017, 01/13/2018  . Pneumococcal Conjugate-13 03/17/2018    Qualifies for Shingles Vaccine?Yes   Due for Shingrix. Education has been provided regarding the importance of this vaccine. Pt has been advised to call insurance company to determine out of pocket expense. Advised may also receive vaccine at local pharmacy or Health Dept. Verbalized acceptance and understanding.  Tdap: Although this vaccine is not a covered service during a Wellness Exam, does the patient still wish to receive this vaccine today?  No .  Education has been provided regarding the importance of this vaccine. Advised may receive this vaccine at local pharmacy or Health Dept. Aware to provide a copy of the vaccination record if obtained from local pharmacy or Health Dept. Verbalized acceptance and understanding.  Flu Vaccine: up to date  Pneumococcal Vaccine: Due for Pneumococcal vaccine 02/2019   Screening Tests Health Maintenance  Topic Date Due  . TETANUS/TDAP  09/03/1959  . FOOT EXAM  06/12/2018  . HEMOGLOBIN A1C  09/16/2018  . OPHTHALMOLOGY EXAM  09/24/2018  . URINE MICROALBUMIN  03/18/2019  . PNA vac Low Risk Adult (2 of 2 - PPSV23) 03/18/2019  . COLONOSCOPY  05/28/2020  . INFLUENZA VACCINE  Completed  . DEXA SCAN  Completed    Cancer Screenings:  Colorectal Screening: Completed 05/28/2017. Repeat every 3 years;   Mammogram: Completed 12/13/2017. Repeat every year  Bone Density: Completed 07/21/2006   Lung Cancer Screening: (Low Dose CT Chest recommended if Age 76-80 years, 30 pack-year currently smoking OR have quit w/in 15years.) does not qualify.    Additional Screening:  Hepatitis C Screening: does  not qualify   Dental Screening: Recommended annual dental exams for proper oral hygiene  Community Resource Referral:  CRR required this visit?  No      Plan:    I have personally reviewed and addressed the Medicare Annual Wellness questionnaire and have noted the following in the patient's chart:  A. Medical and social history B. Use of alcohol, tobacco or illicit drugs  C. Current medications and supplements D. Functional ability and status E.  Nutritional status F.  Physical activity G. Advance directives H. List of other physicians I.  Hospitalizations, surgeries, and ER visits in previous 12 months J.  [place] such as hearing and vision if needed, cognitive and depression L. Referrals and appointments   In addition, I have reviewed and discussed with patient certain preventive protocols, quality metrics, and best practice recommendations. A written personalized care plan for preventive services as well as general preventive health recommendations were provided to patient.   Signed,  Tyler Aas, LPN Nurse Health Advisor   Nurse  Notes:none

## 2018-05-03 NOTE — Patient Instructions (Signed)
Helen Mccormick , Thank you for taking time to come for your Medicare Wellness Visit. I appreciate your ongoing commitment to your health goals. Please review the following plan we discussed and let me know if I can assist you in the future.   Screening recommendations/referrals: Colonoscopy: completed 05/28/2017 Mammogram: completed 09/12/2017 Bone Density: completed 05/22/2006 Recommended yearly ophthalmology/optometry visit for glaucoma screening and checkup Recommended yearly dental visit for hygiene and checkup  Vaccinations: Influenza vaccine: completed 10/17/209 Pneumococcal vaccine: pneumovax 23 due 02/2019 Tdap vaccine: due now, check with your insurance company for coverage information Shingles vaccine: shingrix eligible, check with your insurance company for coverage information    Advanced directives: Advance directive discussed with you today. I have provided a copy for you to complete at home and have notarized. Once this is complete please bring a copy in to our office so we can scan it into your chart.  Conditions/risks identified: none   Next appointment: Follow up in one year for your annual wellness exam.    Preventive Care 65 Years and Older, Female Preventive care refers to lifestyle choices and visits with your health care provider that can promote health and wellness. What does preventive care include?  A yearly physical exam. This is also called an annual well check.  Dental exams once or twice a year.  Routine eye exams. Ask your health care provider how often you should have your eyes checked.  Personal lifestyle choices, including:  Daily care of your teeth and gums.  Regular physical activity.  Eating a healthy diet.  Avoiding tobacco and drug use.  Limiting alcohol use.  Practicing safe sex.  Taking low-dose aspirin every day.  Taking vitamin and mineral supplements as recommended by your health care provider. What happens during an annual well  check? The services and screenings done by your health care provider during your annual well check will depend on your age, overall health, lifestyle risk factors, and family history of disease. Counseling  Your health care provider may ask you questions about your:  Alcohol use.  Tobacco use.  Drug use.  Emotional well-being.  Home and relationship well-being.  Sexual activity.  Eating habits.  History of falls.  Memory and ability to understand (cognition).  Work and work Statistician.  Reproductive health. Screening  You may have the following tests or measurements:  Height, weight, and BMI.  Blood pressure.  Lipid and cholesterol levels. These may be checked every 5 years, or more frequently if you are over 86 years old.  Skin check.  Lung cancer screening. You may have this screening every year starting at age 33 if you have a 30-pack-year history of smoking and currently smoke or have quit within the past 15 years.  Fecal occult blood test (FOBT) of the stool. You may have this test every year starting at age 27.  Flexible sigmoidoscopy or colonoscopy. You may have a sigmoidoscopy every 5 years or a colonoscopy every 10 years starting at age 76.  Hepatitis C blood test.  Hepatitis B blood test.  Sexually transmitted disease (STD) testing.  Diabetes screening. This is done by checking your blood sugar (glucose) after you have not eaten for a while (fasting). You may have this done every 1-3 years.  Bone density scan. This is done to screen for osteoporosis. You may have this done starting at age 33.  Mammogram. This may be done every 1-2 years. Talk to your health care provider about how often you should have regular mammograms.  Talk with your health care provider about your test results, treatment options, and if necessary, the need for more tests. Vaccines  Your health care provider may recommend certain vaccines, such as:  Influenza vaccine. This is  recommended every year.  Tetanus, diphtheria, and acellular pertussis (Tdap, Td) vaccine. You may need a Td booster every 10 years.  Zoster vaccine. You may need this after age 44.  Pneumococcal 13-valent conjugate (PCV13) vaccine. One dose is recommended after age 60.  Pneumococcal polysaccharide (PPSV23) vaccine. One dose is recommended after age 4. Talk to your health care provider about which screenings and vaccines you need and how often you need them. This information is not intended to replace advice given to you by your health care provider. Make sure you discuss any questions you have with your health care provider. Document Released: 04/12/2015 Document Revised: 12/04/2015 Document Reviewed: 01/15/2015 Elsevier Interactive Patient Education  2017 [employer] Prevention in the Home Falls can cause injuries. They can happen to people of all ages. There are many things you can do to make your home safe and to help prevent falls. What can I do on the outside of my home?  Regularly fix the edges of walkways and driveways and fix any cracks.  Remove anything that might make you trip as you walk through a door, such as a raised step or threshold.  Trim any bushes or trees on the path to your home.  Use bright outdoor lighting.  Clear any walking paths of anything that might make someone trip, such as rocks or tools.  Regularly check to see if handrails are loose or broken. Make sure that both sides of any steps have handrails.  Any raised decks and porches should have guardrails on the edges.  Have any leaves, snow, or ice cleared regularly.  Use sand or salt on walking paths during winter.  Clean up any spills in your garage right away. This includes oil or grease spills. What can I do in the bathroom?  Use night lights.  Install grab bars by the toilet and in the tub and shower. Do not use towel bars as grab bars.  Use non-skid mats or decals in the tub or  shower.  If you need to sit down in the shower, use a plastic, non-slip stool.  Keep the floor dry. Clean up any water that spills on the floor as soon as it happens.  Remove soap buildup in the tub or shower regularly.  Attach bath mats securely with double-sided non-slip rug tape.  Do not have throw rugs and other things on the floor that can make you trip. What can I do in the bedroom?  Use night lights.  Make sure that you have a light by your bed that is easy to reach.  Do not use any sheets or blankets that are too big for your bed. They should not hang down onto the floor.  Have a firm chair that has side arms. You can use this for support while you get dressed.  Do not have throw rugs and other things on the floor that can make you trip. What can I do in the kitchen?  Clean up any spills right away.  Avoid walking on wet floors.  Keep items that you use a lot in easy-to-reach places.  If you need to reach something above you, use a strong step stool that has a grab bar.  Keep electrical cords out of the way.  Do not use floor polish or wax that makes floors slippery. If you must use wax, use non-skid floor wax.  Do not have throw rugs and other things on the floor that can make you trip. What can I do with my stairs?  Do not leave any items on the stairs.  Make sure that there are handrails on both sides of the stairs and use them. Fix handrails that are broken or loose. Make sure that handrails are as long as the stairways.  Check any carpeting to make sure that it is firmly attached to the stairs. Fix any carpet that is loose or worn.  Avoid having throw rugs at the top or bottom of the stairs. If you do have throw rugs, attach them to the floor with carpet tape.  Make sure that you have a light switch at the top of the stairs and the bottom of the stairs. If you do not have them, ask someone to add them for you. What else can I do to help prevent  falls?  Wear shoes that:  Do not have high heels.  Have rubber bottoms.  Are comfortable and fit you well.  Are closed at the toe. Do not wear sandals.  If you use a stepladder:  Make sure that it is fully opened. Do not climb a closed stepladder.  Make sure that both sides of the stepladder are locked into place.  Ask someone to hold it for you, if possible.  Clearly mark and make sure that you can see:  Any grab bars or handrails.  First and last steps.  Where the edge of each step is.  Use tools that help you move around (mobility aids) if they are needed. These include:  Canes.  Walkers.  Scooters.  Crutches.  Turn on the lights when you go into a dark area. Replace any light bulbs as soon as they burn out.  Set up your furniture so you have a clear path. Avoid moving your furniture around.  If any of your floors are uneven, fix them.  If there are any pets around you, be aware of where they are.  Review your medicines with your doctor. Some medicines can make you feel dizzy. This can increase your chance of falling. Ask your doctor what other things that you can do to help prevent falls. This information is not intended to replace advice given to you by your health care provider. Make sure you discuss any questions you have with your health care provider. Document Released: 01/10/2009 Document Revised: 08/22/2015 Document Reviewed: 04/20/2014 Elsevier Interactive Patient Education  2017 [employer].

## 2018-05-09 ENCOUNTER — Telehealth: Payer: Self-pay | Admitting: Nurse Practitioner

## 2018-05-09 DIAGNOSIS — Z23 Encounter for immunization: Secondary | ICD-10-CM

## 2018-05-09 NOTE — Telephone Encounter (Signed)
Order is sent.

## 2018-05-09 NOTE — Telephone Encounter (Signed)
Pt stopped by today requesting prescription  For mump shot to be sent [facility]

## 2018-05-31 ENCOUNTER — Emergency Department
Admission: EM | Admit: 2018-05-31 | Discharge: 2018-05-31 | Disposition: A | Discharge status: Home / Self Care | Payer: Medicare Other | Attending: Emergency Medicine | Admitting: Emergency Medicine

## 2018-05-31 ENCOUNTER — Other Ambulatory Visit: Payer: Self-pay

## 2018-05-31 ENCOUNTER — Encounter: Payer: Self-pay | Admitting: Emergency Medicine

## 2018-05-31 ENCOUNTER — Emergency Department: Payer: Medicare Other

## 2018-05-31 DIAGNOSIS — Z7982 Long term (current) use of aspirin: Secondary | ICD-10-CM | POA: Insufficient documentation

## 2018-05-31 DIAGNOSIS — Z79899 Other long term (current) drug therapy: Secondary | ICD-10-CM | POA: Diagnosis not present

## 2018-05-31 DIAGNOSIS — Z7984 Long term (current) use of oral hypoglycemic drugs: Secondary | ICD-10-CM | POA: Diagnosis not present

## 2018-05-31 DIAGNOSIS — E039 Hypothyroidism, unspecified: Secondary | ICD-10-CM | POA: Insufficient documentation

## 2018-05-31 DIAGNOSIS — R109 Unspecified abdominal pain: Secondary | ICD-10-CM | POA: Diagnosis present

## 2018-05-31 DIAGNOSIS — F329 Major depressive disorder, single episode, unspecified: Secondary | ICD-10-CM | POA: Insufficient documentation

## 2018-05-31 DIAGNOSIS — K2900 Acute gastritis without bleeding: Secondary | ICD-10-CM | POA: Diagnosis not present

## 2018-05-31 DIAGNOSIS — E119 Type 2 diabetes mellitus without complications: Secondary | ICD-10-CM | POA: Diagnosis not present

## 2018-05-31 DIAGNOSIS — F419 Anxiety disorder, unspecified: Secondary | ICD-10-CM | POA: Diagnosis not present

## 2018-05-31 DIAGNOSIS — I10 Essential (primary) hypertension: Secondary | ICD-10-CM | POA: Insufficient documentation

## 2018-05-31 LAB — COMPREHENSIVE METABOLIC PANEL
ALT: 50 U/L — ABNORMAL HIGH (ref 0–44)
AST: 46 U/L — ABNORMAL HIGH (ref 15–41)
Albumin: 4.1 g/dL (ref 3.5–5.0)
Alkaline Phosphatase: 154 U/L — ABNORMAL HIGH (ref 38–126)
Anion gap: 8 (ref 5–15)
BUN: 24 mg/dL — ABNORMAL HIGH (ref 8–23)
CO2: 27 mmol/L (ref 22–32)
Calcium: 8.9 mg/dL (ref 8.9–10.3)
Chloride: 104 mmol/L (ref 98–111)
Creatinine, Ser: 0.83 mg/dL (ref 0.44–1.00)
GFR calc Af Amer: >60 mL/min (ref >60)
GFR calc non Af Amer: >60 mL/min (ref >60)
Glucose, Bld: 99 mg/dL (ref 70–99)
Potassium: 3.5 mmol/L (ref 3.5–5.1)
Sodium: 139 mmol/L (ref 135–145)
Total Bilirubin: 0.4 mg/dL (ref 0.3–1.2)
Total Protein: 7.6 g/dL (ref 6.5–8.1)

## 2018-05-31 LAB — CBC
HCT: 37.2 % (ref 36.0–46.0)
Hemoglobin: 11.7 g/dL — ABNORMAL LOW (ref 12.0–15.0)
MCH: 24.2 pg — ABNORMAL LOW (ref 26.0–34.0)
MCHC: 31.5 g/dL (ref 30.0–36.0)
MCV: 77 fL — ABNORMAL LOW (ref 80.0–100.0)
Platelets: 218 10*3/uL (ref 150–400)
RBC: 4.83 MIL/uL (ref 3.87–5.11)
RDW: 16.2 % — ABNORMAL HIGH (ref 11.5–15.5)
WBC: 7.1 10*3/uL (ref 4.0–10.5)
nRBC: 0 % (ref 0.0–0.2)

## 2018-05-31 LAB — LIPASE, BLOOD: Lipase: 27 U/L (ref 11–51)

## 2018-05-31 LAB — URINALYSIS, COMPLETE (UACMP) WITH MICROSCOPIC
Bacteria, UA: NONE SEEN
Bilirubin Urine: NEGATIVE
Glucose, UA: NEGATIVE mg/dL
Hgb urine dipstick: NEGATIVE
Ketones, ur: NEGATIVE mg/dL
Nitrite: NEGATIVE
Protein, ur: NEGATIVE mg/dL
Specific Gravity, Urine: 1.021 (ref 1.005–1.030)
pH: 5 (ref 5.0–8.0)

## 2018-05-31 MED ORDER — IOPAMIDOL (ISOVUE-300) INJECTION 61%
30.0000 mL | Freq: Once | INTRAVENOUS | Status: AC | PRN
  Administered 2018-05-31: 30 mL via ORAL
  Filled 2018-05-31: qty 30

## 2018-05-31 MED ORDER — SUCRALFATE 1 G PO TABS: 1.0000 g | ORAL_TABLET | Freq: Four times a day (QID) | ORAL | 0 refills | Status: DC

## 2018-05-31 MED ORDER — PANTOPRAZOLE SODIUM 20 MG PO TBEC: 20.0000 mg | DELAYED_RELEASE_TABLET | Freq: Every day | ORAL | 1 refills | Status: DC

## 2018-05-31 MED ORDER — ALUM & MAG HYDROXIDE-SIMETH 200-200-20 MG/5ML PO SUSP
30.0000 mL | Freq: Once | ORAL | Status: AC
  Administered 2018-05-31: 30 mL via ORAL
  Filled 2018-05-31: qty 30

## 2018-05-31 MED ORDER — ONDANSETRON HCL 4 MG/2ML IJ SOLN
4.0000 mg | Freq: Once | INTRAMUSCULAR | Status: AC
  Administered 2018-05-31: 4 mg via INTRAVENOUS
  Filled 2018-05-31: qty 2

## 2018-05-31 MED ORDER — LIDOCAINE VISCOUS HCL 2 % MT SOLN
15.0000 mL | Freq: Once | OROMUCOSAL | Status: AC
  Administered 2018-05-31: 15 mL via ORAL
  Filled 2018-05-31: qty 15

## 2018-05-31 MED ORDER — MORPHINE SULFATE (PF) 4 MG/ML IV SOLN
4.0000 mg | Freq: Once | INTRAVENOUS | Status: AC
  Administered 2018-05-31: 4 mg via INTRAVENOUS
  Filled 2018-05-31: qty 1

## 2018-05-31 MED ORDER — IOHEXOL 300 MG/ML  SOLN
100.0000 mL | Freq: Once | INTRAMUSCULAR | Status: AC | PRN
  Administered 2018-05-31: 100 mL via INTRAVENOUS
  Filled 2018-05-31: qty 100

## 2018-05-31 NOTE — ED Notes (Signed)
Pt is on 2nd bottle of contrast at this time.

## 2018-05-31 NOTE — ED Notes (Signed)
Patient transported to CT 

## 2018-05-31 NOTE — ED Provider Notes (Signed)
[facility] Emergency Department Provider Note   ____________________________________________    I have reviewed the triage vital signs and the nursing notes.   HISTORY  Chief Complaint Abdominal Pain     HPI Helen Mccormick is a 78 y.o. female who presents with complaints of abdominal pain.  Patient describes pain just above her umbilicus which is been ongoing for approximately 2 days, she is never had before.  She denies fevers or chills.  No nausea or vomiting.  She describes the pain as a grabbing pain.  She has not take anything for this.  The pain does not radiate.  Past Medical History:  Diagnosis Date  . Allergy   . Anxiety   . Colon polyp   . Depression   . GERD (gastroesophageal reflux disease)   . Heart murmur   . Hyperlipidemia   . Hypertension   . Sleep apnea   . Thyroid disease     Patient Active Problem List   Diagnosis Date Noted  . Macular degeneration of both eyes 10/13/2017  . Angina at rest [facility]) 08/15/2017  . Fracture of distal end of radius 06/03/2017  . Hypertension 03/24/2017  . DM (diabetes mellitus), type 2 ([place]) 03/24/2017  . 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 Hyperlipidemia due to type 2 diabetes mellitus ([place]) 03/24/2017  . Cervical radiculitis 12/14/2013  . DDD (degenerative disc disease), cervical 12/14/2013  . Left shoulder pain 12/14/2013    Past Surgical History:  Procedure Laterality Date  . ABDOMINAL HYSTERECTOMY    . BACK SURGERY    . BACK SURGERY    . CERVICAL FUSION    . COLONOSCOPY WITH PROPOFOL N/A 05/28/2017   Procedure: COLONOSCOPY WITH PROPOFOL;  Surgeon: Jonathon Bellows, MD;  Location: [facility] ENDOSCOPY;  Service: Gastroenterology;  Laterality: N/A;  . NECK SURGERY    . THYROID SURGERY    . THYROID SURGERY      Prior to Admission medications   Medication Sig Start Date End Date Taking? Authorizing Provider  albuterol (PROVENTIL HFA;VENTOLIN HFA) 108 (90 Base) MCG/ACT inhaler  Inhale into the lungs every 6 (six) hours as needed for wheezing or shortness of breath.    [provider]  amLODipine (NORVASC) 5 MG tablet TAKE 1 TABLET(5 MG) BY MOUTH DAILY 11/25/17   [name], NP  Ascorbic Acid (VITAMIN C) 1000 MG tablet Take 1,000 mg by mouth daily.    [provider]  aspirin 81 MG chewable tablet Chew 81 mg by mouth daily.     [provider]  atorvastatin (LIPITOR) 40 MG tablet Take 1 tablet (40 mg total) by mouth at bedtime. 04/14/18   [name], NP  calcium carbonate (CALCIUM 600) 600 MG TABS tablet Take 600 mg by mouth 2 (two) times daily with a meal.    [provider]  cholecalciferol (VITAMIN D) 1000 units tablet Take 1 tablet by mouth daily.     [provider]  dicyclomine (BENTYL) 10 MG capsule Take 1 capsule three times daily with meals as needed for abdominal pain or cramping with diarrhea. Patient not taking: Reported on 05/03/2018 10/18/17   [name], NP  escitalopram (LEXAPRO) 10 MG tablet TAKE 1 TABLET BY MOUTH EVERY DAY FOR DEPRESSION 02/23/18   [name], NP  ferrous sulfate 325 (65 FE) MG tablet Take 325 mg by mouth daily with breakfast.    [provider]  Flaxseed, Linseed, (FLAX SEED OIL) 1000 MG CAPS Take by mouth.  [provider]  Fluocinolone Acetonide Body 0.01 % OIL Apply 1-2x daily for flares of itching of the scalp 11/15/17   [provider]  fluticasone (FLONASE) 50 MCG/ACT nasal spray Place 2 sprays into both nostrils daily.    [provider]  glucose blood test strip Use 1 strip to check your blood sugar two times daily. 03/12/17   [name], NP  hydrochlorothiazide (HYDRODIURIL) 12.5 MG tablet  02/09/18   [provider]  isosorbide mononitrate (IMDUR) 30 MG 24 hr tablet Take 1 tablet (30 mg total) by mouth daily. 08/16/17 09/15/17  Saundra Shelling, MD  levothyroxine (SYNTHROID, LEVOTHROID) 75 MCG  tablet TAKE 1 TABLET BY MOUTH DAILY BEFORE BREAKFAST 04/05/18   [name], NP  meloxicam (MOBIC) 15 MG tablet meloxicam 15 mg tablet  Take 1 tablet every day by oral route.    [provider]  metFORMIN (GLUCOPHAGE-XR) 500 MG 24 hr tablet Take 1 tablet (500 mg total) by mouth 2 (two) times daily with a meal. 11/30/17   [name], NP  methocarbamol (ROBAXIN) 500 MG tablet methocarbamol 500 mg tablet  TAKE 1 TABLET BY MOUTH TWICE DAILY AS NEEDED    [provider]  Multiple Vitamins-Minerals (PRESERVISION AREDS 2 PO) Take by mouth.    [provider]  nitroGLYCERIN (NITROSTAT) 0.4 MG SL tablet Place 1 tablet (0.4 mg total) under the tongue every 5 (five) minutes as needed for chest pain. Patient not taking: Reported on 03/17/2018 08/20/17   [name], NP  Omega-3 1000 MG CAPS Take 1 capsule by mouth daily.     [provider]  omeprazole (PRILOSEC) 40 MG capsule TAKE 1 CAPSULE(40 MG) BY MOUTH DAILY 03/29/18   [name], NP  pantoprazole (PROTONIX) 20 MG tablet Take 1 tablet (20 mg total) by mouth daily. 05/31/18 05/31/19  Lavonia Drafts, MD  predniSONE (DELTASONE) 10 MG tablet Day 1-2 take 6 pills. Day 3 take 5 pills then reduce by 1 pill each day. Patient not taking: Reported on 03/17/2018 01/13/18   [name], NP  pyridOXINE (VITAMIN B-6) 100 MG tablet Take 100 mg by mouth daily.    [provider]  sucralfate (CARAFATE) 1 g tablet Take 1 tablet (1 g total) by mouth 4 (four) times daily for 10 days. 05/31/18 06/10/18  Lavonia Drafts, MD  tiZANidine (ZANAFLEX) 2 MG tablet  12/31/17   [provider]     Allergies Amlodipine; Etodolac; and Tramadol  Family History  Problem Relation Age of Onset  . Breast cancer Maternal Aunt 60  . Diabetes Mother   . Leukemia Mother   . Diabetes Sister   . Mental illness Sister   . Colon polyps Sister   . Stroke Father   . Cancer Brother        rectal    . Healthy Daughter   . Hypertension Son   . Diabetes Sister   . Thyroid disease Sister   . Healthy Sister   . Healthy Sister   . Stroke Brother   . Healthy Son   . Healthy Son   . Healthy Daughter     Social History Social History   Tobacco Use  . Smoking status: Never Smoker  . Smokeless tobacco: Never Used  Substance Use Topics  . Alcohol use: No  . Drug use: No    Review of Systems  Constitutional: No fever/chills Eyes: No visual changes.  ENT: No sore throat. Cardiovascular: Denies chest pain.  Respiratory: Denies shortness of breath. Gastrointestinal: As above Genitourinary: Negative for dysuria. Musculoskeletal: Negative for back pain. Skin: Negative for rash. Neurological: Negative for headaches or weakness   ____________________________________________   PHYSICAL EXAM:  VITAL SIGNS: ED Triage Vitals  Enc Vitals Group     BP 05/31/18 1541 131/65     Pulse Rate 05/31/18 1541 77     Resp 05/31/18 1933 14     Temp 05/31/18 1541 98.6 F (37 C)     Temp Source 05/31/18 1541 Oral     SpO2 05/31/18 1541 96 %     Weight 05/31/18 1541 70.3 kg (155 lb)     Height 05/31/18 1541 1.626 m (5\' 4" )     Head Circumference --      Peak Flow --      Pain Score 05/31/18 1544 9     Pain Loc --      Pain Edu? --      Excl. in [place]? --     Constitutional: Alert and oriented. No acute distress. Pleasant and interactive Eyes: Conjunctivae are normal.    Cardiovascular: Normal rate, regular rhythm. Grossly normal heart sounds.  Good peripheral circulation. Respiratory: Normal respiratory effort.  No retractions. Lungs CTAB. Gastrointestinal: Mild tenderness palpation just superior to the umbilicus, no right upper quadrant pain, no distention Genitourinary: deferred Musculoskeletal: No lower extremity tenderness nor edema.  Warm and well perfused Neurologic:  Normal speech and language. No gross focal neurologic deficits are appreciated.  Skin:  Skin is warm, dry  and intact. No rash noted. Psychiatric: Mood and affect are normal. Speech and behavior are normal.  ____________________________________________   LABS (all labs ordered are listed, but only abnormal results are displayed)  Labs Reviewed  COMPREHENSIVE METABOLIC PANEL - Abnormal; Notable for the following components:      Result Value   BUN 24 (*)    AST 46 (*)    ALT 50 (*)    Alkaline Phosphatase 154 (*)    All other components within normal limits  CBC - Abnormal; Notable for the following components:   Hemoglobin 11.7 (*)    MCV 77.0 (*)    MCH 24.2 (*)    RDW 16.2 (*)    All other components within normal limits  URINALYSIS, COMPLETE (UACMP) WITH MICROSCOPIC - Abnormal; Notable for the following components:   Color, Urine YELLOW (*)    APPearance CLEAR (*)    Leukocytes,Ua SMALL (*)    All other components within normal limits  LIPASE, BLOOD   ____________________________________________  EKG  None ____________________________________________  RADIOLOGY  The abdomen pelvis negative for acute process ____________________________________________   PROCEDURES  Procedure(s) performed: No  Procedures   Critical Care performed: No ____________________________________________   INITIAL IMPRESSION / ASSESSMENT AND PLAN / ED COURSE  Pertinent labs & imaging results that were available during my care of the patient were reviewed by me and considered in my medical decision making (see chart for details).  Patient overall well-appearing and in no acute distress, complains of pain just above the umbilicus almost to the level of the epigastrium suspicious for gastritis versus colitis.  Lab work is overall reassuring, urinalysis unremarkable.  Imaging shows no acute process, treated with GI cocktail with significant provement results, will start PPI Carafate outpatient follow-up with PCP for suspected gastritis, return precautions discussed     ____________________________________________   FINAL CLINICAL IMPRESSION(S) / ED DIAGNOSES  Final diagnoses:  Acute gastritis without hemorrhage, unspecified gastritis type  Note:  This document was prepared using Dragon voice recognition software and may include unintentional dictation errors.   Lavonia Drafts, MD 05/31/18 2056

## 2018-05-31 NOTE — ED Triage Notes (Signed)
Pt presents to ED via POV c/o generalized abd pain starting last night. Denies urinary s/sx.

## 2018-06-21 ENCOUNTER — Encounter: Payer: Self-pay | Admitting: Nurse Practitioner

## 2018-06-21 ENCOUNTER — Telehealth (INDEPENDENT_AMBULATORY_CARE_PROVIDER_SITE_OTHER): Payer: Medicare Other | Admitting: Nurse Practitioner

## 2018-06-21 ENCOUNTER — Other Ambulatory Visit: Payer: Self-pay

## 2018-06-21 DIAGNOSIS — K2900 Acute gastritis without bleeding: Secondary | ICD-10-CM | POA: Diagnosis not present

## 2018-06-21 DIAGNOSIS — I10 Essential (primary) hypertension: Secondary | ICD-10-CM | POA: Diagnosis not present

## 2018-06-21 DIAGNOSIS — E890 Postprocedural hypothyroidism: Secondary | ICD-10-CM | POA: Diagnosis not present

## 2018-06-21 DIAGNOSIS — K2970 Gastritis, unspecified, without bleeding: Secondary | ICD-10-CM | POA: Insufficient documentation

## 2018-06-21 DIAGNOSIS — E1169 Type 2 diabetes mellitus with other specified complication: Secondary | ICD-10-CM

## 2018-06-21 DIAGNOSIS — Z9114 Patient's other noncompliance with medication regimen: Secondary | ICD-10-CM

## 2018-06-21 DIAGNOSIS — F419 Anxiety disorder, unspecified: Secondary | ICD-10-CM

## 2018-06-21 DIAGNOSIS — D509 Iron deficiency anemia, unspecified: Secondary | ICD-10-CM

## 2018-06-21 DIAGNOSIS — I129 Hypertensive chronic kidney disease with stage 1 through stage 4 chronic kidney disease, or unspecified chronic kidney disease: Secondary | ICD-10-CM

## 2018-06-21 DIAGNOSIS — I208 Other forms of angina pectoris: Secondary | ICD-10-CM

## 2018-06-21 DIAGNOSIS — T50901A Poisoning by unspecified drugs, medicaments and biological substances, accidental (unintentional), initial encounter: Secondary | ICD-10-CM

## 2018-06-21 DIAGNOSIS — E785 Hyperlipidemia, unspecified: Secondary | ICD-10-CM

## 2018-06-21 DIAGNOSIS — I2089 Other forms of angina pectoris: Secondary | ICD-10-CM

## 2018-06-21 MED ORDER — AMLODIPINE BESYLATE 5 MG PO TABS: ORAL_TABLET | ORAL | 1 refills | Status: DC

## 2018-06-21 MED ORDER — ESCITALOPRAM OXALATE 10 MG PO TABS: ORAL_TABLET | ORAL | 1 refills | Status: DC

## 2018-06-21 MED ORDER — HYDROCHLOROTHIAZIDE 12.5 MG PO TABS: 12.5000 mg | ORAL_TABLET | Freq: Every day | ORAL | 1 refills | Status: DC

## 2018-06-21 MED ORDER — GLUCOSE BLOOD VI STRP: ORAL_STRIP | 12 refills | Status: DC

## 2018-06-21 MED ORDER — ATORVASTATIN CALCIUM 40 MG PO TABS: 40.0000 mg | ORAL_TABLET | Freq: Every day | ORAL | 1 refills | Status: DC

## 2018-06-21 MED ORDER — ISOSORBIDE MONONITRATE ER 30 MG PO TB24: 30.0000 mg | ORAL_TABLET | Freq: Every day | ORAL | 0 refills | Status: DC

## 2018-06-21 MED ORDER — SUCRALFATE 1 G PO TABS: 1.0000 g | ORAL_TABLET | Freq: Four times a day (QID) | ORAL | 0 refills | Status: DC

## 2018-06-21 MED ORDER — LEVOTHYROXINE SODIUM 75 MCG PO TABS: ORAL_TABLET | ORAL | 1 refills | Status: DC

## 2018-06-21 MED ORDER — METFORMIN HCL ER 500 MG PO TB24: 500.0000 mg | ORAL_TABLET | Freq: Two times a day (BID) | ORAL | 1 refills | Status: DC

## 2018-06-21 MED ORDER — PANTOPRAZOLE SODIUM 20 MG PO TBEC: 20.0000 mg | DELAYED_RELEASE_TABLET | Freq: Every day | ORAL | 1 refills | Status: DC

## 2018-06-21 NOTE — Progress Notes (Signed)
Cc: f/u diabees

## 2018-06-21 NOTE — Patient Instructions (Signed)
Helen Mccormick,   Thank you for coming in to clinic today.  1. Check your medication list with your current pill bottles. If there is no bottle in your supply for a medicine on your list, call your pharmacy.  If you have extra medication, call our clinic to see if you need to take it.  2. Make sure you get your atorvastatin, metformin, and isosorbide from your pharmacy and resume these medicines.  Please schedule a follow-up appointment with Cassell Smiles, AGNP in about 3 months for an in person recheck of blood pressure, diabetes.  If you have any other questions or concerns, please feel free to call the clinic or send a message through [place]. You may also schedule an earlier appointment if necessary.  You will receive a survey after today's visit either digitally by e-mail or paper by [employer]. Your experiences and feedback matter to [name].  Please respond so we know how we are doing as we provide care for you.   Cassell Smiles, DNP, AGNP-BC Adult Gerontology Nurse Practitioner [place]

## 2018-06-21 NOTE — Progress Notes (Signed)
Subjective:    Patient ID: Helen Mccormick, female    DOB: [birth year], 78 y.o.   MRN: [id number]  Helen Mccormick is a 78 y.o. female presenting on 06/21/2018 for Diabetes, Hypertension, Gastritis ER Follow-up visit  HPI Diabetes Pt presents today for follow up of Type 2 diabetes mellitus.  She is checking fasting am CBG at home with a range of Today 112, 120, Usually under 120 for morning CBGs  Occasionally is 140s. - Current diabetic medications include: metformin - She is not currently symptomatic.  - She denies polydipsia, polyphagia, polyuria, headaches, diaphoresis, shakiness, chills, pain, numbness or tingling in extremities and changes in vision.  Had some sensation of being cold, hot last week.  Blood sugar was a little lower.  - Clinical course has been stable. - She reports an exercise routine that includes walking, 4 days per week.   - Her diet is low in salt, low in fat, and low in carbohydrates. - Weight trend: stable  PREVENTION: Eye exam current (within one year): yes Foot exam current (within one year): no - no reported problems with feet.  Patient does regular foot check and moisturizer Lipid/ASCVD risk reduction - on statin: prescribed - not taking Kidney protection - on ace or arb: none - last microalbumin WNL.  Recent Labs    11/30/17 1705 03/17/18 1117  HGBA1C 6.8* 6.6*    Hypertension - She is checking BP at home or outside of clinic.  Readings 3/17 148/74;  3/23 141/69; 3/24 133/68 - She continues to be managed by Cardiologist Dr. Humphrey Rolls - Current medications: amlodipine 5 mg once daily, HCTZ 12.5 mg once daily, isosorbide mononitrate 30 mg daily(not currently taking due to being out), tolerating well without side effects - She is not currently symptomatic.  Despite being off Imdur, patient is not having chest pain. - Pt denies headache, lightheadedness, dizziness, changes in vision, chest tightness/pressure, palpitations, leg swelling, sudden loss of  speech or loss of consciousness.  Gastritis Patient has had gastritis with possible hemorrhage treated in ER on 05/31/2018.  She has had improved symptoms, but continues to have stomach pain after eating.  She continues to take pantoprazole 20 mg daily.  She has run out of sucralfate, but has not had resolution of stomach pain symptoms.  Requests refill today.  Otherwise, patient is noting no GI bleeding to include melena, BRBPR or coffee-ground emesis.  Medication administration problems: Patient is not currently taking her atorvastatin - has gotten no fill from her drug store. [facility]) - Patient is not checking bag when she leaves.  Patient often needs to go twice per month.  Usually gets meds filled sometime between the 1st-3rd of the month.  She doesn't usually have all of them filled and doesn't always go back to get the later fills.  She is currently out of atorvastatin, isosorbide, and metformin.  She also has a hard time pronouncing names of meds and does not know what every medicine is for.    Hypothyroidism - Pt states she is taking her levothyroxine 31mcg in the am at least 1 hour before eating or drinking and taking other medicines.   - She is not currently symptomatic. - She denies fatigue, excess energy, weight changes, heart racing, heart palpitations, heat and cold intolerance, changes in hair/skin/nails, and lower leg swelling.  - She does not have any compressive symptoms to include difficulty swallowing, globus sensation, or difficulty breathing when lying flat.   Social History   Tobacco  Use   Smoking status: Never Smoker   Smokeless tobacco: Never Used  Substance Use Topics   Alcohol use: No   Drug use: No    Review of Systems Per HPI unless specifically indicated above     Objective:    There were no vitals taken for this visit.  Wt Readings from Last 3 Encounters:  05/31/18 155 lb (70.3 kg)  05/03/18 169 lb 6.4 oz (76.8 kg)  03/17/18 169 lb (76.7  kg)    Physical Exam  Patient evaluated virtually.  She is alert, oriented and active communicator for her care today.  She has appropriate judgment and has normal mood/affect with appropriate verbal intonation changes.    Results for orders placed or performed during the hospital encounter of 05/31/18  Lipase, blood  Result Value Ref Range   Lipase 27 11 - 51 U/L  Comprehensive metabolic panel  Result Value Ref Range   Sodium 139 135 - 145 mmol/L   Potassium 3.5 3.5 - 5.1 mmol/L   Chloride 104 98 - 111 mmol/L   CO2 27 22 - 32 mmol/L   Glucose, Bld 99 70 - 99 mg/dL   BUN 24 (H) 8 - 23 mg/dL   Creatinine, Ser 0.83 0.44 - 1.00 mg/dL   Calcium 8.9 8.9 - 10.3 mg/dL   Total Protein 7.6 6.5 - 8.1 g/dL   Albumin 4.1 3.5 - 5.0 g/dL   AST 46 (H) 15 - 41 U/L   ALT 50 (H) 0 - 44 U/L   Alkaline Phosphatase 154 (H) 38 - 126 U/L   Total Bilirubin 0.4 0.3 - 1.2 mg/dL   GFR calc non Af Amer >60 >60 mL/min   GFR calc Af Amer >60 >60 mL/min   Anion gap 8 5 - 15  CBC  Result Value Ref Range   WBC 7.1 4.0 - 10.5 K/uL   RBC 4.83 3.87 - 5.11 MIL/uL   Hemoglobin 11.7 (L) 12.0 - 15.0 g/dL   HCT 37.2 36.0 - 46.0 %   MCV 77.0 (L) 80.0 - 100.0 fL   MCH 24.2 (L) 26.0 - 34.0 pg   MCHC 31.5 30.0 - 36.0 g/dL   RDW 16.2 (H) 11.5 - 15.5 %   Platelets 218 150 - 400 K/uL   nRBC 0.0 0.0 - 0.2 %  Urinalysis, Complete w Microscopic  Result Value Ref Range   Color, Urine YELLOW (A) YELLOW   APPearance CLEAR (A) CLEAR   Specific Gravity, Urine 1.021 1.005 - 1.030   pH 5.0 5.0 - 8.0   Glucose, UA NEGATIVE NEGATIVE mg/dL   Hgb urine dipstick NEGATIVE NEGATIVE   Bilirubin Urine NEGATIVE NEGATIVE   Ketones, ur NEGATIVE NEGATIVE mg/dL   Protein, ur NEGATIVE NEGATIVE mg/dL   Nitrite NEGATIVE NEGATIVE   Leukocytes,Ua SMALL (A) NEGATIVE   RBC / HPF 0-5 0 - 5 RBC/hpf   WBC, UA 0-5 0 - 5 WBC/hpf   Bacteria, UA NONE SEEN NONE SEEN   Squamous Epithelial / LPF 0-5 0 - 5   Mucus PRESENT       Assessment &  Plan:   Problem List Items Addressed This Visit      Cardiovascular and Mediastinum   Hypertension - Primary Uncontrolled hypertension.  BP goal < 130/80.  Pt is working on lifestyle modifications.  Taking medications tolerating well without side effects, but has not been on IMDUR recently.  No known complications.  Plan: 1. Continue taking all meds without changes.  Resume Imdur. 2. Obtain  labs tomorrow  3. Encouraged heart healthy diet and increasing exercise to 30 minutes most days of the week. 4. Check BP 1-2 x per week at home, keep log, and bring to clinic at next appointment. 5. Follow up 3 months.     Relevant Medications   amLODipine (NORVASC) 5 MG tablet   hydrochlorothiazide (HYDRODIURIL) 12.5 MG tablet   atorvastatin (LIPITOR) 40 MG tablet   isosorbide mononitrate (IMDUR) 30 MG 24 hr tablet   Other Relevant Orders   COMPLETE METABOLIC PANEL WITH GFR   Angina at rest (HCC) Stable even off Imdur.  Resume Imdur - Rx sent.  Continue Follow-up with Dr. Humphrey Rolls.   Relevant Medications   amLODipine (NORVASC) 5 MG tablet   hydrochlorothiazide (HYDRODIURIL) 12.5 MG tablet   atorvastatin (LIPITOR) 40 MG tablet   isosorbide mononitrate (IMDUR) 30 MG 24 hr tablet     Digestive   Gastritis Improving, continues having mild symptoms.   - Continue pantoprazole and sucralfate - Recheck labs for possible GI bleeding. - Follow-up 3 months.   Relevant Medications   pantoprazole (PROTONIX) 20 MG tablet   sucralfate (CARAFATE) 1 g tablet   Other Relevant Orders   CBC with Differential/Platelet   COMPLETE METABOLIC PANEL WITH GFR     Endocrine   DM (diabetes mellitus), type 2 ([place]) Stable on patient report.  Patient off metformin, atorvastatin.  No current hypo or hyperglycemia.    Plan:  1. Continue current therapy: refill metformin 2. Encourage improved lifestyle: - low carb/low glycemic diet encouraged - Increase physical activity to 30 minutes most days of the week.  Explained  that increased physical activity increases body's use of sugar for energy. 3. Check fasting am CBG and bring log to next visit for review 4. Continue ASA and Statin 5. Advised to schedule DM ophtho exam, send record.  6. Labs due - fasting this week 7. Follow-up 3 mos    Relevant Medications   atorvastatin (LIPITOR) 40 MG tablet   metFORMIN (GLUCOPHAGE-XR) 500 MG 24 hr tablet   glucose blood test strip   Other Relevant Orders   COMPLETE METABOLIC PANEL WITH GFR   Hemoglobin A1c   Hypothyroidism, postsurgical Stable symptoms today on exam.  Medications tolerated without side effects.  Continue at current doses.  Refills provided.  Check labs this week. Followup 3 months.    Relevant Medications   levothyroxine (SYNTHROID, LEVOTHROID) 75 MCG tablet   Other Relevant Orders   TSH   Hyperlipidemia due to type 2 diabetes mellitus ([place]) Current status unknown.  Off atorvastatin - refills sent and encouraged patient to call pharmacy for refills.  Follow-up after labs prn.   Relevant Medications   amLODipine (NORVASC) 5 MG tablet   hydrochlorothiazide (HYDRODIURIL) 12.5 MG tablet   atorvastatin (LIPITOR) 40 MG tablet   isosorbide mononitrate (IMDUR) 30 MG 24 hr tablet   metFORMIN (GLUCOPHAGE-XR) 500 MG 24 hr tablet   Other Relevant Orders   COMPLETE METABOLIC PANEL WITH GFR   Lipid panel   TSH    Other Visit Diagnoses    Medication administered in error, accidental or unintentional, initial encounter     Patient without accurate med administration with neglect to take atorvastatin, Imdur, and metformin.  Patient has difficulty knowing what medications she takes and why.  She also has difficulty keeping track of meds that need to be refilled and relies solely on pharmacy to provide what she needs automatically.  - Brief education provided to match med list to pill  bottles every time.  Call pharmacy if missing med or call clinic if she has extra medication not on med list.    Anxiety       Stable.  Requests refills.  Sent.  Follow-up 3 months.  Stable gad7 and phq9 today.   Relevant Medications   escitalopram (LEXAPRO) 10 MG tablet      Meds ordered this encounter  Medications   amLODipine (NORVASC) 5 MG tablet    Sig: TAKE 1 TABLET(5 MG) BY MOUTH DAILY FOR BLOOD PRESSURE    Dispense:  90 tablet    Refill:  1    Order Specific Question:   Supervising Provider    Answer:   Olin Hauser [[id number]]   escitalopram (LEXAPRO) 10 MG tablet    Sig: TAKE 1 TABLET BY MOUTH EVERY DAY FOR DEPRESSION    Dispense:  90 tablet    Refill:  1    Order Specific Question:   Supervising Provider    Answer:   Olin Hauser [[id number]]   hydrochlorothiazide (HYDRODIURIL) 12.5 MG tablet    Sig: Take 1 tablet (12.5 mg total) by mouth daily. FOR BLOOD PRESSURE    Dispense:  90 tablet    Refill:  1    Order Specific Question:   Supervising Provider    Answer:   Olin Hauser [[id number]]   levothyroxine (SYNTHROID, LEVOTHROID) 75 MCG tablet    Sig: TAKE 1 TABLET BY MOUTH DAILY BEFORE BREAKFAST FOR THYROID    Dispense:  90 tablet    Refill:  1    Order Specific Question:   Supervising Provider    Answer:   Olin Hauser [[id number]]   pantoprazole (PROTONIX) 20 MG tablet    Sig: Take 1 tablet (20 mg total) by mouth daily. FOR ACID REFLUX AND STOMACH PAIN    Dispense:  90 tablet    Refill:  1    Order Specific Question:   Supervising Provider    Answer:   Olin Hauser [[id number]]   atorvastatin (LIPITOR) 40 MG tablet    Sig: Take 1 tablet (40 mg total) by mouth at bedtime. For Cholesterol    Dispense:  90 tablet    Refill:  1    NOTE dose change - Cancel 20 mg prescription.  Patient to continue taking 40 mg once daily.    Order Specific Question:   Supervising Provider    Answer:   Olin Hauser [[id number]]   isosorbide mononitrate (IMDUR) 30 MG 24 hr tablet    Sig: Take 1 tablet (30 mg total) by mouth daily for 30 days.    Dispense:  30  tablet    Refill:  0    Order Specific Question:   Supervising Provider    Answer:   Olin Hauser [[id number]]   metFORMIN (GLUCOPHAGE-XR) 500 MG 24 hr tablet    Sig: Take 1 tablet (500 mg total) by mouth 2 (two) times daily with a meal.    Dispense:  180 tablet    Refill:  1    Order Specific Question:   Supervising Provider    Answer:   Olin Hauser [[id number]]   sucralfate (CARAFATE) 1 g tablet    Sig: Take 1 tablet (1 g total) by mouth 4 (four) times daily for 20 days.    Dispense:  80 tablet    Refill:  0    Order Specific Question:   Supervising Provider    Answer:   Nobie Putnam  J [[id number]]   glucose blood test strip    Sig: Use 1 strip to check your blood sugar two times daily.    Dispense:  50 each    Refill:  12    Order Specific Question:   Supervising Provider    Answer:   Olin Hauser [[id number]]   Follow up plan: Follow-up for fasting labs this week and in 3 months for hypertension, diabetes - needs foot exam.  Disclosed to patient at start of encounter that we will bill telephone services and she will receive bill of services provided.  Patient consents to be treated via phone prior to discussion. - Patient is at her home and is accessed via telephone. - Services are provided from [facility]. - Time spent in direct consultation with patient on phone: 25 minutes  Cassell Smiles, DNP, AGPCNP-BC Adult Gerontology Primary Care Nurse Practitioner [facility] 06/21/2018, 1:47 PM

## 2018-06-24 ENCOUNTER — Other Ambulatory Visit: Payer: Self-pay | Admitting: Nurse Practitioner

## 2018-06-24 DIAGNOSIS — K219 Gastro-esophageal reflux disease without esophagitis: Secondary | ICD-10-CM

## 2018-06-25 LAB — COMPLETE METABOLIC PANEL WITH GFR
AG Ratio: 1.3 (calc) (ref 1.0–2.5)
ALT: 52 U/L — ABNORMAL HIGH (ref 6–29)
AST: 45 U/L — ABNORMAL HIGH (ref 10–35)
Albumin: 3.9 g/dL (ref 3.6–5.1)
Alkaline phosphatase (APISO): 156 U/L — ABNORMAL HIGH (ref 37–153)
BUN: 23 mg/dL (ref 7–25)
CO2: 30 mmol/L (ref 20–32)
Calcium: 9.3 mg/dL (ref 8.6–10.4)
Chloride: 103 mmol/L (ref 98–110)
Creat: 0.93 mg/dL (ref 0.60–0.93)
GFR, Est African American: 70 mL/min/{1.73_m2} (ref >=60)
GFR, Est Non African American: 60 mL/min/{1.73_m2} (ref >=60)
Globulin: 2.9 g/dL (calc) (ref 1.9–3.7)
Glucose, Bld: 125 mg/dL — ABNORMAL HIGH (ref 65–99)
Potassium: 3.9 mmol/L (ref 3.5–5.3)
Sodium: 142 mmol/L (ref 135–146)
Total Bilirubin: 0.4 mg/dL (ref 0.2–1.2)
Total Protein: 6.8 g/dL (ref 6.1–8.1)

## 2018-06-25 LAB — CBC WITH DIFFERENTIAL/PLATELET
Absolute Monocytes: 382 cells/uL (ref 200–950)
Basophils Absolute: 21 cells/uL (ref 0–200)
Basophils Relative: 0.4 %
Eosinophils Absolute: 201 cells/uL (ref 15–500)
Eosinophils Relative: 3.8 %
HCT: 36 % (ref 35.0–45.0)
Hemoglobin: 11.6 g/dL — ABNORMAL LOW (ref 11.7–15.5)
Lymphs Abs: 2417 cells/uL (ref 850–3900)
MCH: 24.7 pg — ABNORMAL LOW (ref 27.0–33.0)
MCHC: 32.2 g/dL (ref 32.0–36.0)
MCV: 76.8 fL — ABNORMAL LOW (ref 80.0–100.0)
MPV: 12.7 fL — ABNORMAL HIGH (ref 7.5–12.5)
Monocytes Relative: 7.2 %
Neutro Abs: 2279 cells/uL (ref 1500–7800)
Neutrophils Relative %: 43 %
Platelets: 218 10*3/uL (ref 140–400)
RBC: 4.69 10*6/uL (ref 3.80–5.10)
RDW: 15.8 % — ABNORMAL HIGH (ref 11.0–15.0)
Total Lymphocyte: 45.6 %
WBC: 5.3 10*3/uL (ref 3.8–10.8)

## 2018-06-25 LAB — HEMOGLOBIN A1C
Hgb A1c MFr Bld: 6.6 % of total Hgb — ABNORMAL HIGH (ref <5.7)
Mean Plasma Glucose: 143 (calc)
eAG (mmol/L): 7.9 (calc)

## 2018-06-25 LAB — LIPID PANEL
Cholesterol: 234 mg/dL — ABNORMAL HIGH (ref <200)
HDL: 39 mg/dL — ABNORMAL LOW (ref >=50)
LDL Cholesterol (Calc): 157 mg/dL (calc) — ABNORMAL HIGH
Non-HDL Cholesterol (Calc): 195 mg/dL (calc) — ABNORMAL HIGH (ref <130)
Total CHOL/HDL Ratio: 6 (calc) — ABNORMAL HIGH (ref <5.0)
Triglycerides: 222 mg/dL — ABNORMAL HIGH (ref <150)

## 2018-06-25 LAB — TSH: TSH: 1.49 mIU/L (ref 0.40–4.50)

## 2018-06-29 ENCOUNTER — Encounter: Payer: Self-pay | Admitting: Family Medicine

## 2018-06-29 DIAGNOSIS — D509 Iron deficiency anemia, unspecified: Secondary | ICD-10-CM | POA: Insufficient documentation

## 2018-06-30 NOTE — Addendum Note (Signed)
Addended by: Cassell Smiles R on: 06/30/2018 09:00 AM   Modules accepted: Orders

## 2018-07-01 ENCOUNTER — Other Ambulatory Visit: Payer: Self-pay

## 2018-07-01 DIAGNOSIS — D509 Iron deficiency anemia, unspecified: Secondary | ICD-10-CM

## 2018-07-04 ENCOUNTER — Other Ambulatory Visit: Payer: Self-pay

## 2018-07-06 ENCOUNTER — Encounter: Payer: Self-pay | Admitting: Nurse Practitioner

## 2018-07-06 ENCOUNTER — Ambulatory Visit (INDEPENDENT_AMBULATORY_CARE_PROVIDER_SITE_OTHER): Payer: Medicare Other | Admitting: Nurse Practitioner

## 2018-07-06 ENCOUNTER — Other Ambulatory Visit: Payer: Self-pay

## 2018-07-06 DIAGNOSIS — I208 Other forms of angina pectoris: Secondary | ICD-10-CM | POA: Diagnosis not present

## 2018-07-06 DIAGNOSIS — G4441 Drug-induced headache, not elsewhere classified, intractable: Secondary | ICD-10-CM | POA: Diagnosis not present

## 2018-07-06 DIAGNOSIS — R42 Dizziness and giddiness: Secondary | ICD-10-CM | POA: Diagnosis not present

## 2018-07-06 DIAGNOSIS — I2089 Other forms of angina pectoris: Secondary | ICD-10-CM

## 2018-07-06 NOTE — Progress Notes (Signed)
Telemedicine Encounter: Disclosed to patient at start of encounter that we will provide appropriate telemedicine services.  Patient consents to be treated via phone prior to discussion. - Patient is at her home and is accessed via telephone. - Services are provided by Cassell Smiles from [facility].  Subjective:    Patient ID: Helen Mccormick, female    DOB: 1941-01-03, 78 y.o.   MRN: [id number]  Helen Mccormick is a 78 y.o. female presenting on 07/06/2018 for Headache (the pt associate the headaches, dizziness w/ HCTZ or isosorbide  x 4 days)  HPI  Headache and dizziness BP readings over last 2 days have been 133/66 with Pulse 77; 140/62 with Pulse 71. She has not taken her BP today. - Dizziness occurs when standing and walking.  Occurs when moving from chair to standing.  Does not occur when lying down or when sitting. - Patient denies sensation that room spins. - Patient has restarted Imdur which she had stopped at our last telephone visit.  This caused her to have new/worsening chest pain, so it was recommended she restart this and continue follow-up with her cardiologist.  Social History   Tobacco Use  . Smoking status: Never Smoker  . Smokeless tobacco: Never Used  Substance Use Topics  . Alcohol use: No  . Drug use: No    Review of Systems Per HPI unless specifically indicated above    Objective:    There were no vitals taken for this visit.  Wt Readings from Last 3 Encounters:  05/31/18 155 lb (70.3 kg)  05/03/18 169 lb 6.4 oz (76.8 kg)  03/17/18 169 lb (76.7 kg)    Physical Exam Patient remotely monitored.  Verbal communication appropriate.  Cognition normal.  Results for orders placed or performed in visit on 06/21/18  CBC with Differential/Platelet  Result Value Ref Range   WBC 5.3 3.8 - 10.8 Thousand/uL   RBC 4.69 3.80 - 5.10 Million/uL   Hemoglobin 11.6 (L) 11.7 - 15.5 g/dL   HCT 36.0 35.0 - 45.0 %   MCV 76.8 (L) 80.0 - 100.0 fL   MCH  24.7 (L) 27.0 - 33.0 pg   MCHC 32.2 32.0 - 36.0 g/dL   RDW 15.8 (H) 11.0 - 15.0 %   Platelets 218 140 - 400 Thousand/uL   MPV 12.7 (H) 7.5 - 12.5 fL   Neutro Abs 2,279 1,500 - 7,800 cells/uL   Lymphs Abs 2,417 850 - 3,900 cells/uL   Absolute Monocytes 382 200 - 950 cells/uL   Eosinophils Absolute 201 15 - 500 cells/uL   Basophils Absolute 21 0 - 200 cells/uL   Neutrophils Relative % 43 %   Total Lymphocyte 45.6 %   Monocytes Relative 7.2 %   Eosinophils Relative 3.8 %   Basophils Relative 0.4 %  COMPLETE METABOLIC PANEL WITH GFR  Result Value Ref Range   Glucose, Bld 125 (H) 65 - 99 mg/dL   BUN 23 7 - 25 mg/dL   Creat 0.93 0.60 - 0.93 mg/dL   GFR, Est Non African American 60 > OR = 60 mL/min/1.37m2   GFR, Est African American 70 > OR = 60 mL/min/1.55m2   BUN/Creatinine Ratio NOT APPLICABLE 6 - 22 (calc)   Sodium 142 135 - 146 mmol/L   Potassium 3.9 3.5 - 5.3 mmol/L   Chloride 103 98 - 110 mmol/L   CO2 30 20 - 32 mmol/L   Calcium 9.3 8.6 - 10.4 mg/dL   Total Protein 6.8 6.1 -  8.1 g/dL   Albumin 3.9 3.6 - 5.1 g/dL   Globulin 2.9 1.9 - 3.7 g/dL (calc)   AG Ratio 1.3 1.0 - 2.5 (calc)   Total Bilirubin 0.4 0.2 - 1.2 mg/dL   Alkaline phosphatase (APISO) 156 (H) 37 - 153 U/L   AST 45 (H) 10 - 35 U/L   ALT 52 (H) 6 - 29 U/L  Hemoglobin A1c  Result Value Ref Range   Hgb A1c MFr Bld 6.6 (H) <5.7 % of total Hgb   Mean Plasma Glucose 143 (calc)   eAG (mmol/L) 7.9 (calc)  Lipid panel  Result Value Ref Range   Cholesterol 234 (H) <200 mg/dL   HDL 39 (L) > OR = 50 mg/dL   Triglycerides 222 (H) <150 mg/dL   LDL Cholesterol (Calc) 157 (H) mg/dL (calc)   Total CHOL/HDL Ratio 6.0 (H) <5.0 (calc)   Non-HDL Cholesterol (Calc) 195 (H) <130 mg/dL (calc)  TSH  Result Value Ref Range   TSH 1.49 0.40 - 4.50 mIU/L      Assessment & Plan:   Problem List Items Addressed This Visit    None    Visit Diagnoses    Intractable drug-induced headache, not elsewhere classified    -  Primary    Relevant Medications   tiZANidine (ZANAFLEX) 2 MG tablet   Stable angina (HCC)       Postural dizziness        Patient with new postural dizziness since restarting Imdur.  Patient has not had any recent follow-up with her Cardiologist regarding these concerns.  Plan: 1. Reviewed BP readings with patient and described that these are normal to slightly high and expected for her.   2.  Continue all medications. 3. Slowly change positions to prevent dizziness/falls 4. Continue imdur since chest pain is improving.  5. Follow-up this week with Dr. Humphrey Rolls Cardiology 6. Follow-up here prn.  - Time spent in direct consultation with patient via telemedicine about above concerns: 8 minutes  Follow up plan: Follow-up prn and with Cardiology.  Cassell Smiles, DNP, AGPCNP-BC Adult Gerontology Primary Care Nurse Practitioner [facility] 07/06/2018, 10:14 AM

## 2018-07-18 ENCOUNTER — Encounter: Payer: Self-pay | Admitting: Nurse Practitioner

## 2018-07-18 ENCOUNTER — Ambulatory Visit (INDEPENDENT_AMBULATORY_CARE_PROVIDER_SITE_OTHER): Payer: Medicare Other | Admitting: Nurse Practitioner

## 2018-07-18 ENCOUNTER — Other Ambulatory Visit: Payer: Self-pay

## 2018-07-18 VITALS — BP 143/72 | HR 79

## 2018-07-18 DIAGNOSIS — Z7189 Other specified counseling: Secondary | ICD-10-CM

## 2018-07-18 DIAGNOSIS — Z20828 Contact with and (suspected) exposure to other viral communicable diseases: Secondary | ICD-10-CM | POA: Diagnosis not present

## 2018-07-18 DIAGNOSIS — Z20822 Contact with and (suspected) exposure to covid-19: Secondary | ICD-10-CM

## 2018-07-18 NOTE — Progress Notes (Signed)
Telemedicine Encounter: Disclosed to patient at start of encounter that we will provide appropriate telemedicine services.  Patient consents to be treated via phone prior to discussion. - Patient is at her home and is accessed via telephone. - Services are provided by Cassell Smiles from [facility].  Subjective:    Patient ID: Helen Mccormick, female    DOB: Oct 04, 1940, 78 y.o.   MRN: [id number]  Helen Mccormick is a 78 y.o. female presenting on 07/18/2018 for Exposure to COVID-19 (Pt was around her grandson that was diagnose w/ COVID. x 4 days ago.. Asymptomatic)  HPI Exposure to Covid-19 Her grandson was tested and did have a positive test.  Patient has spoken with a health department nurse for screening.  - Patient was with her grandson for 30 minutes, was wearing gloves and mask and hat, jacket.  She came home and cleaned/washed her clothing.  Patient was closer than 6 feet from him during that time. - She currently denies fever, chills, sweats, shortness of breath, cough, sore throat, bodyaches. - She has had no other significant opportunity for exposure to Covid-19. - She has already asked family and friends not to come visit her.  Social History   Tobacco Use  . Smoking status: Never Smoker  . Smokeless tobacco: Never Used  Substance Use Topics  . Alcohol use: No  . Drug use: No    Review of Systems Per HPI unless specifically indicated above     Objective:    BP (!) 143/72   Pulse 79   Wt Readings from Last 3 Encounters:  05/31/18 155 lb (70.3 kg)  05/03/18 169 lb 6.4 oz (76.8 kg)  03/17/18 169 lb (76.7 kg)    Physical Exam Patient remotely monitored.  Verbal communication appropriate.  Cognition normal.   Results for orders placed or performed in visit on 06/21/18  CBC with Differential/Platelet  Result Value Ref Range   WBC 5.3 3.8 - 10.8 Thousand/uL   RBC 4.69 3.80 - 5.10 Million/uL   Hemoglobin 11.6 (L) 11.7 - 15.5 g/dL   HCT 36.0  35.0 - 45.0 %   MCV 76.8 (L) 80.0 - 100.0 fL   MCH 24.7 (L) 27.0 - 33.0 pg   MCHC 32.2 32.0 - 36.0 g/dL   RDW 15.8 (H) 11.0 - 15.0 %   Platelets 218 140 - 400 Thousand/uL   MPV 12.7 (H) 7.5 - 12.5 fL   Neutro Abs 2,279 1,500 - 7,800 cells/uL   Lymphs Abs 2,417 850 - 3,900 cells/uL   Absolute Monocytes 382 200 - 950 cells/uL   Eosinophils Absolute 201 15 - 500 cells/uL   Basophils Absolute 21 0 - 200 cells/uL   Neutrophils Relative % 43 %   Total Lymphocyte 45.6 %   Monocytes Relative 7.2 %   Eosinophils Relative 3.8 %   Basophils Relative 0.4 %  COMPLETE METABOLIC PANEL WITH GFR  Result Value Ref Range   Glucose, Bld 125 (H) 65 - 99 mg/dL   BUN 23 7 - 25 mg/dL   Creat 0.93 0.60 - 0.93 mg/dL   GFR, Est Non African American 60 > OR = 60 mL/min/1.37m2   GFR, Est African American 70 > OR = 60 mL/min/1.27m2   BUN/Creatinine Ratio NOT APPLICABLE 6 - 22 (calc)   Sodium 142 135 - 146 mmol/L   Potassium 3.9 3.5 - 5.3 mmol/L   Chloride 103 98 - 110 mmol/L   CO2 30 20 - 32 mmol/L   Calcium  9.3 8.6 - 10.4 mg/dL   Total Protein 6.8 6.1 - 8.1 g/dL   Albumin 3.9 3.6 - 5.1 g/dL   Globulin 2.9 1.9 - 3.7 g/dL (calc)   AG Ratio 1.3 1.0 - 2.5 (calc)   Total Bilirubin 0.4 0.2 - 1.2 mg/dL   Alkaline phosphatase (APISO) 156 (H) 37 - 153 U/L   AST 45 (H) 10 - 35 U/L   ALT 52 (H) 6 - 29 U/L  Hemoglobin A1c  Result Value Ref Range   Hgb A1c MFr Bld 6.6 (H) <5.7 % of total Hgb   Mean Plasma Glucose 143 (calc)   eAG (mmol/L) 7.9 (calc)  Lipid panel  Result Value Ref Range   Cholesterol 234 (H) <200 mg/dL   HDL 39 (L) > OR = 50 mg/dL   Triglycerides 222 (H) <150 mg/dL   LDL Cholesterol (Calc) 157 (H) mg/dL (calc)   Total CHOL/HDL Ratio 6.0 (H) <5.0 (calc)   Non-HDL Cholesterol (Calc) 195 (H) <130 mg/dL (calc)  TSH  Result Value Ref Range   TSH 1.49 0.40 - 4.50 mIU/L      Assessment & Plan:   Problem List Items Addressed This Visit    None    Visit Diagnoses    Close Exposure to  Covid-19 Virus    -  Primary   Relevant Orders   MYCHART COVID-19 HOME MONITORING PROGRAM   Advice Given About Covid-19 Virus by Telephone       Relevant Orders   [place]    Patient without active symptoms of Covid-19, but did have close exposure to patient with Covid-19 positive test.  Patient currently asymptomatic, but is at risk of developing Covid-19 and is patient with moderate risk of [id number] complications.  Plan: 1. Reviewed signs and symptoms of Covid-19 disease with patient. 2. Instructed patient to self-isolate at home for the next 10 days (total of 14 days from exposure).  It is not necessary at this time for strict quarantine.  Transition to strict quarantine if symptoms develop and call clinic. 3. Follow-up prn 5-7 days.  - Time spent in direct consultation with patient via telemedicine about above concerns: 8 minutes  Follow up plan: Return 5-7 days if symptoms worsen or fail to improve.  Cassell Smiles, DNP, AGPCNP-BC Adult Gerontology Primary Care Nurse Practitioner [facility] 07/18/2018, 10:38 AM

## 2018-07-24 ENCOUNTER — Other Ambulatory Visit: Payer: Self-pay | Admitting: Nurse Practitioner

## 2018-07-24 DIAGNOSIS — I208 Other forms of angina pectoris: Secondary | ICD-10-CM

## 2018-07-24 DIAGNOSIS — I2089 Other forms of angina pectoris: Secondary | ICD-10-CM

## 2018-08-04 ENCOUNTER — Telehealth: Payer: Self-pay

## 2018-08-04 NOTE — Telephone Encounter (Signed)
The pt called to verify her her medication. She state that she spoke with someone from her insurance company and they informed her that she was taking to much acid reflux medication. The pt states that she is currently taking omeprazole and pantoprazole. I informed the patient that the omeprazole was discontinued and she only should be taking pantoprazole. She verbalize understanding, no questions or concerns.

## 2018-08-19 ENCOUNTER — Other Ambulatory Visit: Payer: Self-pay | Admitting: Nurse Practitioner

## 2018-09-20 ENCOUNTER — Other Ambulatory Visit: Payer: Self-pay

## 2018-09-23 ENCOUNTER — Other Ambulatory Visit: Payer: Self-pay

## 2018-09-26 ENCOUNTER — Ambulatory Visit: Payer: Self-pay | Admitting: Nurse Practitioner

## 2018-09-26 ENCOUNTER — Ambulatory Visit (INDEPENDENT_AMBULATORY_CARE_PROVIDER_SITE_OTHER): Payer: Medicare Other | Admitting: Family Medicine

## 2018-09-26 ENCOUNTER — Other Ambulatory Visit: Payer: Self-pay

## 2018-09-26 ENCOUNTER — Encounter: Payer: Self-pay | Admitting: Family Medicine

## 2018-09-26 DIAGNOSIS — E1169 Type 2 diabetes mellitus with other specified complication: Secondary | ICD-10-CM | POA: Diagnosis not present

## 2018-09-26 DIAGNOSIS — I10 Essential (primary) hypertension: Secondary | ICD-10-CM | POA: Diagnosis not present

## 2018-09-26 DIAGNOSIS — D509 Iron deficiency anemia, unspecified: Secondary | ICD-10-CM

## 2018-09-26 NOTE — Patient Instructions (Addendum)
Thank you for coming to the office today.  Keep on track with blood pressure and blood sugar.   Please schedule a Follow-up Appointment to: Return in about 6 weeks (around 11/07/2018) for 4-6 week HTN, DM, Anemia check labs.  If you have any other questions or concerns, please feel free to call the office or send a message through [place]. You may also schedule an earlier appointment if necessary.  Additionally, you may be receiving a survey about your experience at our office within a few days to 1 week by e-mail or mail. We value your feedback.  Nobie Putnam, DO [place]

## 2018-09-26 NOTE — Progress Notes (Signed)
Virtual Visit via Telephone The purpose of this virtual visit is to provide medical care while limiting exposure to the novel coronavirus (COVID19) for both patient and office staff.  Consent was obtained for phone visit:  Yes.   Answered questions that patient had about telehealth interaction:  Yes.   I discussed the limitations, risks, security and privacy concerns of performing an evaluation and management service by telephone. I also discussed with the patient that there may be a patient responsible charge related to this service. The patient expressed understanding and agreed to proceed.  Patient Location: Home Provider Location: [facility] (Office)  PCP is Cassell Smiles, AGPCNP-BC - I am currently covering during her maternity leave.   ---------------------------------------------------------------------- Chief Complaint  Patient presents with  . Hypertension    140/57 mm/hg    S: Reviewed CMA documentation. I have called patient and gathered additional HPI as follows:  Hypertension - She is checking BP at home or outside of clinic. Readings recently are - 139/68 HR 60, 126/57 HR 68, 141/79 HR 67, 140/57, 120/56 HR 70. - She continues to be managed by Cardiologist Dr. Humphrey Rolls - Current medications: Amlodipine 5 mg once daily, HCTZ 12.5 mg once daily, isosorbide mononitrate 60 mg daily, tolerating well without side effects - She is not currently symptomatic.  - Pt denies headache, lightheadedness, dizziness, changes in vision, chest tightness/pressure, palpitations, leg swelling, sudden loss of speech or loss of consciousness.  Diabetes Pt presents today for follow up of Type 2 diabetes mellitus. CBGs 121, 154, 140 avg - Current diabetic medications include: metformin - She is not currently symptomatic.  - She denies polydipsia, polyphagia, polyuria, headaches, diaphoresis, shakiness, chills, pain, numbness or tingling in extremities and changes in vision.  Had  some sensation of being cold, hot last week.  Blood sugar was a little lower.  - Clinical course has been stable. - She reports an exercise routine that includes walking - Her diet is low in salt, low in fat, and low in carbohydrates. - Weight trend: stable  PMH Anemia, microcytic  Past Medical History:  Diagnosis Date  . Allergy   . Anxiety   . Colon polyp   . Depression   . GERD (gastroesophageal reflux disease)   . Heart murmur   . Hyperlipidemia   . Hypertension   . Sleep apnea   . Thyroid disease    Social History   Tobacco Use  . Smoking status: Never Smoker  . Smokeless tobacco: Never Used  Substance Use Topics  . Alcohol use: No  . Drug use: No    Current Outpatient Medications:  .  albuterol (PROVENTIL HFA;VENTOLIN HFA) 108 (90 Base) MCG/ACT inhaler, Inhale into the lungs every 6 (six) hours as needed for wheezing or shortness of breath., Disp: , Rfl:  .  amLODipine (NORVASC) 5 MG tablet, TAKE 1 TABLET(5 MG) BY MOUTH DAILY FOR BLOOD PRESSURE, Disp: 90 tablet, Rfl: 1 .  Ascorbic Acid (VITAMIN C) 1000 MG tablet, Take 1,000 mg by mouth daily., Disp: , Rfl:  .  aspirin 81 MG chewable tablet, Chew 81 mg by mouth daily. , Disp: , Rfl:  .  atorvastatin (LIPITOR) 40 MG tablet, Take 1 tablet (40 mg total) by mouth at bedtime. For Cholesterol, Disp: 90 tablet, Rfl: 1 .  calcium carbonate (CALCIUM 600) 600 MG TABS tablet, Take 600 mg by mouth 2 (two) times daily with a meal., Disp: , Rfl:  .  cholecalciferol (VITAMIN D) 1000 units tablet, Take 1  tablet by mouth daily. , Disp: , Rfl:  .  dicyclomine (BENTYL) 10 MG capsule, Take 1 capsule three times daily with meals as needed for abdominal pain or cramping with diarrhea., Disp: 90 capsule, Rfl: 6 .  escitalopram (LEXAPRO) 10 MG tablet, TAKE 1 TABLET BY MOUTH EVERY DAY FOR DEPRESSION, Disp: 90 tablet, Rfl: 1 .  Flaxseed, Linseed, (FLAX SEED OIL) 1000 MG CAPS, Take by mouth., Disp: , Rfl:  .  fluticasone (FLONASE) 50 MCG/ACT  nasal spray, Place 2 sprays into both nostrils daily., Disp: , Rfl:  .  glucose blood test strip, Use 1 strip to check your blood sugar two times daily., Disp: 50 each, Rfl: 12 .  hydrochlorothiazide (HYDRODIURIL) 12.5 MG tablet, Take 1 tablet (12.5 mg total) by mouth daily. FOR BLOOD PRESSURE, Disp: 90 tablet, Rfl: 1 .  isosorbide mononitrate (IMDUR) 30 MG 24 hr tablet, TAKE 1 TABLET(30 MG) BY MOUTH DAILY (Patient taking differently: No sig reported), Disp: 30 tablet, Rfl: 2 .  levothyroxine (SYNTHROID, LEVOTHROID) 75 MCG tablet, TAKE 1 TABLET BY MOUTH DAILY BEFORE BREAKFAST FOR THYROID, Disp: 90 tablet, Rfl: 1 .  meloxicam (MOBIC) 15 MG tablet, meloxicam 15 mg tablet  Take 1 tablet every day by oral route., Disp: , Rfl:  .  metFORMIN (GLUCOPHAGE-XR) 500 MG 24 hr tablet, Take 1 tablet (500 mg total) by mouth 2 (two) times daily with a meal., Disp: 180 tablet, Rfl: 1 .  Multiple Vitamins-Minerals (PRESERVISION AREDS 2 PO), Take by mouth., Disp: , Rfl:  .  nitroGLYCERIN (NITROSTAT) 0.4 MG SL tablet, Place 1 tablet (0.4 mg total) under the tongue every 5 (five) minutes as needed for chest pain., Disp: 50 tablet, Rfl: 3 .  Omega-3 1000 MG CAPS, Take 1 capsule by mouth daily. , Disp: , Rfl:  .  pantoprazole (PROTONIX) 20 MG tablet, Take 1 tablet (20 mg total) by mouth daily. FOR ACID REFLUX AND STOMACH PAIN, Disp: 90 tablet, Rfl: 1 .  tiZANidine (ZANAFLEX) 2 MG tablet, TAKE 1 TABLET(2 MG) BY MOUTH TWICE DAILY, Disp: , Rfl:  .  carvedilol (COREG) 3.125 MG tablet, , Disp: , Rfl:  .  ENTRESTO 24-26 MG, , Disp: , Rfl:  .  Fluocinolone Acetonide Body 0.01 % OIL, Apply 1-2x daily for flares of itching of the scalp, Disp: , Rfl:  .  sucralfate (CARAFATE) 1 g tablet, Take 1 tablet (1 g total) by mouth 4 (four) times daily for 20 days., Disp: 80 tablet, Rfl: 0  Depression screen [facility] 2/9 09/26/2018 06/21/2018 05/03/2018  Decreased Interest 0 0 0  Down, Depressed, Hopeless 1 0 0  PHQ - 2 Score 1 0 0  Altered  sleeping 0 0 -  Tired, decreased energy 0 0 -  Change in appetite 0 0 -  Feeling bad or failure about yourself  2 0 -  Trouble concentrating 0 0 -  Moving slowly or fidgety/restless 0 0 -  Suicidal thoughts 0 0 -  PHQ-9 Score 3 0 -  Difficult doing work/chores Not difficult at all Not difficult at all -    GAD 7 : Generalized Anxiety Score 06/21/2018 03/12/2017  Nervous, Anxious, on Edge 0 1  Control/stop worrying 0 1  Worry too much - different things 0 2  Trouble relaxing 0 0  Restless 0 0  Easily annoyed or irritable 0 2  Afraid - awful might happen 0 1  Total GAD 7 Score 0 7  Anxiety Difficulty Not difficult at all Not difficult at all    --------------------------------------------------------------------------  O: No physical exam performed due to remote telephone encounter.  Lab results reviewed.  No results found for this or any previous visit (from the past 2160 hour(s)).  Recent Labs    11/30/17 1705 03/17/18 1117 06/24/18 0938  HGBA1C 6.8* 6.6* 6.6*    -------------------------------------------------------------------------- A&P:  Problem List Items Addressed This Visit    DM (diabetes mellitus), type 2 ([place]) Controlled Last A1c 6.6 stable in 05/2018 Continue current Metformin XR 500 BID Lifestyle F/u in 4-6 weeks can repeat A1c with labs    Hypertension - Primary Controlled HTN based on readings Followed by Cardiology On management for CHF Continue current regimen without change    Relevant Medications   ENTRESTO 24-26 MG   carvedilol (COREG) 3.125 MG tablet   Microcytic anemia Previously addressed on labs before Patient was anticipated to get lab today Hemoglobin electrophoresis and peripheral smear, however she did not make it in for her lab apt.  Advised that she can follow-up in person in 4-6 weeks w/ PCP to re-check lab panel as requested including A1c at that time and other labs if needed for anemia.      No orders of the defined types  were placed in this encounter.   Follow-up: - Return in 4-6 week HTN, DM, Anemia check labs  Patient verbalizes understanding with the above medical recommendations including the limitation of remote medical advice.  Specific follow-up and call-back criteria were given for patient to follow-up or seek medical care more urgently if needed.   - Time spent in direct consultation with patient on phone: 9 minutes   Nobie Putnam, [facility] 09/26/2018, 9:01 AM

## 2018-11-07 ENCOUNTER — Other Ambulatory Visit: Payer: Self-pay | Admitting: Nurse Practitioner

## 2018-11-07 DIAGNOSIS — I10 Essential (primary) hypertension: Secondary | ICD-10-CM

## 2018-11-07 DIAGNOSIS — E1169 Type 2 diabetes mellitus with other specified complication: Secondary | ICD-10-CM

## 2018-11-07 DIAGNOSIS — E785 Hyperlipidemia, unspecified: Secondary | ICD-10-CM

## 2018-11-07 DIAGNOSIS — D509 Iron deficiency anemia, unspecified: Secondary | ICD-10-CM

## 2018-11-08 ENCOUNTER — Ambulatory Visit: Payer: Medicare Other | Admitting: Nurse Practitioner

## 2018-11-08 ENCOUNTER — Other Ambulatory Visit: Payer: Self-pay

## 2018-11-08 ENCOUNTER — Other Ambulatory Visit: Payer: Medicaid Other

## 2018-11-09 LAB — COMPLETE METABOLIC PANEL WITH GFR
AG Ratio: 1.5 (calc) (ref 1.0–2.5)
ALT: 14 U/L (ref 6–29)
AST: 19 U/L (ref 10–35)
Albumin: 4.1 g/dL (ref 3.6–5.1)
Alkaline phosphatase (APISO): 76 U/L (ref 37–153)
BUN/Creatinine Ratio: 24 (calc) — ABNORMAL HIGH (ref 6–22)
BUN: 23 mg/dL (ref 7–25)
CO2: 29 mmol/L (ref 20–32)
Calcium: 9.6 mg/dL (ref 8.6–10.4)
Chloride: 104 mmol/L (ref 98–110)
Creat: 0.97 mg/dL — ABNORMAL HIGH (ref 0.60–0.93)
GFR, Est African American: 66 mL/min/{1.73_m2} (ref >=60)
GFR, Est Non African American: 57 mL/min/{1.73_m2} — ABNORMAL LOW (ref >=60)
Globulin: 2.8 g/dL (calc) (ref 1.9–3.7)
Glucose, Bld: 111 mg/dL — ABNORMAL HIGH (ref 65–99)
Potassium: 4.2 mmol/L (ref 3.5–5.3)
Sodium: 140 mmol/L (ref 135–146)
Total Bilirubin: 0.5 mg/dL (ref 0.2–1.2)
Total Protein: 6.9 g/dL (ref 6.1–8.1)

## 2018-11-09 LAB — CBC WITH DIFFERENTIAL/PLATELET
Absolute Monocytes: 449 cells/uL (ref 200–950)
Basophils Absolute: 20 cells/uL (ref 0–200)
Basophils Relative: 0.3 %
Eosinophils Absolute: 261 cells/uL (ref 15–500)
Eosinophils Relative: 3.9 %
HCT: 35.3 % (ref 35.0–45.0)
Hemoglobin: 11 g/dL — ABNORMAL LOW (ref 11.7–15.5)
Lymphs Abs: 3142 cells/uL (ref 850–3900)
MCH: 23.9 pg — ABNORMAL LOW (ref 27.0–33.0)
MCHC: 31.2 g/dL — ABNORMAL LOW (ref 32.0–36.0)
MCV: 76.6 fL — ABNORMAL LOW (ref 80.0–100.0)
MPV: 11.9 fL (ref 7.5–12.5)
Monocytes Relative: 6.7 %
Neutro Abs: 2827 cells/uL (ref 1500–7800)
Neutrophils Relative %: 42.2 %
Platelets: 202 10*3/uL (ref 140–400)
RBC: 4.61 10*6/uL (ref 3.80–5.10)
RDW: 16.4 % — ABNORMAL HIGH (ref 11.0–15.0)
Total Lymphocyte: 46.9 %
WBC: 6.7 10*3/uL (ref 3.8–10.8)

## 2018-11-09 LAB — LIPID PANEL
Cholesterol: 121 mg/dL (ref <200)
HDL: 43 mg/dL — ABNORMAL LOW (ref >=50)
LDL Cholesterol (Calc): 60 mg/dL (calc)
Non-HDL Cholesterol (Calc): 78 mg/dL (calc) (ref <130)
Total CHOL/HDL Ratio: 2.8 (calc) (ref <5.0)
Triglycerides: 97 mg/dL (ref <150)

## 2018-11-09 LAB — HEMOGLOBIN A1C
Hgb A1c MFr Bld: 7.2 % of total Hgb — ABNORMAL HIGH (ref <5.7)
Mean Plasma Glucose: 160 (calc)
eAG (mmol/L): 8.9 (calc)

## 2018-11-09 LAB — PATHOLOGIST SMEAR REVIEW

## 2018-11-10 ENCOUNTER — Other Ambulatory Visit: Payer: Self-pay | Admitting: Nurse Practitioner

## 2018-11-11 ENCOUNTER — Ambulatory Visit (INDEPENDENT_AMBULATORY_CARE_PROVIDER_SITE_OTHER): Payer: Medicare Other | Admitting: Nurse Practitioner

## 2018-11-11 ENCOUNTER — Other Ambulatory Visit: Payer: Self-pay

## 2018-11-11 ENCOUNTER — Encounter: Payer: Self-pay | Admitting: Nurse Practitioner

## 2018-11-11 DIAGNOSIS — E1169 Type 2 diabetes mellitus with other specified complication: Secondary | ICD-10-CM

## 2018-11-11 NOTE — Progress Notes (Signed)
Telemedicine Encounter: Disclosed to patient at start of encounter that we will provide appropriate telemedicine services.  Patient consents to be treated via phone prior to discussion. - Patient is at her home and is accessed via telephone. - Services are provided by Cassell Smiles from [facility]  Subjective:    Patient ID: Helen Mccormick, female    DOB: 1941-02-21, 78 y.o.   MRN: [id number]  Monice Lundy Baldus is a 78 y.o. female presenting on 11/11/2018 for Diabetes (discuss lab results )  HPI Diabetes Pt presents today for follow up of Type 2 diabetes mellitus. She is not checking CBG at home - Current diabetic medications include: metformin - She is not currently symptomatic.  - She denies polydipsia, polyphagia, polyuria, headaches, diaphoresis, shakiness, chills, pain, numbness or tingling in extremities and changes in vision.   - Clinical course has been worsening. - Patient has not been as active due to heat/sun exposure.  She had a rash.  Now is trying to get healthier foods.  Patient is trying to get good fish. Is starting to walk again in mornings.  Patient is trying to exercise some in her house, but admits it is not enough.  Patient usually walks 7 labs around the field, but today was able to walk 10 labs. - Her diet is moderate in salt, moderate in fat, and moderate in carbohydrates.  - Weight trend: stable    PREVENTION: Eye exam current (within one year): no Foot exam current (within one year): no  Lipid/ASCVD risk reduction - on statin: yes Kidney protection - on ace or arb: yes  Recent Labs    11/30/17 1705 03/17/18 1117 06/24/18 0938 11/08/18 0806  HGBA1C 6.8* 6.6* 6.6* 7.2*   Social History   Tobacco Use  . Smoking status: Never Smoker  . Smokeless tobacco: Never Used  Substance Use Topics  . Alcohol use: No  . Drug use: No   Review of Systems Per HPI unless specifically indicated above     Objective:    There were no vitals  taken for this visit.  Wt Readings from Last 3 Encounters:  05/31/18 155 lb (70.3 kg)  05/03/18 169 lb 6.4 oz (76.8 kg)  03/17/18 169 lb (76.7 kg)    Physical Exam Patient remotely monitored.  Verbal communication appropriate.  Cognition normal.  Results for orders placed or performed in visit on 11/07/18  CBC with Differential/Platelet  Result Value Ref Range   WBC 6.7 3.8 - 10.8 Thousand/uL   RBC 4.61 3.80 - 5.10 Million/uL   Hemoglobin 11.0 (L) 11.7 - 15.5 g/dL   HCT 35.3 35.0 - 45.0 %   MCV 76.6 (L) 80.0 - 100.0 fL   MCH 23.9 (L) 27.0 - 33.0 pg   MCHC 31.2 (L) 32.0 - 36.0 g/dL   RDW 16.4 (H) 11.0 - 15.0 %   Platelets 202 140 - 400 Thousand/uL   MPV 11.9 7.5 - 12.5 fL   Neutro Abs 2,827 1,500 - 7,800 cells/uL   Lymphs Abs 3,142 850 - 3,900 cells/uL   Absolute Monocytes 449 200 - 950 cells/uL   Eosinophils Absolute 261 15 - 500 cells/uL   Basophils Absolute 20 0 - 200 cells/uL   Neutrophils Relative % 42.2 %   Total Lymphocyte 46.9 %   Monocytes Relative 6.7 %   Eosinophils Relative 3.9 %   Basophils Relative 0.3 %  Pathologist smear review  Result Value Ref Range   Path Review    COMPLETE  METABOLIC PANEL WITH GFR  Result Value Ref Range   Glucose, Bld 111 (H) 65 - 99 mg/dL   BUN 23 7 - 25 mg/dL   Creat 0.97 (H) 0.60 - 0.93 mg/dL   GFR, Est Non African American 57 (L) > OR = 60 mL/min/1.57m2   GFR, Est African American 66 > OR = 60 mL/min/1.73m2   BUN/Creatinine Ratio 24 (H) 6 - 22 (calc)   Sodium 140 135 - 146 mmol/L   Potassium 4.2 3.5 - 5.3 mmol/L   Chloride 104 98 - 110 mmol/L   CO2 29 20 - 32 mmol/L   Calcium 9.6 8.6 - 10.4 mg/dL   Total Protein 6.9 6.1 - 8.1 g/dL   Albumin 4.1 3.6 - 5.1 g/dL   Globulin 2.8 1.9 - 3.7 g/dL (calc)   AG Ratio 1.5 1.0 - 2.5 (calc)   Total Bilirubin 0.5 0.2 - 1.2 mg/dL   Alkaline phosphatase (APISO) 76 37 - 153 U/L   AST 19 10 - 35 U/L   ALT 14 6 - 29 U/L  Lipid panel  Result Value Ref Range   Cholesterol 121 <200 mg/dL    HDL 43 (L) > OR = 50 mg/dL   Triglycerides 97 <150 mg/dL   LDL Cholesterol (Calc) 60 mg/dL (calc)   Total CHOL/HDL Ratio 2.8 <5.0 (calc)   Non-HDL Cholesterol (Calc) 78 <130 mg/dL (calc)  Hemoglobin A1c  Result Value Ref Range   Hgb A1c MFr Bld 7.2 (H) <5.7 % of total Hgb   Mean Plasma Glucose 160 (calc)   eAG (mmol/L) 8.9 (calc)      Assessment & Plan:   Problem List Items Addressed This Visit      Endocrine   DM (diabetes mellitus), type 2 (Helen Mccormick) with other complication, without long term insulin UncontrolledDM with A1c 7.2% Worsening control from last visit and goal A1c < 7.0%. - Complications - dyslipidemia and hyperglycemia.  Plan:  1. Continue current therapy: metformin 500 mg bid 2. Encourage improved lifestyle: - low carb/low glycemic diet reinforced prior education - Increase physical activity to 30 minutes most days of the week.  Explained that increased physical activity increases body's use of sugar for energy.  The decrease in activity is likely the cause of poorer control. 3. Check fasting am CBG and bring log to next visit for review 4. Continue ASA, ACEi and Statin 5. Advised to schedule DM ophtho exam, send record. 6. Follow-up 3 months in person.    Relevant Medications   rosuvastatin (CRESTOR) 40 MG tablet   metFORMIN (GLUCOPHAGE-XR) 500 MG 24 hr tablet       Meds ordered this encounter  Medications  . metFORMIN (GLUCOPHAGE-XR) 500 MG 24 hr tablet    Sig: Take 1 tablet (500 mg total) by mouth 2 (two) times daily with a meal.    Dispense:  180 tablet    Refill:  1    Order Specific Question:   Supervising Provider    Answer:   Olin Hauser [[id number]]   - Time spent in direct consultation with patient via telemedicine about above concerns: 8 minutes  Follow up plan: Return in about 3 months (around 02/11/2019) for diabetes with A1c.  Cassell Smiles, DNP, AGPCNP-BC Adult Gerontology Primary Care Nurse Practitioner [facility] 11/11/2018, 8:31 AM

## 2018-11-14 ENCOUNTER — Encounter: Payer: Self-pay | Admitting: Nurse Practitioner

## 2018-11-14 MED ORDER — METFORMIN HCL ER 500 MG PO TB24: 500.0000 mg | ORAL_TABLET | Freq: Two times a day (BID) | ORAL | 1 refills | Status: DC

## 2018-12-12 ENCOUNTER — Telehealth: Payer: Self-pay | Admitting: Nurse Practitioner

## 2018-12-12 NOTE — Telephone Encounter (Signed)
Documents received for new CPAP machine order.  This will require visit for patient for [name] to address prior CPAP use.  Please schedule appointment.  This can be virtual

## 2018-12-12 NOTE — Telephone Encounter (Signed)
Received a call from [facility] requesting someone to call about a CPAP machine [phone].

## 2018-12-12 NOTE — Telephone Encounter (Signed)
The pt was notified and appt scheduled. 

## 2018-12-15 ENCOUNTER — Ambulatory Visit (INDEPENDENT_AMBULATORY_CARE_PROVIDER_SITE_OTHER): Payer: Medicare Other | Admitting: Nurse Practitioner

## 2018-12-15 ENCOUNTER — Encounter: Payer: Self-pay | Admitting: Nurse Practitioner

## 2018-12-15 ENCOUNTER — Other Ambulatory Visit: Payer: Self-pay

## 2018-12-15 VITALS — BP 100/41 | HR 70 | Ht 64.0 in | Wt 174.6 lb

## 2018-12-15 DIAGNOSIS — E1169 Type 2 diabetes mellitus with other specified complication: Secondary | ICD-10-CM

## 2018-12-15 DIAGNOSIS — Z23 Encounter for immunization: Secondary | ICD-10-CM | POA: Diagnosis not present

## 2018-12-15 DIAGNOSIS — G4733 Obstructive sleep apnea (adult) (pediatric): Secondary | ICD-10-CM | POA: Insufficient documentation

## 2018-12-15 NOTE — Patient Instructions (Addendum)
Burnard Leigh Kallal,   Thank you for coming in to clinic today.  1. Continue use of CPAP.  WE will send your order.  2. Nurse and pharmacist will help you with diabetes.  They will call you to set up phone visits.  Please schedule a follow-up appointment with Cassell Smiles, AGNP. Return if symptoms worsen or fail to improve AND in 2 months for Diabetes.  If you have any other questions or concerns, please feel free to call the clinic or send a message through [place]. You may also schedule an earlier appointment if necessary.  You will receive a survey after today's visit either digitally by e-mail or paper by [employer]. Your experiences and feedback matter to [name].  Please respond so we know how we are doing as we provide care for you.  Cassell Smiles, DNP, AGNP-BC Adult Gerontology Nurse Practitioner [place]

## 2018-12-15 NOTE — Progress Notes (Signed)
Subjective:    Patient ID: Helen Mccormick, female    DOB: 10/29/1940, 78 y.o.   MRN: [id number]  Helen Mccormick is a 78 y.o. female presenting on 12/15/2018 for Sleep Apnea (Requesting a order for a CPAP machine)   HPI Sleep Apnea  Patient uses CPAP every night.  Goes to bed at 10pm, wakes around 7am and removes when waking up.  If interrupted for urination, puts CPAP right back on.  She has used her CPAP for years and "cannot sleep without it."  She has improved blood pressure control when wearing her CPAP machine to control sleep apnea.  Patient does not wake during night very often. - Does not use during naps. Only sleep 5 minutes, but has not been previously instructed/remembered instruction to use during all sleep, including naps.  Diabetes Metformin stopped due to abdominal pain.  Prescribed metformin ER to avoid side effects, but patient stopped this about 5 months ago because it caused diarrhea, loose bm, nausea, vomiting.  Since stopping, she has had all GI symptoms resolve and has less sleepiness.  Patient started having cramping about 1 week after starting the medication in March 2020. - A1c climbing from 6.6% to 7.2% off medication.  Patient was not taking it at last visit and 7.2% reflects diet and exercise control.  Decision at that time was to avoid escalation of therapy.  Social History   Tobacco Use  . Smoking status: Never Smoker  . Smokeless tobacco: Never Used  Substance Use Topics  . Alcohol use: No  . Drug use: No    Review of Systems Per HPI unless specifically indicated above     Objective:    BP (!) 100/41 (BP Location: Right Arm, Patient Position: Sitting, Cuff Size: Normal)   Pulse 70   Ht 5\' 4"  (1.626 m)   Wt 174 lb 9.6 oz (79.2 kg)   BMI 29.97 kg/m   Wt Readings from Last 3 Encounters:  12/15/18 174 lb 9.6 oz (79.2 kg)  05/31/18 155 lb (70.3 kg)  05/03/18 169 lb 6.4 oz (76.8 kg)    Physical Exam Vitals signs reviewed.  Constitutional:       General: She is not in acute distress.    Appearance: Normal appearance. She is well-developed.  HENT:     Head: Normocephalic and atraumatic.  Skin:    General: Skin is warm and dry.  Neurological:     General: No focal deficit present.     Mental Status: She is alert and oriented to person, place, and time. Mental status is at baseline.  Psychiatric:        Mood and Affect: Mood normal.        Behavior: Behavior normal.        Thought Content: Thought content normal.        Judgment: Judgment normal.    Results for orders placed or performed in visit on 11/07/18  CBC with Differential/Platelet  Result Value Ref Range   WBC 6.7 3.8 - 10.8 Thousand/uL   RBC 4.61 3.80 - 5.10 Million/uL   Hemoglobin 11.0 (L) 11.7 - 15.5 g/dL   HCT 35.3 35.0 - 45.0 %   MCV 76.6 (L) 80.0 - 100.0 fL   MCH 23.9 (L) 27.0 - 33.0 pg   MCHC 31.2 (L) 32.0 - 36.0 g/dL   RDW 16.4 (H) 11.0 - 15.0 %   Platelets 202 140 - 400 Thousand/uL   MPV 11.9 7.5 - 12.5 fL   Neutro  Abs 2,827 1,500 - 7,800 cells/uL   Lymphs Abs 3,142 850 - 3,900 cells/uL   Absolute Monocytes 449 200 - 950 cells/uL   Eosinophils Absolute 261 15 - 500 cells/uL   Basophils Absolute 20 0 - 200 cells/uL   Neutrophils Relative % 42.2 %   Total Lymphocyte 46.9 %   Monocytes Relative 6.7 %   Eosinophils Relative 3.9 %   Basophils Relative 0.3 %  Pathologist smear review  Result Value Ref Range   Path Review    COMPLETE METABOLIC PANEL WITH GFR  Result Value Ref Range   Glucose, Bld 111 (H) 65 - 99 mg/dL   BUN 23 7 - 25 mg/dL   Creat 0.97 (H) 0.60 - 0.93 mg/dL   GFR, Est Non African American 57 (L) > OR = 60 mL/min/1.51m2   GFR, Est African American 66 > OR = 60 mL/min/1.30m2   BUN/Creatinine Ratio 24 (H) 6 - 22 (calc)   Sodium 140 135 - 146 mmol/L   Potassium 4.2 3.5 - 5.3 mmol/L   Chloride 104 98 - 110 mmol/L   CO2 29 20 - 32 mmol/L   Calcium 9.6 8.6 - 10.4 mg/dL   Total Protein 6.9 6.1 - 8.1 g/dL   Albumin 4.1 3.6 - 5.1  g/dL   Globulin 2.8 1.9 - 3.7 g/dL (calc)   AG Ratio 1.5 1.0 - 2.5 (calc)   Total Bilirubin 0.5 0.2 - 1.2 mg/dL   Alkaline phosphatase (APISO) 76 37 - 153 U/L   AST 19 10 - 35 U/L   ALT 14 6 - 29 U/L  Lipid panel  Result Value Ref Range   Cholesterol 121 <200 mg/dL   HDL 43 (L) > OR = 50 mg/dL   Triglycerides 97 <150 mg/dL   LDL Cholesterol (Calc) 60 mg/dL (calc)   Total CHOL/HDL Ratio 2.8 <5.0 (calc)   Non-HDL Cholesterol (Calc) 78 <130 mg/dL (calc)  Hemoglobin A1c  Result Value Ref Range   Hgb A1c MFr Bld 7.2 (H) <5.7 % of total Hgb   Mean Plasma Glucose 160 (calc)   eAG (mmol/L) 8.9 (calc)      Assessment & Plan:   Problem List Items Addressed This Visit      Respiratory   Obstructive sleep apnea syndrome  -  Primary Stable on CPAP machine.  Patient needing replacement machine and supplies.  Use is 100% of time at night.  Never uses with naps, but does not regularly nap.   - Continue with replacement of machine and supplies as coordinated with [place].   - Follow-up 1 year prn.      Endocrine   DM (diabetes mellitus), type 2 ([place]) Slight worsening on evaluation 10/2018.  Patient informed us today she has not been taking metformin as previously requested and assumed based on 10/2018 med rec in office.  Hypoglycemia and hyperglycemia is complicating patient's symptoms and control.  Please help patient with DM control using lifestyle.  May consider adding SGLT2 inhibitor in future if needing pharmacologic management.  Would like to avoid additional medications due to patient's pre-existing polypharmacy.  FOLLOW-UP 3 months after last DM visit in November 2020.   Relevant Orders   Referral to Chronic Care Management Services    Other Visit Diagnoses    Need for immunization against influenza      Relevant Orders   Flu Vaccine QUAD High Dose(Fluad)      Follow up plan: Return if symptoms worsen or fail to improve AND in  2 months for Diabetes.  Cassell Smiles,  DNP, AGPCNP-BC Adult Gerontology Primary Care Nurse Practitioner [facility] 12/15/2018, 1:25 PM

## 2018-12-16 ENCOUNTER — Ambulatory Visit: Payer: Self-pay | Admitting: Pharmacist

## 2018-12-16 DIAGNOSIS — E1169 Type 2 diabetes mellitus with other specified complication: Secondary | ICD-10-CM

## 2018-12-16 NOTE — Chronic Care Management (AMB) (Signed)
  Chronic Care Management   Note  12/16/2018 Name: Helen Mccormick MRN: [id number] DOB: Jun 15, 1940   Subjective:   Helen Mccormick is a 78 y.o. year old female who is a primary care patient of [name], NP. The CM team was consulted for assistance with chronic disease management and care coordination related to diabetes management and medication review  I reached out to [place] by phone today. Appointment scheduled for next week.   Plan:  Telephone follow up appointment with care management team member scheduled for: 9/22 at 3 pm  Harlow Asa, PharmD, [place] [phone]

## 2018-12-20 ENCOUNTER — Ambulatory Visit (INDEPENDENT_AMBULATORY_CARE_PROVIDER_SITE_OTHER): Payer: Medicare Other | Admitting: Pharmacist

## 2018-12-20 DIAGNOSIS — E1169 Type 2 diabetes mellitus with other specified complication: Secondary | ICD-10-CM

## 2018-12-20 DIAGNOSIS — I10 Essential (primary) hypertension: Secondary | ICD-10-CM | POA: Diagnosis not present

## 2018-12-20 NOTE — Chronic Care Management (AMB) (Signed)
Chronic Care Management   Note  12/20/2018 Name: Helen Mccormick MRN: [id number] DOB: 04-28-1940   Subjective:   Helen Mccormick is a 78 y.o. year old female who is a primary care patient of [name], NP. The CM team was consulted for assistance with chronic disease management and care coordination.   I reached out to [place] by phone today.   Ms. Brunetti was given information about Chronic Care Management services today including:  1. CCM service includes personalized support from designated clinical staff supervised by her physician, including individualized plan of care and coordination with other care providers 2. 24/7 contact phone numbers for assistance for urgent and routine care needs. 3. Service will only be billed when office clinical staff spend 20 minutes or more in a month to coordinate care. 4. Only one practitioner may furnish and bill the service in a calendar month. 5. The patient may stop CCM services at any time (effective at the end of the month) by phone call to the office staff. 6. The patient will be responsible for cost sharing (co-pay) of up to 20% of the service fee (after annual deductible is met).  Patient agreed to services and verbal consent obtained.   Review of patient status, including review of consultants reports, laboratory and other test data, was performed as part of comprehensive evaluation and provision of chronic care management services.   SDOH (Social Determinants of Health) screening performed today: Physical Activity. See Care Plan for related entries.   Objective:  Lab Results  Component Value Date   CREATININE 0.97 (H) 11/08/2018   CREATININE 0.93 06/24/2018   CREATININE 0.83 05/31/2018    Lab Results  Component Value Date   HGBA1C 7.2 (H) 11/08/2018       Component Value Date/Time   CHOL 121 11/08/2018 0806   TRIG 97 11/08/2018 0806   HDL 43 (L) 11/08/2018 0806   CHOLHDL 2.8 11/08/2018 0806   VLDL  23 08/16/2017 0528   LDLCALC 60 11/08/2018 0806    BP Readings from Last 3 Encounters:  12/15/18 (!) 100/41  07/18/18 (!) 143/72  05/31/18 139/62    Allergies  Allergen Reactions  . Amlodipine Hives and Swelling  . Etodolac     Other reaction(s): Abdominal Pain, Other (See Comments)  . Tramadol Other (See Comments)    dizziness     Medications Reviewed Today    Reviewed by [name], NP (Nurse Practitioner) on 12/15/18 at 1354  Med List Status: <None>  Medication Order Taking? Sig Documenting Provider Last Dose Status Informant  albuterol (PROVENTIL HFA;VENTOLIN HFA) 108 (90 Base) MCG/ACT inhaler [id number] Yes Inhale into the lungs every 6 (six) hours as needed for wheezing or shortness of breath. [provider] Taking Active Self  amLODipine (NORVASC) 5 MG tablet [id number] Yes TAKE 1 TABLET(5 MG) BY MOUTH DAILY FOR BLOOD PRESSURE [name], NP Taking Active   Ascorbic Acid (VITAMIN C) 1000 MG tablet [id number] Yes Take 1,000 mg by mouth daily. [provider] Taking Active   aspirin 81 MG chewable tablet [id number] Yes Chew 81 mg by mouth daily.  [provider] Taking Active Self  calcium carbonate (CALCIUM 600) 600 MG TABS tablet [id number] Yes Take 600 mg by mouth 2 (two) times daily with a meal. [provider] Taking Active   carvedilol (COREG) 3.125 MG tablet [id number] Yes  [provider] Taking Active   cholecalciferol (VITAMIN D) 1000 units tablet [id number] Yes Take 1  tablet by mouth daily.  [provider] Taking Active Self  dicyclomine (BENTYL) 10 MG capsule [id number] Yes Take 1 capsule three times daily with meals as needed for abdominal pain or cramping with diarrhea. [name], NP Taking Active   ENTRESTO 24-26 MG [id number] Yes 1 tablet 2 (two) times daily.  [provider] Taking Active   escitalopram (LEXAPRO) 10 MG tablet [id number] Yes TAKE 1 TABLET BY MOUTH EVERY DAY  FOR DEPRESSION [name], NP Taking Active   Flaxseed, Linseed, (FLAX SEED OIL) 1000 MG CAPS [id number] Yes Take by mouth. [provider] Taking Active   Fluocinolone Acetonide Body 0.01 % OIL [id number] No Apply 1-2x daily for flares of itching of the scalp [provider] Not Taking Active   fluticasone (FLONASE) 50 MCG/ACT nasal spray [id number] Yes Place 2 sprays into both nostrils daily. [provider] Taking Active Self  glucose blood test strip [id number] Yes Use 1 strip to check your blood sugar two times daily. [name], NP Taking Active   hydrochlorothiazide (HYDRODIURIL) 12.5 MG tablet [id number] Yes Take 1 tablet (12.5 mg total) by mouth daily. FOR BLOOD PRESSURE [name], NP Taking Active   isosorbide mononitrate (IMDUR) 30 MG 24 hr tablet [id number] Yes TAKE 1 TABLET(30 MG) BY MOUTH DAILY  Patient taking differently: No sig reported   Olin Hauser, DO Taking Active   levothyroxine (SYNTHROID, LEVOTHROID) 75 MCG tablet [id number] Yes TAKE 1 TABLET BY MOUTH DAILY BEFORE BREAKFAST FOR THYROID [name], NP Taking Active   meloxicam (MOBIC) 15 MG tablet [id number] Yes meloxicam 15 mg tablet  Take 1 tablet every day by oral route. [provider] Taking Active   metFORMIN (GLUCOPHAGE-XR) 500 MG 24 hr tablet [id number] No Take 1 tablet (500 mg total) by mouth 2 (two) times daily with a meal.  Patient not taking: Reported on 12/15/2018   [name], NP Not Taking Active   Multiple Vitamins-Minerals (PRESERVISION AREDS 2 PO) [id number] Yes Take by mouth. [provider] Taking Active   nitroGLYCERIN (NITROSTAT) 0.4 MG SL tablet [id number] Yes Place 1 tablet (0.4 mg total) under the tongue every 5 (five) minutes as needed for chest pain. [name], NP Taking Active   Omega-3 1000 MG CAPS [id number] Yes Take 1 capsule by mouth daily.  [provider] Taking Active Self   pantoprazole (PROTONIX) 20 MG tablet [id number] Yes Take 1 tablet (20 mg total) by mouth daily. FOR ACID REFLUX AND STOMACH PAIN [name], NP Taking Active   rosuvastatin (CRESTOR) 40 MG tablet [id number] Yes TK 1 T PO QD [provider] Taking Active   tiZANidine (ZANAFLEX) 2 MG tablet [id number] Yes TAKE 1 TABLET(2 MG) BY MOUTH TWICE DAILY [provider] Taking Active            Assessment:   Goals Addressed            This Visit's Progress   . PharmD - Medication Management       Current Barriers:  Marland Kitchen Knowledge Deficits related to diabetes pathophysiology and self-care/management . Lack of blood sugars and blood pressure results for the clinical team  Pharmacist Clinical Goal(s):  Marland Kitchen Over the next 30 days, patient will work with CM Pharmacist and PCP to address needs related to diabetes and medication management  Interventions: . Counsel on importance of blood sugar control and monitoring o Not taking metformin ER.  - Reports tried on 2 separate occasions.  Last trial : took metformin ER 500 mg once daily with food for 2 weeks. Reports stopped as remained unable to tolerate loose bowels and upset stomach side effects o Reports has just started checking morning fasting CBGs two days ago - 9/20: 125 mg/dL - 9/21: 106 mg/dL . Counsel on role of diet and exercise in blood sugar control o Reports that she has been working on having a balanced diet o Reports currently walking on track by her home for ~60 mins x 4 days/week o Reports drinking water regularly to maintain hydration . Review recent blood pressure results with patient o 9/20: 132/63, HR 62 o 9/22: 129/72, HR 80 . Encourage patient to continue checking blood sugar and blood pressure and keeping log . Schedule appointment to complete medication review  Patient Self Care Activities:  . Attends all scheduled provider appointments . Calls pharmacy for medication refills . Calls provider  office for new concerns or questions . To check blood sugar and blood pressure and keep log as directed.  Initial goal documentation        Plan:  Telephone follow up appointment with care management team member scheduled for: 10/5 at 1:30 pm  Harlow Asa, PharmD, [place] [phone]

## 2018-12-20 NOTE — Patient Instructions (Addendum)
Thank you allowing the Chronic Care Management Team to be a part of your care! It was a pleasure speaking with you today!     CCM (Chronic Care Management) Team    Janci Minor RN, BSN Nurse Care Coordinator  [phone]   Harlow Asa PharmD  Clinical Pharmacist  [phone]   Eula Fried LCSW Clinical Social Worker [phone]  Visit Information  Goals Addressed            This Visit's Progress   . PharmD - Medication Management       Current Barriers:  Marland Kitchen Knowledge Deficits related to diabetes pathophysiology and self-care/management . Lack of blood sugars and blood pressure results for the clinical team  Pharmacist Clinical Goal(s):  Marland Kitchen Over the next 30 days, patient will work with CM Pharmacist and PCP to address needs related to diabetes and medication management  Interventions: . Counsel on importance of blood sugar control and monitoring o Not taking metformin ER.  - Reports tried on 2 separate occasions. Last trial : took metformin ER 500 mg once daily with food for 2 weeks. Reports stopped as remained unable to tolerate loose bowels and upset stomach side effects o Reports has just started checking morning fasting CBGs two days ago - 9/20: 125 mg/dL - 9/21: 106 mg/dL . Counsel on role of diet and exercise in blood sugar control o Reports that she has been working on having a balanced diet o Reports currently walking on track by her home for ~60 mins x 4 days/week o Reports drinking water regularly to maintain hydration . Review recent blood pressure results with patient o 9/20: 132/63, HR 62 o 9/22: 129/72, HR 80 . Encourage patient to continue checking blood sugar and blood pressure and keeping log . Schedule appointment to complete medication review  Patient Self Care Activities:  . Attends all scheduled provider appointments . Calls pharmacy for medication refills . Calls provider office for new concerns or questions . To check blood sugar  and blood pressure and keep log as directed.  Initial goal documentation        Ms. Lambeth was given information about Chronic Care Management services today including:  1. CCM service includes personalized support from designated clinical staff supervised by her physician, including individualized plan of care and coordination with other care providers 2. 24/7 contact phone numbers for assistance for urgent and routine care needs. 3. Service will only be billed when office clinical staff spend 20 minutes or more in a month to coordinate care. 4. Only one practitioner may furnish and bill the service in a calendar month. 5. The patient may stop CCM services at any time (effective at the end of the month) by phone call to the office staff. 6. The patient will be responsible for cost sharing (co-pay) of up to 20% of the service fee (after annual deductible is met).  Patient agreed to services and verbal consent obtained.   The patient verbalized understanding of instructions provided today and declined a print copy of patient instruction materials.   Telephone follow up appointment with care management team member scheduled for: 10/5 at 1:30 pm  Harlow Asa, PharmD, [place] [phone]

## 2018-12-23 ENCOUNTER — Other Ambulatory Visit: Payer: Self-pay | Admitting: Nurse Practitioner

## 2018-12-23 DIAGNOSIS — F419 Anxiety disorder, unspecified: Secondary | ICD-10-CM

## 2019-01-02 ENCOUNTER — Telehealth: Payer: Self-pay

## 2019-01-02 ENCOUNTER — Ambulatory Visit: Payer: Self-pay | Admitting: Pharmacist

## 2019-01-02 DIAGNOSIS — E1169 Type 2 diabetes mellitus with other specified complication: Secondary | ICD-10-CM

## 2019-01-02 NOTE — Chronic Care Management (AMB) (Signed)
  Chronic Care Management   Follow Up Note   01/02/2019 Name: Addalynne Henk MRN: [id number] DOB: 10/20/1940  Referred by: [name], NP Reason for referral : Chronic Care Management (Patient Phone Call)   Porter Pliner Wyke is a 78 y.o. year old female who is a primary care patient of [name], NP. The CCM team was consulted for assistance with chronic disease management and care coordination needs.    I reached out to [place] by phone today as scheduled to complete medication review and review blood sugar results.  Was unable to reach patient via telephone today and have left HIPAA compliant voicemail asking patient to return my call.   Plan  The care management team will reach out to the patient again over the next 14 days.   Harlow Asa, PharmD, [place] [employer] [phone]

## 2019-01-05 ENCOUNTER — Telehealth: Payer: Self-pay

## 2019-01-10 ENCOUNTER — Telehealth: Payer: Self-pay

## 2019-01-11 ENCOUNTER — Ambulatory Visit: Payer: Self-pay | Admitting: Pharmacist

## 2019-01-11 DIAGNOSIS — E1169 Type 2 diabetes mellitus with other specified complication: Secondary | ICD-10-CM

## 2019-01-11 NOTE — Patient Instructions (Signed)
Thank you allowing the Chronic Care Management Team to be a part of your care! It was a pleasure speaking with you today!     CCM (Chronic Care Management) Team    Janci Minor RN, BSN Nurse Care Coordinator  [phone]   Harlow Asa PharmD  Clinical Pharmacist  [phone]   Eula Fried LCSW Clinical Social Worker [phone]  Visit Information  Goals Addressed            This Visit's Progress   . PharmD - Medication Management       Current Barriers:  Marland Kitchen Knowledge Deficits related to diabetes pathophysiology and self-care/management . Lack of blood sugars and blood pressure results for the clinical team  Pharmacist Clinical Goal(s):  Marland Kitchen Over the next 30 days, patient will work with CM Pharmacist and PCP to address needs related to diabetes and medication management  Interventions: . Reschedule appointment for medication review. o Ms. Morace reports that she has recently had family members pass away and was not able to make our appointment last week. . Will collaborate with CM Nurse Case Manager as patient asks to not be called this week.  Patient Self Care Activities:  . Attends all scheduled provider appointments . Calls pharmacy for medication refills . Calls provider office for new concerns or questions . To check blood sugar and blood pressure and keep log as directed.  Please see past updates related to this goal by clicking on the "Past Updates" button in the selected goal         The patient verbalized understanding of instructions provided today and declined a print copy of patient instruction materials.   Telephone follow up appointment with care management team member scheduled for: 10/26 at 2:30pm  Harlow Asa, PharmD, [facility] [phone]

## 2019-01-11 NOTE — Chronic Care Management (AMB) (Signed)
Chronic Care Management   Follow Up Note   01/11/2019 Name: Helen Mccormick MRN: [id number] DOB: 1940/09/26  Referred by: Helen College, NP Reason for referral : Chronic Care Management (Patient Phone Call)   Helen Mccormick is a 78 y.o. year old female who is a primary care patient of Helen College, NP. The CCM team was consulted for assistance with chronic disease management and care coordination needs.    I reached out to [place] by phone today.   Review of patient status, including review of consultants reports, relevant laboratory and other test results, and collaboration with appropriate care team members and the patient's provider was performed as part of comprehensive patient evaluation and provision of chronic care management services.     Outpatient Encounter Medications as of 01/11/2019  Medication Sig  . albuterol (PROVENTIL HFA;VENTOLIN HFA) 108 (90 Base) MCG/ACT inhaler Inhale into the lungs every 6 (six) hours as needed for wheezing or shortness of breath.  Marland Kitchen amLODipine (NORVASC) 5 MG tablet TAKE 1 TABLET(5 MG) BY MOUTH DAILY FOR BLOOD PRESSURE  . Ascorbic Acid (VITAMIN C) 1000 MG tablet Take 1,000 mg by mouth daily.  Marland Kitchen aspirin 81 MG chewable tablet Chew 81 mg by mouth daily.   . calcium carbonate (CALCIUM 600) 600 MG TABS tablet Take 600 mg by mouth 2 (two) times daily with a meal.  . carvedilol (COREG) 3.125 MG tablet   . cholecalciferol (VITAMIN D) 1000 units tablet Take 1 tablet by mouth daily.   Marland Kitchen dicyclomine (BENTYL) 10 MG capsule Take 1 capsule three times daily with meals as needed for abdominal pain or cramping with diarrhea.  Marland Kitchen ENTRESTO 24-26 MG 1 tablet 2 (two) times daily.   Marland Kitchen escitalopram (LEXAPRO) 10 MG tablet TAKE 1 TABLET BY MOUTH EVERY DAY FOR DEPRESSION  . Flaxseed, Linseed, (FLAX SEED OIL) 1000 MG CAPS Take by mouth.  . Fluocinolone Acetonide Body 0.01 % OIL Apply 1-2x daily for flares of itching of the scalp  .  fluticasone (FLONASE) 50 MCG/ACT nasal spray Place 2 sprays into both nostrils daily.  Marland Kitchen glucose blood test strip Use 1 strip to check your blood sugar two times daily.  . hydrochlorothiazide (HYDRODIURIL) 12.5 MG tablet Take 1 tablet (12.5 mg total) by mouth daily. FOR BLOOD PRESSURE  . isosorbide mononitrate (IMDUR) 30 MG 24 hr tablet TAKE 1 TABLET(30 MG) BY MOUTH DAILY (Patient taking differently: No sig reported)  . levothyroxine (SYNTHROID, LEVOTHROID) 75 MCG tablet TAKE 1 TABLET BY MOUTH DAILY BEFORE BREAKFAST FOR THYROID  . meloxicam (MOBIC) 15 MG tablet meloxicam 15 mg tablet  Take 1 tablet every day by oral route.  . metFORMIN (GLUCOPHAGE-XR) 500 MG 24 hr tablet Take 1 tablet (500 mg total) by mouth 2 (two) times daily with a meal. (Patient not taking: Reported on 12/15/2018)  . Multiple Vitamins-Minerals (PRESERVISION AREDS 2 PO) Take by mouth.  . nitroGLYCERIN (NITROSTAT) 0.4 MG SL tablet Place 1 tablet (0.4 mg total) under the tongue every 5 (five) minutes as needed for chest pain.  . Omega-3 1000 MG CAPS Take 1 capsule by mouth daily.   . pantoprazole (PROTONIX) 20 MG tablet Take 1 tablet (20 mg total) by mouth daily. FOR ACID REFLUX AND STOMACH PAIN  . rosuvastatin (CRESTOR) 40 MG tablet TK 1 T PO QD  . tiZANidine (ZANAFLEX) 2 MG tablet TAKE 1 TABLET(2 MG) BY MOUTH TWICE DAILY   No facility-administered encounter medications on file as of 01/11/2019.  Goals Addressed            This Visit's Progress   . PharmD - Medication Management       Current Barriers:  Marland Kitchen Knowledge Deficits related to diabetes pathophysiology and self-care/management . Lack of blood sugars and blood pressure results for the clinical team  Pharmacist Clinical Goal(s):  Marland Kitchen Over the next 30 days, patient will work with CM Pharmacist and PCP to address needs related to diabetes and medication management  Interventions: . Reschedule appointment for medication review. o [name] reports that she has  recently had family members pass away and was not able to make our appointment last week. . Will collaborate with CM Nurse Case Manager as patient asks to not be called this week.  Patient Self Care Activities:  . Attends all scheduled provider appointments . Calls pharmacy for medication refills . Calls provider office for new concerns or questions . To check blood sugar and blood pressure and keep log as directed.  Please see past updates related to this goal by clicking on the "Past Updates" button in the selected goal         Plan  Telephone follow up appointment with care management team member scheduled for: 10/26 at 2:30pm  Harlow Asa, PharmD, [place] [phone]

## 2019-01-12 ENCOUNTER — Telehealth: Payer: Self-pay

## 2019-01-16 ENCOUNTER — Telehealth: Payer: Self-pay

## 2019-01-23 ENCOUNTER — Telehealth: Payer: Self-pay

## 2019-01-23 ENCOUNTER — Ambulatory Visit: Payer: Self-pay | Admitting: Pharmacist

## 2019-01-23 NOTE — Chronic Care Management (AMB) (Signed)
  Chronic Care Management   Follow Up Note   01/23/2019 Name: [name] MRN: [id number] DOB: 25-Dec-1940  Referred by: [name], NP Reason for referral : Chronic Care Management (Patient Phone Call)   Helen Mccormick is a 78 y.o. year old female who is a primary care patient of [name], NP. The CCM team was consulted for assistance with chronic disease management and care coordination needs.    I reached out to [place] by phone today as rescheduled to complete medication review and review blood sugar results.  Was unable to reach patient via telephone today and have left HIPAA compliant voicemail asking patient to return my call.   Plan  The care management team will reach out to the patient again over the next 30 days.   Harlow Asa, PharmD, [place] [employer] [phone]

## 2019-01-26 ENCOUNTER — Telehealth: Payer: Self-pay

## 2019-01-27 NOTE — Progress Notes (Signed)
I have reviewed this encounter including the documentation in this note and/or discussed this patient with the provider. I am certifying that I agree with the content of this note as supervising physician.  Nobie Putnam, DO [facility] 01/27/2019, 11:05 PM

## 2019-02-05 IMAGING — CR DG CHEST 2V
2 series · 2 of 2 positions shown · non-contrast
Comparison: 08/06/2017

CLINICAL DATA: Central chest pain with generalized weakness
beginning this morning.

EXAM:
CHEST - 2 VIEW

[chest pa]
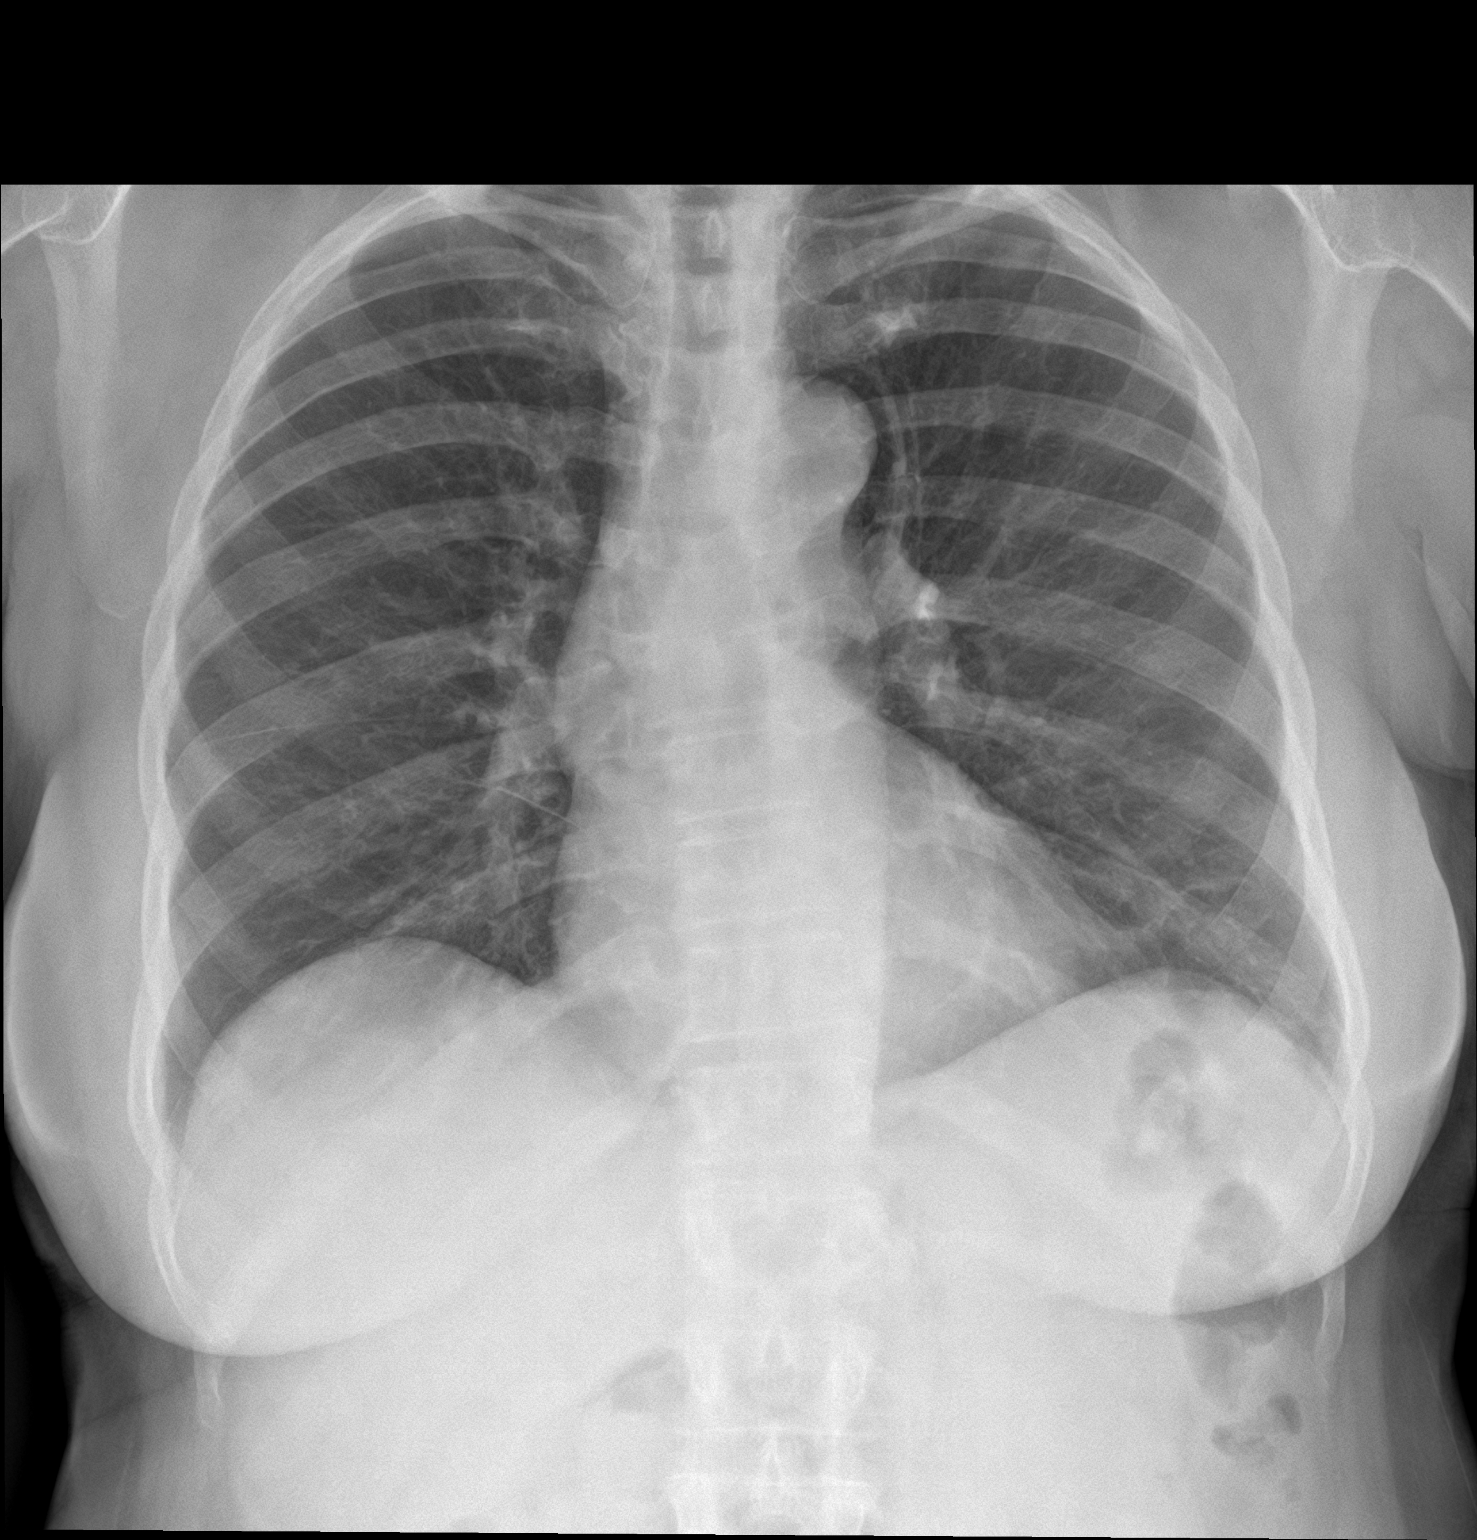

[chest lat]
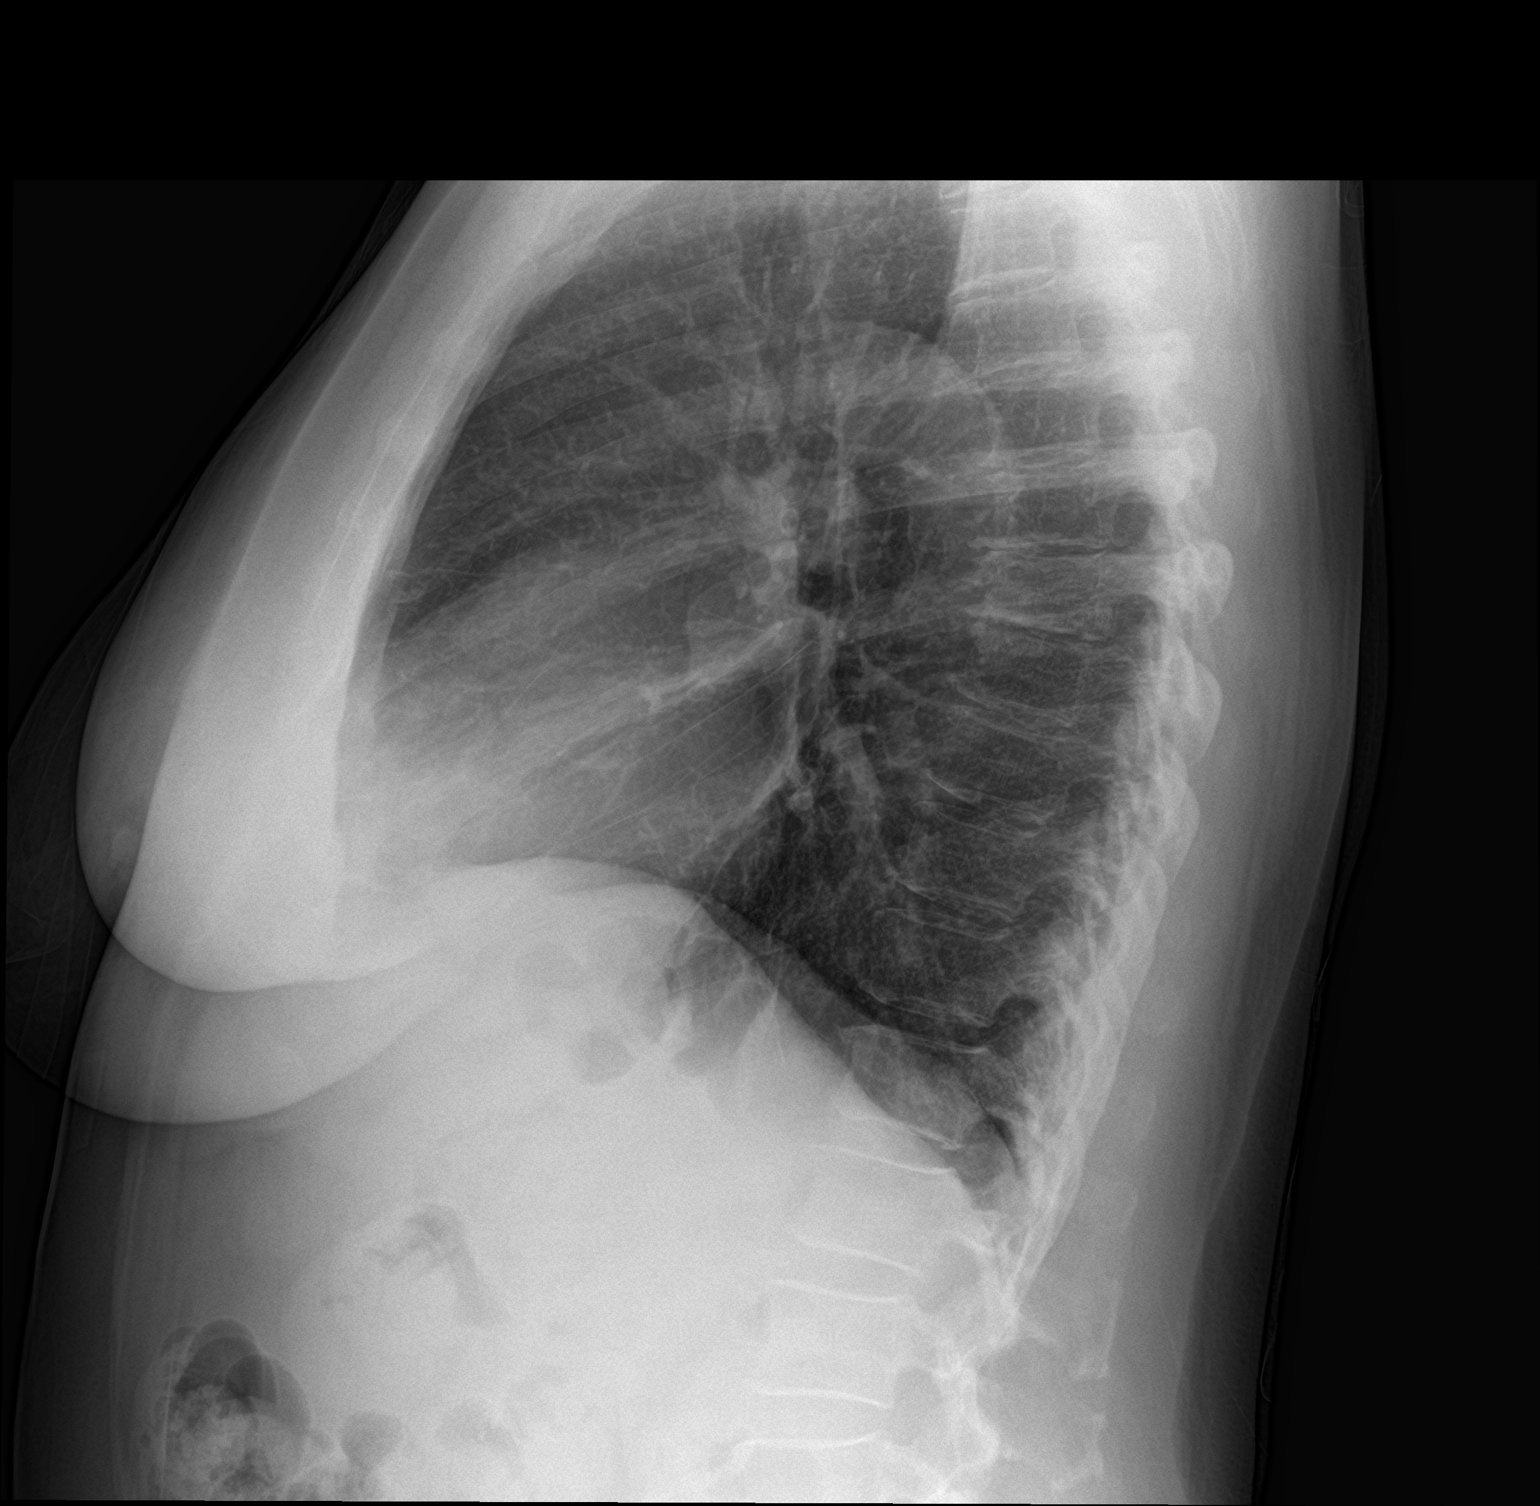

[2 of 2 positions shown; findings below may reference images not displayed]

FINDINGS: The heart size and mediastinal contours are within normal limits.
Both lungs are clear. The visualized skeletal structures are
unremarkable.
IMPRESSION: No active cardiopulmonary disease.

## 2019-02-06 ENCOUNTER — Other Ambulatory Visit: Payer: Self-pay | Admitting: Nurse Practitioner

## 2019-02-06 ENCOUNTER — Telehealth: Payer: Self-pay

## 2019-02-06 DIAGNOSIS — I10 Essential (primary) hypertension: Secondary | ICD-10-CM

## 2019-02-09 ENCOUNTER — Ambulatory Visit: Payer: Medicare Other | Admitting: Pharmacist

## 2019-02-09 DIAGNOSIS — I10 Essential (primary) hypertension: Secondary | ICD-10-CM

## 2019-02-09 DIAGNOSIS — E1169 Type 2 diabetes mellitus with other specified complication: Secondary | ICD-10-CM

## 2019-02-09 NOTE — Patient Instructions (Signed)
Thank you allowing the Chronic Care Management Team to be a part of your care! It was a pleasure speaking with you today!     CCM (Chronic Care Management) Team    Janci Minor RN, BSN Nurse Care Coordinator  [phone]   Harlow Asa PharmD  Clinical Pharmacist  [phone]   Eula Fried LCSW Clinical Social Worker [phone]  Visit Information  Goals Addressed            This Visit's Progress   . PharmD - Medication Management       Current Barriers:  Marland Kitchen Knowledge Deficits related to diabetes pathophysiology and self-care/management . Lack of blood sugars and blood pressure results for the clinical team  Pharmacist Clinical Goal(s):  Marland Kitchen Over the next 30 days, patient will work with CM Pharmacist and PCP to address needs related to diabetes and medication management  Interventions: . Counsel Ms. Snyders on the importance of blood sugar control and monitoring o Patient reports that she has not checked CBG regularly and has currently packed glucometer away - Reports planning to move at the end of the month. o Patient locates glucometer during phone call & reports last reading: - 11/4: 111 (AM fasting) o Encourage patient to start tracking CBGs for now as previously directed by PCP - Note patient not currently on medication for blood sugar control - intolerance to metformin . Counsel on impact of dietary choices on blood sugar . Will mail patient diabetes eduacation booklet, "Living Well with Diabetes", as requested . Schedule appointment to complete medication review and review CBG results . Encourage patient to check blood pressure and keep log o Ms. Arens reports last checked on 10/12, BP 142/68, HR 69   Patient Self Care Activities:  . Attends all scheduled provider appointments . Calls pharmacy for medication refills . Calls provider office for new concerns or questions . To check blood sugar and blood pressure and keep log as directed.  Please see  past updates related to this goal by clicking on the "Past Updates" button in the selected goal         The patient verbalized understanding of instructions provided today and declined a print copy of patient instruction materials.   Telephone follow up appointment with care management team member scheduled for: 11/20 at 2:30pm  Harlow Asa, PharmD, [facility] [phone]

## 2019-02-09 NOTE — Chronic Care Management (AMB) (Signed)
Chronic Care Management   Follow Up Note   02/09/2019 Name: Chaniqua Bandt MRN: [id number] DOB: 07/21/1940  Referred by: [name], NP (Inactive) Reason for referral : Chronic Care Management (Patient Phone Call)   Jazell Allport Covello is a 78 y.o. year old female who is a primary care patient of [name], NP (Inactive). The CCM team was consulted for assistance with chronic disease management and care coordination needs.    I reached out to Rogers by phone today.   Review of patient status, including review of consultants reports, relevant laboratory and other test results, and collaboration with appropriate care team members and the patient's provider was performed as part of comprehensive patient evaluation and provision of chronic care management services.     Outpatient Encounter Medications as of 02/09/2019  Medication Sig  . albuterol (PROVENTIL HFA;VENTOLIN HFA) 108 (90 Base) MCG/ACT inhaler Inhale into the lungs every 6 (six) hours as needed for wheezing or shortness of breath.  Marland Kitchen amLODipine (NORVASC) 5 MG tablet TAKE 1 TABLET(5 MG) BY MOUTH DAILY FOR BLOOD PRESSURE  . Ascorbic Acid (VITAMIN C) 1000 MG tablet Take 1,000 mg by mouth daily.  Marland Kitchen aspirin 81 MG chewable tablet Chew 81 mg by mouth daily.   . calcium carbonate (CALCIUM 600) 600 MG TABS tablet Take 600 mg by mouth 2 (two) times daily with a meal.  . carvedilol (COREG) 3.125 MG tablet   . cholecalciferol (VITAMIN D) 1000 units tablet Take 1 tablet by mouth daily.   Marland Kitchen dicyclomine (BENTYL) 10 MG capsule Take 1 capsule three times daily with meals as needed for abdominal pain or cramping with diarrhea.  Marland Kitchen ENTRESTO 24-26 MG 1 tablet 2 (two) times daily.   Marland Kitchen escitalopram (LEXAPRO) 10 MG tablet TAKE 1 TABLET BY MOUTH EVERY DAY FOR DEPRESSION  . Flaxseed, Linseed, (FLAX SEED OIL) 1000 MG CAPS Take by mouth.  . Fluocinolone Acetonide Body 0.01 % OIL Apply 1-2x daily for flares of itching of  the scalp  . fluticasone (FLONASE) 50 MCG/ACT nasal spray Place 2 sprays into both nostrils daily.  Marland Kitchen glucose blood test strip Use 1 strip to check your blood sugar two times daily.  . hydrochlorothiazide (HYDRODIURIL) 12.5 MG tablet Take 1 tablet (12.5 mg total) by mouth daily. FOR BLOOD PRESSURE  . isosorbide mononitrate (IMDUR) 30 MG 24 hr tablet TAKE 1 TABLET(30 MG) BY MOUTH DAILY (Patient taking differently: No sig reported)  . levothyroxine (SYNTHROID, LEVOTHROID) 75 MCG tablet TAKE 1 TABLET BY MOUTH DAILY BEFORE BREAKFAST FOR THYROID  . meloxicam (MOBIC) 15 MG tablet meloxicam 15 mg tablet  Take 1 tablet every day by oral route.  . metFORMIN (GLUCOPHAGE-XR) 500 MG 24 hr tablet Take 1 tablet (500 mg total) by mouth 2 (two) times daily with a meal. (Patient not taking: Reported on 12/15/2018)  . Multiple Vitamins-Minerals (PRESERVISION AREDS 2 PO) Take by mouth.  . nitroGLYCERIN (NITROSTAT) 0.4 MG SL tablet Place 1 tablet (0.4 mg total) under the tongue every 5 (five) minutes as needed for chest pain.  . Omega-3 1000 MG CAPS Take 1 capsule by mouth daily.   . pantoprazole (PROTONIX) 20 MG tablet Take 1 tablet (20 mg total) by mouth daily. FOR ACID REFLUX AND STOMACH PAIN  . rosuvastatin (CRESTOR) 40 MG tablet TK 1 T PO QD  . tiZANidine (ZANAFLEX) 2 MG tablet TAKE 1 TABLET(2 MG) BY MOUTH TWICE DAILY   No facility-administered encounter medications on file as of 02/09/2019.  Goals Addressed            This Visit's Progress   . PharmD - Medication Management       Current Barriers:  Marland Kitchen Knowledge Deficits related to diabetes pathophysiology and self-care/management . Lack of blood sugars and blood pressure results for the clinical team  Pharmacist Clinical Goal(s):  Marland Kitchen Over the next 30 days, patient will work with CM Pharmacist and PCP to address needs related to diabetes and medication management  Interventions: . Counsel Ms. Tsang on the importance of blood sugar control and  monitoring o Patient reports that she has not checked CBG regularly and has currently packed glucometer away - Reports planning to move at the end of the month. o Patient locates glucometer during phone call & reports last reading: - 11/4: 111 (AM fasting) o Encourage patient to start tracking CBGs for now as previously directed by PCP - Note patient not currently on medication for blood sugar control - intolerance to metformin . Counsel on impact of dietary choices on blood sugar . Will mail patient diabetes eduacation booklet, "Living Well with Diabetes", as requested . Schedule appointment to complete medication review and review CBG results . Encourage patient to check blood pressure and keep log o Ms. Roam reports last checked on 10/12, BP 142/68, HR 69   Patient Self Care Activities:  . Attends all scheduled provider appointments . Calls pharmacy for medication refills . Calls provider office for new concerns or questions . To check blood sugar and blood pressure and keep log as directed.  Please see past updates related to this goal by clicking on the "Past Updates" button in the selected goal         Plan  Telephone follow up appointment with care management team member scheduled for: 11/20 at 2:30pm  Harlow Asa, PharmD, [place] [phone]

## 2019-02-15 ENCOUNTER — Ambulatory Visit: Payer: Medicaid Other | Admitting: Nurse Practitioner

## 2019-02-17 ENCOUNTER — Ambulatory Visit: Payer: Medicare Other | Admitting: Pharmacist

## 2019-02-17 DIAGNOSIS — I10 Essential (primary) hypertension: Secondary | ICD-10-CM

## 2019-02-17 DIAGNOSIS — E1169 Type 2 diabetes mellitus with other specified complication: Secondary | ICD-10-CM

## 2019-02-17 NOTE — Chronic Care Management (AMB) (Signed)
Chronic Care Management   Follow Up Note   02/17/2019 Name: Helen Mccormick MRN: [id number] DOB: Jun 30, 1940  Referred by: Helen College, NP (Inactive) Reason for referral : Chronic Care Management (Patient Phone Call) and Care Coordination (Cardiologist)   Helen Mccormick is a 78 y.o. year old female who is a primary care patient of Helen College, NP (Inactive). The CCM team was consulted for assistance with chronic disease management and care coordination needs.    I reached out to Helen Mccormick by phone today.   Coordination of care call to patient's Cardiologist's office  Review of patient status, including review of consultants reports, relevant laboratory and other test results, and collaboration with appropriate care team members and the patient's provider was performed as part of comprehensive patient evaluation and provision of chronic care management services.     Outpatient Encounter Medications as of 02/17/2019  Medication Sig Note  . albuterol (PROVENTIL HFA;VENTOLIN HFA) 108 (90 Base) MCG/ACT inhaler Inhale into the lungs every 6 (six) hours as needed for wheezing or shortness of breath.   Marland Kitchen amLODipine (NORVASC) 5 MG tablet TAKE 1 TABLET(5 MG) BY MOUTH DAILY FOR BLOOD PRESSURE   . aspirin 81 MG chewable tablet Chew 81 mg by mouth daily.    . calcium carbonate (CALCIUM 600) 600 MG TABS tablet Take 600 mg by mouth 2 (two) times daily with a meal.   . cholecalciferol (VITAMIN D) 1000 units tablet Take 1 tablet by mouth daily.    Marland Kitchen ENTRESTO 24-26 MG 1 tablet 2 (two) times daily.  02/17/2019: Reports taking 1 tablet once daily  . escitalopram (LEXAPRO) 10 MG tablet TAKE 1 TABLET BY MOUTH EVERY DAY FOR DEPRESSION   . Flaxseed, Linseed, (FLAX SEED OIL) 1000 MG CAPS Take by mouth.   . fluticasone (FLONASE) 50 MCG/ACT nasal spray Place 2 sprays into both nostrils daily.   . hydrochlorothiazide (HYDRODIURIL) 12.5 MG tablet Take 1 tablet (12.5 mg total) by  mouth daily. FOR BLOOD PRESSURE   . isosorbide mononitrate (IMDUR) 30 MG 24 hr tablet TAKE 1 TABLET(30 MG) BY MOUTH DAILY   . levothyroxine (SYNTHROID, LEVOTHROID) 75 MCG tablet TAKE 1 TABLET BY MOUTH DAILY BEFORE BREAKFAST FOR THYROID   . meloxicam (MOBIC) 15 MG tablet meloxicam 15 mg tablet  Take 1 tablet every day by oral route.   . nitroGLYCERIN (NITROSTAT) 0.4 MG SL tablet Place 1 tablet (0.4 mg total) under the tongue every 5 (five) minutes as needed for chest pain.   . Omega-3 1000 MG CAPS Take 1 capsule by mouth daily.    . pantoprazole (PROTONIX) 20 MG tablet Take 1 tablet (20 mg total) by mouth daily. FOR ACID REFLUX AND STOMACH PAIN   . carvedilol (COREG) 3.125 MG tablet    . glucose blood test strip Use 1 strip to check your blood sugar two times daily.   . Multiple Vitamins-Minerals (PRESERVISION AREDS 2 PO) Take by mouth.   . rosuvastatin (CRESTOR) 40 MG tablet TK 1 T PO QD   . [DISCONTINUED] Ascorbic Acid (VITAMIN C) 1000 MG tablet Take 1,000 mg by mouth daily.   . [DISCONTINUED] dicyclomine (BENTYL) 10 MG capsule Take 1 capsule three times daily with meals as needed for abdominal pain or cramping with diarrhea. (Patient not taking: Reported on 02/17/2019)   . [DISCONTINUED] Fluocinolone Acetonide Body 0.01 % OIL Apply 1-2x daily for flares of itching of the scalp   . [DISCONTINUED] metFORMIN (GLUCOPHAGE-XR) 500 MG 24 hr tablet Take  1 tablet (500 mg total) by mouth 2 (two) times daily with a meal. (Patient not taking: Reported on 12/15/2018)   . [DISCONTINUED] tiZANidine (ZANAFLEX) 2 MG tablet TAKE 1 TABLET(2 MG) BY MOUTH TWICE DAILY    No facility-administered encounter medications on file as of 02/17/2019.     Goals Addressed            This Visit's Progress   . COMPLETED: PharmD - Medication Management       Current Barriers:  Marland Kitchen Knowledge Deficits related to diabetes pathophysiology and self-care/management . Lack of blood sugars and blood pressure results for the  clinical team  Pharmacist Clinical Goal(s):  Marland Kitchen Over the next 30 days, patient will work with CM Pharmacist and PCP to address needs related to diabetes and medication management  Interventions: .  Counsel on importance of blood sugar control and monitoring o Reports she has been checking morning fasting CBGs since she received log in mail - 11/16 - 146 - 11/18 - 122 - 11/19 - 139 - 11/20 - 132  o Note patient not currently taking any antidiabetic agents (unable to tolerate metformin due to GI side effect) o Encourage patient to continue checking blood sugar and bring log to provider appointments . Comprehensive medication review performed; medication list updated in electronic medical record o [name] reports using a weekly pillbox to organize her medications o Counsel patient on indication for meloxicam and recommend using only as needed and discussing NSAID use with Cardiologist - Patient was confused about indication of medication, thinking this was for stomach acid suppression. States making note of indication on bottle now o Note patient reports that she is currently out of carvedilol and rosuvastatin o Patient currently taking Entresto once daily. Note per dispensing report in chart, prescription also written with once daily direction. . During medication review, patient states that, as previous PCP has left this practice, she is changing primary care providers, establishing care with [facility] as [facility]CP. Initial appointment scheduled for 04/11/2018. Marland Kitchen Encourage patient to follow up with Cardiologist office today o Reports that she has been checking BP and keeping record on log that I mailed. o Encourage patient to provide these BP readings when she calls and review current blood pressure medications with Cardiology office. . Coordination of care call to patient's Cardiologist's (Dr. Neoma Mccormick) office. Speak with Helen Mccormick. Request clinic outreach to patient for  current home BP results and for medication reconciliation . Will collaborate with clinical team regarding patient's report that she is establishing care with new PCP office  Patient Self Care Activities:  . Attends all scheduled provider appointments . Calls pharmacy for medication refills . Calls provider office for new concerns or questions . To check blood sugar and blood pressure and keep log as directed.  Please see past updates related to this goal by clicking on the "Past Updates" button in the selected goal         Plan  No further follow up required: Patient reports establishing care with new PCP office  Harlow Asa, PharmD, [place] [phone]

## 2019-02-17 NOTE — Patient Instructions (Signed)
Thank you allowing the Chronic Care Management Team to be a part of your care! It was a pleasure speaking with you today!     CCM (Chronic Care Management) Team    Janci Minor RN, BSN Nurse Care Coordinator  [phone]   Harlow Asa PharmD  Clinical Pharmacist  [phone]   Eula Fried LCSW Clinical Social Worker [phone]  Visit Information  Goals Addressed            This Visit's Progress   . COMPLETED: PharmD - Medication Management       Current Barriers:  Helen Mccormick Knowledge Deficits related to diabetes pathophysiology and self-care/management . Lack of blood sugars and blood pressure results for the clinical team  Pharmacist Clinical Goal(s):  Helen Mccormick Over the next 30 days, patient will work with CM Pharmacist and PCP to address needs related to diabetes and medication management  Interventions: .  Counsel on importance of blood sugar control and monitoring o Reports she has been checking morning fasting CBGs since she received log in mail - 11/16 - 146 - 11/18 - 122 - 11/19 - 139 - 11/20 - 132  o Note patient not currently taking any antidiabetic agents (unable to tolerate metformin due to GI side effect) o Encourage patient to continue checking blood sugar and bring log to provider appointments . Comprehensive medication review performed; medication list updated in electronic medical record o [name] reports using a weekly pillbox to organize her medications o Counsel patient on indication for meloxicam and recommend using only as needed and discussing NSAID use with Cardiologist - Patient was confused about indication of medication, thinking this was for stomach acid suppression. States making note of indication on bottle now o Note patient reports that she is currently out of carvedilol and rosuvastatin o Patient currently taking Entresto once daily. Note per dispensing report in chart, prescription also written with once daily direction. . During  medication review, patient states that, as previous PCP has left this practice, she is changing primary care providers, establishing care with [facility] as PCP. Initial appointment scheduled for 04/11/2018. Helen Mccormick Encourage patient to follow up with Cardiologist office today o Reports that she has been checking BP and keeping record on log that I mailed. o Encourage patient to provide these BP readings when she calls and review current blood pressure medications with Cardiology office. . Coordination of care call to patient's Cardiologist's (Dr. Neoma Laming) office. Speak with Tonya. Request clinic outreach to patient for current home BP results and for medication reconciliation . Will collaborate with clinical team regarding patient's report that she is establishing care with new PCP office  Patient Self Care Activities:  . Attends all scheduled provider appointments . Calls pharmacy for medication refills . Calls provider office for new concerns or questions . To check blood sugar and blood pressure and keep log as directed.  Please see past updates related to this goal by clicking on the "Past Updates" button in the selected goal         The patient verbalized understanding of instructions provided today and declined a print copy of patient instruction materials.   No further follow up required: Patient reports planning to establish care with new PCP office  Harlow Asa, PharmD, [place] [phone]

## 2019-02-20 ENCOUNTER — Telehealth: Payer: Self-pay

## 2019-03-31 DIAGNOSIS — I428 Other cardiomyopathies: Secondary | ICD-10-CM

## 2019-03-31 HISTORY — DX: Other cardiomyopathies: I42.8

## 2019-04-03 ENCOUNTER — Other Ambulatory Visit: Payer: Self-pay | Admitting: Nurse Practitioner

## 2019-04-03 DIAGNOSIS — I10 Essential (primary) hypertension: Secondary | ICD-10-CM

## 2019-04-17 ENCOUNTER — Other Ambulatory Visit: Payer: Self-pay | Admitting: Nurse Practitioner

## 2019-04-17 DIAGNOSIS — E1169 Type 2 diabetes mellitus with other specified complication: Secondary | ICD-10-CM

## 2019-04-17 DIAGNOSIS — E785 Hyperlipidemia, unspecified: Secondary | ICD-10-CM

## 2019-04-25 ENCOUNTER — Other Ambulatory Visit: Payer: Self-pay | Admitting: Family Medicine

## 2019-04-25 ENCOUNTER — Other Ambulatory Visit: Payer: Self-pay | Admitting: Nurse Practitioner

## 2019-04-25 DIAGNOSIS — F419 Anxiety disorder, unspecified: Secondary | ICD-10-CM

## 2019-04-25 DIAGNOSIS — I10 Essential (primary) hypertension: Secondary | ICD-10-CM

## 2019-05-05 ENCOUNTER — Other Ambulatory Visit: Payer: Self-pay | Admitting: Infectious Diseases

## 2019-05-05 ENCOUNTER — Telehealth: Payer: Self-pay | Admitting: Nurse Practitioner

## 2019-05-05 DIAGNOSIS — Z1231 Encounter for screening mammogram for malignant neoplasm of breast: Secondary | ICD-10-CM

## 2019-05-05 NOTE — Telephone Encounter (Signed)
I called the patient earlier this morning to schedule AWV with Tiffany.  She was driving to an appointment and asked that I call her back.  I called again this afternoon, and she said that she was busy and couldn't talk.  She said that she will call back on Monday.

## 2019-05-06 IMAGING — CR DG CHEST 2V
2 series · 2 of 2 positions shown · non-contrast
Comparison: 08/15/2017

CLINICAL DATA: Chest pain radiating to right shoulder beginning
today. Some shortness-of-breath.

EXAM:
CHEST - 2 VIEW

[chest pa]
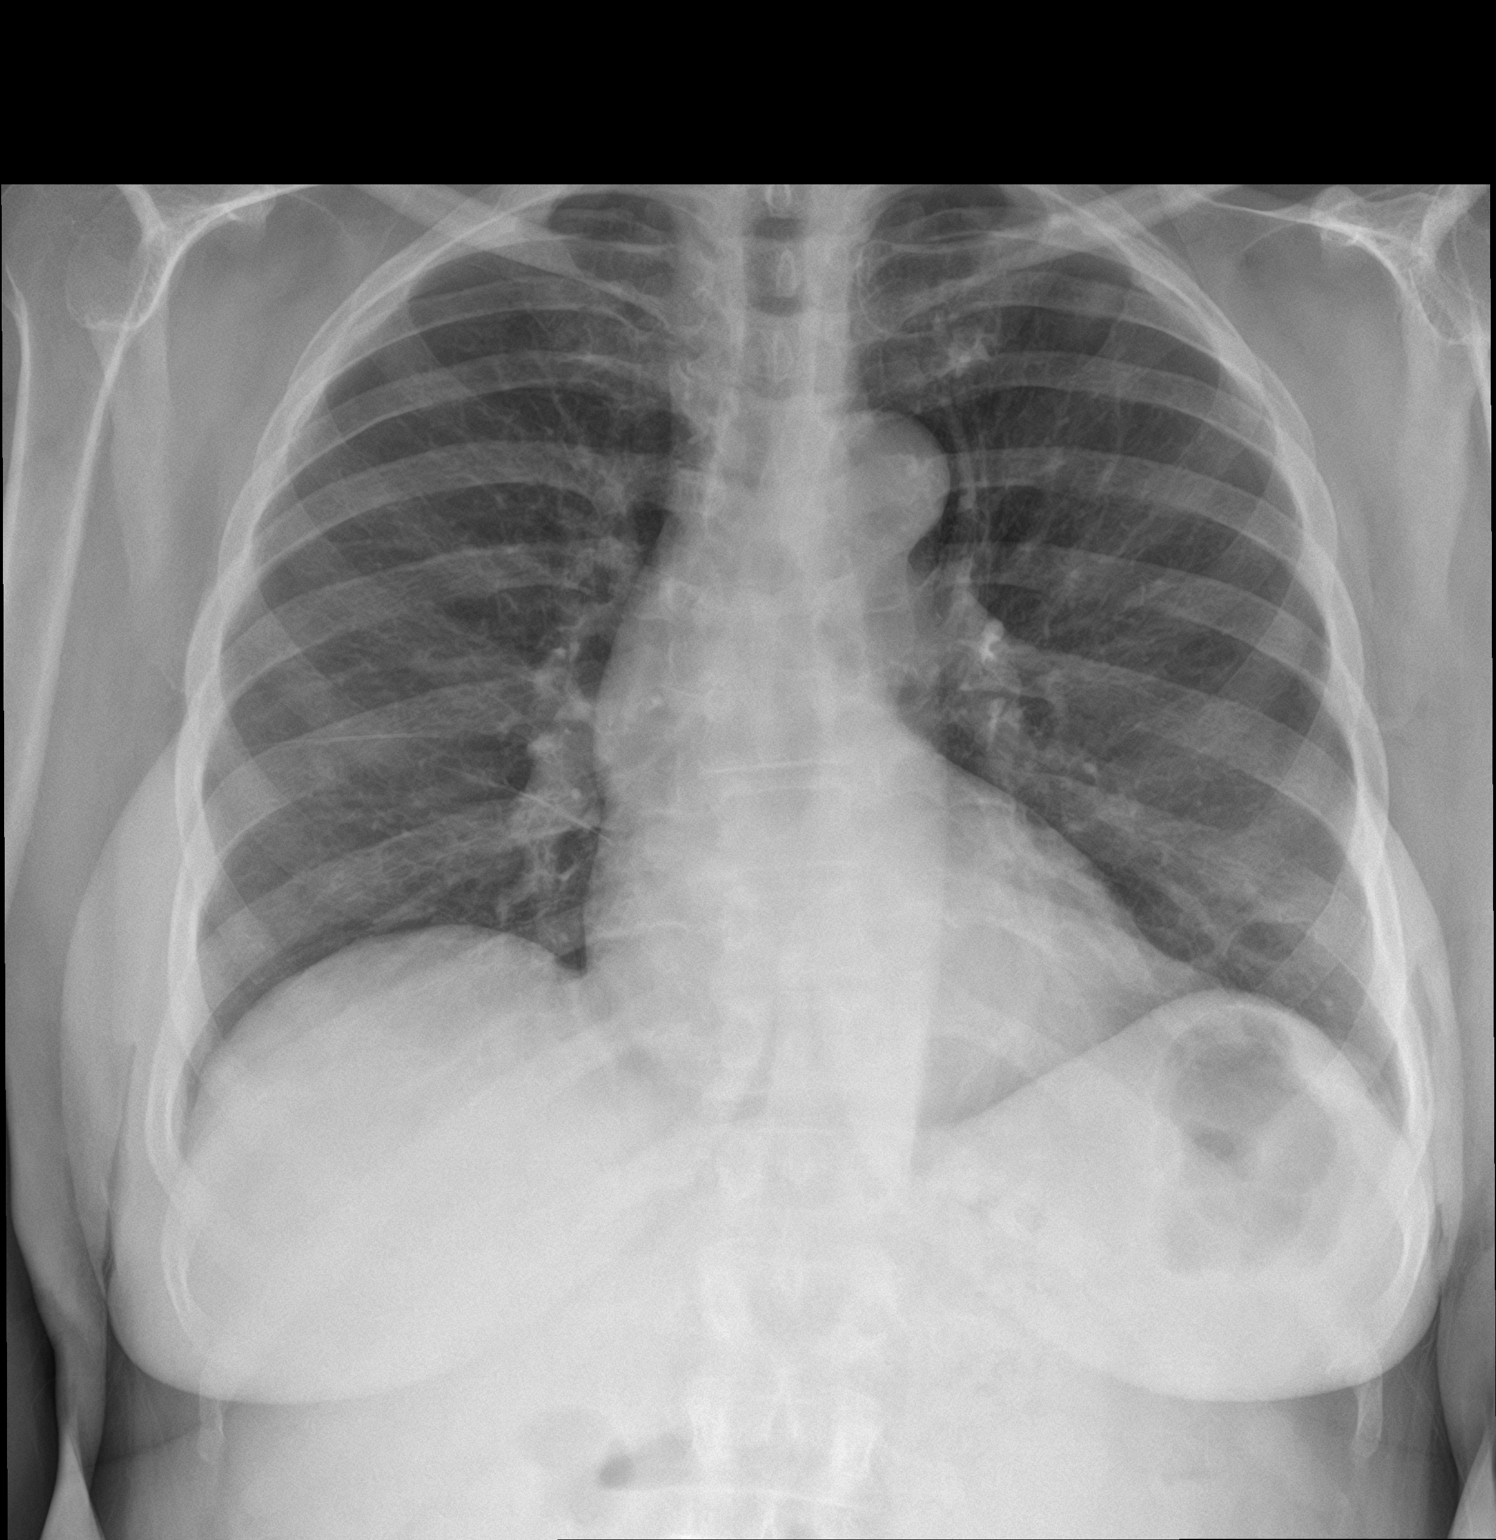

[chest lat]
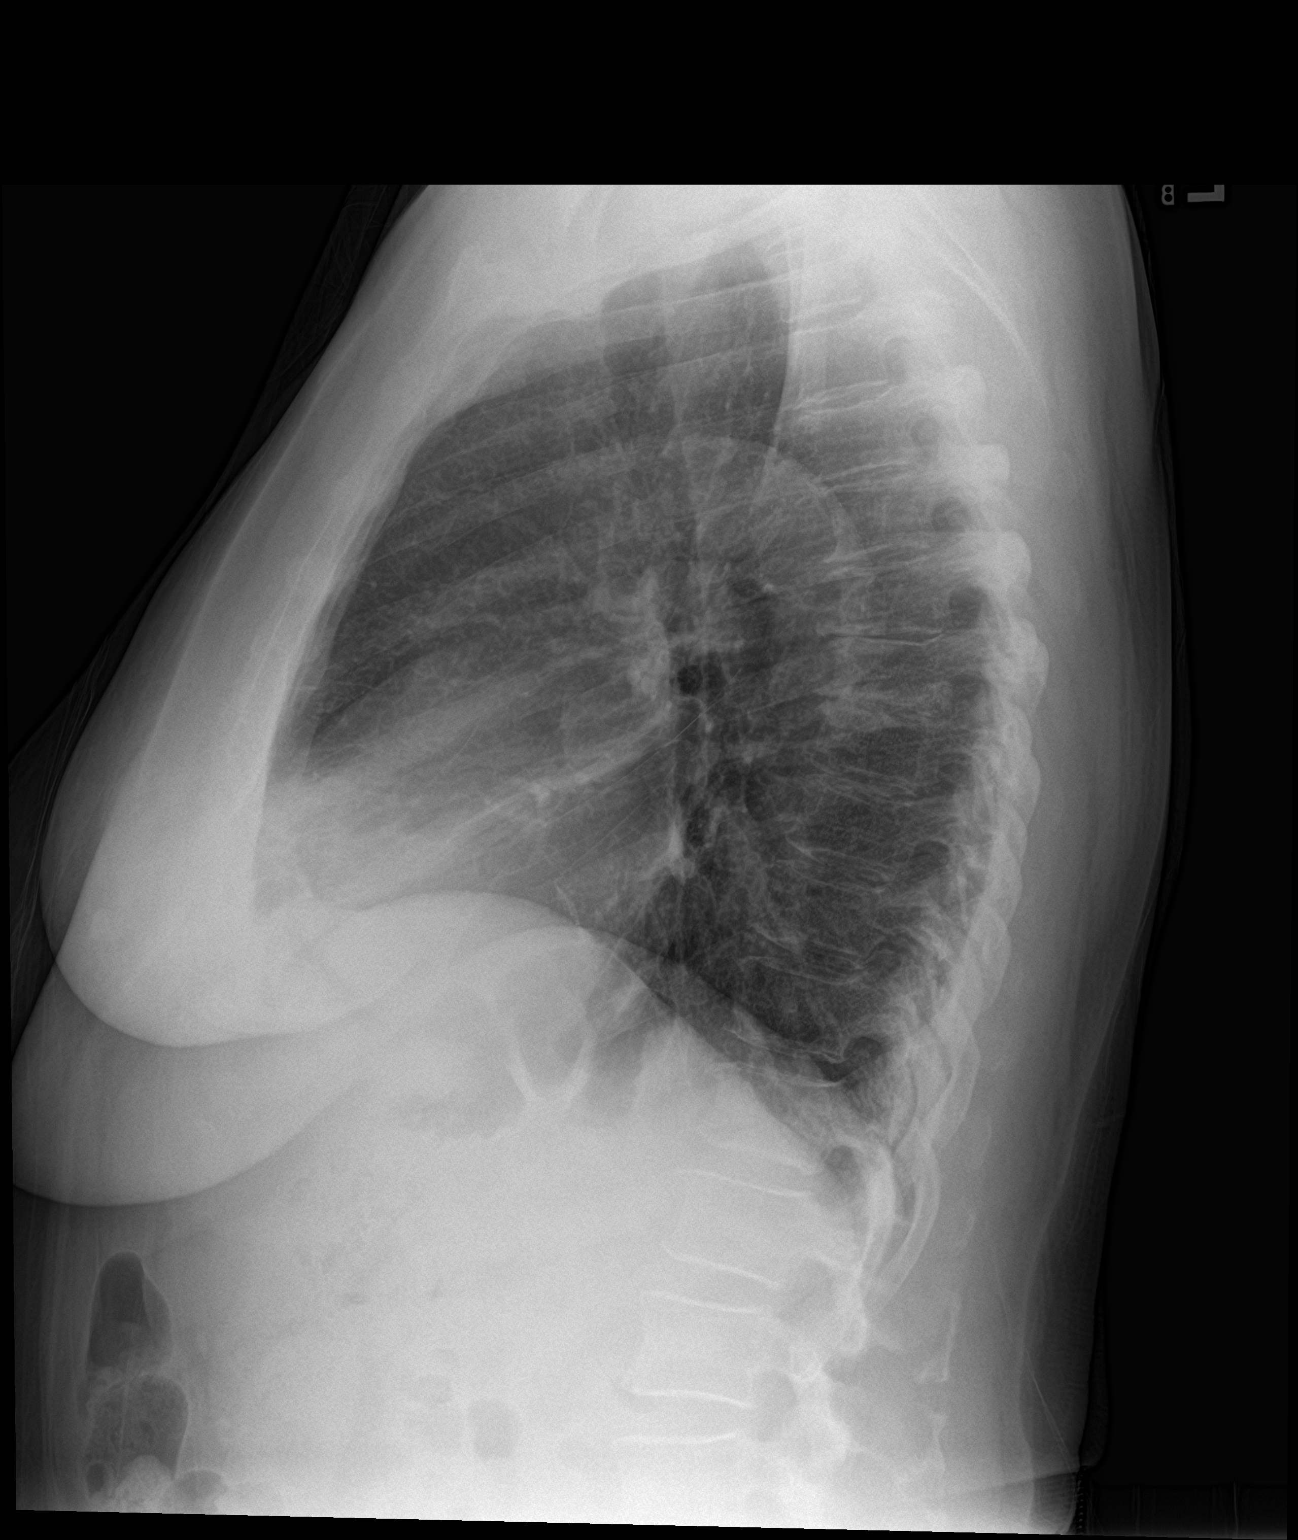

[2 of 2 positions shown; findings below may reference images not displayed]

FINDINGS: Lungs are adequately inflated without focal airspace opacification
or effusion. Cardiomediastinal silhouette and remainder the exam is
unchanged.
IMPRESSION: No active cardiopulmonary disease.

## 2019-05-16 ENCOUNTER — Other Ambulatory Visit
Admission: RE | Admit: 2019-05-16 | Discharge: 2019-05-16 | Disposition: A | Discharge status: Home / Self Care | Payer: Medicare Other | Source: Ambulatory Visit | Attending: Infectious Diseases | Admitting: Infectious Diseases

## 2019-05-16 DIAGNOSIS — I509 Heart failure, unspecified: Secondary | ICD-10-CM | POA: Diagnosis not present

## 2019-05-16 DIAGNOSIS — R1013 Epigastric pain: Secondary | ICD-10-CM | POA: Diagnosis present

## 2019-05-16 DIAGNOSIS — R05 Cough: Secondary | ICD-10-CM | POA: Insufficient documentation

## 2019-05-16 DIAGNOSIS — I429 Cardiomyopathy, unspecified: Secondary | ICD-10-CM | POA: Diagnosis not present

## 2019-05-16 LAB — TROPONIN I (HIGH SENSITIVITY): Troponin I (High Sensitivity): 6 ng/L (ref <18)

## 2019-05-22 ENCOUNTER — Ambulatory Visit: Payer: Medicare Other | Attending: Internal Medicine

## 2019-05-22 DIAGNOSIS — Z23 Encounter for immunization: Secondary | ICD-10-CM | POA: Insufficient documentation

## 2019-05-22 NOTE — Progress Notes (Signed)
   Covid-19 Vaccination Clinic  Name:  Helen Mccormick    MRN: [id number] DOB: 1940/08/02  05/22/2019  [name] was observed post Covid-19 immunization for 15 minutes without incidence. She was provided with Vaccine Information Sheet and instruction to access the V-Safe system.   [name] was instructed to call 911 with any severe reactions post vaccine: Marland Kitchen Difficulty breathing  . Swelling of your face and throat  . A fast heartbeat  . A bad rash all over your body  . Dizziness and weakness    Immunizations Administered    Name Date Dose VIS Date Route   Moderna COVID-19 Vaccine 05/22/2019  1:52 PM 0.5 mL 02/28/2019 Intramuscular   Manufacturer: Moderna   Lot: [id number]   [id number]

## 2019-05-25 ENCOUNTER — Other Ambulatory Visit: Payer: Self-pay

## 2019-05-25 DIAGNOSIS — E890 Postprocedural hypothyroidism: Secondary | ICD-10-CM

## 2019-06-07 ENCOUNTER — Other Ambulatory Visit: Payer: Self-pay

## 2019-06-07 ENCOUNTER — Encounter: Payer: Self-pay | Admitting: Emergency Medicine

## 2019-06-07 ENCOUNTER — Emergency Department
Admission: EM | Admit: 2019-06-07 | Discharge: 2019-06-07 | Disposition: A | Discharge status: Home / Self Care | Payer: Medicare Other | Attending: Emergency Medicine | Admitting: Emergency Medicine

## 2019-06-07 ENCOUNTER — Emergency Department: Payer: Medicare Other

## 2019-06-07 DIAGNOSIS — Z7982 Long term (current) use of aspirin: Secondary | ICD-10-CM | POA: Insufficient documentation

## 2019-06-07 DIAGNOSIS — Z79899 Other long term (current) drug therapy: Secondary | ICD-10-CM | POA: Diagnosis not present

## 2019-06-07 DIAGNOSIS — E039 Hypothyroidism, unspecified: Secondary | ICD-10-CM | POA: Diagnosis not present

## 2019-06-07 DIAGNOSIS — E119 Type 2 diabetes mellitus without complications: Secondary | ICD-10-CM | POA: Diagnosis not present

## 2019-06-07 DIAGNOSIS — R079 Chest pain, unspecified: Secondary | ICD-10-CM | POA: Insufficient documentation

## 2019-06-07 DIAGNOSIS — I10 Essential (primary) hypertension: Secondary | ICD-10-CM | POA: Insufficient documentation

## 2019-06-07 LAB — CBC
HCT: 33.6 % — ABNORMAL LOW (ref 36.0–46.0)
Hemoglobin: 10.7 g/dL — ABNORMAL LOW (ref 12.0–15.0)
MCH: 24.4 pg — ABNORMAL LOW (ref 26.0–34.0)
MCHC: 31.8 g/dL (ref 30.0–36.0)
MCV: 76.7 fL — ABNORMAL LOW (ref 80.0–100.0)
Platelets: 230 10*3/uL (ref 150–400)
RBC: 4.38 MIL/uL (ref 3.87–5.11)
RDW: 15.6 % — ABNORMAL HIGH (ref 11.5–15.5)
WBC: 7 10*3/uL (ref 4.0–10.5)
nRBC: 0 % (ref 0.0–0.2)

## 2019-06-07 LAB — BASIC METABOLIC PANEL
Anion gap: 8 (ref 5–15)
BUN: 29 mg/dL — ABNORMAL HIGH (ref 8–23)
CO2: 30 mmol/L (ref 22–32)
Calcium: 8.9 mg/dL (ref 8.9–10.3)
Chloride: 100 mmol/L (ref 98–111)
Creatinine, Ser: 1.08 mg/dL — ABNORMAL HIGH (ref 0.44–1.00)
GFR calc Af Amer: 58 mL/min — ABNORMAL LOW (ref >60)
GFR calc non Af Amer: 50 mL/min — ABNORMAL LOW (ref >60)
Glucose, Bld: 136 mg/dL — ABNORMAL HIGH (ref 70–99)
Potassium: 3.1 mmol/L — ABNORMAL LOW (ref 3.5–5.1)
Sodium: 138 mmol/L (ref 135–145)

## 2019-06-07 LAB — TROPONIN I (HIGH SENSITIVITY)
Troponin I (High Sensitivity): 8 ng/L (ref <18)
Troponin I (High Sensitivity): 8 ng/L (ref <18)

## 2019-06-07 MED ORDER — POTASSIUM CHLORIDE CRYS ER 20 MEQ PO TBCR
40.0000 meq | EXTENDED_RELEASE_TABLET | Freq: Once | ORAL | Status: AC
  Administered 2019-06-07: 40 meq via ORAL
  Filled 2019-06-07: qty 2

## 2019-06-07 NOTE — Discharge Instructions (Addendum)
Please seek medical attention for any high fevers, chest pain, shortness of breath, change in behavior, persistent vomiting, bloody stool or any other new or concerning symptoms.  

## 2019-06-07 NOTE — ED Provider Notes (Signed)
[facility] Emergency Department Provider Note   ____________________________________________   I have reviewed the triage vital signs and the nursing notes.   HISTORY  Chief Complaint Chest Pain   History limited by: Not Limited   HPI Helen Mccormick is a 79 y.o. female who presents to the emergency department today because of concern for chest pain. She states it is located in the left part of her chest. The patient states that the pain started last night. She states that she did do a lot of moving yesterday. She has been under stress because she is being forced to switch living locations suddenly. The patient denies any significant shortness of breath. Has had some dizziness.    Records reviewed. Per medical record review patient has a history of depression, HLD, HTN.   Past Medical History:  Diagnosis Date  . Allergy   . Anxiety   . Colon polyp   . Depression   . GERD (gastroesophageal reflux disease)   . Heart murmur   . Hyperlipidemia   . Hypertension   . Sleep apnea   . Thyroid disease     Patient Active Problem List   Diagnosis Date Noted  . Obstructive sleep apnea syndrome 12/15/2018  . Microcytic anemia 06/29/2018  . Gastritis 06/21/2018  . Macular degeneration of both eyes 10/13/2017  . Angina at rest [facility]) 08/15/2017  . Fracture of distal end of radius 06/03/2017  . Hypertension 03/24/2017  . DM (diabetes mellitus), type 2 ([place]) 03/24/2017  . 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 Hyperlipidemia due to type 2 diabetes mellitus ([place]) 03/24/2017  . Cervical radiculitis 12/14/2013  . DDD (degenerative disc disease), cervical 12/14/2013  . Left shoulder pain 12/14/2013    Past Surgical History:  Procedure Laterality Date  . ABDOMINAL HYSTERECTOMY    . BACK SURGERY    . BACK SURGERY    . CERVICAL FUSION    . COLONOSCOPY WITH PROPOFOL N/A 05/28/2017   Procedure: COLONOSCOPY WITH PROPOFOL;  Surgeon: Jonathon Bellows, MD;   Location: [facility] ENDOSCOPY;  Service: Gastroenterology;  Laterality: N/A;  . NECK SURGERY    . THYROID SURGERY    . THYROID SURGERY      Prior to Admission medications   Medication Sig Start Date End Date Taking? Authorizing Provider  albuterol (PROVENTIL HFA;VENTOLIN HFA) 108 (90 Base) MCG/ACT inhaler Inhale into the lungs every 6 (six) hours as needed for wheezing or shortness of breath.    [provider]  amLODipine (NORVASC) 5 MG tablet TAKE 1 TABLET(5 MG) BY MOUTH DAILY FOR BLOOD PRESSURE 02/06/19   Parks Ranger, Devonne Doughty, DO  aspirin 81 MG chewable tablet Chew 81 mg by mouth daily.     [provider]  calcium carbonate (CALCIUM 600) 600 MG TABS tablet Take 600 mg by mouth 2 (two) times daily with a meal.    [provider]  carvedilol (COREG) 3.125 MG tablet  08/26/18   [provider]  cholecalciferol (VITAMIN D) 1000 units tablet Take 1 tablet by mouth daily.     [provider]  ENTRESTO 24-26 MG 1 tablet 2 (two) times daily.  08/26/18   [provider]  escitalopram (LEXAPRO) 10 MG tablet TAKE 1 TABLET BY MOUTH EVERY DAY FOR DEPRESSION 04/25/19   Karamalegos, Devonne Doughty, DO  Flaxseed, Linseed, (FLAX SEED OIL) 1000 MG CAPS Take by mouth.    [provider]  fluticasone (FLONASE) 50 MCG/ACT nasal spray Place 2 sprays into both nostrils daily.  [provider]  glucose blood test strip Use 1 strip to check your blood sugar two times daily. 06/21/18   [name], NP  hydrochlorothiazide (HYDRODIURIL) 12.5 MG tablet TAKE 1 TABLET(12.5 MG) BY MOUTH DAILY 04/25/19   Parks Ranger, Devonne Doughty, DO  isosorbide mononitrate (IMDUR) 30 MG 24 hr tablet TAKE 1 TABLET(30 MG) BY MOUTH DAILY 07/24/18   Karamalegos, Devonne Doughty, DO  levothyroxine (SYNTHROID, LEVOTHROID) 75 MCG tablet TAKE 1 TABLET BY MOUTH DAILY BEFORE BREAKFAST FOR THYROID 06/21/18   [name], NP  meloxicam (MOBIC) 15 MG tablet meloxicam 15 mg  tablet  Take 1 tablet every day by oral route.    [provider]  Multiple Vitamins-Minerals (PRESERVISION AREDS 2 PO) Take by mouth.    [provider]  nitroGLYCERIN (NITROSTAT) 0.4 MG SL tablet Place 1 tablet (0.4 mg total) under the tongue every 5 (five) minutes as needed for chest pain. 08/20/17   [name], NP  Omega-3 1000 MG CAPS Take 1 capsule by mouth daily.     [provider]  pantoprazole (PROTONIX) 20 MG tablet Take 1 tablet (20 mg total) by mouth daily. FOR ACID REFLUX AND STOMACH PAIN 06/21/18 06/21/19  [name], NP  rosuvastatin (CRESTOR) 40 MG tablet TK 1 T PO QD 11/05/18   [provider]    Allergies Amlodipine, Tramadol, and Etodolac  Family History  Problem Relation Age of Onset  . Breast cancer Maternal Aunt 60  . Diabetes Mother   . Leukemia Mother   . Diabetes Sister   . Mental illness Sister   . Colon polyps Sister   . Stroke Father   . Cancer Brother        rectal  . Healthy Daughter   . Hypertension Son   . Diabetes Sister   . Thyroid disease Sister   . Healthy Sister   . Healthy Sister   . Stroke Brother   . Healthy Son   . Healthy Son   . Healthy Daughter     Social History Social History   Tobacco Use  . Smoking status: Never Smoker  . Smokeless tobacco: Never Used  Substance Use Topics  . Alcohol use: No  . Drug use: No    Review of Systems Constitutional: No fever/chills Eyes: No visual changes. ENT: No sore throat. Cardiovascular: Positive for chest pain. Respiratory: Denies shortness of breath. Gastrointestinal: No abdominal pain.  No nausea, no vomiting.  No diarrhea.   Genitourinary: Negative for dysuria. Musculoskeletal: Negative for back pain. Skin: Negative for rash. Neurological: Positive for dizziness.  ____________________________________________   PHYSICAL EXAM:  VITAL SIGNS: ED Triage Vitals  Enc Vitals Group     BP 06/07/19 1841 (!) 121/55     Pulse  Rate 06/07/19 1841 70     Resp 06/07/19 1841 16     Temp 06/07/19 1841 98.5 F (36.9 C)     Temp Source 06/07/19 1841 Oral     SpO2 06/07/19 1841 97 %     Weight 06/07/19 1842 174 lb (78.9 kg)     Height 06/07/19 1842 5\' 4"  (1.626 m)     Head Circumference --      Peak Flow --      Pain Score 06/07/19 1842 0   Constitutional: Alert and oriented.  Eyes: Conjunctivae are normal.  ENT      Head: Normocephalic and atraumatic.      Nose: No congestion/rhinnorhea.      Mouth/Throat: Mucous  membranes are moist.      Neck: No stridor. Hematological/Lymphatic/Immunilogical: No cervical lymphadenopathy. Cardiovascular: Normal rate, regular rhythm.  No murmurs, rubs, or gallops.  Respiratory: Normal respiratory effort without tachypnea nor retractions. Breath sounds are clear and equal bilaterally. No wheezes/rales/rhonchi. Gastrointestinal: Soft and non tender. No rebound. No guarding.  Genitourinary: Deferred Musculoskeletal: Normal range of motion in all extremities. No lower extremity edema. Neurologic:  Normal speech and language. No gross focal neurologic deficits are appreciated.  Skin:  Skin is warm, dry and intact. No rash noted. Psychiatric: Mood and affect are normal. Speech and behavior are normal. Patient exhibits appropriate insight and judgment.  ____________________________________________    LABS (pertinent positives/negatives)  Trop hs 8 CBC wbc 7.0, hgb 10.7, plt 230 BMP na 138, k 3.1, glu 136, cr 1.08  ____________________________________________   EKG  I, Nance Pear, attending physician, personally viewed and interpreted this EKG  EKG Time: 1838 Rate: 66 Rhythm: normal sinus rhythm Axis: left axis deviation Intervals: qtc 511 QRS: LBBB ST changes: no st elevation Impression: abnormal ekg ____________________________________________    RADIOLOGY  CXR No active cardiopulmonary  disease  ____________________________________________   PROCEDURES  Procedures  ____________________________________________   INITIAL IMPRESSION / ASSESSMENT AND PLAN / ED COURSE  Pertinent labs & imaging results that were available during my care of the patient were reviewed by me and considered in my medical decision making (see chart for details).   Patient presented to the emergency department today because of concern for chest pain. Troponin negative x 2. CXR without pna or ptx. Doubt PE or dissection given clinical history. I do wonder if it is partly musculoskeletal given that patient did a lot of moving yesterday. Additionally she has been under a lot of stress. She does state she has a cardiology appointment scheduled for next week.   ____________________________________________   FINAL CLINICAL IMPRESSION(S) / ED DIAGNOSES  Final diagnoses:  Nonspecific chest pain     Note: This dictation was prepared with Dragon dictation. Any transcriptional errors that result from this process are unintentional     Nance Pear, MD 06/07/19 2338

## 2019-06-07 NOTE — ED Triage Notes (Signed)
Pt in via POV, complaints of left side chest pain without radiation, reports associated dizziness, flushed sensation.  Ambulatory to triage, NAD noted at this time.

## 2019-06-13 ENCOUNTER — Ambulatory Visit
Admission: RE | Admit: 2019-06-13 | Discharge: 2019-06-13 | Disposition: A | Discharge status: Home / Self Care | Payer: Medicare Other | Source: Ambulatory Visit | Attending: Infectious Diseases | Admitting: Infectious Diseases

## 2019-06-13 DIAGNOSIS — Z1231 Encounter for screening mammogram for malignant neoplasm of breast: Secondary | ICD-10-CM | POA: Insufficient documentation

## 2019-06-20 ENCOUNTER — Ambulatory Visit: Payer: Medicare Other | Attending: Internal Medicine

## 2019-06-20 DIAGNOSIS — Z23 Encounter for immunization: Secondary | ICD-10-CM

## 2019-06-20 NOTE — [place] Notes (Signed)
   Covid-19 Vaccination Clinic  Name:  Helen Mccormick    MRN: [id number] DOB: 05/09/1940  06/20/2019  Ms. Heal was observed post Covid-19 immunization for 15 minutes without incident. She was provided with Vaccine Information Sheet and instruction to access the V-Safe system.   [name] was instructed to call 911 with any severe reactions post vaccine: Marland Kitchen Difficulty breathing  . Swelling of face and throat  . A fast heartbeat  . A bad rash all over body  . Dizziness and weakness   Immunizations Administered    Name Date Dose VIS Date Route   Moderna COVID-19 Vaccine 06/20/2019 10:33 AM 0.5 mL 02/28/2019 Intramuscular   Manufacturer: Levan Hurst   LotUT:[id number]   [id number]

## 2019-06-22 ENCOUNTER — Other Ambulatory Visit: Payer: Self-pay

## 2019-06-22 DIAGNOSIS — E890 Postprocedural hypothyroidism: Secondary | ICD-10-CM

## 2019-06-29 ENCOUNTER — Other Ambulatory Visit: Payer: Self-pay | Admitting: Gastroenterology

## 2019-06-29 DIAGNOSIS — R1310 Dysphagia, unspecified: Secondary | ICD-10-CM

## 2019-06-29 DIAGNOSIS — R6881 Early satiety: Secondary | ICD-10-CM

## 2019-06-29 DIAGNOSIS — R1319 Other dysphagia: Secondary | ICD-10-CM

## 2019-06-29 DIAGNOSIS — R1013 Epigastric pain: Secondary | ICD-10-CM

## 2019-07-17 ENCOUNTER — Encounter: Admission: RE | Admit: 2019-07-17 | Payer: Medicare Other | Source: Ambulatory Visit

## 2019-07-24 ENCOUNTER — Other Ambulatory Visit: Payer: Self-pay | Admitting: Family Medicine

## 2019-07-24 DIAGNOSIS — I10 Essential (primary) hypertension: Secondary | ICD-10-CM

## 2019-07-24 NOTE — Telephone Encounter (Signed)
Requested medication (s) are due for refill today:  Yes  Requested medication (s) are on the active medication list:   Yes  Future visit scheduled:   No.   I called to make her an appt regarding her BP medication however she informed me she is going to another doctor. Dr. Adrian Prows at the [facility] ordered: I did not do anything with this Rx.   I sent this so you could update your records.   Requested Prescriptions  Pending Prescriptions Disp Refills   amLODipine (NORVASC) 5 MG tablet [Pharmacy Med Name: AMLODIPINE BESYLATE 5MG  TABLETS] 90 tablet 1    Sig: TAKE 1 TABLET(5 MG) BY MOUTH DAILY FOR BLOOD PRESSURE      Cardiovascular:  Calcium Channel Blockers Failed - 07/24/2019  7:04 AM      Failed - Valid encounter within last 6 months    Recent Outpatient Visits           7 months ago Need for immunization against influenza   [facility] Merrilyn Puma, Jerrel Ivory, NP   8 months ago Type 2 diabetes mellitus with other specified complication, without long-term current use of insulin [facility])   [facility] Merrilyn Puma, Jerrel Ivory, NP   10 months ago Essential hypertension   [place], DO   1 year ago Close Exposure to [facility] Merrilyn Puma, Jerrel Ivory, NP   1 year ago Intractable drug-induced headache, not elsewhere classified   [facility] [name], NP              Passed - Last BP in normal range    BP Readings from Last 1 Encounters:  06/07/19 125/82

## 2019-08-03 ENCOUNTER — Emergency Department: Payer: Medicare Other

## 2019-08-03 ENCOUNTER — Other Ambulatory Visit: Payer: Self-pay

## 2019-08-03 ENCOUNTER — Encounter: Payer: Self-pay | Admitting: Emergency Medicine

## 2019-08-03 ENCOUNTER — Emergency Department
Admission: EM | Admit: 2019-08-03 | Discharge: 2019-08-03 | Disposition: A | Discharge status: Home / Self Care | Payer: Medicare Other | Attending: Emergency Medicine | Admitting: Emergency Medicine

## 2019-08-03 DIAGNOSIS — E119 Type 2 diabetes mellitus without complications: Secondary | ICD-10-CM | POA: Insufficient documentation

## 2019-08-03 DIAGNOSIS — I10 Essential (primary) hypertension: Secondary | ICD-10-CM | POA: Diagnosis not present

## 2019-08-03 DIAGNOSIS — J189 Pneumonia, unspecified organism: Secondary | ICD-10-CM

## 2019-08-03 DIAGNOSIS — E039 Hypothyroidism, unspecified: Secondary | ICD-10-CM | POA: Diagnosis not present

## 2019-08-03 DIAGNOSIS — Z7982 Long term (current) use of aspirin: Secondary | ICD-10-CM | POA: Insufficient documentation

## 2019-08-03 DIAGNOSIS — Z7984 Long term (current) use of oral hypoglycemic drugs: Secondary | ICD-10-CM | POA: Diagnosis not present

## 2019-08-03 DIAGNOSIS — R0789 Other chest pain: Secondary | ICD-10-CM | POA: Diagnosis present

## 2019-08-03 LAB — CBC
HCT: 35.6 % — ABNORMAL LOW (ref 36.0–46.0)
Hemoglobin: 11.5 g/dL — ABNORMAL LOW (ref 12.0–15.0)
MCH: 24.2 pg — ABNORMAL LOW (ref 26.0–34.0)
MCHC: 32.3 g/dL (ref 30.0–36.0)
MCV: 74.8 fL — ABNORMAL LOW (ref 80.0–100.0)
Platelets: 215 10*3/uL (ref 150–400)
RBC: 4.76 MIL/uL (ref 3.87–5.11)
RDW: 16.1 % — ABNORMAL HIGH (ref 11.5–15.5)
WBC: 6.7 10*3/uL (ref 4.0–10.5)
nRBC: 0 % (ref 0.0–0.2)

## 2019-08-03 LAB — URINALYSIS, COMPLETE (UACMP) WITH MICROSCOPIC
Bacteria, UA: NONE SEEN
Bilirubin Urine: NEGATIVE
Glucose, UA: NEGATIVE mg/dL
Hgb urine dipstick: NEGATIVE
Ketones, ur: NEGATIVE mg/dL
Leukocytes,Ua: NEGATIVE
Nitrite: NEGATIVE
Protein, ur: NEGATIVE mg/dL
Specific Gravity, Urine: 1.023 (ref 1.005–1.030)
pH: 5 (ref 5.0–8.0)

## 2019-08-03 LAB — BASIC METABOLIC PANEL
Anion gap: 9 (ref 5–15)
BUN: 27 mg/dL — ABNORMAL HIGH (ref 8–23)
CO2: 31 mmol/L (ref 22–32)
Calcium: 8.9 mg/dL (ref 8.9–10.3)
Chloride: 101 mmol/L (ref 98–111)
Creatinine, Ser: 0.93 mg/dL (ref 0.44–1.00)
GFR calc Af Amer: >60 mL/min (ref >60)
GFR calc non Af Amer: 60 mL/min — ABNORMAL LOW (ref >60)
Glucose, Bld: 140 mg/dL — ABNORMAL HIGH (ref 70–99)
Potassium: 3.3 mmol/L — ABNORMAL LOW (ref 3.5–5.1)
Sodium: 141 mmol/L (ref 135–145)

## 2019-08-03 LAB — TROPONIN I (HIGH SENSITIVITY): Troponin I (High Sensitivity): 7 ng/L (ref <18)

## 2019-08-03 MED ORDER — AZITHROMYCIN 500 MG PO TABS: 500.0000 mg | ORAL_TABLET | Freq: Every day | ORAL | 0 refills | Status: AC

## 2019-08-03 MED ORDER — CEFDINIR 300 MG PO CAPS: 300.0000 mg | ORAL_CAPSULE | Freq: Two times a day (BID) | ORAL | 0 refills | Status: AC

## 2019-08-03 NOTE — ED Triage Notes (Signed)
Pt reports intermittent sharp pain in her mid chest since last pm and some pain to her left flank. Pt denies SOB, nausea or urinary symptoms.

## 2019-08-03 NOTE — ED Provider Notes (Signed)
[facility] Emergency Department Provider Note   ____________________________________________    I have reviewed the triage vital signs and the nursing notes.   HISTORY  Chief Complaint Chest Pain and Flank Pain     HPI Helen Mccormick is a 79 y.o. female with a history of diabetes and as  noted below who presents with complaints of left sided chest aching which she describes as mild to moderate.  She states this started yesterday.  She reports he has been coughing as well.  Has not checked her temperature but does describe some chills.  Has had both Covid vaccines about a month ago.  No nausea or vomiting.  No diaphoresis.  No radiation of pain.  She does report that she is urinating frequently as well but denies dysuria  Past Medical History:  Diagnosis Date  . Allergy   . Anxiety   . Colon polyp   . Depression   . GERD (gastroesophageal reflux disease)   . Heart murmur   . Hyperlipidemia   . Hypertension   . Sleep apnea   . Thyroid disease     Patient Active Problem List   Diagnosis Date Noted  . Obstructive sleep apnea syndrome 12/15/2018  . Microcytic anemia 06/29/2018  . Gastritis 06/21/2018  . Macular degeneration of both eyes 10/13/2017  . Angina at rest [facility]) 08/15/2017  . Fracture of distal end of radius 06/03/2017  . Hypertension 03/24/2017  . DM (diabetes mellitus), type 2 ([place]) 03/24/2017  . 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 Hyperlipidemia due to type 2 diabetes mellitus ([place]) 03/24/2017  . Cervical radiculitis 12/14/2013  . DDD (degenerative disc disease), cervical 12/14/2013  . Left shoulder pain 12/14/2013    Past Surgical History:  Procedure Laterality Date  . ABDOMINAL HYSTERECTOMY    . BACK SURGERY    . BACK SURGERY    . CERVICAL FUSION    . COLONOSCOPY WITH PROPOFOL N/A 05/28/2017   Procedure: COLONOSCOPY WITH PROPOFOL;  Surgeon: Jonathon Bellows, MD;  Location: [facility] ENDOSCOPY;  Service: Gastroenterology;   Laterality: N/A;  . NECK SURGERY    . THYROID SURGERY    . THYROID SURGERY      Prior to Admission medications   Medication Sig Start Date End Date Taking? Authorizing Provider  albuterol (PROVENTIL HFA;VENTOLIN HFA) 108 (90 Base) MCG/ACT inhaler Inhale into the lungs every 6 (six) hours as needed for wheezing or shortness of breath.    [provider]  amLODipine (NORVASC) 5 MG tablet TAKE 1 TABLET(5 MG) BY MOUTH DAILY FOR BLOOD PRESSURE 02/06/19   Parks Ranger, Devonne Doughty, DO  aspirin 81 MG chewable tablet Chew 81 mg by mouth daily.     [provider]  azithromycin (ZITHROMAX) 500 MG tablet Take 1 tablet (500 mg total) by mouth daily for 7 days. 08/03/19 08/10/19  Lavonia Drafts, MD  calcium carbonate (CALCIUM 600) 600 MG TABS tablet Take 600 mg by mouth 2 (two) times daily with a meal.    [provider]  carvedilol (COREG) 3.125 MG tablet  08/26/18   [provider]  cefdinir (OMNICEF) 300 MG capsule Take 1 capsule (300 mg total) by mouth 2 (two) times daily for 7 days. 08/03/19 08/10/19  Lavonia Drafts, MD  cholecalciferol (VITAMIN D) 1000 units tablet Take 1 tablet by mouth daily.     [provider]  ENTRESTO 24-26 MG 1 tablet 2 (two) times daily.  08/26/18   [provider]  escitalopram (LEXAPRO) 10 MG  tablet TAKE 1 TABLET BY MOUTH EVERY DAY FOR DEPRESSION 04/25/19   Karamalegos, Devonne Doughty, DO  Flaxseed, Linseed, (FLAX SEED OIL) 1000 MG CAPS Take by mouth.    [provider]  fluticasone (FLONASE) 50 MCG/ACT nasal spray Place 2 sprays into both nostrils daily.    [provider]  glucose blood test strip Use 1 strip to check your blood sugar two times daily. 06/21/18   [name], NP  hydrochlorothiazide (HYDRODIURIL) 12.5 MG tablet TAKE 1 TABLET(12.5 MG) BY MOUTH DAILY 04/25/19   Parks Ranger, Devonne Doughty, DO  isosorbide mononitrate (IMDUR) 30 MG 24 hr tablet TAKE 1 TABLET(30 MG) BY MOUTH DAILY 07/24/18    Karamalegos, Devonne Doughty, DO  levothyroxine (SYNTHROID, LEVOTHROID) 75 MCG tablet TAKE 1 TABLET BY MOUTH DAILY BEFORE BREAKFAST FOR THYROID 06/21/18   [name], NP  meloxicam (MOBIC) 15 MG tablet meloxicam 15 mg tablet  Take 1 tablet every day by oral route.    [provider]  Multiple Vitamins-Minerals (PRESERVISION AREDS 2 PO) Take by mouth.    [provider]  nitroGLYCERIN (NITROSTAT) 0.4 MG SL tablet Place 1 tablet (0.4 mg total) under the tongue every 5 (five) minutes as needed for chest pain. 08/20/17   [name], NP  Omega-3 1000 MG CAPS Take 1 capsule by mouth daily.     [provider]  pantoprazole (PROTONIX) 20 MG tablet Take 1 tablet (20 mg total) by mouth daily. FOR ACID REFLUX AND STOMACH PAIN 06/21/18 06/21/19  [name], NP  rosuvastatin (CRESTOR) 40 MG tablet TK 1 T PO QD 11/05/18   [provider]     Allergies Amlodipine, Tramadol, and Etodolac  Family History  Problem Relation Age of Onset  . Breast cancer Maternal Aunt 60  . Diabetes Mother   . Leukemia Mother   . Diabetes Sister   . Mental illness Sister   . Colon polyps Sister   . Stroke Father   . Cancer Brother        rectal  . Healthy Daughter   . Hypertension Son   . Diabetes Sister   . Thyroid disease Sister   . Healthy Sister   . Healthy Sister   . Stroke Brother   . Healthy Son   . Healthy Son   . Healthy Daughter     Social History Social History   Tobacco Use  . Smoking status: Never Smoker  . Smokeless tobacco: Never Used  Substance Use Topics  . Alcohol use: No  . Drug use: No    Review of Systems  Constitutional: No fever/chills Eyes: No visual changes.  ENT: No sore throat. Cardiovascular: As above Respiratory: Denies shortness of breath.  Positive cough Gastrointestinal: No abdominal pain.  No nausea, no vomiting.   Genitourinary: As above Musculoskeletal: Negative for back pain. Skin: Negative for rash.  Neurological: Negative for headaches    ____________________________________________   PHYSICAL EXAM:  VITAL SIGNS: ED Triage Vitals [08/03/19 0732]  Enc Vitals Group     BP 139/62     Pulse Rate 70     Resp 20     Temp 97.8 F (36.6 C)     Temp Source Oral     SpO2 97 %     Weight 79.4 kg (175 lb)     Height 1.626 m (5\' 4" )     Head Circumference      Peak Flow      Pain Score 8  Pain Loc      Pain Edu?      Excl. in [place]?     Constitutional: Alert and oriented.   Nose: No congestion/rhinnorhea. Mouth/Throat: Mucous membranes are moist.    Cardiovascular: Normal rate, regular rhythm. Grossly normal heart sounds.  Good peripheral circulation.  No rash or chest wall tenderness Respiratory: Normal respiratory effort.  No retractions. Lungs CTAB. Gastrointestinal: Soft and nontender. No distention.  No CVA tenderness.  Reassuring exam  Musculoskeletal: No lower extremity tenderness nor edema.  Warm and well perfused Neurologic:  Normal speech and language. No gross focal neurologic deficits are appreciated.  Skin:  Skin is warm, dry and intact. No rash noted. Psychiatric: Mood and affect are normal. Speech and behavior are normal.  ____________________________________________   LABS (all labs ordered are listed, but only abnormal results are displayed)  Labs Reviewed  BASIC METABOLIC PANEL - Abnormal; Notable for the following components:      Result Value   Potassium 3.3 (*)    Glucose, Bld 140 (*)    BUN 27 (*)    GFR calc non Af Amer 60 (*)    All other components within normal limits  CBC - Abnormal; Notable for the following components:   Hemoglobin 11.5 (*)    HCT 35.6 (*)    MCV 74.8 (*)    MCH 24.2 (*)    RDW 16.1 (*)    All other components within normal limits  URINALYSIS, COMPLETE (UACMP) WITH MICROSCOPIC - Abnormal; Notable for the following components:   Color, Urine YELLOW (*)    APPearance CLEAR (*)    All other components within normal  limits  TROPONIN I (HIGH SENSITIVITY)  TROPONIN I (HIGH SENSITIVITY)   ____________________________________________  EKG  ED ECG REPORT I, Lavonia Drafts, the attending physician, personally viewed and interpreted this ECG.  Date: 08/03/2019  Rhythm: normal sinus rhythm QRS Axis: Left axis Intervals: Left bundle branch block ST/T Wave abnormalities: normal Narrative Interpretation: no evidence of acute ischemia  ____________________________________________  RADIOLOGY  Chest x-ray reviewed by me, opacity in the left lingula ____________________________________________   PROCEDURES  Procedure(s) performed: No  Procedures   Critical Care performed: No ____________________________________________   INITIAL IMPRESSION / ASSESSMENT AND PLAN / ED COURSE  Pertinent labs & imaging results that were available during my care of the patient were reviewed by me and considered in my medical decision making (see chart for details).  Patient presents with left-sided chest discomfort as described above.  Differential includes pneumonia, bronchitis, chest wall pain, shingles, less likely ACS or PE.  Patient does not have any CVA tenderness but given her description of frequency urine obtained which is reassuring.  Chemistries are overall unremarkable, mild hypokalemia, white blood cell count is normal.  Troponin is unremarkable.  EKG demonstrates unchanged left bundle branch block.  Chest x-ray demonstrates likely lingular opacity/pneumonia, this is likely the cause of her pain.  Will start on antibiotics, will require follow-up to demonstrate resolution    ____________________________________________   FINAL CLINICAL IMPRESSION(S) / ED DIAGNOSES  Final diagnoses:  Community acquired pneumonia of left lower lobe of lung        Note:  This document was prepared using Dragon voice recognition software and may include unintentional dictation errors.   Lavonia Drafts, MD  08/03/19 [phone]

## 2019-09-16 ENCOUNTER — Emergency Department
Admission: EM | Admit: 2019-09-16 | Discharge: 2019-09-16 | Disposition: A | Discharge status: Home / Self Care | Payer: Medicare Other | Attending: Emergency Medicine | Admitting: Emergency Medicine

## 2019-09-16 ENCOUNTER — Encounter: Payer: Self-pay | Admitting: Emergency Medicine

## 2019-09-16 ENCOUNTER — Emergency Department: Payer: Medicare Other

## 2019-09-16 ENCOUNTER — Other Ambulatory Visit: Payer: Self-pay

## 2019-09-16 DIAGNOSIS — Z79899 Other long term (current) drug therapy: Secondary | ICD-10-CM | POA: Insufficient documentation

## 2019-09-16 DIAGNOSIS — R0789 Other chest pain: Secondary | ICD-10-CM | POA: Insufficient documentation

## 2019-09-16 DIAGNOSIS — E039 Hypothyroidism, unspecified: Secondary | ICD-10-CM | POA: Insufficient documentation

## 2019-09-16 DIAGNOSIS — Z8679 Personal history of other diseases of the circulatory system: Secondary | ICD-10-CM | POA: Insufficient documentation

## 2019-09-16 DIAGNOSIS — I10 Essential (primary) hypertension: Secondary | ICD-10-CM | POA: Insufficient documentation

## 2019-09-16 DIAGNOSIS — E119 Type 2 diabetes mellitus without complications: Secondary | ICD-10-CM | POA: Insufficient documentation

## 2019-09-16 DIAGNOSIS — Z7982 Long term (current) use of aspirin: Secondary | ICD-10-CM | POA: Diagnosis not present

## 2019-09-16 LAB — CBC
HCT: 35.2 % — ABNORMAL LOW (ref 36.0–46.0)
Hemoglobin: 11.3 g/dL — ABNORMAL LOW (ref 12.0–15.0)
MCH: 24.4 pg — ABNORMAL LOW (ref 26.0–34.0)
MCHC: 32.1 g/dL (ref 30.0–36.0)
MCV: 75.9 fL — ABNORMAL LOW (ref 80.0–100.0)
Platelets: 210 10*3/uL (ref 150–400)
RBC: 4.64 MIL/uL (ref 3.87–5.11)
RDW: 16.6 % — ABNORMAL HIGH (ref 11.5–15.5)
WBC: 9 10*3/uL (ref 4.0–10.5)
nRBC: 0 % (ref 0.0–0.2)

## 2019-09-16 LAB — BASIC METABOLIC PANEL
Anion gap: 11 (ref 5–15)
BUN: 31 mg/dL — ABNORMAL HIGH (ref 8–23)
CO2: 30 mmol/L (ref 22–32)
Calcium: 8.7 mg/dL — ABNORMAL LOW (ref 8.9–10.3)
Chloride: 98 mmol/L (ref 98–111)
Creatinine, Ser: 0.92 mg/dL (ref 0.44–1.00)
GFR calc Af Amer: >60 mL/min (ref >60)
GFR calc non Af Amer: >60 mL/min (ref >60)
Glucose, Bld: 146 mg/dL — ABNORMAL HIGH (ref 70–99)
Potassium: 3.1 mmol/L — ABNORMAL LOW (ref 3.5–5.1)
Sodium: 139 mmol/L (ref 135–145)

## 2019-09-16 LAB — TROPONIN I (HIGH SENSITIVITY)
Troponin I (High Sensitivity): 9 ng/L (ref <18)
Troponin I (High Sensitivity): 7 ng/L (ref <18)

## 2019-09-16 MED ORDER — MORPHINE SULFATE (PF) 4 MG/ML IV SOLN
4.0000 mg | Freq: Once | INTRAVENOUS | Status: AC
  Administered 2019-09-16: 4 mg via INTRAVENOUS
  Filled 2019-09-16: qty 1

## 2019-09-16 MED ORDER — IOHEXOL 350 MG/ML SOLN
100.0000 mL | Freq: Once | INTRAVENOUS | Status: AC | PRN
  Administered 2019-09-16: 100 mL via INTRAVENOUS

## 2019-09-16 MED ORDER — ONDANSETRON HCL 4 MG/2ML IJ SOLN
4.0000 mg | Freq: Once | INTRAMUSCULAR | Status: AC
  Administered 2019-09-16: 4 mg via INTRAVENOUS
  Filled 2019-09-16: qty 2

## 2019-09-16 NOTE — ED Triage Notes (Signed)
Pt presents to ED via POV with c/o substernal CP that is 10/10 that is sharp/shooting in nature x 1 week. Pt states seen at PCP and given abx and percocet without relief. 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 Pt noted to be A&O x4 but tearful on arrival to ED.

## 2019-09-16 NOTE — ED Provider Notes (Signed)
[facility] Emergency Department Provider Note   ____________________________________________    I have reviewed the triage vital signs and the nursing notes.   HISTORY  Chief Complaint Chest Pain     HPI Helen Mccormick is a 79 y.o. female who presents with complaints of chest pain.  Patient describes 1 week of chest pain with radiation from her back to her central chest.  She is concerned because she reports a history of a thoracic aortic aneurysm which was discovered approximately 2 years ago.  She has not had imaging since then.  She denies shortness of breath.  No cough.  No pleurisy.  No nausea or vomiting.  No diaphoresis.  No calf pain or swelling.  Does not take anything for this.  Nothing seems to make it better or worse  Past Medical History:  Diagnosis Date  . Allergy   . Anxiety   . Colon polyp   . Depression   . GERD (gastroesophageal reflux disease)   . Heart murmur   . Hyperlipidemia   . Hypertension   . Sleep apnea   . Thyroid disease     Patient Active Problem List   Diagnosis Date Noted  . Obstructive sleep apnea syndrome 12/15/2018  . Microcytic anemia 06/29/2018  . Gastritis 06/21/2018  . Macular degeneration of both eyes 10/13/2017  . Angina at rest [facility]) 08/15/2017  . Fracture of distal end of radius 06/03/2017  . Hypertension 03/24/2017  . DM (diabetes mellitus), type 2 ([place]) 03/24/2017  . 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 Hyperlipidemia due to type 2 diabetes mellitus ([place]) 03/24/2017  . Cervical radiculitis 12/14/2013  . DDD (degenerative disc disease), cervical 12/14/2013  . Left shoulder pain 12/14/2013    Past Surgical History:  Procedure Laterality Date  . ABDOMINAL HYSTERECTOMY    . BACK SURGERY    . BACK SURGERY    . CERVICAL FUSION    . COLONOSCOPY WITH PROPOFOL N/A 05/28/2017   Procedure: COLONOSCOPY WITH PROPOFOL;  Surgeon: Jonathon Bellows, MD;  Location: [facility] ENDOSCOPY;  Service:  Gastroenterology;  Laterality: N/A;  . NECK SURGERY    . THYROID SURGERY    . THYROID SURGERY      Prior to Admission medications   Medication Sig Start Date End Date Taking? Authorizing Provider  albuterol (PROVENTIL HFA;VENTOLIN HFA) 108 (90 Base) MCG/ACT inhaler Inhale into the lungs every 6 (six) hours as needed for wheezing or shortness of breath.    [provider]  amLODipine (NORVASC) 5 MG tablet TAKE 1 TABLET(5 MG) BY MOUTH DAILY FOR BLOOD PRESSURE 02/06/19   Parks Ranger, Devonne Doughty, DO  aspirin 81 MG chewable tablet Chew 81 mg by mouth daily.     [provider]  calcium carbonate (CALCIUM 600) 600 MG TABS tablet Take 600 mg by mouth 2 (two) times daily with a meal.    [provider]  carvedilol (COREG) 3.125 MG tablet  08/26/18   [provider]  cholecalciferol (VITAMIN D) 1000 units tablet Take 1 tablet by mouth daily.     [provider]  ENTRESTO 24-26 MG 1 tablet 2 (two) times daily.  08/26/18   [provider]  escitalopram (LEXAPRO) 10 MG tablet TAKE 1 TABLET BY MOUTH EVERY DAY FOR DEPRESSION 04/25/19   Karamalegos, Devonne Doughty, DO  Flaxseed, Linseed, (FLAX SEED OIL) 1000 MG CAPS Take by mouth.    [provider]  fluticasone (FLONASE) 50 MCG/ACT nasal spray Place 2 sprays into both nostrils  daily.    [provider]  glucose blood test strip Use 1 strip to check your blood sugar two times daily. 06/21/18   [name], NP  hydrochlorothiazide (HYDRODIURIL) 12.5 MG tablet TAKE 1 TABLET(12.5 MG) BY MOUTH DAILY 04/25/19   Parks Ranger, Devonne Doughty, DO  isosorbide mononitrate (IMDUR) 30 MG 24 hr tablet TAKE 1 TABLET(30 MG) BY MOUTH DAILY 07/24/18   Karamalegos, Devonne Doughty, DO  levothyroxine (SYNTHROID, LEVOTHROID) 75 MCG tablet TAKE 1 TABLET BY MOUTH DAILY BEFORE BREAKFAST FOR THYROID 06/21/18   [name], NP  meloxicam (MOBIC) 15 MG tablet meloxicam 15 mg tablet  Take 1 tablet every day by  oral route.    [provider]  Multiple Vitamins-Minerals (PRESERVISION AREDS 2 PO) Take by mouth.    [provider]  nitroGLYCERIN (NITROSTAT) 0.4 MG SL tablet Place 1 tablet (0.4 mg total) under the tongue every 5 (five) minutes as needed for chest pain. 08/20/17   [name], NP  Omega-3 1000 MG CAPS Take 1 capsule by mouth daily.     [provider]  pantoprazole (PROTONIX) 20 MG tablet Take 1 tablet (20 mg total) by mouth daily. FOR ACID REFLUX AND STOMACH PAIN 06/21/18 06/21/19  [name], NP  rosuvastatin (CRESTOR) 40 MG tablet TK 1 T PO QD 11/05/18   [provider]     Allergies Amlodipine, Tramadol, and Etodolac  Family History  Problem Relation Age of Onset  . Breast cancer Maternal Aunt 60  . Diabetes Mother   . Leukemia Mother   . Diabetes Sister   . Mental illness Sister   . Colon polyps Sister   . Stroke Father   . Cancer Brother        rectal  . Healthy Daughter   . Hypertension Son   . Diabetes Sister   . Thyroid disease Sister   . Healthy Sister   . Healthy Sister   . Stroke Brother   . Healthy Son   . Healthy Son   . Healthy Daughter     Social History Social History   Tobacco Use  . Smoking status: Never Smoker  . Smokeless tobacco: Never Used  Vaping Use  . Vaping Use: Never used  Substance Use Topics  . Alcohol use: No  . Drug use: No    Review of Systems  Constitutional: No fever/chills Eyes: No visual changes.  ENT: No sore throat. Cardiovascular: As above Respiratory: Denies shortness of breath. Gastrointestinal: No abdominal pain.  No nausea, no vomiting.   Genitourinary: Negative for dysuria. Musculoskeletal: Negative for back pain. Skin: Negative for rash. Neurological: Negative for headaches or weakness   ____________________________________________   PHYSICAL EXAM:  VITAL SIGNS: ED Triage Vitals  Enc Vitals Group     BP 09/16/19 0943 (!) 144/63     Pulse Rate  09/16/19 0943 66     Resp 09/16/19 0943 20     Temp 09/16/19 0943 98.2 F (36.8 C)     Temp Source 09/16/19 0943 Oral     SpO2 09/16/19 0943 96 %     Weight 09/16/19 0941 78 kg (172 lb)     Height 09/16/19 0941 1.626 m (5\' 4" )     Head Circumference --      Peak Flow --      Pain Score 09/16/19 0941 10     Pain Loc --      Pain Edu? --      Excl. in [place]? --  Constitutional: Alert and oriented.   Nose: No congestion/rhinnorhea. Mouth/Throat: Mucous membranes are moist.   Neck:  Painless ROM Cardiovascular: Normal rate, regular rhythm. Grossly normal heart sounds.  Good peripheral circulation.  Equal pulses bilaterally Respiratory: Normal respiratory effort.  No retractions. Lungs CTAB. Gastrointestinal: Soft and nontender. No distention.    Musculoskeletal: No lower extremity tenderness nor edema.  Warm and well perfused Neurologic:  Normal speech and language. No gross focal neurologic deficits are appreciated.  Skin:  Skin is warm, dry and intact. No rash noted. Psychiatric: Mood and affect are normal. Speech and behavior are normal.  ____________________________________________   LABS (all labs ordered are listed, but only abnormal results are displayed)  Labs Reviewed  BASIC METABOLIC PANEL - Abnormal; Notable for the following components:      Result Value   Potassium 3.1 (*)    Glucose, Bld 146 (*)    BUN 31 (*)    Calcium 8.7 (*)    All other components within normal limits  CBC - Abnormal; Notable for the following components:   Hemoglobin 11.3 (*)    HCT 35.2 (*)    MCV 75.9 (*)    MCH 24.4 (*)    RDW 16.6 (*)    All other components within normal limits  TROPONIN I (HIGH SENSITIVITY)  TROPONIN I (HIGH SENSITIVITY)   ____________________________________________  EKG  ED ECG REPORT I, Lavonia Drafts, the attending physician, personally viewed and interpreted this ECG.  Date: 09/16/2019  Rhythm: normal sinus rhythm QRS Axis: normal Intervals:  Left bundle branch block ST/T Wave abnormalities: normal Narrative Interpretation: Left bundle branch block  ____________________________________________  RADIOLOGY  Chest x-ray reviewed by me, unremarkable, no effusion or infiltrate CT angiography ____________________________________________   PROCEDURES  Procedure(s) performed: No  Procedures   Critical Care performed: No ____________________________________________   INITIAL IMPRESSION / ASSESSMENT AND PLAN / ED COURSE  Pertinent labs & imaging results that were available during my care of the patient were reviewed by me and considered in my medical decision making (see chart for details).  Patient with a history of a descending thoracic aneurysm, reviewed prior imaging studies diagnosed in 2019 approximately 4 cm.  Given her description of pain radiating from her back to the front concerning for possible dissection.  IV morphine and IV Zofran ordered will order CT angiography.  GFR is normal, labs otherwise unremarkable.  EKG is reassuring.   CT angiography is without acute abnormality, no evidence for aneurysm or dissection.  Delta troponin normal.  Patient is feeling well,, appropriate for discharge at this time with outpatient follow-up cardiology, strict return precautions discussed   ____________________________________________   FINAL CLINICAL IMPRESSION(S) / ED DIAGNOSES  Final diagnoses:  Atypical chest pain        Note:  This document was prepared using Dragon voice recognition software and may include unintentional dictation errors.   Lavonia Drafts, MD 09/16/19 [phone]

## 2019-09-27 ENCOUNTER — Ambulatory Visit (INDEPENDENT_AMBULATORY_CARE_PROVIDER_SITE_OTHER): Payer: Medicare Other | Admitting: Internal Medicine

## 2019-09-27 ENCOUNTER — Other Ambulatory Visit: Payer: Self-pay

## 2019-09-27 ENCOUNTER — Encounter: Payer: Self-pay | Admitting: Internal Medicine

## 2019-09-27 VITALS — BP 110/64 | HR 64 | Ht 64.0 in | Wt 177.5 lb

## 2019-09-27 DIAGNOSIS — I10 Essential (primary) hypertension: Secondary | ICD-10-CM

## 2019-09-27 DIAGNOSIS — I428 Other cardiomyopathies: Secondary | ICD-10-CM | POA: Insufficient documentation

## 2019-09-27 DIAGNOSIS — R079 Chest pain, unspecified: Secondary | ICD-10-CM

## 2019-09-27 NOTE — Patient Instructions (Signed)
We will request records from Dr Merlyn Albert. Please sign the release as you leave.   Medication Instructions:  Your physician recommends that you continue on your current medications as directed. Please refer to the Current Medication list given to you today.  *If you need a refill on your cardiac medications before your next appointment, please call your pharmacy*   Follow-Up: At [facility], you and your health needs are our priority.  As part of our continuing mission to provide you with exceptional heart care, we have created designated Provider Care Teams.  These Care Teams include your primary Cardiologist (physician) and Advanced Practice Providers (APPs -  Physician Assistants and Nurse Practitioners) who all work together to provide you with the care you need, when you need it.  We recommend signing up for the patient portal called "MyChart".  Sign up information is provided on this After Visit Summary.  MyChart is used to connect with patients for Virtual Visits (Telemedicine).  Patients are able to view lab/test results, encounter notes, upcoming appointments, etc.  Non-urgent messages can be sent to your provider as well.   To learn more about what you can do with MyChart, go to [email or web address].    Your next appointment:   6 week(s)  The format for your next appointment:   In Person  Provider:    You may see DR Harrell Gave END or one of the following Advanced Practice Providers on your designated Care Team:    Murray Hodgkins, NP  Christell Faith, PA-C  Marrianne Mood, PA-C

## 2019-09-27 NOTE — Progress Notes (Signed)
New Outpatient Visit Date: 09/27/2019  Referring Provider: Lavonia Drafts, MD [facility] Emergency Department  Chief Complaint: Chest pain  HPI:  Helen Mccormick is a 79 y.o. female who is being seen today for the evaluation of chest pain at the request of Dr. Corky Downs. She has a history of thoracic aortic aneurysm, heart failure (details unknown), hypertension, hyperlipidemia, obstructive sleep apnea on CPAP, GERD, and hypothyroidism.  She presented to the [facility] emergency department on 6/19 complaining of a 1 week history chest pain.  High-sensitivity troponin I was negative x2.  CTA of the chest showed upper normal to mildly dilated a sending aorta, measuring 3.8 cm in diameter (previously 4.0).  EKG showed old left bundle branch block.  Today, Helen Mccormick reports that she has "pain all over my body."  This includes chest pain that has been present for at least 2 years.  She describes it as a "swelling" feeling that begins in the center of her chest and extends down into her upper abdomen.  She notes associated shortness of breath.  Symptoms seem to be relatively stable over the last 2 years.  She says that strenuous work that she was doing near the [facility] 2 years ago may have brought this on.  She reports having been seen by Dr. Chancy Milroy in the past but does not recall details other than saying that her heart appeared normal.  Further review of records that were obtained after today's office visit are notable for apparent nonischemic cardiomyopathy with LVEF as low as 40%.  Cardiac CTA showed normal coronary arteries.  Follow-up echocardiogram last month showed improved LVEF to 57% with mild to moderate mitral and tricuspid regurgitation.  Ms. Fray denies palpitations, lightheadedness, and edema.  She reports being compliant with her medications.  --------------------------------------------------------------------------------------------------  Cardiovascular History &  Procedures: Cardiovascular Problems:  Chest pain  Thoracic aortic aneurysm  Nonischemic cardiomyopathy  Risk Factors:  Peripheral vascular disease, hypertension, hyperlipidemia, obesity, and age greater than 63  Cath/PCI:  None  CV Surgery:  None  EP Procedures and Devices:  None  Non-Invasive Evaluation(s):  TTE (08/16/2019, [facility]): Normal LV size with mild LVH.  LVEF 57%.  Mild to moderate mitral and tricuspid regurgitation.  Mild biatrial enlargement.  Mildly dilated ascending aorta.  LVEF improved from 40% on prior study.  Cardiac CTA: Reportedly normal.  Recent CV Pertinent Labs: Lab Results  Component Value Date   CHOL 121 11/08/2018   HDL 43 (L) 11/08/2018   LDLCALC 60 11/08/2018   TRIG 97 11/08/2018   CHOLHDL 2.8 11/08/2018   K 3.1 (L) 09/16/2019   K 3.5 07/17/2012   K 3.4 06/14/2008   BUN 31 (H) 09/16/2019   BUN 11 07/17/2012   BUN 20 06/14/2008   CREATININE 0.92 09/16/2019   CREATININE 0.97 (H) 11/08/2018    --------------------------------------------------------------------------------------------------  Past Medical History:  Diagnosis Date  . Allergy   . Anxiety   . CHF (congestive heart failure) ([place])   . Colon polyp   . Depression   . GERD (gastroesophageal reflux disease)   . Heart murmur   . Hyperlipidemia   . Hypertension   . Sleep apnea   . Thyroid disease     Past Surgical History:  Procedure Laterality Date  . ABDOMINAL HYSTERECTOMY    . BACK SURGERY    . BACK SURGERY    . CERVICAL FUSION    . COLONOSCOPY WITH PROPOFOL N/A 05/28/2017   Procedure: COLONOSCOPY WITH PROPOFOL;  Surgeon: Jonathon Bellows,  MD;  Location: [facility] ENDOSCOPY;  Service: Gastroenterology;  Laterality: N/A;  . NECK SURGERY    . THYROID SURGERY    . THYROID SURGERY      Current Meds  Medication Sig  . albuterol (PROVENTIL HFA;VENTOLIN HFA) 108 (90 Base) MCG/ACT inhaler Inhale into the lungs every 6 (six) hours as needed for wheezing or  shortness of breath.  Marland Kitchen amLODipine (NORVASC) 5 MG tablet TAKE 1 TABLET(5 MG) BY MOUTH DAILY FOR BLOOD PRESSURE  . aspirin 81 MG chewable tablet Chew 81 mg by mouth daily.   . calcium carbonate (CALCIUM 600) 600 MG TABS tablet Take 600 mg by mouth 2 (two) times daily with a meal.  . carvedilol (COREG) 3.125 MG tablet Take 3.125 mg by mouth 2 (two) times daily with a meal.   . cholecalciferol (VITAMIN D) 1000 units tablet Take 1 tablet by mouth daily.   Marland Kitchen ENTRESTO 24-26 MG 1 tablet 2 (two) times daily.   Marland Kitchen escitalopram (LEXAPRO) 10 MG tablet TAKE 1 TABLET BY MOUTH EVERY DAY FOR DEPRESSION  . Flaxseed, Linseed, (FLAX SEED OIL) 1000 MG CAPS Take by mouth.  . fluticasone (FLONASE) 50 MCG/ACT nasal spray Place 2 sprays into both nostrils daily.  Marland Kitchen glucose blood test strip Use 1 strip to check your blood sugar two times daily.  . hydrochlorothiazide (HYDRODIURIL) 12.5 MG tablet TAKE 1 TABLET(12.5 MG) BY MOUTH DAILY  . isosorbide mononitrate (IMDUR) 30 MG 24 hr tablet TAKE 1 TABLET(30 MG) BY MOUTH DAILY  . levothyroxine (SYNTHROID, LEVOTHROID) 75 MCG tablet TAKE 1 TABLET BY MOUTH DAILY BEFORE BREAKFAST FOR THYROID  . meloxicam (MOBIC) 15 MG tablet meloxicam 15 mg tablet  Take 1 tablet every day by oral route.  . Multiple Vitamins-Minerals (PRESERVISION AREDS 2 PO) Take by mouth.  . nitroGLYCERIN (NITROSTAT) 0.4 MG SL tablet Place 1 tablet (0.4 mg total) under the tongue every 5 (five) minutes as needed for chest pain.  . Omega-3 1000 MG CAPS Take 1 capsule by mouth daily.   . pantoprazole (PROTONIX) 20 MG tablet Take 1 tablet (20 mg total) by mouth daily. FOR ACID REFLUX AND STOMACH PAIN  . rosuvastatin (CRESTOR) 40 MG tablet TK 1 T PO QD    Allergies: Amlodipine, Tramadol, and Etodolac  Social History   Tobacco Use  . Smoking status: Never Smoker  . Smokeless tobacco: Former Systems developer    Types: Secondary school teacher  . Vaping Use: Never used  Substance Use Topics  . Alcohol use: No  . Drug use: No     Family History  Problem Relation Age of Onset  . Breast cancer Maternal Aunt 60  . Diabetes Mother   . Leukemia Mother   . Heart Problems Mother   . Diabetes Sister   . Mental illness Sister   . Colon polyps Sister   . Stroke Father   . Cancer Brother        rectal  . Healthy Daughter   . Hypertension Son   . Diabetes Sister   . Thyroid disease Sister   . Healthy Sister   . Healthy Sister   . Stroke Brother   . Healthy Son   . Healthy Son   . Healthy Daughter     Review of Systems: A 12-system review of systems was performed and was negative except as noted in the HPI.  --------------------------------------------------------------------------------------------------  Physical Exam: BP 110/64 (BP Location: Right Arm, Patient Position: Sitting, Cuff Size: Normal)   Pulse 64   Ht  5\' 4"  (1.626 m)   Wt 177 lb 8 oz (80.5 kg)   SpO2 96%   BMI 30.47 kg/m   General: NAD. HEENT: No conjunctival pallor or scleral icterus. Facemask in place. Neck: Supple without lymphadenopathy, thyromegaly, JVD, or HJR. No carotid bruit. Lungs: Normal work of breathing. Clear to auscultation bilaterally without wheezes or crackles. Heart: Regular rate and rhythm without murmurs, rubs, or gallops.  Unable to assess PMI due to body habitus. Abd: Bowel sounds present. Soft, NT/ND without hepatosplenomegaly Ext: No lower extremity edema. Radial, PT, and DP pulses are 2+ bilaterally Skin: Warm and dry without rash. Neuro: CNIII-XII intact. Strength and fine-touch sensation intact in upper and lower extremities bilaterally. Psych: Normal mood and affect.  EKG: Normal sinus rhythm with left axis deviation and left bundle branch block.  No significant change since 09/16/2019.  Lab Results  Component Value Date   WBC 9.0 09/16/2019   HGB 11.3 (L) 09/16/2019   HCT 35.2 (L) 09/16/2019   MCV 75.9 (L) 09/16/2019   PLT 210 09/16/2019    Lab Results  Component Value Date   NA 139 09/16/2019    K 3.1 (L) 09/16/2019   CL 98 09/16/2019   CO2 30 09/16/2019   BUN 31 (H) 09/16/2019   CREATININE 0.92 09/16/2019   GLUCOSE 146 (H) 09/16/2019   ALT 14 11/08/2018    Lab Results  Component Value Date   CHOL 121 11/08/2018   HDL 43 (L) 11/08/2018   LDLCALC 60 11/08/2018   TRIG 97 11/08/2018   CHOLHDL 2.8 11/08/2018     --------------------------------------------------------------------------------------------------  ASSESSMENT AND PLAN: Chest pain: Pain is atypical with reported cardiac CTA within the last year or two through Dr. Marella Bile office demonstrating normal coronary arteries.  Recent CTA of the chest showed stable to minimal enlargement of a sending aortic aneurysm without evidence of acute aortic syndrome.  I think it would be reasonable for [name] to perform a pharmacologic myocardial perfusion stress test to exclude ischemia.  For now, we will continue her current medications, including carvedilol and isosorbide mononitrate.  Nonischemic cardiomyopathy: Patient has reported heart failure with LVEF as low as 40% within the last year or 2.  Most recent echo last month through Dr. Marella Bile office showed normalization of LVEF on goal-directed medical therapy consisting of carvedilol and Entresto.  Patient reportedly had cardiac CTA that was normal, though formal report and images are not available.  In light of recent chest pain and prior cardiomyopathy, I recommend that we obtain a pharmacologic myocardial perfusion stress test to exclude significant ischemia.  I will defer repeat echocardiogram at this time, though we will need to reconsider this his LVEF appears reduced on stress test.  The patient appears euvolemic.  Hypertension: Blood pressure well controlled today.  Continue current doses of amlodipine, carvedilol, HCTZ, and losartan.  Follow-up: Return to clinic in 6 weeks, as Ms. Konkle wishes to transition her care from [facility] to our practice.  Nelva Bush,  MD 09/27/2019 8:28 PM

## 2019-09-28 ENCOUNTER — Telehealth: Payer: Self-pay | Admitting: *Deleted

## 2019-09-28 DIAGNOSIS — R079 Chest pain, unspecified: Secondary | ICD-10-CM

## 2019-09-28 NOTE — Telephone Encounter (Signed)
No answer. Left message to call back.   

## 2019-09-28 NOTE — Telephone Encounter (Signed)
-----   Message from Nelva Bush, MD sent at 09/27/2019  8:37 PM EDT ----- Hi Mordechai Matuszak,Could you let Ms. Goding know that I have reviewed her records from Dr. Marella Bile office and recommend that we obtain a pharmacologic myocardial perfusion stress test at her convenience?  Please let me know if any questions come up.  Thanks.Gerald Stabs

## 2019-09-28 NOTE — Telephone Encounter (Signed)
Patient agreeable to [place]. She wrote down and read back the following instructions for the procedure. Message sent to precert.   [place]  Your caregiver has ordered a Stress Test with nuclear imaging. The purpose of this test is to evaluate the blood supply to your heart muscle. This procedure is referred to as a "Non-Invasive Stress Test." This is because other than having an IV started in your vein, nothing is inserted or "invades" your body. Cardiac stress tests are done to find areas of poor blood flow to the heart by determining the extent of coronary artery disease (CAD). Some patients exercise on a treadmill, which naturally increases the blood flow to your heart, while others who are  unable to walk on a treadmill due to physical limitations have a pharmacologic/chemical stress agent called Lexiscan . This medicine will mimic walking on a treadmill by temporarily increasing your coronary blood flow.   Please note: these test may take anywhere between 2-4 hours to complete  PLEASE REPORT TO [place] AT THE FIRST DESK WILL DIRECT YOU WHERE TO GO  Date of Procedure:_______07/6/21_____________  Arrival Time for Procedure:________08:15 am_________   PLEASE NOTIFY THE OFFICE AT LEAST 24 HOURS IN ADVANCE IF YOU ARE UNABLE TO KEEP YOUR APPOINTMENT.  [phone] AND  PLEASE NOTIFY NUCLEAR MEDICINE AT [facility] AT LEAST 24 HOURS IN ADVANCE IF YOU ARE UNABLE TO KEEP YOUR APPOINTMENT. [phone]  How to prepare for your Myoview test:  1. Do not eat or drink after midnight 2. No caffeine for 24 hours prior to test 3. No smoking 24 hours prior to test. 4. Your medication may be taken with water.  If your doctor stopped a medication because of this test, do not take that medication. 5. Ladies, please do not wear dresses.  Skirts or pants are appropriate. Please wear a short sleeve shirt. 6. No perfume, cologne or lotion. 7. Wear  comfortable walking shoes. No heels!

## 2019-10-03 ENCOUNTER — Other Ambulatory Visit: Payer: Self-pay

## 2019-10-03 ENCOUNTER — Other Ambulatory Visit: Admission: RE | Admit: 2019-10-03 | Payer: Medicare Other | Source: Ambulatory Visit

## 2019-10-03 ENCOUNTER — Encounter
Admission: RE | Admit: 2019-10-03 | Discharge: 2019-10-03 | Disposition: A | Discharge status: Home / Self Care | Payer: Medicare Other | Source: Ambulatory Visit | Attending: Internal Medicine | Admitting: Internal Medicine

## 2019-10-03 DIAGNOSIS — R079 Chest pain, unspecified: Secondary | ICD-10-CM | POA: Insufficient documentation

## 2019-10-03 LAB — NM MYOCAR MULTI W/SPECT W/WALL MOTION / EF
LV dias vol: 100 mL (ref 46–106)
LV sys vol: 42 mL
MPHR: 143 {beats}/min
Peak HR: 93 {beats}/min
Percent HR: 65 %
Rest HR: 55 {beats}/min
TID: 0.95

## 2019-10-03 MED ORDER — REGADENOSON 0.4 MG/5ML IV SOLN
0.4000 mg | Freq: Once | INTRAVENOUS | Status: AC
  Administered 2019-10-03: 0.4 mg via INTRAVENOUS
  Filled 2019-10-03: qty 5

## 2019-10-03 MED ORDER — TECHNETIUM TC 99M TETROFOSMIN IV KIT
10.7470 | PACK | Freq: Once | INTRAVENOUS | Status: AC | PRN
  Administered 2019-10-03: 10.747 via INTRAVENOUS

## 2019-10-03 MED ORDER — TECHNETIUM TC 99M TETROFOSMIN IV KIT
30.0000 | PACK | Freq: Once | INTRAVENOUS | Status: AC | PRN
  Administered 2019-10-03: 32.376 via INTRAVENOUS

## 2019-10-04 ENCOUNTER — Encounter: Admission: RE | Payer: Self-pay | Source: Home / Self Care

## 2019-10-04 ENCOUNTER — Ambulatory Visit: Admission: RE | Admit: 2019-10-04 | Payer: Medicare Other | Source: Home / Self Care | Admitting: Internal Medicine

## 2019-10-04 SURGERY — ESOPHAGOGASTRODUODENOSCOPY (EGD) WITH PROPOFOL
Anesthesia: General

## 2019-11-09 ENCOUNTER — Ambulatory Visit: Payer: Medicare Other | Admitting: Physician Assistant

## 2019-11-10 ENCOUNTER — Encounter: Payer: Self-pay | Admitting: Physician Assistant

## 2019-11-15 ENCOUNTER — Other Ambulatory Visit: Payer: Self-pay

## 2019-11-15 ENCOUNTER — Other Ambulatory Visit
Admission: RE | Admit: 2019-11-15 | Discharge: 2019-11-15 | Disposition: A | Discharge status: Home / Self Care | Payer: Medicare Other | Source: Ambulatory Visit | Attending: Gastroenterology | Admitting: Gastroenterology

## 2019-11-15 DIAGNOSIS — Z01812 Encounter for preprocedural laboratory examination: Secondary | ICD-10-CM | POA: Diagnosis present

## 2019-11-15 DIAGNOSIS — Z20822 Contact with and (suspected) exposure to covid-19: Secondary | ICD-10-CM | POA: Diagnosis not present

## 2019-11-15 LAB — SARS CORONAVIRUS 2 (TAT 6-24 HRS): SARS Coronavirus 2: NEGATIVE

## 2019-11-16 ENCOUNTER — Encounter: Payer: Self-pay | Admitting: *Deleted

## 2019-11-17 ENCOUNTER — Ambulatory Visit: Payer: Medicare Other | Admitting: Anesthesiology

## 2019-11-17 ENCOUNTER — Ambulatory Visit
Admission: RE | Admit: 2019-11-17 | Discharge: 2019-11-17 | Disposition: A | Discharge status: Home / Self Care | Payer: Medicare Other | Attending: Gastroenterology | Admitting: Gastroenterology

## 2019-11-17 ENCOUNTER — Encounter
Admission: RE | Disposition: A | Discharge status: Home / Self Care | Payer: Self-pay | Source: Home / Self Care | Attending: Gastroenterology

## 2019-11-17 ENCOUNTER — Encounter: Payer: Self-pay | Admitting: *Deleted

## 2019-11-17 DIAGNOSIS — F419 Anxiety disorder, unspecified: Secondary | ICD-10-CM | POA: Insufficient documentation

## 2019-11-17 DIAGNOSIS — K219 Gastro-esophageal reflux disease without esophagitis: Secondary | ICD-10-CM | POA: Diagnosis not present

## 2019-11-17 DIAGNOSIS — Z79899 Other long term (current) drug therapy: Secondary | ICD-10-CM | POA: Insufficient documentation

## 2019-11-17 DIAGNOSIS — R1013 Epigastric pain: Secondary | ICD-10-CM | POA: Diagnosis not present

## 2019-11-17 DIAGNOSIS — Z1211 Encounter for screening for malignant neoplasm of colon: Secondary | ICD-10-CM | POA: Diagnosis present

## 2019-11-17 DIAGNOSIS — Z7982 Long term (current) use of aspirin: Secondary | ICD-10-CM | POA: Diagnosis not present

## 2019-11-17 DIAGNOSIS — I110 Hypertensive heart disease with heart failure: Secondary | ICD-10-CM | POA: Insufficient documentation

## 2019-11-17 DIAGNOSIS — R1310 Dysphagia, unspecified: Secondary | ICD-10-CM | POA: Diagnosis not present

## 2019-11-17 DIAGNOSIS — Z791 Long term (current) use of non-steroidal anti-inflammatories (NSAID): Secondary | ICD-10-CM | POA: Diagnosis not present

## 2019-11-17 DIAGNOSIS — F329 Major depressive disorder, single episode, unspecified: Secondary | ICD-10-CM | POA: Diagnosis not present

## 2019-11-17 DIAGNOSIS — E119 Type 2 diabetes mellitus without complications: Secondary | ICD-10-CM | POA: Insufficient documentation

## 2019-11-17 DIAGNOSIS — Z79890 Hormone replacement therapy (postmenopausal): Secondary | ICD-10-CM | POA: Diagnosis not present

## 2019-11-17 DIAGNOSIS — Z888 Allergy status to other drugs, medicaments and biological substances status: Secondary | ICD-10-CM | POA: Diagnosis not present

## 2019-11-17 DIAGNOSIS — E785 Hyperlipidemia, unspecified: Secondary | ICD-10-CM | POA: Diagnosis not present

## 2019-11-17 DIAGNOSIS — E039 Hypothyroidism, unspecified: Secondary | ICD-10-CM | POA: Diagnosis not present

## 2019-11-17 DIAGNOSIS — I509 Heart failure, unspecified: Secondary | ICD-10-CM | POA: Diagnosis not present

## 2019-11-17 DIAGNOSIS — K5730 Diverticulosis of large intestine without perforation or abscess without bleeding: Secondary | ICD-10-CM | POA: Insufficient documentation

## 2019-11-17 DIAGNOSIS — K2950 Unspecified chronic gastritis without bleeding: Secondary | ICD-10-CM | POA: Insufficient documentation

## 2019-11-17 DIAGNOSIS — G4730 Sleep apnea, unspecified: Secondary | ICD-10-CM | POA: Diagnosis not present

## 2019-11-17 HISTORY — PX: ESOPHAGOGASTRODUODENOSCOPY (EGD) WITH PROPOFOL: SHX5813

## 2019-11-17 HISTORY — PX: COLONOSCOPY WITH PROPOFOL: SHX5780

## 2019-11-17 HISTORY — DX: Hypothyroidism, unspecified: E03.9

## 2019-11-17 SURGERY — COLONOSCOPY WITH PROPOFOL
Anesthesia: General

## 2019-11-17 MED ORDER — SODIUM CHLORIDE 0.9 % IV SOLN
INTRAVENOUS | Status: DC
  Administered 2019-11-17: 1000 mL via INTRAVENOUS

## 2019-11-17 MED ORDER — PROPOFOL 10 MG/ML IV BOLUS
INTRAVENOUS | Status: DC | PRN
  Administered 2019-11-17: 60 mg via INTRAVENOUS

## 2019-11-17 MED ORDER — PROPOFOL 500 MG/50ML IV EMUL
INTRAVENOUS | Status: DC | PRN
  Administered 2019-11-17: 175 ug/kg/min via INTRAVENOUS

## 2019-11-17 MED ORDER — GLYCOPYRROLATE 0.2 MG/ML IJ SOLN
INTRAMUSCULAR | Status: DC | PRN
  Administered 2019-11-17: .2 mg via INTRAVENOUS

## 2019-11-17 MED ORDER — LIDOCAINE HCL (PF) 2 % IJ SOLN
INTRAMUSCULAR | Status: DC | PRN
  Administered 2019-11-17: 100 mg via INTRADERMAL

## 2019-11-17 NOTE — Anesthesia Preprocedure Evaluation (Signed)
Anesthesia Evaluation  Patient identified by MRN, date of birth, ID band Patient awake    Reviewed: Allergy & Precautions, H&P , NPO status , Patient's Chart, lab work & pertinent test results, reviewed documented beta blocker date and time   History of Anesthesia Complications Negative for: history of anesthetic complications  Airway Mallampati: II   Neck ROM: full    Dental  (+) Dental Advidsory Given, Partial Upper, Partial Lower, Missing, Poor Dentition   Pulmonary neg shortness of breath, sleep apnea and Continuous Positive Airway Pressure Ventilation , neg COPD, neg recent URI,    Pulmonary exam normal        Cardiovascular Exercise Tolerance: Poor hypertension, On Medications (-) angina+CHF  (-) Past MI and (-) Cardiac Stents Normal cardiovascular exam(-) dysrhythmias + Valvular Problems/Murmurs  Rhythm:regular Rate:Normal     Neuro/Psych PSYCHIATRIC DISORDERS Anxiety Depression negative neurological ROS  negative psych ROS   GI/Hepatic Neg liver ROS, GERD  Medicated,  Endo/Other  diabetes (borderline)Hypothyroidism   Renal/GU negative Renal ROS  negative genitourinary   Musculoskeletal   Abdominal   Peds  Hematology negative hematology ROS (+)   Anesthesia Other Findings Past Medical History: No date: Allergy No date: Anxiety No date: Asthma No date: Colon polyp No date: Depression No date: Diabetes mellitus without complication (HCC) No date: GERD (gastroesophageal reflux disease) No date: Heart murmur No date: Hyperlipidemia No date: Hypertension No date: Sleep apnea No date: Thyroid disease Past Surgical History: No date: ABDOMINAL HYSTERECTOMY No date: BACK SURGERY No date: BACK SURGERY No date: CERVICAL FUSION No date: NECK SURGERY No date: THYROID SURGERY No date: THYROID SURGERY BMI    Body Mass Index:  29.52 kg/m     Reproductive/Obstetrics negative OB ROS                              Anesthesia Physical  Anesthesia Plan  ASA: III  Anesthesia Plan: General   Post-op Pain Management:    Induction: Intravenous  PONV Risk Score and Plan: 3 and Propofol infusion and TIVA  Airway Management Planned: Natural Airway and Nasal Cannula  Additional Equipment:   Intra-op Plan:   Post-operative Plan:   Informed Consent: I have reviewed the patients History and Physical, chart, labs and discussed the procedure including the risks, benefits and alternatives for the proposed anesthesia with the patient or authorized representative who has indicated his/her understanding and acceptance.     Dental Advisory Given  Plan Discussed with: CRNA  Anesthesia Plan Comments:         Anesthesia Quick Evaluation

## 2019-11-17 NOTE — Interval H&P Note (Signed)
History and Physical Interval Note:  11/17/2019 10:40 AM  Helen Mccormick  has presented today for surgery, with the diagnosis of SCREENING DYSPHAGIA.  The various methods of treatment have been discussed with the patient and family. After consideration of risks, benefits and other options for treatment, the patient has consented to  Procedure(s): COLONOSCOPY WITH PROPOFOL (N/A) ESOPHAGOGASTRODUODENOSCOPY (EGD) WITH PROPOFOL (N/A) as a surgical intervention.  The patient's history has been reviewed, patient examined, no change in status, stable for surgery.  I have reviewed the patient's chart and labs.  Questions were answered to the patient's satisfaction.     Lesly Rubenstein  Ok to proceed with EGD/Colonoscopy after waiting two hours given recent liquid ingestion.

## 2019-11-17 NOTE — Anesthesia Postprocedure Evaluation (Signed)
Anesthesia Post Note  Patient: [name]  Procedure(s) Performed: COLONOSCOPY WITH PROPOFOL (N/A ) ESOPHAGOGASTRODUODENOSCOPY (EGD) WITH PROPOFOL (N/A )  Patient location during evaluation: Endoscopy Anesthesia Type: General Level of consciousness: awake and alert and oriented Pain management: pain level controlled Vital Signs Assessment: post-procedure vital signs reviewed and stable Respiratory status: spontaneous breathing, nonlabored ventilation and respiratory function stable Cardiovascular status: blood pressure returned to baseline and stable Postop Assessment: no signs of nausea or vomiting Anesthetic complications: no   No complications documented.   Last Vitals:  Vitals:   11/17/19 1301 11/17/19 1311  BP: 104/66 136/80  Pulse: 68 86  Resp: 13 16  Temp:    SpO2: 95% 97%    Last Pain:  Vitals:   11/17/19 1311  TempSrc:   PainSc: 0-No pain                 Helen Mccormick

## 2019-11-17 NOTE — Op Note (Addendum)
[facility] Gastroenterology Patient Name: Helen Mccormick Procedure Date: 11/17/2019 12:18 PM MRN: [id number] Account #: [id number] Date of Birth: 1941/02/18 Admit Type: Outpatient Age: 79 Room: [facility] ENDO ROOM 3 Gender: Female Note Status: Finalized Procedure:             Upper GI endoscopy Indications:           Epigastric abdominal pain, Dysphagia,                         Gastro-esophageal reflux disease Providers:             Andrey Farmer MD, MD Referring MD:          Adrian Prows (Referring MD) Medicines:             Monitored Anesthesia Care Complications:         No immediate complications. Estimated blood loss:                         Minimal. Procedure:             Pre-Anesthesia Assessment:                        - Prior to the procedure, a History and Physical was                         performed, and patient medications and allergies were                         reviewed. The patient is competent. The risks and                         benefits of the procedure and the sedation options and                         risks were discussed with the patient. All questions                         were answered and informed consent was obtained.                         Patient identification and proposed procedure were                         verified by the physician, the nurse, the anesthetist                         and the technician in the endoscopy suite. Mental                         Status Examination: alert and oriented. Airway                         Examination: normal oropharyngeal airway and neck                         mobility. Respiratory Examination: clear to                         auscultation. CV  Examination: normal. Prophylactic                         Antibiotics: The patient does not require prophylactic                         antibiotics. Prior Anticoagulants: The patient has                         taken no previous anticoagulant  or antiplatelet                         agents. ASA Grade Assessment: II - A patient with mild                         systemic disease. After reviewing the risks and                         benefits, the patient was deemed in satisfactory                         condition to undergo the procedure. The anesthesia                         plan was to use monitored anesthesia care (MAC).                         Immediately prior to administration of medications,                         the patient was re-assessed for adequacy to receive                         sedatives. The heart rate, respiratory rate, oxygen                         saturations, blood pressure, adequacy of pulmonary                         ventilation, and response to care were monitored                         throughout the procedure. The physical status of the                         patient was re-assessed after the procedure.                        After obtaining informed consent, the endoscope was                         passed under direct vision. Throughout the procedure,                         the patient's blood pressure, pulse, and oxygen                         saturations were monitored continuously. The Endoscope  was introduced through the mouth, and advanced to the                         second part of duodenum. The upper GI endoscopy was                         accomplished without difficulty. The patient tolerated                         the procedure well. Findings:      The examined esophagus was normal.      Localized mild inflammation characterized by adherent blood was found in       the gastric antrum. Biopsies were taken with a cold forceps for       Helicobacter pylori testing. Estimated blood loss was minimal.      The examined duodenum was normal. Impression:            - Normal esophagus.                        - Chronic gastritis. Biopsied.                        -  Normal examined duodenum. Recommendation:        - Discharge patient to home.                        - Resume previous diet.                        - Continue present medications.                        - Await pathology results.                        - Return to referring physician as previously                         scheduled. Patient described dysphagia more to liquids                         to me. If continued dysphagia would consider barium                         swallow to rule out any subtle rings not seen on                         endoscopy. If persistent dysphagia only to liquids and                         not solids then would consider manometry. Procedure Code(s):     --- Professional ---                        [phone], Esophagogastroduodenoscopy, flexible,                         transoral; with biopsy, single or multiple Diagnosis Code(s):     --- Professional ---  K29.50, Unspecified chronic gastritis without bleeding                        R13.10, Dysphagia, unspecified CPT copyright 2019 American Medical Association. All rights reserved. The codes documented in this report are preliminary and upon coder review may  be revised to meet current compliance requirements. Andrey Farmer, MD Andrey Farmer MD, MD 11/17/2019 12:53:13 PM Number of Addenda: 0 Note Initiated On: 11/17/2019 12:18 PM Estimated Blood Loss:  Estimated blood loss was minimal.      [facility]

## 2019-11-17 NOTE — H&P (Signed)
Outpatient short stay form Pre-procedure 11/17/2019 10:35 AM Helen Miyamoto MD, MPH  Primary Physician: Dr. Ola Spurr  Reason for visit:  Dysphagia/Screening  History of present illness:   79 y/o with DM and other medical problems here for EGD/Colonoscopy for dysphagia that's been going on for years but says to liquids only. Patient denies any family history of GI malignancies. No blood thinners. Patient walked into the endoscopy area drinking her prep. She states she is clear. I gave her the option of rescheduling knowing that if her prep is not adequatte we will have to reschedule anyway but patient wishes to proceed.    Current Facility-Administered Medications:  .  0.9 %  sodium chloride infusion, , Intravenous, Continuous, Helen Mccormick, [name], MD, Last Rate: 20 mL/hr at 11/17/19 1030, 1,000 mL at 11/17/19 1030  Medications Prior to Admission  Medication Sig Dispense Refill Last Dose  . amLODipine (NORVASC) 5 MG tablet TAKE 1 TABLET(5 MG) BY MOUTH DAILY FOR BLOOD PRESSURE 90 tablet 1 11/17/2019 at 0800  . aspirin 81 MG chewable tablet Chew 81 mg by mouth daily.    Past Week at Unknown time  . calcium carbonate (CALCIUM 600) 600 MG TABS tablet Take 600 mg by mouth 2 (two) times daily with a meal.   Past Week at Unknown time  . carvedilol (COREG) 3.125 MG tablet Take 3.125 mg by mouth 2 (two) times daily with a meal.    11/17/2019 at 0800  . cholecalciferol (VITAMIN D) 1000 units tablet Take 1 tablet by mouth daily.    Past Week at Unknown time  . ENTRESTO 24-26 MG 1 tablet 2 (two) times daily.    11/16/2019 at Unknown time  . escitalopram (LEXAPRO) 10 MG tablet TAKE 1 TABLET BY MOUTH EVERY DAY FOR DEPRESSION 90 tablet 1 Past Week at Unknown time  . Flaxseed, Linseed, (FLAX SEED OIL) 1000 MG CAPS Take by mouth.   Past Week at Unknown time  . fluticasone (FLONASE) 50 MCG/ACT nasal spray Place 2 sprays into both nostrils daily.   11/16/2019 at Unknown time  . hydrochlorothiazide (HYDRODIURIL)  12.5 MG tablet TAKE 1 TABLET(12.5 MG) BY MOUTH DAILY 90 tablet 0 11/16/2019 at Unknown time  . isosorbide mononitrate (IMDUR) 30 MG 24 hr tablet TAKE 1 TABLET(30 MG) BY MOUTH DAILY 30 tablet 2 11/16/2019 at Unknown time  . levothyroxine (SYNTHROID, LEVOTHROID) 75 MCG tablet TAKE 1 TABLET BY MOUTH DAILY BEFORE BREAKFAST FOR THYROID 90 tablet 1 11/17/2019 at 0800  . meloxicam (MOBIC) 15 MG tablet meloxicam 15 mg tablet  Take 1 tablet every day by oral route.   Past Week at Unknown time  . Multiple Vitamins-Minerals (PRESERVISION AREDS 2 PO) Take by mouth.   Past Week at Unknown time  . Omega-3 1000 MG CAPS Take 1 capsule by mouth daily.    Past Week at Unknown time  . rosuvastatin (CRESTOR) 40 MG tablet TK 1 T PO QD   Past Week at Unknown time  . albuterol (PROVENTIL HFA;VENTOLIN HFA) 108 (90 Base) MCG/ACT inhaler Inhale into the lungs every 6 (six) hours as needed for wheezing or shortness of breath.    at prn  . glucose blood test strip Use 1 strip to check your blood sugar two times daily. (Patient not taking: Reported on 11/17/2019) 50 each 12 Not Taking at Unknown time  . nitroGLYCERIN (NITROSTAT) 0.4 MG SL tablet Place 1 tablet (0.4 mg total) under the tongue every 5 (five) minutes as needed for chest pain. 50 tablet 3  at prn  . pantoprazole (PROTONIX) 20 MG tablet Take 1 tablet (20 mg total) by mouth daily. FOR ACID REFLUX AND STOMACH PAIN 90 tablet 1      Allergies  Allergen Reactions  . Amlodipine Hives and Swelling  . Tramadol Other (See Comments)    Dizziness   . Etodolac Other (See Comments)    Other reaction(s): Abdominal Pain     Past Medical History:  Diagnosis Date  . Allergy   . Anxiety   . CHF (congestive heart failure) ([place])   . Colon polyp   . Depression   . GERD (gastroesophageal reflux disease)   . Heart murmur   . Hyperlipidemia   . Hypertension   . Hypothyroidism   . Sleep apnea   . Thyroid disease     Review of systems:  Otherwise negative.    Physical  Exam  Gen: Alert, oriented. Appears stated age.  HEENT: [place]/AT. PERRLA. Lungs: No respiratory distress Abd: soft, benign, no masses.  Ext: No edema.     Planned procedures: Proceed with EGD/colonoscopy. The patient understands the nature of the planned procedure, indications, risks, alternatives and potential complications including but not limited to bleeding, infection, perforation, damage to internal organs and possible oversedation/side effects from anesthesia. The patient agrees and gives consent to proceed.  Please refer to procedure notes for findings, recommendations and patient disposition/instructions.     Helen Miyamoto MD, MPH Gastroenterology 11/17/2019  10:35 AM

## 2019-11-17 NOTE — Op Note (Signed)
[facility] Gastroenterology Patient Name: Helen Mccormick Procedure Date: 11/17/2019 12:17 PM MRN: [id number] Account #: [id number] Date of Birth: 1940-04-16 Admit Type: Outpatient Age: 79 Room: [facility] ENDO ROOM 3 Gender: Female Note Status: Finalized Procedure:             Colonoscopy Indications:           Screening for colorectal malignant neoplasm Providers:             Andrey Farmer MD, MD Referring MD:          Adrian Prows (Referring MD) Medicines:             Monitored Anesthesia Care Complications:         No immediate complications. Procedure:             Pre-Anesthesia Assessment:                        - Prior to the procedure, a History and Physical was                         performed, and patient medications and allergies were                         reviewed. The patient is competent. The risks and                         benefits of the procedure and the sedation options and                         risks were discussed with the patient. All questions                         were answered and informed consent was obtained.                         Patient identification and proposed procedure were                         verified by the physician, the nurse, the anesthetist                         and the technician in the endoscopy suite. Mental                         Status Examination: alert and oriented. Airway                         Examination: normal oropharyngeal airway and neck                         mobility. Respiratory Examination: clear to                         auscultation. CV Examination: normal. Prophylactic                         Antibiotics: The patient does not require prophylactic  antibiotics. Prior Anticoagulants: The patient has                         taken no previous anticoagulant or antiplatelet                         agents. ASA Grade Assessment: II - A patient with mild                          systemic disease. After reviewing the risks and                         benefits, the patient was deemed in satisfactory                         condition to undergo the procedure. The anesthesia                         plan was to use monitored anesthesia care (MAC).                         Immediately prior to administration of medications,                         the patient was re-assessed for adequacy to receive                         sedatives. The heart rate, respiratory rate, oxygen                         saturations, blood pressure, adequacy of pulmonary                         ventilation, and response to care were monitored                         throughout the procedure. The physical status of the                         patient was re-assessed after the procedure.                        After obtaining informed consent, the colonoscope was                         passed under direct vision. Throughout the procedure,                         the patient's blood pressure, pulse, and oxygen                         saturations were monitored continuously. The                         Colonoscope was introduced through the anus and                         advanced to the the cecum, identified by appendiceal  orifice and ileocecal valve. The colonoscopy was                         performed without difficulty. The patient tolerated                         the procedure well. The quality of the bowel                         preparation was good. Findings:      The perianal and digital rectal examinations were normal.      A single small-mouthed diverticulum was found in the ascending colon.      The exam was otherwise without abnormality on direct and retroflexion       views. Impression:            - Diverticulosis in the ascending colon.                        - The examination was otherwise normal on direct and                         retroflexion  views.                        - No specimens collected. Recommendation:        - Discharge patient to home.                        - Resume previous diet.                        - Continue present medications.                        - Repeat colonoscopy in 10 years for screening                         purposes.                        - Return to referring physician as previously                         scheduled. Procedure Code(s):     --- Professional ---                        Y1950, Colorectal cancer screening; colonoscopy on                         individual not meeting criteria for high risk Diagnosis Code(s):     --- Professional ---                        Z12.11, Encounter for screening for malignant neoplasm                         of colon                        K57.30, Diverticulosis of large intestine without  perforation or abscess without bleeding CPT copyright 2019 American Medical Association. All rights reserved. The codes documented in this report are preliminary and upon coder review may  be revised to meet current compliance requirements. Andrey Farmer, MD Andrey Farmer MD, MD 11/17/2019 12:56:27 PM Number of Addenda: 0 Note Initiated On: 11/17/2019 12:17 PM Scope Withdrawal Time: 0 hours 7 minutes 17 seconds  Total Procedure Duration: 0 hours 13 minutes 49 seconds  Estimated Blood Loss:  Estimated blood loss: none.      [facility]

## 2019-11-17 NOTE — Transfer of Care (Signed)
Immediate Anesthesia Transfer of Care Note  Patient: Helen Mccormick  Procedure(s) Performed: COLONOSCOPY WITH PROPOFOL (N/A ) ESOPHAGOGASTRODUODENOSCOPY (EGD) WITH PROPOFOL (N/A )  Patient Location: PACU and Endoscopy Unit  Anesthesia Type:General  Level of Consciousness: sedated  Airway & Oxygen Therapy: Patient Spontanous Breathing and Patient connected to nasal cannula oxygen  Post-op Assessment: Report given to RN and Post -op Vital signs reviewed and stable  Post vital signs: Reviewed and stable  Last Vitals:  Vitals Value Taken Time  BP    Temp    Pulse    Resp    SpO2      Last Pain:  Vitals:   11/17/19 1006  PainSc: 0-No pain         Complications: No complications documented.

## 2019-11-17 NOTE — Anesthesia Procedure Notes (Signed)
Date/Time: 11/17/2019 12:29 PM Performed by: Nelda Marseille, CRNA Pre-anesthesia Checklist: Patient identified, Emergency Drugs available, Suction available, Patient being monitored and Timeout performed Oxygen Delivery Method: Nasal cannula

## 2019-11-20 ENCOUNTER — Encounter: Payer: Self-pay | Admitting: Gastroenterology

## 2019-11-22 LAB — SURGICAL PATHOLOGY

## 2019-11-27 NOTE — Progress Notes (Signed)
Office Visit    Patient Name: Helen Mccormick Date of Encounter: 11/28/2019  Primary Care Provider:  Leonel Ramsay, MD Primary Cardiologist:  Nelva Bush, MD Electrophysiologist:  None   Chief Complaint    Helen Mccormick is a 79 y.o. female with a hx of thoracic aortic aneurysm, NICM (EF as low as 40%, now 57%), HTN, HLD, OSA on CPAP, GERD, hypothyroidism presents today for follow up after [facility]    Past Medical History    Past Medical History:  Diagnosis Date  . Allergy   . Anxiety   . CHF (congestive heart failure) ([place])   . Colon polyp   . Depression   . GERD (gastroesophageal reflux disease)   . Heart murmur   . Hyperlipidemia   . Hypertension   . Hypothyroidism   . Sleep apnea   . Thyroid disease    Past Surgical History:  Procedure Laterality Date  . ABDOMINAL HYSTERECTOMY    . BACK SURGERY    . BACK SURGERY    . CERVICAL FUSION    . COLONOSCOPY WITH PROPOFOL N/A 05/28/2017   Procedure: COLONOSCOPY WITH PROPOFOL;  Surgeon: Jonathon Bellows, MD;  Location: [facility] ENDOSCOPY;  Service: Gastroenterology;  Laterality: N/A;  . COLONOSCOPY WITH PROPOFOL N/A 11/17/2019   Procedure: COLONOSCOPY WITH PROPOFOL;  Surgeon: Lesly Rubenstein, MD;  Location: [facility] ENDOSCOPY;  Service: Endoscopy;  Laterality: N/A;  . ESOPHAGOGASTRODUODENOSCOPY (EGD) WITH PROPOFOL N/A 11/17/2019   Procedure: ESOPHAGOGASTRODUODENOSCOPY (EGD) WITH PROPOFOL;  Surgeon: Lesly Rubenstein, MD;  Location: [facility] ENDOSCOPY;  Service: Endoscopy;  Laterality: N/A;  . NECK SURGERY    . THYROID SURGERY    . THYROID SURGERY      Allergies  Allergies  Allergen Reactions  . Amlodipine Hives and Swelling  . Tramadol Other (See Comments)    Dizziness   . Etodolac Other (See Comments)    Other reaction(s): Abdominal Pain    History of Present Illness    Helen Mccormick is a 79 y.o. female with a hx of  thoracic aortic aneurysm, NICM (EF as low as 40%, now 57%), HTN, HLD, OSA on  CPAP, GERD, hypothyroidism. She was last seen by Dr. Saunders Revel 09/27/19.  Previously seen by Dr. Humphrey Rolls with NICM EF as low as 47%. Previous cardiac CTA with normal coronary arteries. Echo 07/2019 with EF 57%, moderate MR/TR.   Seen in consult by Dr. Saunders Revel 09/27/19 for chest pain. Seen in ED 09/16/19 with chest pain. HS troponin negative x2, CTA chest with upper normal to mildly dilated ascending aorta up to 3.8cm (previously 4.0cm). EKG with known LBBB. In clinic she noted "pain all over". She was recommended for [employer].  Subsequent Lexiscan Myoview 10/03/2019 was low risk with no evidence of ischemia.  There was noted large in size moderate severity fixed defect involving septum consistent with artifact due to LBBB.  EF 61%.  Since last seen she has undergone EGD/colonoscopy with Dr. Haig Prophet.  Results showed no H. pylori but small amount of intestinal metaplasia.  She was recommended for repeat EGD in 5 years.  Colonoscopy was normal.  Lexiscan Myoview reviewed.  She reports no recurrent chest pain, pressure, tightness.  Checks her blood pressure at home with readings routinely 120-132/60s.   She tells me she gets dyspnea with more than usual activity. She works out at [employer] and likes to use the treadmill and bicycle.  This does not make her dyspneic nor did give her chest pain.  No  orthopnea nor PND.  Reports no lower extremity edema.  She continues to follow with physical medicine and rehab for degenerative disc disease, hip pain.  EKGs/Labs/Other Studies Reviewed:   The following studies were reviewed today:  Lexiscan Myoview 10/03/19 Low risk, probably normal pharmacologic myocardial perfusion stress test. There is a large in size, moderate in severity, fixed defect involving the septum most consistent with artifact due to LBBB and less likely scar. There is no significant ischemia. The left ventricular ejection fraction is normal (61%) with normal wall motion. There is no significant  coronary artery calcification on the attenuation correction CT. Aortic atherosclerosis is noted  EKG:  EKG is  ordered today.  The ekg ordered today demonstrates SR 68 bpm with left axis deviation and known LBBB. No acute ST/T wave changes.   Recent Labs: 09/16/2019: BUN 31; Creatinine, Ser 0.92; Hemoglobin 11.3; Platelets 210; Potassium 3.1; Sodium 139  Recent Lipid Panel    Component Value Date/Time   CHOL 121 11/08/2018 0806   TRIG 97 11/08/2018 0806   HDL 43 (L) 11/08/2018 0806   CHOLHDL 2.8 11/08/2018 0806   VLDL 23 08/16/2017 0528   LDLCALC 60 11/08/2018 0806    Home Medications   Current Meds  Medication Sig  . albuterol (PROVENTIL HFA;VENTOLIN HFA) 108 (90 Base) MCG/ACT inhaler Inhale into the lungs every 6 (six) hours as needed for wheezing or shortness of breath.  Marland Kitchen amLODipine (NORVASC) 5 MG tablet TAKE 1 TABLET(5 MG) BY MOUTH DAILY FOR BLOOD PRESSURE  . aspirin 81 MG chewable tablet Chew 81 mg by mouth daily.   . Azelastine HCl 137 MCG/SPRAY SOLN SMARTSIG:1-2 Spray(s) Both Nares Twice Daily  . Baclofen 5 MG TABS Take by mouth at bedtime.  . betamethasone dipropionate 0.05 % cream Apply topically.  . calcium carbonate (CALCIUM 600) 600 MG TABS tablet Take 600 mg by mouth 2 (two) times daily with a meal.  . carvedilol (COREG) 3.125 MG tablet Take 3.125 mg by mouth 2 (two) times daily with a meal.   . ENTRESTO 24-26 MG 1 tablet 2 (two) times daily.   . ergocalciferol (VITAMIN D2) 1.25 MG (50000 UT) capsule ergocalciferol (vitamin D2) 1,250 mcg (50,000 unit) capsule  TK 1 C PO 1 TIME A WK  . escitalopram (LEXAPRO) 10 MG tablet TAKE 1 TABLET BY MOUTH EVERY DAY FOR DEPRESSION  . Flaxseed, Linseed, (FLAX SEED OIL) 1000 MG CAPS Take by mouth.  . Flaxseed, Linseed, (FLAXSEED OIL) 1000 MG CAPS Take by mouth.  Cristy Friedlander HFA 44 MCG/ACT inhaler SMARTSIG:2 Puff(s) By Mouth Twice Daily  . fluticasone (FLONASE) 50 MCG/ACT nasal spray Place 2 sprays into both nostrils daily.  . Garlic  3825 MG CAPS Take by mouth daily.  Marland Kitchen glucose blood test strip Use 1 strip to check your blood sugar two times daily.  . hydrochlorothiazide (HYDRODIURIL) 12.5 MG tablet TAKE 1 TABLET(12.5 MG) BY MOUTH DAILY  . isosorbide mononitrate (IMDUR) 30 MG 24 hr tablet TAKE 1 TABLET(30 MG) BY MOUTH DAILY  . levothyroxine (SYNTHROID, LEVOTHROID) 75 MCG tablet TAKE 1 TABLET BY MOUTH DAILY BEFORE BREAKFAST FOR THYROID  . meloxicam (MOBIC) 15 MG tablet meloxicam 15 mg tablet  Take 1 tablet every day by oral route.  . Multiple Vitamins-Minerals (PRESERVISION AREDS 2 PO) Take by mouth.  . nitroGLYCERIN (NITROSTAT) 0.4 MG SL tablet Place 1 tablet (0.4 mg total) under the tongue every 5 (five) minutes as needed for chest pain.  . Omega-3 1000 MG CAPS Take 1 capsule by  mouth daily.   . pantoprazole (PROTONIX) 20 MG tablet Take 1 tablet (20 mg total) by mouth daily. FOR ACID REFLUX AND STOMACH PAIN  . polyethylene glycol powder (GLYCOLAX/MIRALAX) 17 GM/SCOOP powder polyethylene glycol 3350 17 gram/dose oral powder  TK  PO ONCE FOR 1 DOSE  . sucralfate (CARAFATE) 1 g tablet Take by mouth.    Review of Systems    Review of Systems  Constitutional: Negative for chills, fever and malaise/fatigue.  Cardiovascular: Positive for dyspnea on exertion. Negative for chest pain, leg swelling, near-syncope, orthopnea, palpitations and syncope.  Respiratory: Negative for cough, shortness of breath and wheezing.   Gastrointestinal: Negative for nausea and vomiting.  Neurological: Negative for dizziness, light-headedness and weakness.   All other systems reviewed and are otherwise negative except as noted above.  Physical Exam    VS:  BP 120/64 (BP Location: Left Arm, Patient Position: Sitting, Cuff Size: Normal)   Ht 5\' 4"  (1.626 m)   Wt 176 lb 2 oz (79.9 kg)   LMP  (LMP Unknown)   SpO2 98%   BMI 30.23 kg/m  , BMI Body mass index is 30.23 kg/m. GEN: Well nourished, overweight, well developed, in no acute  distress. HEENT: normal. Neck: Supple, no JVD, carotid bruits, or masses. Cardiac: RRR, no murmurs, rubs, or gallops. No clubbing, cyanosis, edema.  Radials/DP/PT 2+ and equal bilaterally.  Respiratory:  Respirations regular and unlabored, clear to auscultation bilaterally. GI: Soft, nontender, nondistended MS: No deformity or atrophy. Skin: Warm and dry, no rash. Neuro:  Strength and sensation are intact. Psych: Normal affect.   Assessment & Plan    1. Chest pain - No recurrence since last seen. Lexiscan Myoview was low risk with no evidence of ischemia.  Reported cardiac CTA within the last 2 years through Dr. Sharol Roussel office with normal coronary arteries.  Continue Coreg and Imdur.  No indication for further ischemic evaluation at this time.  Continue regular cardiovascular exercise.  2. NICM -Echo 08/16/2019 with EF 00%, grade 1 diastolic dysfunction, mild LVH.  EF by Carlton Adam Myoview last month to 61%.  GDMT presently includes Entresto, Coreg, Imdur.  No indication for loop diuretic at this time.  Euvolemic and well compensated on exam. NYHA II with dyspnea with only more than usual activity.  Continue low-sodium, heart healthy diet.  Continue regular cardiovascular exercise. Will send refill of her Coreg and Entresto at present doses as she is transitioning from [facility] to our practice  3. HTN - BP well controlled. Continue current antihypertensive regimen including Entresto 24-26 mg twice daily, Coreg 3.125 mg twice daily, hydrochlorothiazide 12.5 mg daily, Imdur 30 mg daily, amlodipine 5 mg daily.   4. GERD - Well controlled on Protonix. Continue to follow with PCP.  Disposition: Follow up in 4 month(s) with Dr. Saunders Revel or APP   Loel Dubonnet, NP 11/28/2019, 8:59 AM

## 2019-11-28 ENCOUNTER — Ambulatory Visit (INDEPENDENT_AMBULATORY_CARE_PROVIDER_SITE_OTHER): Payer: Medicare Other | Admitting: Family

## 2019-11-28 ENCOUNTER — Other Ambulatory Visit: Payer: Self-pay

## 2019-11-28 ENCOUNTER — Encounter: Payer: Self-pay | Admitting: Family

## 2019-11-28 VITALS — BP 120/64 | Ht 64.0 in | Wt 176.1 lb

## 2019-11-28 DIAGNOSIS — I428 Other cardiomyopathies: Secondary | ICD-10-CM | POA: Diagnosis not present

## 2019-11-28 DIAGNOSIS — R079 Chest pain, unspecified: Secondary | ICD-10-CM

## 2019-11-28 DIAGNOSIS — I10 Essential (primary) hypertension: Secondary | ICD-10-CM

## 2019-11-28 MED ORDER — ENTRESTO 24-26 MG PO TABS: 1.0000 | ORAL_TABLET | Freq: Two times a day (BID) | ORAL | 5 refills | Status: DC

## 2019-11-28 MED ORDER — CARVEDILOL 3.125 MG PO TABS: 3.1250 mg | ORAL_TABLET | Freq: Two times a day (BID) | ORAL | 1 refills | Status: DC

## 2019-11-28 NOTE — Patient Instructions (Signed)
Medication Instructions:  No medication changes today.  *If you need a refill on your cardiac medications before your next appointment, please call your pharmacy*  Lab Work: No lab work today.  Testing/Procedures: Your EKG today was stable and showed normal sinus rhythm with a left bundle branch block - this is a normal finding for you! No signs of blockages.   Your stress test showed no evidence of blockage and a normal heart pumping function. This is a good result!!  Follow-Up: At [facility], you and your health needs are our priority.  As part of our continuing mission to provide you with exceptional heart care, we have created designated Provider Care Teams.  These Care Teams include your primary Cardiologist (physician) and Advanced Practice Providers (APPs -  Physician Assistants and Nurse Practitioners) who all work together to provide you with the care you need, when you need it.  We recommend signing up for the patient portal called "MyChart".  Sign up information is provided on this After Visit Summary.  MyChart is used to connect with patients for Virtual Visits (Telemedicine).  Patients are able to view lab/test results, encounter notes, upcoming appointments, etc.  Non-urgent messages can be sent to your provider as well.   To learn more about what you can do with MyChart, go to [email or web address].    Your next appointment:   4 month(s)  The format for your next appointment:   In Person  Provider:    You may see Nelva Bush, MD or one of the following Advanced Practice Providers on your designated Care Team:   Murray Hodgkins, NP  Christell Faith, PA-C  Marrianne Mood, PA-C  Laurann Montana, NP  Other Instructions   Keep up the good work your diet and exercise! You're doing a great job!  DASH Eating Plan DASH stands for "Dietary Approaches to Stop Hypertension." The DASH eating plan is a healthy eating plan that has been shown to reduce high blood  pressure (hypertension). It may also reduce your risk for type 2 diabetes, heart disease, and stroke. The DASH eating plan may also help with weight loss. What are tips for following this plan?  General guidelines  Avoid eating more than 2,300 mg (milligrams) of salt (sodium) a day. If you have hypertension, you may need to reduce your sodium intake to 1,500 mg a day.  Limit alcohol intake to no more than 1 drink a day for nonpregnant women and 2 drinks a day for men. One drink equals 12 oz of beer, 5 oz of wine, or 1 oz of hard liquor.  Work with your health care provider to maintain a healthy body weight or to lose weight. Ask what an ideal weight is for you.  Get at least 30 minutes of exercise that causes your heart to beat faster (aerobic exercise) most days of the week. Activities may include walking, swimming, or biking.  Work with your health care provider or diet and nutrition specialist (dietitian) to adjust your eating plan to your individual calorie needs. Reading food labels   Check food labels for the amount of sodium per serving. Choose foods with less than 5 percent of the Daily Value of sodium. Generally, foods with less than 300 mg of sodium per serving fit into this eating plan.  To find whole grains, look for the word "whole" as the first word in the ingredient list. Shopping  Buy products labeled as "low-sodium" or "no salt added."  Buy fresh foods. Avoid canned foods  and premade or frozen meals. Cooking  Avoid adding salt when cooking. Use salt-free seasonings or herbs instead of table salt or sea salt. Check with your health care provider or pharmacist before using salt substitutes.  Do not fry foods. Cook foods using healthy methods such as baking, boiling, grilling, and broiling instead.  Cook with heart-healthy oils, such as olive, canola, soybean, or sunflower oil. Meal planning  Eat a balanced diet that includes: ? 5 or more servings of fruits and  vegetables each day. At each meal, try to fill half of your plate with fruits and vegetables. ? Up to 6-8 servings of whole grains each day. ? Less than 6 oz of lean meat, poultry, or fish each day. A 3-oz serving of meat is about the same size as a deck of cards. One egg equals 1 oz. ? 2 servings of low-fat dairy each day. ? A serving of nuts, seeds, or beans 5 times each week. ? Heart-healthy fats. Healthy fats called Omega-3 fatty acids are found in foods such as flaxseeds and coldwater fish, like sardines, salmon, and mackerel.  Limit how much you eat of the following: ? Canned or prepackaged foods. ? Food that is high in trans fat, such as fried foods. ? Food that is high in saturated fat, such as fatty meat. ? Sweets, desserts, sugary drinks, and other foods with added sugar. ? Full-fat dairy products.  Do not salt foods before eating.  Try to eat at least 2 vegetarian meals each week.  Eat more home-cooked food and less restaurant, buffet, and fast food.  When eating at a restaurant, ask that your food be prepared with less salt or no salt, if possible. What foods are recommended? The items listed may not be a complete list. Talk with your dietitian about what dietary choices are best for you. Grains Whole-grain or whole-wheat bread. Whole-grain or whole-wheat pasta. Brown rice. [place]row. Bulgur. Whole-grain and low-sodium cereals. Pita bread. Low-fat, low-sodium crackers. Whole-wheat flour tortillas. Vegetables Fresh or frozen vegetables (raw, steamed, roasted, or grilled). Low-sodium or reduced-sodium tomato and vegetable juice. Low-sodium or reduced-sodium tomato sauce and tomato paste. Low-sodium or reduced-sodium canned vegetables. Fruits All fresh, dried, or frozen fruit. Canned fruit in natural juice (without added sugar). Meat and other protein foods Skinless chicken or [place]. Ground chicken or [place]. Pork with fat trimmed off. Fish and seafood. Egg whites. Dried  beans, peas, or lentils. Unsalted nuts, nut butters, and seeds. Unsalted canned beans. Lean cuts of beef with fat trimmed off. Low-sodium, lean deli meat. Dairy Low-fat (1%) or fat-free (skim) milk. Fat-free, low-fat, or reduced-fat cheeses. Nonfat, low-sodium ricotta or cottage cheese. Low-fat or nonfat yogurt. Low-fat, low-sodium cheese. Fats and oils Soft margarine without trans fats. Vegetable oil. Low-fat, reduced-fat, or light mayonnaise and salad dressings (reduced-sodium). Canola, safflower, olive, soybean, and sunflower oils. Avocado. Seasoning and other foods Herbs. Spices. Seasoning mixes without salt. Unsalted popcorn and pretzels. Fat-free sweets. What foods are not recommended? The items listed may not be a complete list. Talk with your dietitian about what dietary choices are best for you. Grains Baked goods made with fat, such as croissants, muffins, or some breads. Dry pasta or rice meal packs. Vegetables Creamed or fried vegetables. Vegetables in a cheese sauce. Regular canned vegetables (not low-sodium or reduced-sodium). Regular canned tomato sauce and paste (not low-sodium or reduced-sodium). Regular tomato and vegetable juice (not low-sodium or reduced-sodium). Angie Fava. Olives. Fruits Canned fruit in a light or heavy syrup. Fried fruit.  Fruit in cream or butter sauce. Meat and other protein foods Fatty cuts of meat. Ribs. Fried meat. Berniece Salines. Sausage. Bologna and other processed lunch meats. Salami. Fatback. Hotdogs. Bratwurst. Salted nuts and seeds. Canned beans with added salt. Canned or smoked fish. Whole eggs or egg yolks. Chicken or [place] with skin. Dairy Whole or 2% milk, cream, and half-and-half. Whole or full-fat cream cheese. Whole-fat or sweetened yogurt. Full-fat cheese. Nondairy creamers. Whipped toppings. Processed cheese and cheese spreads. Fats and oils Butter. Stick margarine. Lard. Shortening. Ghee. Bacon fat. Tropical oils, such as coconut, palm kernel, or  palm oil. Seasoning and other foods Salted popcorn and pretzels. Onion salt, garlic salt, seasoned salt, table salt, and sea salt. Worcestershire sauce. Tartar sauce. Barbecue sauce. Teriyaki sauce. Soy sauce, including reduced-sodium. Steak sauce. Canned and packaged gravies. Fish sauce. Oyster sauce. Cocktail sauce. Horseradish that you find on the shelf. Ketchup. Mustard. Meat flavorings and tenderizers. Bouillon cubes. Hot sauce and Tabasco sauce. Premade or packaged marinades. Premade or packaged taco seasonings. Relishes. Regular salad dressings. Where to find more information:  National Heart, Lung, and [place]: https://wilson-eaton.com/  American Heart Association: www.heart.org Summary  The DASH eating plan is a healthy eating plan that has been shown to reduce high blood pressure (hypertension). It may also reduce your risk for type 2 diabetes, heart disease, and stroke.  With the DASH eating plan, you should limit salt (sodium) intake to 2,300 mg a day. If you have hypertension, you may need to reduce your sodium intake to 1,500 mg a day.  When on the DASH eating plan, aim to eat more fresh fruits and vegetables, whole grains, lean proteins, low-fat dairy, and heart-healthy fats.  Work with your health care provider or diet and nutrition specialist (dietitian) to adjust your eating plan to your individual calorie needs. This information is not intended to replace advice given to you by your health care provider. Make sure you discuss any questions you have with your health care provider. Document Revised: 02/26/2017 Document Reviewed: 03/09/2016 Elsevier Patient Education  2020 [employer].

## 2019-12-18 ENCOUNTER — Telehealth: Payer: Self-pay | Admitting: Family

## 2019-12-18 NOTE — Telephone Encounter (Signed)
Pt c/o medication issue:  1. Name of Medication: carvedilol  2. How are you currently taking this medication (dosage and times per day)? 3.125 mg once daily in the morning  3. Are you having a reaction (difficulty breathing--STAT)? no  4. What is your medication issue? Makes patient sick, dizzy and anxious. Patient did throw up one morning

## 2019-12-18 NOTE — Telephone Encounter (Signed)
Spoke with the patient. Patient sts that she is taking all of her medications as prescribed. Reviewed with her her medication list.  Patient sts that she has been having n/v and anxiousness that she feels is related to carvedilol. She has tried taking it with food with no improvement of the n/v.  Patient sts that's she checks her BP regularly and it has been on. She does have  BP machine at home. I asked her to ck her BP while I held the line (150/70 67bpm). She has not taken carvedilol today and sts that she feels better.  Advised the patient that I will fwd the message to Laurann Montana, NP for recommendation.

## 2019-12-19 NOTE — Telephone Encounter (Signed)
Low suspicion Carvedilol is causing N/V as this is very uncommon for this medication and she has been on it for some time. Last clinic visit we prescribed a refill, not as a new medication. However, not effective if she is only taking once per day. Please ensure she is taking her Entresto twice per day as prescribed.  If she wishes to remain off the Carvedilol - we may transition to Toprol 25mg  daily. She may take in the morning or evening so long as she takes at the same time per day. The once daily dosing may be easier for her.   Loel Dubonnet, NP

## 2019-12-19 NOTE — Telephone Encounter (Signed)
No answer. Left message to call back.   

## 2019-12-20 NOTE — Telephone Encounter (Signed)
Attempted to call patient. [facility] 12/20/2019

## 2019-12-20 NOTE — Telephone Encounter (Signed)
Patient returning call Please call this afternoon

## 2019-12-22 MED ORDER — METOPROLOL SUCCINATE ER 25 MG PO TB24: 25.0000 mg | ORAL_TABLET | Freq: Every day | ORAL | 5 refills | Status: DC

## 2019-12-22 NOTE — Telephone Encounter (Signed)
Patient verbalized understanding of recommendations. She would like to get off the carvedilol because it makes her feel bad. She's agreeable to the Toprol 25 mg daily. Rx sent to pharmacy. She says she is still taking the Entresto two times a day as prescribed.

## 2020-01-20 ENCOUNTER — Other Ambulatory Visit: Payer: Self-pay | Admitting: Family Medicine

## 2020-01-20 DIAGNOSIS — F419 Anxiety disorder, unspecified: Secondary | ICD-10-CM

## 2020-01-24 ENCOUNTER — Other Ambulatory Visit: Payer: Self-pay

## 2020-01-24 ENCOUNTER — Ambulatory Visit: Payer: Self-pay | Admitting: Family Medicine

## 2020-01-24 ENCOUNTER — Encounter: Payer: Self-pay | Admitting: Family Medicine

## 2020-01-24 DIAGNOSIS — Z113 Encounter for screening for infections with a predominantly sexual mode of transmission: Secondary | ICD-10-CM

## 2020-01-24 NOTE — Progress Notes (Signed)
[facility] Department STI clinic/screening visit  Subjective:  Helen Mccormick is a 79 y.o. female being seen today for an STI screening visit. The patient reports they do not have symptoms.  Patient reports that they do not desire a pregnancy in the next year.   They reported they are not interested in discussing contraception today.  No LMP recorded (lmp unknown). Patient has had a hysterectomy.   Patient has the following medical conditions:   Patient Active Problem List   Diagnosis Date Noted  . Nonischemic cardiomyopathy ([place]) 09/27/2019  . Obstructive sleep apnea syndrome 12/15/2018  . Microcytic anemia 06/29/2018  . Gastritis 06/21/2018  . Macular degeneration of both eyes 10/13/2017  . Chest pain of uncertain etiology 16/12/9602  . Fracture of distal end of radius 06/03/2017  . Hypertension 03/24/2017  . DM (diabetes mellitus), type 2 ([place]) 03/24/2017  . 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 Hyperlipidemia due to type 2 diabetes mellitus ([place]) 03/24/2017  . Cervical radiculitis 12/14/2013  . DDD (degenerative disc disease), cervical 12/14/2013  . Left shoulder pain 12/14/2013    Chief Complaint  Patient presents with  . SEXUALLY TRANSMITTED DISEASE    STD screening including bloodwork    HPI  Patient reports she is here for STD screen.  States that she hasn't been sexually active in 1 year.  Partner is deceased now and she is concerned she may have been exposed to an STD during their 4 years together.  Last HIV test per patient/review of record was unknown Patient reports last pap was many years ago.   See flowsheet for further details and programmatic requirements.    The following portions of the patient's history were reviewed and updated as appropriate: allergies, current medications, past medical history, past social history, past surgical history and problem list.  Objective:  There were no vitals filed for this visit.  Physical  Exam Vitals and nursing note reviewed.  Constitutional:      Appearance: Normal appearance.  HENT:     Head: Normocephalic and atraumatic.     Mouth/Throat:     Mouth: Mucous membranes are moist.     Pharynx: Oropharynx is clear. No oropharyngeal exudate or posterior oropharyngeal erythema.  Pulmonary:     Effort: Pulmonary effort is normal.  Abdominal:     General: Abdomen is flat.     Palpations: There is no mass.     Tenderness: There is no abdominal tenderness. There is no rebound.  Genitourinary:    Comments: Client deferred pelvic exam, self collect GC/chlamydia Lymphadenopathy:     Head:     Right side of head: No preauricular or posterior auricular adenopathy.     Left side of head: No preauricular or posterior auricular adenopathy.     Cervical: No cervical adenopathy.     Upper Body:     Right upper body: No supraclavicular or axillary adenopathy.     Left upper body: No supraclavicular or axillary adenopathy.  Skin:    General: Skin is warm and dry.     Findings: No rash.  Neurological:     Mental Status: She is alert and oriented to person, place, and time.      Assessment and Plan:  [name] is a 79 y.o. female presenting to the [address] for STI screening  1. Screening examination for venereal disease  - Chlamydia/Gonorrhea [facility] - HIV/HCV [facility] - Syphilis Serology, [facility] Client co she would  be contact is any lab work was positive.    Return if symptoms worsen or fail to improve.  Future Appointments  Date Time Provider [place]  04/10/2020 10:20 AM End, Harrell Gave, MD [facility]    Hassell Done, FNP

## 2020-02-15 ENCOUNTER — Telehealth: Payer: Self-pay | Admitting: Family Medicine

## 2020-02-15 NOTE — Telephone Encounter (Signed)
Please call me with my sti test results

## 2020-02-15 NOTE — Telephone Encounter (Signed)
Call to patient, patient requesting results from 01/24/2020 visit. RN discussed results and answered all questions.   Deri Fuelling, RN

## 2020-02-25 ENCOUNTER — Encounter: Payer: Self-pay | Admitting: Emergency Medicine

## 2020-02-25 ENCOUNTER — Emergency Department
Admission: EM | Admit: 2020-02-25 | Discharge: 2020-02-25 | Disposition: A | Discharge status: Home / Self Care | Payer: Medicare Other | Attending: Emergency Medicine | Admitting: Emergency Medicine

## 2020-02-25 ENCOUNTER — Emergency Department: Payer: Medicare Other

## 2020-02-25 ENCOUNTER — Other Ambulatory Visit: Payer: Self-pay

## 2020-02-25 DIAGNOSIS — E119 Type 2 diabetes mellitus without complications: Secondary | ICD-10-CM | POA: Diagnosis not present

## 2020-02-25 DIAGNOSIS — I110 Hypertensive heart disease with heart failure: Secondary | ICD-10-CM | POA: Diagnosis not present

## 2020-02-25 DIAGNOSIS — I509 Heart failure, unspecified: Secondary | ICD-10-CM | POA: Insufficient documentation

## 2020-02-25 DIAGNOSIS — Z79899 Other long term (current) drug therapy: Secondary | ICD-10-CM | POA: Diagnosis not present

## 2020-02-25 DIAGNOSIS — R079 Chest pain, unspecified: Secondary | ICD-10-CM | POA: Diagnosis present

## 2020-02-25 DIAGNOSIS — R0789 Other chest pain: Secondary | ICD-10-CM | POA: Diagnosis not present

## 2020-02-25 DIAGNOSIS — E039 Hypothyroidism, unspecified: Secondary | ICD-10-CM | POA: Diagnosis not present

## 2020-02-25 DIAGNOSIS — Z7982 Long term (current) use of aspirin: Secondary | ICD-10-CM | POA: Diagnosis not present

## 2020-02-25 LAB — CBC
HCT: 36.2 % (ref 36.0–46.0)
Hemoglobin: 11.4 g/dL — ABNORMAL LOW (ref 12.0–15.0)
MCH: 24.3 pg — ABNORMAL LOW (ref 26.0–34.0)
MCHC: 31.5 g/dL (ref 30.0–36.0)
MCV: 77 fL — ABNORMAL LOW (ref 80.0–100.0)
Platelets: 220 10*3/uL (ref 150–400)
RBC: 4.7 MIL/uL (ref 3.87–5.11)
RDW: 16.4 % — ABNORMAL HIGH (ref 11.5–15.5)
WBC: 5.7 10*3/uL (ref 4.0–10.5)
nRBC: 0 % (ref 0.0–0.2)

## 2020-02-25 LAB — BASIC METABOLIC PANEL
Anion gap: 8 (ref 5–15)
BUN: 27 mg/dL — ABNORMAL HIGH (ref 8–23)
CO2: 27 mmol/L (ref 22–32)
Calcium: 9.1 mg/dL (ref 8.9–10.3)
Chloride: 103 mmol/L (ref 98–111)
Creatinine, Ser: 0.99 mg/dL (ref 0.44–1.00)
GFR, Estimated: 59 mL/min — ABNORMAL LOW (ref >60)
Glucose, Bld: 135 mg/dL — ABNORMAL HIGH (ref 70–99)
Potassium: 3.4 mmol/L — ABNORMAL LOW (ref 3.5–5.1)
Sodium: 138 mmol/L (ref 135–145)

## 2020-02-25 LAB — TROPONIN I (HIGH SENSITIVITY)
Troponin I (High Sensitivity): 6 ng/L (ref <18)
Troponin I (High Sensitivity): 6 ng/L (ref <18)

## 2020-02-25 MED ORDER — FAMOTIDINE 20 MG PO TABS: 20.0000 mg | ORAL_TABLET | Freq: Two times a day (BID) | ORAL | 0 refills | Status: DC

## 2020-02-25 MED ORDER — ACETAMINOPHEN 325 MG PO TABS: 650.0000 mg | ORAL_TABLET | Freq: Four times a day (QID) | ORAL | 0 refills | Status: DC | PRN

## 2020-02-25 MED ORDER — ACETAMINOPHEN 500 MG PO TABS
1000.0000 mg | ORAL_TABLET | Freq: Once | ORAL | Status: AC
  Administered 2020-02-25: 1000 mg via ORAL
  Filled 2020-02-25: qty 2

## 2020-02-25 MED ORDER — FAMOTIDINE 20 MG PO TABS
40.0000 mg | ORAL_TABLET | Freq: Once | ORAL | Status: AC
  Administered 2020-02-25: 40 mg via ORAL
  Filled 2020-02-25: qty 2

## 2020-02-25 NOTE — ED Provider Notes (Signed)
[facility] Emergency Department Provider Note  ____________________________________________  Time seen: Approximately 10:16 AM  I have reviewed the triage vital signs and the nursing notes.   HISTORY  Chief Complaint Chest Pain    HPI Helen Mccormick is a 79 y.o. female with a history of GERD, hypertension, OSA, CHF, diabetes who comes ED complaining of central chest pain starting last night.  It is intermittent lasting about a minute at a time, nonradiating, no shortness of breath diaphoresis or vomiting.  Not exertional, not pleuritic.  Feels like pressure.  Denies any recent exertional symptoms, normally walks on a treadmill or in the mall.   She also notes that she has not been drinking enough water lately and has been feeling very thirsty.  She was very stressed and busy with preparing Thanksgiving dinner because she had a lot of family visiting and did a lot of cooking, moving large dishes and heavy pots.   Past Medical History:  Diagnosis Date  . Allergy   . Anxiety   . CHF (congestive heart failure) ([place])   . Colon polyp   . Depression   . GERD (gastroesophageal reflux disease)   . Heart murmur   . Hyperlipidemia   . Hypertension   . Hypothyroidism   . Sleep apnea   . Thyroid disease      Patient Active Problem List   Diagnosis Date Noted  . Nonischemic cardiomyopathy ([place]) 09/27/2019  . Obstructive sleep apnea syndrome 12/15/2018  . Microcytic anemia 06/29/2018  . Gastritis 06/21/2018  . Macular degeneration of both eyes 10/13/2017  . Chest pain of uncertain etiology 82/99/3716  . Fracture of distal end of radius 06/03/2017  . Hypertension 03/24/2017  . DM (diabetes mellitus), type 2 ([place]) 03/24/2017  . 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 Hyperlipidemia due to type 2 diabetes mellitus ([place]) 03/24/2017  . Cervical radiculitis 12/14/2013  . DDD (degenerative disc disease), cervical 12/14/2013  . Left shoulder pain  12/14/2013     Past Surgical History:  Procedure Laterality Date  . ABDOMINAL HYSTERECTOMY    . BACK SURGERY    . BACK SURGERY    . CERVICAL FUSION    . COLONOSCOPY WITH PROPOFOL N/A 05/28/2017   Procedure: COLONOSCOPY WITH PROPOFOL;  Surgeon: Jonathon Bellows, MD;  Location: [facility] ENDOSCOPY;  Service: Gastroenterology;  Laterality: N/A;  . COLONOSCOPY WITH PROPOFOL N/A 11/17/2019   Procedure: COLONOSCOPY WITH PROPOFOL;  Surgeon: Lesly Rubenstein, MD;  Location: [facility] ENDOSCOPY;  Service: Endoscopy;  Laterality: N/A;  . ESOPHAGOGASTRODUODENOSCOPY (EGD) WITH PROPOFOL N/A 11/17/2019   Procedure: ESOPHAGOGASTRODUODENOSCOPY (EGD) WITH PROPOFOL;  Surgeon: Lesly Rubenstein, MD;  Location: [facility] ENDOSCOPY;  Service: Endoscopy;  Laterality: N/A;  . NECK SURGERY    . THYROID SURGERY    . THYROID SURGERY       Prior to Admission medications   Medication Sig Start Date End Date Taking? Authorizing Provider  acetaminophen (TYLENOL) 325 MG tablet Take 2 tablets (650 mg total) by mouth every 6 (six) hours as needed. 02/25/20   Carrie Mew, MD  albuterol (PROVENTIL HFA;VENTOLIN HFA) 108 (90 Base) MCG/ACT inhaler Inhale into the lungs every 6 (six) hours as needed for wheezing or shortness of breath.    [provider]  amLODipine (NORVASC) 5 MG tablet TAKE 1 TABLET(5 MG) BY MOUTH DAILY FOR BLOOD PRESSURE 02/06/19   Parks Ranger, Devonne Doughty, DO  aspirin 81 MG chewable tablet Chew 81 mg by mouth daily.     [provider]  Azelastine  HCl 137 MCG/SPRAY SOLN SMARTSIG:1-2 Spray(s) Both Nares Twice Daily 07/24/19   [provider]  Baclofen 5 MG TABS Take by mouth at bedtime. 11/24/19   [provider]  betamethasone dipropionate 0.05 % cream Apply topically.    [provider]  calcium carbonate (CALCIUM 600) 600 MG TABS tablet Take 600 mg by mouth 2 (two) times daily with a meal.    [provider]  ENTRESTO 24-26 MG Take 1 tablet by mouth 2 (two) times  daily. 11/28/19   Loel Dubonnet, NP  ergocalciferol (VITAMIN D2) 1.25 MG (50000 UT) capsule ergocalciferol (vitamin D2) 1,250 mcg (50,000 unit) capsule  TK 1 C PO 1 TIME A WK    [provider]  escitalopram (LEXAPRO) 10 MG tablet TAKE 1 TABLET BY MOUTH EVERY DAY FOR DEPRESSION 04/25/19   Parks Ranger, Devonne Doughty, DO  famotidine (PEPCID) 20 MG tablet Take 1 tablet (20 mg total) by mouth 2 (two) times daily. 02/25/20   Carrie Mew, MD  Flaxseed, Linseed, (FLAX SEED OIL) 1000 MG CAPS Take by mouth.    [provider]  FLOVENT HFA 44 MCG/ACT inhaler SMARTSIG:2 Puff(s) By Mouth Twice Daily 09/23/19   [provider]  fluticasone (FLONASE) 50 MCG/ACT nasal spray Place 2 sprays into both nostrils daily.    [provider]  Garlic 3810 MG CAPS Take by mouth daily.    [provider]  glucose blood test strip Use 1 strip to check your blood sugar two times daily. 06/21/18   [name], NP  hydrochlorothiazide (HYDRODIURIL) 12.5 MG tablet TAKE 1 TABLET(12.5 MG) BY MOUTH DAILY 04/25/19   Parks Ranger, Devonne Doughty, DO  isosorbide mononitrate (IMDUR) 30 MG 24 hr tablet TAKE 1 TABLET(30 MG) BY MOUTH DAILY 07/24/18   Karamalegos, Devonne Doughty, DO  levothyroxine (SYNTHROID, LEVOTHROID) 75 MCG tablet TAKE 1 TABLET BY MOUTH DAILY BEFORE BREAKFAST FOR THYROID 06/21/18   [name], NP  meloxicam (MOBIC) 15 MG tablet meloxicam 15 mg tablet  Take 1 tablet every day by oral route.    [provider]  metoprolol succinate (TOPROL XL) 25 MG 24 hr tablet Take 1 tablet (25 mg total) by mouth daily. 12/22/19   Loel Dubonnet, NP  Multiple Vitamins-Minerals (PRESERVISION AREDS 2 PO) Take by mouth.    [provider]  nitroGLYCERIN (NITROSTAT) 0.4 MG SL tablet Place 1 tablet (0.4 mg total) under the tongue every 5 (five) minutes as needed for chest pain. 08/20/17   [name], NP  Omega-3 1000 MG CAPS Take 1 capsule by mouth  daily.     [provider]  pantoprazole (PROTONIX) 20 MG tablet Take 1 tablet (20 mg total) by mouth daily. FOR ACID REFLUX AND STOMACH PAIN 06/21/18 11/28/19  [name], NP  polyethylene glycol powder (GLYCOLAX/MIRALAX) 17 GM/SCOOP powder polyethylene glycol 3350 17 gram/dose oral powder  TK  PO ONCE FOR 1 DOSE    [provider]  sucralfate (CARAFATE) 1 g tablet Take by mouth. 05/16/19 05/15/20  [provider]     Allergies Amlodipine, Tramadol, and Etodolac   Family History  Problem Relation Age of Onset  . Breast cancer Maternal Aunt 60  . Diabetes Mother   . Leukemia Mother   . Heart Problems Mother   . Diabetes Sister   . Mental illness Sister   . Colon polyps Sister   . Stroke Father   . Cancer Brother        rectal  .  Healthy Daughter   . Hypertension Son   . Diabetes Sister   . Thyroid disease Sister   . Healthy Sister   . Healthy Sister   . Stroke Brother   . Healthy Son   . Healthy Son   . Healthy Daughter     Social History Social History   Tobacco Use  . Smoking status: Never Smoker  . Smokeless tobacco: Former Systems developer    Types: Secondary school teacher  . Vaping Use: Never used  Substance Use Topics  . Alcohol use: No  . Drug use: No    Review of Systems  Constitutional:   No fever or chills.  ENT:   No sore throat. No rhinorrhea. Cardiovascular: Positive chest pain without syncope. Respiratory:   No dyspnea or cough. Gastrointestinal:   Negative for abdominal pain, vomiting and diarrhea.  Musculoskeletal:   Negative for focal pain or swelling All other systems reviewed and are negative except as documented above in ROS and HPI.  ____________________________________________   PHYSICAL EXAM:  VITAL SIGNS: ED Triage Vitals  Enc Vitals Group     BP 02/25/20 0741 (!) 144/56     Pulse Rate 02/25/20 0741 (!) 54     Resp 02/25/20 0741 20     Temp 02/25/20 0741 98.1 F (36.7 C)     Temp Source 02/25/20 0741 Oral      SpO2 02/25/20 0741 97 %     Weight 02/25/20 0739 170 lb (77.1 kg)     Height 02/25/20 0739 5\' 4"  (1.626 m)     Head Circumference --      Peak Flow --      Pain Score 02/25/20 0739 10     Pain Loc --      Pain Edu? --      Excl. in [place]? --     Vital signs reviewed, nursing assessments reviewed.   Constitutional:   Alert and oriented. Non-toxic appearance. Eyes:   Conjunctivae are normal. EOMI. PERRL. ENT      Head:   Normocephalic and atraumatic.      Nose:   Normal.      Mouth/Throat: Normal      Neck:   No meningismus. Full ROM. Hematological/Lymphatic/Immunilogical:   No cervical lymphadenopathy. Cardiovascular:   Bradycardia heart rate 55. Symmetric bilateral radial and DP pulses.  No murmurs. Cap refill less than 2 seconds. Respiratory:   Normal respiratory effort without tachypnea/retractions. Breath sounds are clear and equal bilaterally. No wheezes/rales/rhonchi. Gastrointestinal:   Soft and nontender. Non distended. There is no CVA tenderness.  No rebound, rigidity, or guarding. Musculoskeletal:   Normal range of motion in all extremities. No joint effusions.  No lower extremity tenderness.  No edema.  There is chest wall tenderness over the sternum which reproduces her pain Neurologic:   Normal speech and language.  Motor grossly intact. No acute focal neurologic deficits are appreciated.  Skin:    Skin is warm, dry and intact. No rash noted.  No petechiae, purpura, or bullae.  ____________________________________________    LABS (pertinent positives/negatives) (all labs ordered are listed, but only abnormal results are displayed) Labs Reviewed  BASIC METABOLIC PANEL - Abnormal; Notable for the following components:      Result Value   Potassium 3.4 (*)    Glucose, Bld 135 (*)    BUN 27 (*)    GFR, Estimated 59 (*)    All other components within normal limits  CBC - Abnormal; Notable for the following  components:   Hemoglobin 11.4 (*)    MCV 77.0 (*)     MCH 24.3 (*)    RDW 16.4 (*)    All other components within normal limits  TROPONIN I (HIGH SENSITIVITY)  TROPONIN I (HIGH SENSITIVITY)   ____________________________________________   EKG  Interpreted by me Sinus bradycardia rate of 54, normal axis, left bundle branch block, no acute ischemic changes  ____________________________________________    RADIOLOGY  DG Chest 2 View  Result Date: 02/25/2020 CLINICAL DATA:  Chest pain and pressure since last night. Denies shortness of breath. EXAM: CHEST - 2 VIEW COMPARISON:  09/16/2019 FINDINGS: The heart size and mediastinal contours are within normal limits. Lingular opacity may represent early infiltrate versus atelectasis. The visualized skeletal structures are unremarkable. IMPRESSION: Lingular opacity consistent with atelectasis or infiltrate. Correlate for any clinical signs or symptoms of pneumonia. Electronically Signed   By: Kerby Moors M.D.   On: 02/25/2020 08:06    ____________________________________________   PROCEDURES Procedures  ____________________________________________  DIFFERENTIAL DIAGNOSIS   GERD, dehydration, non-STEMI, pleural effusion, pneumonia, chest wall pain  CLINICAL IMPRESSION / ASSESSMENT AND PLAN / ED COURSE  Medications ordered in the ED: Medications  acetaminophen (TYLENOL) tablet 1,000 mg (1,000 mg Oral Given 02/25/20 1001)  famotidine (PEPCID) tablet 40 mg (40 mg Oral Given 02/25/20 1001)    Pertinent labs & imaging results that were available during my care of the patient were reviewed by me and considered in my medical decision making (see chart for details).  Zana Biancardi Parthasarathy was evaluated in Emergency Department on 02/25/2020 for the symptoms described in the history of present illness. She was evaluated in the context of the global COVID-19 pandemic, which necessitated consideration that the patient might be at risk for infection with the SARS-CoV-2 virus that causes COVID-19.  Institutional protocols and algorithms that pertain to the evaluation of patients at risk for COVID-19 are in a state of rapid change based on information released by regulatory bodies including the CDC and federal and state organizations. These policies and algorithms were followed during the patient's care in the ED.   Patient presents with atypical chest pain, reproducible on exam.  Not exertional, presentation is noncardiac, most likely muscular.  With her comorbidities, will trend troponins for further stratification, at which point I think she will be suitable for discharge home and continued outpatient follow-up.  Chest x-ray image viewed by me, unremarkable.  Radiology report agrees with no acute findings.  There is a suggestion of faint lingular infiltrate, but presentation is not clinically consistent with pneumonia.   ----------------------------------------- 11:00 AM on 02/25/2020 -----------------------------------------  Serial troponins are normal, vitals remain stable.  Feeling better, stable for outpatient follow-up.     ____________________________________________   FINAL CLINICAL IMPRESSION(S) / ED DIAGNOSES    Final diagnoses:  Chest wall pain  Atypical chest pain     ED Discharge Orders         Ordered    acetaminophen (TYLENOL) 325 MG tablet  Every 6 hours PRN        02/25/20 1100    famotidine (PEPCID) 20 MG tablet  2 times daily        02/25/20 1100          Portions of this note were generated with dragon dictation software. Dictation errors may occur despite best attempts at proofreading.   Carrie Mew, MD 02/25/20 1100

## 2020-02-25 NOTE — ED Triage Notes (Signed)
Pt reports chest pain to her mid chest that is pressure like in nature and intermittent since last pm. Pt denies SOB or nausea with the pain.

## 2020-04-10 ENCOUNTER — Ambulatory Visit: Payer: Medicare Other | Admitting: Internal Medicine

## 2020-04-30 ENCOUNTER — Other Ambulatory Visit: Payer: Self-pay | Admitting: Physical Medicine & Rehabilitation

## 2020-04-30 ENCOUNTER — Other Ambulatory Visit (HOSPITAL_COMMUNITY): Payer: Self-pay | Admitting: Physical Medicine & Rehabilitation

## 2020-04-30 DIAGNOSIS — M4802 Spinal stenosis, cervical region: Secondary | ICD-10-CM

## 2020-04-30 DIAGNOSIS — M5412 Radiculopathy, cervical region: Secondary | ICD-10-CM

## 2020-04-30 DIAGNOSIS — M5442 Lumbago with sciatica, left side: Secondary | ICD-10-CM

## 2020-04-30 DIAGNOSIS — M542 Cervicalgia: Secondary | ICD-10-CM

## 2020-04-30 DIAGNOSIS — M5030 Other cervical disc degeneration, unspecified cervical region: Secondary | ICD-10-CM

## 2020-04-30 DIAGNOSIS — G8929 Other chronic pain: Secondary | ICD-10-CM

## 2020-05-03 DIAGNOSIS — J3081 Allergic rhinitis due to animal (cat) (dog) hair and dander: Secondary | ICD-10-CM | POA: Insufficient documentation

## 2020-05-03 DIAGNOSIS — K219 Gastro-esophageal reflux disease without esophagitis: Secondary | ICD-10-CM | POA: Insufficient documentation

## 2020-05-03 DIAGNOSIS — J301 Allergic rhinitis due to pollen: Secondary | ICD-10-CM | POA: Insufficient documentation

## 2020-05-08 ENCOUNTER — Other Ambulatory Visit: Payer: Self-pay

## 2020-05-08 ENCOUNTER — Ambulatory Visit
Admission: RE | Admit: 2020-05-08 | Discharge: 2020-05-08 | Disposition: A | Discharge status: Home / Self Care | Payer: Medicare Other | Source: Ambulatory Visit | Attending: Physical Medicine & Rehabilitation | Admitting: Physical Medicine & Rehabilitation

## 2020-05-08 DIAGNOSIS — M4802 Spinal stenosis, cervical region: Secondary | ICD-10-CM

## 2020-05-08 DIAGNOSIS — G8929 Other chronic pain: Secondary | ICD-10-CM | POA: Insufficient documentation

## 2020-05-08 DIAGNOSIS — M5412 Radiculopathy, cervical region: Secondary | ICD-10-CM | POA: Diagnosis present

## 2020-05-08 DIAGNOSIS — M5030 Other cervical disc degeneration, unspecified cervical region: Secondary | ICD-10-CM | POA: Diagnosis not present

## 2020-05-08 DIAGNOSIS — M542 Cervicalgia: Secondary | ICD-10-CM | POA: Diagnosis present

## 2020-05-08 DIAGNOSIS — M5442 Lumbago with sciatica, left side: Secondary | ICD-10-CM | POA: Diagnosis present

## 2020-05-16 ENCOUNTER — Other Ambulatory Visit: Payer: Self-pay | Admitting: Family

## 2020-05-16 NOTE — Telephone Encounter (Signed)
Declined . Not taking medication and not seeing provider anymore. Close encounter.

## 2020-05-16 NOTE — Telephone Encounter (Signed)
Please schedule overdue F/U appointment. Thank you! ?

## 2020-06-11 ENCOUNTER — Other Ambulatory Visit: Payer: Self-pay | Admitting: Infectious Diseases

## 2020-06-11 DIAGNOSIS — H1045 Other chronic allergic conjunctivitis: Secondary | ICD-10-CM | POA: Insufficient documentation

## 2020-06-11 DIAGNOSIS — Z1231 Encounter for screening mammogram for malignant neoplasm of breast: Secondary | ICD-10-CM

## 2020-06-11 DIAGNOSIS — J4520 Mild intermittent asthma, uncomplicated: Secondary | ICD-10-CM | POA: Insufficient documentation

## 2020-07-04 ENCOUNTER — Telehealth: Payer: Self-pay | Admitting: Family

## 2020-07-04 NOTE — Telephone Encounter (Signed)
Please schedule overdue F/U appointment. Thank you! ?

## 2020-07-04 NOTE — Telephone Encounter (Signed)
Patient states she cannot schedule at this time Too many appointments Told her she may need to reach out to PCP for medications

## 2020-07-05 ENCOUNTER — Other Ambulatory Visit: Payer: Self-pay

## 2020-07-05 ENCOUNTER — Ambulatory Visit
Admission: RE | Admit: 2020-07-05 | Discharge: 2020-07-05 | Disposition: A | Discharge status: Home / Self Care | Payer: Medicare Other | Source: Ambulatory Visit | Attending: Infectious Diseases | Admitting: Infectious Diseases

## 2020-07-05 DIAGNOSIS — Z1231 Encounter for screening mammogram for malignant neoplasm of breast: Secondary | ICD-10-CM | POA: Insufficient documentation

## 2020-07-12 NOTE — Telephone Encounter (Signed)
What is the status? Does she still need a visit?

## 2020-07-15 NOTE — Telephone Encounter (Signed)
Called patient and was told she would prefer to discuss with her PCP and continue with them for refills

## 2020-07-15 NOTE — Telephone Encounter (Signed)
Dr. Saunders Revel has refilled the medication. She does need a visit, when she is ready to schedule and will likely need to ask PCP to follow her meds if she does not want to schedule with [name].

## 2020-07-22 ENCOUNTER — Other Ambulatory Visit: Payer: Self-pay

## 2020-07-22 ENCOUNTER — Encounter: Payer: Self-pay | Admitting: Emergency Medicine

## 2020-07-22 ENCOUNTER — Emergency Department
Admission: EM | Admit: 2020-07-22 | Discharge: 2020-07-23 | Disposition: A | Discharge status: Home / Self Care | Payer: Medicare Other | Attending: Emergency Medicine | Admitting: Emergency Medicine

## 2020-07-22 ENCOUNTER — Emergency Department: Payer: Medicare Other

## 2020-07-22 DIAGNOSIS — R4781 Slurred speech: Secondary | ICD-10-CM | POA: Insufficient documentation

## 2020-07-22 DIAGNOSIS — E039 Hypothyroidism, unspecified: Secondary | ICD-10-CM | POA: Insufficient documentation

## 2020-07-22 DIAGNOSIS — R42 Dizziness and giddiness: Secondary | ICD-10-CM | POA: Diagnosis present

## 2020-07-22 DIAGNOSIS — I509 Heart failure, unspecified: Secondary | ICD-10-CM | POA: Diagnosis not present

## 2020-07-22 DIAGNOSIS — Z7982 Long term (current) use of aspirin: Secondary | ICD-10-CM | POA: Insufficient documentation

## 2020-07-22 DIAGNOSIS — Z79899 Other long term (current) drug therapy: Secondary | ICD-10-CM | POA: Diagnosis not present

## 2020-07-22 DIAGNOSIS — Z87891 Personal history of nicotine dependence: Secondary | ICD-10-CM | POA: Diagnosis not present

## 2020-07-22 DIAGNOSIS — E119 Type 2 diabetes mellitus without complications: Secondary | ICD-10-CM | POA: Diagnosis not present

## 2020-07-22 DIAGNOSIS — R531 Weakness: Secondary | ICD-10-CM | POA: Diagnosis not present

## 2020-07-22 DIAGNOSIS — I110 Hypertensive heart disease with heart failure: Secondary | ICD-10-CM | POA: Diagnosis not present

## 2020-07-22 LAB — CBC WITH DIFFERENTIAL/PLATELET
Abs Immature Granulocytes: 0.01 10*3/uL (ref 0.00–0.07)
Basophils Absolute: 0 10*3/uL (ref 0.0–0.1)
Basophils Relative: 1 %
Eosinophils Absolute: 0.2 10*3/uL (ref 0.0–0.5)
Eosinophils Relative: 3 %
HCT: 32.2 % — ABNORMAL LOW (ref 36.0–46.0)
Hemoglobin: 10.2 g/dL — ABNORMAL LOW (ref 12.0–15.0)
Immature Granulocytes: 0 %
Lymphocytes Relative: 49 %
Lymphs Abs: 3.2 10*3/uL (ref 0.7–4.0)
MCH: 24.1 pg — ABNORMAL LOW (ref 26.0–34.0)
MCHC: 31.7 g/dL (ref 30.0–36.0)
MCV: 75.9 fL — ABNORMAL LOW (ref 80.0–100.0)
Monocytes Absolute: 0.6 10*3/uL (ref 0.1–1.0)
Monocytes Relative: 8 %
Neutro Abs: 2.6 10*3/uL (ref 1.7–7.7)
Neutrophils Relative %: 39 %
Platelets: 182 10*3/uL (ref 150–400)
RBC: 4.24 MIL/uL (ref 3.87–5.11)
RDW: 15.9 % — ABNORMAL HIGH (ref 11.5–15.5)
WBC: 6.6 10*3/uL (ref 4.0–10.5)
nRBC: 0 % (ref 0.0–0.2)

## 2020-07-22 LAB — BASIC METABOLIC PANEL
Anion gap: 11 (ref 5–15)
BUN: 33 mg/dL — ABNORMAL HIGH (ref 8–23)
CO2: 27 mmol/L (ref 22–32)
Calcium: 9.3 mg/dL (ref 8.9–10.3)
Chloride: 102 mmol/L (ref 98–111)
Creatinine, Ser: 1.42 mg/dL — ABNORMAL HIGH (ref 0.44–1.00)
GFR, Estimated: 38 mL/min — ABNORMAL LOW (ref >60)
Glucose, Bld: 123 mg/dL — ABNORMAL HIGH (ref 70–99)
Potassium: 3.5 mmol/L (ref 3.5–5.1)
Sodium: 140 mmol/L (ref 135–145)

## 2020-07-22 MED ORDER — SODIUM CHLORIDE 0.9 % IV BOLUS
1000.0000 mL | Freq: Once | INTRAVENOUS | Status: AC
  Administered 2020-07-22: 1000 mL via INTRAVENOUS

## 2020-07-22 NOTE — ED Notes (Signed)
Pt given phone to answer MRI screening questions.  

## 2020-07-22 NOTE — ED Notes (Signed)
Pt in MRI.

## 2020-07-22 NOTE — ED Provider Notes (Signed)
[facility] Emergency Department Provider Note  ____________________________________________   Event Date/Time   First MD Initiated Contact with Patient 07/22/20 1710     (approximate)  I have reviewed the triage vital signs and the nursing notes.   HISTORY  Chief Complaint Dizziness  HPI Helen Mccormick is a 80 y.o. female with the below history, presents to the ED for evaluation of intermittent dizziness and vertigo. She denies any prodrome or triggers. She notes symptoms over the last month, with increased frequency over the last week. She notes the episodes last several minutes, and recur several times throughout the day. She denies any syncope, tinnitus, or hearing loss. According to her daughter, who has witnessed the episodes, reports some slurred speech, but denies, paralysis, focal deficits, or weakness. She denies recent falls, trauma, or head injury. She is noting some associated chest "weakness". She denies nausea, vomiting, cough, congestion.     Past Medical History:  Diagnosis Date  . Allergy   . Anxiety   . CHF (congestive heart failure) ([place])   . Colon polyp   . Depression   . GERD (gastroesophageal reflux disease)   . Heart murmur   . Hyperlipidemia   . Hypertension   . Hypothyroidism   . Sleep apnea   . Thyroid disease     Patient Active Problem List   Diagnosis Date Noted  . Nonischemic cardiomyopathy ([place]) 09/27/2019  . Obstructive sleep apnea syndrome 12/15/2018  . Microcytic anemia 06/29/2018  . Gastritis 06/21/2018  . Macular degeneration of both eyes 10/13/2017  . Chest pain of uncertain etiology 24/40/1027  . Fracture of distal end of radius 06/03/2017  . Hypertension 03/24/2017  . DM (diabetes mellitus), type 2 ([place]) 03/24/2017  . 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 Hyperlipidemia due to type 2 diabetes mellitus ([place]) 03/24/2017  . Cervical radiculitis 12/14/2013  . DDD (degenerative disc disease),  cervical 12/14/2013  . Left shoulder pain 12/14/2013    Past Surgical History:  Procedure Laterality Date  . ABDOMINAL HYSTERECTOMY    . BACK SURGERY    . BACK SURGERY    . CERVICAL FUSION    . COLONOSCOPY WITH PROPOFOL N/A 05/28/2017   Procedure: COLONOSCOPY WITH PROPOFOL;  Surgeon: Jonathon Bellows, MD;  Location: [facility] ENDOSCOPY;  Service: Gastroenterology;  Laterality: N/A;  . COLONOSCOPY WITH PROPOFOL N/A 11/17/2019   Procedure: COLONOSCOPY WITH PROPOFOL;  Surgeon: Lesly Rubenstein, MD;  Location: [facility] ENDOSCOPY;  Service: Endoscopy;  Laterality: N/A;  . ESOPHAGOGASTRODUODENOSCOPY (EGD) WITH PROPOFOL N/A 11/17/2019   Procedure: ESOPHAGOGASTRODUODENOSCOPY (EGD) WITH PROPOFOL;  Surgeon: Lesly Rubenstein, MD;  Location: [facility] ENDOSCOPY;  Service: Endoscopy;  Laterality: N/A;  . NECK SURGERY    . THYROID SURGERY    . THYROID SURGERY      Prior to Admission medications   Medication Sig Start Date End Date Taking? Authorizing Provider  acetaminophen (TYLENOL) 325 MG tablet Take 2 tablets (650 mg total) by mouth every 6 (six) hours as needed. 02/25/20   Carrie Mew, MD  albuterol (PROVENTIL HFA;VENTOLIN HFA) 108 (90 Base) MCG/ACT inhaler Inhale into the lungs every 6 (six) hours as needed for wheezing or shortness of breath.    [provider]  amLODipine (NORVASC) 5 MG tablet TAKE 1 TABLET(5 MG) BY MOUTH DAILY FOR BLOOD PRESSURE 02/06/19   Parks Ranger, Devonne Doughty, DO  aspirin 81 MG chewable tablet Chew 81 mg by mouth daily.     [provider]  Azelastine HCl 137 MCG/SPRAY SOLN SMARTSIG:1-2 Spray(s)  Both Nares Twice Daily 07/24/19   [provider]  Baclofen 5 MG TABS Take by mouth at bedtime. 11/24/19   [provider]  betamethasone dipropionate 0.05 % cream Apply topically.    [provider]  calcium carbonate (CALCIUM 600) 600 MG TABS tablet Take 600 mg by mouth 2 (two) times daily with a meal.    [provider]  ENTRESTO 24-26  MG Take 1 tablet by mouth 2 (two) times daily. 11/28/19   Loel Dubonnet, NP  ergocalciferol (VITAMIN D2) 1.25 MG (50000 UT) capsule ergocalciferol (vitamin D2) 1,250 mcg (50,000 unit) capsule  TK 1 C PO 1 TIME A WK    [provider]  escitalopram (LEXAPRO) 10 MG tablet TAKE 1 TABLET BY MOUTH EVERY DAY FOR DEPRESSION 04/25/19   Parks Ranger, Devonne Doughty, DO  famotidine (PEPCID) 20 MG tablet Take 1 tablet (20 mg total) by mouth 2 (two) times daily. 02/25/20   Carrie Mew, MD  Flaxseed, Linseed, (FLAX SEED OIL) 1000 MG CAPS Take by mouth.    [provider]  FLOVENT HFA 44 MCG/ACT inhaler SMARTSIG:2 Puff(s) By Mouth Twice Daily 09/23/19   [provider]  fluticasone (FLONASE) 50 MCG/ACT nasal spray Place 2 sprays into both nostrils daily.    [provider]  Garlic 7782 MG CAPS Take by mouth daily.    [provider]  glucose blood test strip Use 1 strip to check your blood sugar two times daily. 06/21/18   [name], NP  hydrochlorothiazide (HYDRODIURIL) 12.5 MG tablet TAKE 1 TABLET(12.5 MG) BY MOUTH DAILY 04/25/19   Parks Ranger, Devonne Doughty, DO  isosorbide mononitrate (IMDUR) 30 MG 24 hr tablet TAKE 1 TABLET(30 MG) BY MOUTH DAILY 07/24/18   Karamalegos, Devonne Doughty, DO  levothyroxine (SYNTHROID, LEVOTHROID) 75 MCG tablet TAKE 1 TABLET BY MOUTH DAILY BEFORE BREAKFAST FOR THYROID 06/21/18   [name], NP  meloxicam (MOBIC) 15 MG tablet meloxicam 15 mg tablet  Take 1 tablet every day by oral route.    [provider]  metoprolol succinate (TOPROL-XL) 25 MG 24 hr tablet TAKE 1 TABLET(25 MG) BY MOUTH DAILY 07/05/20   End, Harrell Gave, MD  Multiple Vitamins-Minerals (PRESERVISION AREDS 2 PO) Take by mouth.    [provider]  nitroGLYCERIN (NITROSTAT) 0.4 MG SL tablet Place 1 tablet (0.4 mg total) under the tongue every 5 (five) minutes as needed for chest pain. 08/20/17   [name], NP  Omega-3 1000 MG  CAPS Take 1 capsule by mouth daily.     [provider]  pantoprazole (PROTONIX) 20 MG tablet Take 1 tablet (20 mg total) by mouth daily. FOR ACID REFLUX AND STOMACH PAIN 06/21/18 11/28/19  [name], NP  polyethylene glycol powder (GLYCOLAX/MIRALAX) 17 GM/SCOOP powder polyethylene glycol 3350 17 gram/dose oral powder  TK  PO ONCE FOR 1 DOSE    [provider]    Allergies Amlodipine, Tramadol, and Etodolac  Family History  Problem Relation Age of Onset  . Breast cancer Maternal Aunt 60  . Diabetes Mother   . Leukemia Mother   . Heart Problems Mother   . Diabetes Sister   . Mental illness Sister   . Colon polyps Sister   . Stroke Father   . Cancer Brother        rectal  . Healthy Daughter   . Hypertension Son   . Diabetes Sister   . Thyroid disease Sister   . Healthy Sister   .  Healthy Sister   . Stroke Brother   . Healthy Son   . Healthy Son   . Healthy Daughter     Social History Social History   Tobacco Use  . Smoking status: Never Smoker  . Smokeless tobacco: Former Systems developer    Types: Secondary school teacher  . Vaping Use: Never used  Substance Use Topics  . Alcohol use: No  . Drug use: No    Review of Systems  Constitutional: No fever/chills Eyes: reports blurry vision ENT: No sore throat. Cardiovascular: Denies chest pain. Respiratory: Denies shortness of breath. Gastrointestinal: No abdominal pain.  No nausea, no vomiting.  No diarrhea.  No constipation. Genitourinary: Negative for dysuria. Musculoskeletal: Negative for back pain. Skin: Negative for rash. Neurological: Positive for headaches, dizziness and vertigo. Denies focal weakness or numbness. ____________________________________________   PHYSICAL EXAM:  VITAL SIGNS: ED Triage Vitals  Enc Vitals Group     BP 07/22/20 1709 (!) 105/59     Pulse Rate 07/22/20 1709 66     Resp 07/22/20 1709 18     Temp 07/22/20 1709 98.1 F (36.7 C)     Temp Source 07/22/20 1709 Oral      SpO2 07/22/20 1709 97 %     Weight 07/22/20 1708 165 lb (74.8 kg)     Height 07/22/20 1708 5\' 4"  (1.626 m)     Head Circumference --      Peak Flow --      Pain Score 07/22/20 1707 0     Pain Loc --      Pain Edu? --      Excl. in [place]? --    Constitutional: Alert and oriented. Well appearing and in no acute distress. Eyes: Conjunctivae are normal. PERRL. EOMI. Normal fundi bilaterally Head: Atraumatic. Nose: No congestion/rhinnorhea. Mouth/Throat: Mucous membranes are moist.  Oropharynx non-erythematous. Neck: No stridor.  No cervical spine tenderness to palpation. Cardiovascular: Normal rate, regular rhythm. Grossly normal heart sounds.  Good peripheral circulation. Respiratory: Normal respiratory effort.  No retractions. Lungs CTAB. Gastrointestinal: Soft and nontender. No distention. No abdominal bruits. No CVA tenderness. Musculoskeletal: No lower extremity tenderness nor edema.  No joint effusions. Neurologic: Cranial nerves II to XII grossly intact.  Normal UE/LE DTRs bilaterally.  No cerebellar ataxia appreciated.  Normal finger-nose exam.  Normal tandem walk on exam.  Normal speech and language. No gross focal neurologic deficits are appreciated. No gait instability. Skin:  Skin is warm, dry and intact. No rash noted. Psychiatric: Mood and affect are normal. Speech and behavior are normal.  ____________________________________________   LABS (all labs ordered are listed, but only abnormal results are displayed)  Labs Reviewed  BASIC METABOLIC PANEL - Abnormal; Notable for the following components:      Result Value   Glucose, Bld 123 (*)    BUN 33 (*)    Creatinine, Ser 1.42 (*)    GFR, Estimated 38 (*)    All other components within normal limits  CBC WITH DIFFERENTIAL/PLATELET - Abnormal; Notable for the following components:   Hemoglobin 10.2 (*)    HCT 32.2 (*)    MCV 75.9 (*)    MCH 24.1 (*)    RDW 15.9 (*)    All other components within normal limits   URINALYSIS, COMPLETE (UACMP) WITH MICROSCOPIC - Abnormal; Notable for the following components:   Color, Urine STRAW (*)    APPearance CLEAR (*)    All other components within normal limits   ____________________________________________  EKG  ____________________________________________  RADIOLOGY Michael Boston, personally viewed and evaluated these images (plain radiographs) as part of my medical decision making, as well as reviewing the written report by the radiologist.  ED MD interpretation:  Agree with report  Official radiology report(s): MR BRAIN WO CONTRAST  Result Date: 07/22/2020 CLINICAL DATA:  Initial evaluation for acute dizziness for 2 weeks. EXAM: MRI HEAD WITHOUT CONTRAST TECHNIQUE: Multiplanar, multiecho pulse sequences of the brain and surrounding structures were obtained without intravenous contrast. COMPARISON:  Prior head CT from 11/16/2015. FINDINGS: Brain: Diffuse prominence of the CSF containing spaces compatible with generalized age-related cerebral atrophy. Patchy and confluent T2/FLAIR hyperintensity seen involving the periventricular, deep, and subcortical white matter of both cerebral hemispheres, nonspecific, but overall moderate in nature. Mild patchy involvement of the pons noted as well. No abnormal foci of restricted diffusion to suggest acute or subacute ischemia. Gray-white matter differentiation maintained. No encephalomalacia to suggest chronic cortical infarction. No foci of susceptibility artifact to suggest acute or chronic intracranial hemorrhage. No mass lesion, midline shift or mass effect. Ventricles normal size without hydrocephalus. No extra-axial fluid collection. Pituitary gland suprasellar region within normal limits. Midline structures intact and normal. Vascular: Major intracranial vascular flow voids are maintained. Skull and upper cervical spine: Craniocervical junction within normal limits. Postsurgical changes partially  visualize within the upper cervical spine. Bone marrow signal intensity normal. No focal marrow replacing lesion. No scalp soft tissue abnormality. Sinuses/Orbits: Patient status post bilateral ocular lens replacement. Globes and orbital soft tissues demonstrate no acute finding. Scattered mucosal thickening noted throughout the paranasal sinuses. No air-fluid levels to suggest acute sinusitis. No mastoid effusion. Inner ear structures grossly unremarkable. Other: None. IMPRESSION: 1. No acute intracranial abnormality. 2. 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 Electronically Signed   By: Jeannine Boga M.D.   On: 07/22/2020 22:45    ____________________________________________   PROCEDURES  Procedure(s) performed (including Critical Care):  Procedures  NS bolus 1000 ml IVP ____________________________________________   INITIAL IMPRESSION / ASSESSMENT AND PLAN / ED COURSE  As part of my medical decision making, I reviewed the following data within the [place] reviewed WNL, Radiograph reviewed NAD and Notes from prior ED visits    Differential diagnosis includes, but is not limited to, alcohol, illicit or prescription medications, or other toxic ingestion; intracranial pathology such as stroke or intracerebral hemorrhage; fever or infectious causes including sepsis; hypoxemia and/or hypercarbia; uremia; trauma; hypertensive encephalopathy; etc.  Geriatric patient ED evaluation of persistent dizziness and vertigo.  She was evaluated for complaint in the ED today.  Labs reassuring and benign at this time.  Patient without any current complaints of dizziness or vertigo, was found to have a normal exam and a negative MRI.  Patient was discharged follow-up with neurology for ongoing symptoms peer return cautions have been  discussed.  ____________________________________________   FINAL CLINICAL IMPRESSION(S) / ED DIAGNOSES  Final diagnoses:  Dizziness     ED Discharge Orders    None      *Please note:  Helen Mccormick was evaluated in Emergency Department on 07/23/2020 for the symptoms described in the history of present illness. She was evaluated in the context of the global COVID-19 pandemic, which necessitated consideration that the patient might be at risk for infection with the SARS-CoV-2 virus that causes COVID-19. Institutional protocols and algorithms that pertain to the evaluation of patients at risk for COVID-19  are in a state of rapid change based on information released by regulatory bodies including the CDC and federal and state organizations. These policies and algorithms were followed during the patient's care in the ED.  Some ED evaluations and interventions may be delayed as a result of limited staffing during and the pandemic.*   Note:  This document was prepared using Dragon voice recognition software and may include unintentional dictation errors.    Melvenia Needles, PA-C 07/23/20 2043    Nance Pear, MD 08/01/20 [phone]

## 2020-07-22 NOTE — ED Triage Notes (Addendum)
Pt comes into the ED via POV c/o dizziness x 2 weeks.  Pt states the room is actually spinning.  Pt denies any h/o vertigo in the past.  Pt denies any recent colds or congestion.  Pt neurologically intact at this time and in NAD. Pt also admits to right otalgia.

## 2020-07-22 NOTE — ED Notes (Signed)
Pt returned from MRI. Pt given message to call son.

## 2020-07-23 LAB — URINALYSIS, COMPLETE (UACMP) WITH MICROSCOPIC
Bacteria, UA: NONE SEEN
Bilirubin Urine: NEGATIVE
Glucose, UA: NEGATIVE mg/dL
Hgb urine dipstick: NEGATIVE
Ketones, ur: NEGATIVE mg/dL
Leukocytes,Ua: NEGATIVE
Nitrite: NEGATIVE
Protein, ur: NEGATIVE mg/dL
Specific Gravity, Urine: 1.013 (ref 1.005–1.030)
pH: 6 (ref 5.0–8.0)

## 2020-07-23 NOTE — Discharge Instructions (Signed)
Your lab tests show a degree of dehydration, and we gave you IV fluids to help with this.  The MRI of your brain was okay, and your urine test does not show any signs of infection.  Please follow-up with your doctor for continued monitoring of your symptoms.

## 2020-08-02 ENCOUNTER — Ambulatory Visit: Payer: Medicare Other | Attending: Internal Medicine

## 2020-08-02 DIAGNOSIS — G4733 Obstructive sleep apnea (adult) (pediatric): Secondary | ICD-10-CM | POA: Diagnosis present

## 2020-08-05 ENCOUNTER — Other Ambulatory Visit: Payer: Self-pay

## 2021-01-07 ENCOUNTER — Ambulatory Visit (INDEPENDENT_AMBULATORY_CARE_PROVIDER_SITE_OTHER): Payer: Medicare Other | Admitting: Podiatry

## 2021-01-07 ENCOUNTER — Other Ambulatory Visit: Payer: Self-pay

## 2021-01-07 DIAGNOSIS — M79675 Pain in left toe(s): Secondary | ICD-10-CM

## 2021-01-07 DIAGNOSIS — M79674 Pain in right toe(s): Secondary | ICD-10-CM | POA: Diagnosis not present

## 2021-01-07 DIAGNOSIS — B351 Tinea unguium: Secondary | ICD-10-CM

## 2021-01-07 DIAGNOSIS — J309 Allergic rhinitis, unspecified: Secondary | ICD-10-CM | POA: Insufficient documentation

## 2021-01-07 NOTE — Progress Notes (Signed)
   SUBJECTIVE Patient presents to office today complaining of elongated, thickened nails that cause pain while ambulating in shoes.  Patient is unable to trim their own nails. Patient is here for further evaluation and treatment.  Past Medical History:  Diagnosis Date   Allergy    Anxiety    CHF (congestive heart failure) ([facility])    Colon polyp    Depression    GERD (gastroesophageal reflux disease)    Heart murmur    Hyperlipidemia    Hypertension    Hypothyroidism    Sleep apnea    Thyroid disease     OBJECTIVE General Patient is awake, alert, and oriented x 3 and in no acute distress. Derm Skin is dry and supple bilateral. Negative open lesions or macerations. Remaining integument unremarkable. Nails are tender, long, thickened and dystrophic with subungual debris, consistent with onychomycosis, 1-5 bilateral. No signs of infection noted. Vasc  DP and PT pedal pulses palpable bilaterally. Temperature gradient within normal limits.  Neuro Epicritic and protective threshold sensation grossly intact bilaterally.  Musculoskeletal Exam No symptomatic pedal deformities noted bilateral. Muscular strength within normal limits.  ASSESSMENT 1.  Pain due to onychomycosis of toenails both  PLAN OF CARE 1. Patient evaluated today.  2. Instructed to maintain good pedal hygiene and foot care.  3. Mechanical debridement of nails 1-5 bilaterally performed using a nail nipper. Filed with dremel without incident.  4. Return to clinic in 3 mos.    Edrick Kins, DPM [facility]  Dr. Edrick Kins, DPM    [address], [place] [address]                Office [phone]  Fax [phone]

## 2021-01-16 ENCOUNTER — Other Ambulatory Visit: Payer: Self-pay | Admitting: Physical Medicine & Rehabilitation

## 2021-01-16 DIAGNOSIS — G8929 Other chronic pain: Secondary | ICD-10-CM

## 2021-01-16 DIAGNOSIS — M48062 Spinal stenosis, lumbar region with neurogenic claudication: Secondary | ICD-10-CM

## 2021-01-20 ENCOUNTER — Ambulatory Visit
Admission: RE | Admit: 2021-01-20 | Discharge: 2021-01-20 | Disposition: A | Discharge status: Home / Self Care | Payer: Medicare Other | Source: Ambulatory Visit | Attending: Physical Medicine & Rehabilitation | Admitting: Physical Medicine & Rehabilitation

## 2021-01-20 ENCOUNTER — Other Ambulatory Visit: Payer: Self-pay

## 2021-01-20 DIAGNOSIS — M5442 Lumbago with sciatica, left side: Secondary | ICD-10-CM | POA: Diagnosis present

## 2021-01-20 DIAGNOSIS — G8929 Other chronic pain: Secondary | ICD-10-CM | POA: Insufficient documentation

## 2021-01-20 DIAGNOSIS — M48062 Spinal stenosis, lumbar region with neurogenic claudication: Secondary | ICD-10-CM | POA: Insufficient documentation

## 2021-02-10 ENCOUNTER — Other Ambulatory Visit: Payer: Self-pay

## 2021-02-10 ENCOUNTER — Emergency Department: Payer: Medicare Other

## 2021-02-10 ENCOUNTER — Encounter: Payer: Self-pay | Admitting: Emergency Medicine

## 2021-02-10 ENCOUNTER — Emergency Department
Admission: EM | Admit: 2021-02-10 | Discharge: 2021-02-10 | Disposition: A | Discharge status: Home / Self Care | Payer: Medicare Other | Attending: Emergency Medicine | Admitting: Emergency Medicine

## 2021-02-10 DIAGNOSIS — Z79899 Other long term (current) drug therapy: Secondary | ICD-10-CM | POA: Insufficient documentation

## 2021-02-10 DIAGNOSIS — Z20822 Contact with and (suspected) exposure to covid-19: Secondary | ICD-10-CM | POA: Diagnosis not present

## 2021-02-10 DIAGNOSIS — K219 Gastro-esophageal reflux disease without esophagitis: Secondary | ICD-10-CM | POA: Diagnosis not present

## 2021-02-10 DIAGNOSIS — E785 Hyperlipidemia, unspecified: Secondary | ICD-10-CM | POA: Diagnosis not present

## 2021-02-10 DIAGNOSIS — J40 Bronchitis, not specified as acute or chronic: Secondary | ICD-10-CM | POA: Diagnosis not present

## 2021-02-10 DIAGNOSIS — Z7952 Long term (current) use of systemic steroids: Secondary | ICD-10-CM | POA: Diagnosis not present

## 2021-02-10 DIAGNOSIS — Z87891 Personal history of nicotine dependence: Secondary | ICD-10-CM | POA: Insufficient documentation

## 2021-02-10 DIAGNOSIS — R079 Chest pain, unspecified: Secondary | ICD-10-CM

## 2021-02-10 DIAGNOSIS — I509 Heart failure, unspecified: Secondary | ICD-10-CM | POA: Insufficient documentation

## 2021-02-10 DIAGNOSIS — E039 Hypothyroidism, unspecified: Secondary | ICD-10-CM | POA: Diagnosis not present

## 2021-02-10 DIAGNOSIS — Z7982 Long term (current) use of aspirin: Secondary | ICD-10-CM | POA: Insufficient documentation

## 2021-02-10 DIAGNOSIS — E1169 Type 2 diabetes mellitus with other specified complication: Secondary | ICD-10-CM | POA: Diagnosis not present

## 2021-02-10 DIAGNOSIS — I110 Hypertensive heart disease with heart failure: Secondary | ICD-10-CM | POA: Insufficient documentation

## 2021-02-10 LAB — CBC
HCT: 34.9 % — ABNORMAL LOW (ref 36.0–46.0)
Hemoglobin: 11.1 g/dL — ABNORMAL LOW (ref 12.0–15.0)
MCH: 24.2 pg — ABNORMAL LOW (ref 26.0–34.0)
MCHC: 31.8 g/dL (ref 30.0–36.0)
MCV: 76 fL — ABNORMAL LOW (ref 80.0–100.0)
Platelets: 188 10*3/uL (ref 150–400)
RBC: 4.59 MIL/uL (ref 3.87–5.11)
RDW: 15.8 % — ABNORMAL HIGH (ref 11.5–15.5)
WBC: 4.5 10*3/uL (ref 4.0–10.5)
nRBC: 0 % (ref 0.0–0.2)

## 2021-02-10 LAB — BASIC METABOLIC PANEL
Anion gap: 6 (ref 5–15)
BUN: 19 mg/dL (ref 8–23)
CO2: 27 mmol/L (ref 22–32)
Calcium: 9 mg/dL (ref 8.9–10.3)
Chloride: 104 mmol/L (ref 98–111)
Creatinine, Ser: 0.84 mg/dL (ref 0.44–1.00)
GFR, Estimated: >60 mL/min (ref >60)
Glucose, Bld: 155 mg/dL — ABNORMAL HIGH (ref 70–99)
Potassium: 3.5 mmol/L (ref 3.5–5.1)
Sodium: 137 mmol/L (ref 135–145)

## 2021-02-10 LAB — RESP PANEL BY RT-PCR (FLU A&B, COVID) ARPGX2
Influenza A by PCR: NEGATIVE
Influenza B by PCR: NEGATIVE
SARS Coronavirus 2 by RT PCR: NEGATIVE

## 2021-02-10 LAB — TROPONIN I (HIGH SENSITIVITY)
Troponin I (High Sensitivity): 5 ng/L (ref <18)
Troponin I (High Sensitivity): 6 ng/L (ref <18)

## 2021-02-10 MED ORDER — BENZONATATE 100 MG PO CAPS: 100.0000 mg | ORAL_CAPSULE | Freq: Three times a day (TID) | ORAL | 0 refills | Status: AC | PRN

## 2021-02-10 MED ORDER — ACETAMINOPHEN 500 MG PO TABS
1000.0000 mg | ORAL_TABLET | Freq: Once | ORAL | Status: AC
  Administered 2021-02-10: 1000 mg via ORAL
  Filled 2021-02-10: qty 2

## 2021-02-10 NOTE — ED Triage Notes (Signed)
Onset this morning.  Arrives from home via [facility].  Had similar symptoms last week and did not have that checked out.  324 mg ASA given PTA.  1 spray of NTG PTA, pain currently 0/10.  VS wnl

## 2021-02-10 NOTE — ED Provider Notes (Signed)
[facility] Provider Note  ____________________________________________   Event Date/Time   First MD Initiated Contact with Patient 02/10/21 1203     (approximate)  I have reviewed the triage vital signs and the nursing notes.   HISTORY  Chief Complaint Chest Pain   HPI Helen [facility] is a 80 y.o. female with a past medical history of CHF, depression, GERD, HTN, HDL, OSA, anxiety, and thyroid disease who presents for assessment of some left-sided chest pain associate with cough and mild sore throat and congestion.  Patient states she has had the pain for about a week feels it got worse in the last 12 hours or so.  She describes as left-sided rating towards her left arm.  She states she has had mild nonproductive cough without any hemoptysis for last couple days.  She has felt warm has not measured any fevers.  No headache, he has felt some pressure in her left ear.  She has not had any nausea, vomiting, diarrhea, burning with urination, abdominal pain, back pain, rash or extremity pain.  No recent falls or injuries.  No other acute concerns at this time.     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   Mild intermittent asthma 06/11/2020   Allergic rhinitis due to animal (cat) (dog) hair and dander 05/03/2020   Allergic rhinitis due to pollen 05/03/2020   Gastro-esophageal reflux disease without esophagitis 05/03/2020   Nonischemic cardiomyopathy ([place]) 09/27/2019   Obstructive sleep apnea syndrome 12/15/2018   Microcytic anemia 06/29/2018   Gastritis 06/21/2018    Macular degeneration of both eyes 10/13/2017   Impingement syndrome of right shoulder region 09/10/2017   Chest pain of uncertain etiology 71/08/2692   Fracture of distal end of radius 06/03/2017   Hypertension 03/24/2017   DM (diabetes mellitus), type 2 ([place]) 03/24/2017   Hypothyroidism, 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   Hyperlipidemia due to type 2 diabetes mellitus ([place]) 03/24/2017   Cervical radiculitis 12/14/2013   DDD (degenerative disc disease), cervical 12/14/2013   Left shoulder pain 12/14/2013    Past Surgical History:  Procedure Laterality Date   ABDOMINAL HYSTERECTOMY     BACK SURGERY     BACK SURGERY     CERVICAL FUSION     COLONOSCOPY WITH PROPOFOL N/A 05/28/2017   Procedure: COLONOSCOPY WITH PROPOFOL;  Surgeon: Jonathon Bellows, MD;  Location[facility] ENDOSCOPY;  Service: Gastroenterology;  Laterality: N/A;   COLONOSCOPY WITH PROPOFOL N/A 11/17/2019   Procedure: COLONOSCOPY WITH PROPOFOL;  Surgeon: Lesly Rubenstein, MD;  Location: [facility] ENDOSCOPY;  Service: Endoscopy;  Laterality: N/A;   ESOPHAGOGASTRODUODENOSCOPY (EGD) WITH PROPOFOL N/A 11/17/2019   Procedure: ESOPHAGOGASTRODUODENOSCOPY (EGD) WITH PROPOFOL;  Surgeon: Lesly Rubenstein, MD;  Location: [facility] ENDOSCOPY;  Service: Endoscopy;  Laterality: N/A;   NECK SURGERY     THYROID SURGERY     THYROID SURGERY      Prior to Admission medications   Medication Sig Start Date End Date Taking? Authorizing Provider  acetaminophen (TYLENOL) 325 MG tablet Take 2 tablets (650  mg total) by mouth every 6 (six) hours as needed. 02/25/20  Yes Carrie Mew, MD  albuterol (PROVENTIL HFA;VENTOLIN HFA) 108 (90 Base) MCG/ACT inhaler Inhale into the lungs every 6 (six) hours as needed for wheezing or shortness of breath.   Yes [provider]  amLODipine (NORVASC) 5 MG tablet TAKE 1 TABLET(5 MG) BY MOUTH DAILY FOR BLOOD PRESSURE 02/06/19  Yes Karamalegos, Devonne Doughty, DO  aspirin 81 MG chewable tablet Chew 81 mg by mouth daily.    Yes  [provider]  atenolol (TENORMIN) 50 MG tablet Take 1 tablet by mouth daily.   Yes [provider]  atorvastatin (LIPITOR) 20 MG tablet Take 1 tablet by mouth daily.   Yes [provider]  Azelastine HCl 137 MCG/SPRAY SOLN SMARTSIG:1-2 Spray(s) Both Nares Twice Daily 07/24/19  Yes [provider]  Baclofen 5 MG TABS Take by mouth at bedtime. 11/24/19  Yes [provider]  benzonatate (TESSALON PERLES) 100 MG capsule Take 1 capsule (100 mg total) by mouth 3 (three) times daily as needed for up to 5 days for cough. 02/10/21 02/15/21 Yes Lucrezia Starch, MD  betamethasone dipropionate 0.05 % cream Apply topically.   Yes [provider]  calcium carbonate (OS-CAL) 600 MG TABS tablet Take 600 mg by mouth 2 (two) times daily with a meal.   Yes [provider]  carvedilol (COREG) 12.5 MG tablet Take 12.5 mg by mouth 2 (two) times daily. 12/16/20  Yes [provider]  cefdinir (OMNICEF) 300 MG capsule Take by mouth. 10/14/20  Yes [provider]  cetirizine (ZYRTEC) 10 MG tablet Take 1 tablet by mouth daily.   Yes [provider]  Cysteamine Bitartrate (PROCYSBI) 300 MG PACK as directed Whichever is preferred by insurance 11/28/20 11/28/21 Yes [provider]  dicyclomine (BENTYL) 10 MG capsule Take 1 capsule by mouth 3 (three) times daily as needed.   Yes [provider]  ENTRESTO 24-26 MG Take 1 tablet by mouth 2 (two) times daily. 11/28/19  Yes Loel Dubonnet, NP  ergocalciferol (VITAMIN D2) 1.25 MG (50000 UT) capsule ergocalciferol (vitamin D2) 1,250 mcg (50,000 unit) capsule  TK 1 C PO 1 TIME A WK   Yes [provider]  escitalopram (LEXAPRO) 10 MG tablet TAKE 1 TABLET BY MOUTH EVERY DAY FOR DEPRESSION 04/25/19  Yes Karamalegos, Devonne Doughty, DO  famotidine (PEPCID) 20 MG tablet Take 1 tablet (20 mg total) by mouth 2 (two) times daily. 02/25/20  Yes Carrie Mew, MD  Flaxseed, Linseed,  (FLAX SEED OIL) 1000 MG CAPS Take by mouth.   Yes [provider]  FLOVENT HFA 44 MCG/ACT inhaler SMARTSIG:2 Puff(s) By Mouth Twice Daily 09/23/19  Yes [provider]  fluticasone (FLONASE) 50 MCG/ACT nasal spray Place 2 sprays into both nostrils daily.   Yes [provider]  hydrochlorothiazide (HYDRODIURIL) 12.5 MG tablet TAKE 1 TABLET(12.5 MG) BY MOUTH DAILY 04/25/19  Yes Karamalegos, Devonne Doughty, DO  isosorbide mononitrate (IMDUR) 30 MG 24 hr tablet TAKE 1 TABLET(30 MG) BY MOUTH DAILY 07/24/18  Yes Karamalegos, Devonne Doughty, DO  levothyroxine (SYNTHROID) 75 MCG tablet Take by mouth. 11/26/20  Yes [provider]  levothyroxine (SYNTHROID, LEVOTHROID) 75 MCG tablet TAKE 1 TABLET BY MOUTH DAILY BEFORE BREAKFAST FOR THYROID 06/21/18  Yes [name], NP  meloxicam (MOBIC) 15 MG tablet meloxicam 15 mg tablet  Take 1 tablet every day by oral route.   Yes [provider]  metoprolol succinate (TOPROL-XL) 25  MG 24 hr tablet TAKE 1 TABLET(25 MG) BY MOUTH DAILY 07/05/20  Yes End, Harrell Gave, MD  Multiple Vitamins-Minerals (PRESERVISION AREDS 2 PO) Take by mouth.   Yes [provider]  nitroGLYCERIN (NITROSTAT) 0.4 MG SL tablet Place 1 tablet (0.4 mg total) under the tongue every 5 (five) minutes as needed for chest pain. 08/20/17  Yes [name], NP  Omega-3 1000 MG CAPS Take 1 capsule by mouth daily.    Yes [provider]  omeprazole (PRILOSEC) 40 MG capsule Take 1 capsule by mouth daily.   Yes [provider]  pantoprazole (PROTONIX) 40 MG tablet Take by mouth. 11/26/20  Yes [provider]  polyethylene glycol powder (GLYCOLAX/MIRALAX) 17 GM/SCOOP powder polyethylene glycol 3350 17 gram/dose oral powder  TK  PO ONCE FOR 1 DOSE   Yes [provider]  rosuvastatin (CRESTOR) 40 MG tablet Take 40 mg by mouth daily. 12/16/20  Yes [provider]  tiZANidine (ZANAFLEX) 2 MG tablet Take 2 mg by  mouth 3 (three) times daily. 01/16/21  Yes [provider]  traMADol (ULTRAM) 50 MG tablet Take 1 tablet by mouth every 6 (six) hours as needed.   Yes [provider]  Blood Glucose Monitoring Suppl (ACCU-CHEK GUIDE) w/Device KIT See admin instructions. 11/28/20   [provider]  Garlic 1410 MG CAPS Take by mouth daily. Patient not taking: No sig reported    [provider]  glucose blood test strip Use 1 strip to check your blood sugar two times daily. 06/21/18   [name], NP  pantoprazole (PROTONIX) 20 MG tablet Take 1 tablet (20 mg total) by mouth daily. FOR ACID REFLUX AND STOMACH PAIN 06/21/18 11/28/19  [name], NP    Allergies Amlodipine, Tramadol, Codeine, and Etodolac  Family History  Problem Relation Age of Onset   Breast cancer Maternal Aunt 60   Diabetes Mother    Leukemia Mother    Heart Problems Mother    Diabetes Sister    Mental illness Sister    Colon polyps Sister    Stroke Father    Cancer Brother        rectal   Healthy Daughter    Hypertension Son    Diabetes Sister    Thyroid disease Sister    Healthy Sister    Healthy Sister    Stroke Brother    Healthy Son    Healthy Son    Healthy Daughter     Social History Social History   Tobacco Use   Smoking status: Never   Smokeless tobacco: Former    Types: Nurse, children's Use: Never used  Substance Use Topics   Alcohol use: No   Drug use: No    Review of Systems  Review of Systems  Constitutional:  Negative for chills and fever.  HENT:  Positive for congestion, ear pain and sore throat.   Eyes:  Negative for pain.  Respiratory:  Positive for cough. Negative for stridor.   Cardiovascular:  Positive for chest pain.  Gastrointestinal:  Negative for vomiting.  Genitourinary:  Negative for dysuria.  Musculoskeletal:  Negative for myalgias.  Skin:  Negative for rash.  Neurological:  Negative for seizures, loss of consciousness  and headaches.  Psychiatric/Behavioral:  Negative for suicidal ideas.   All other systems reviewed and are negative.    ____________________________________________   PHYSICAL EXAM:  VITAL SIGNS: ED Triage Vitals  Enc Vitals Group  BP 02/10/21 1019 107/65     Pulse Rate 02/10/21 1019 66     Resp 02/10/21 1019 16     Temp 02/10/21 1019 98 F (36.7 C)     Temp Source 02/10/21 1019 Oral     SpO2 02/10/21 1019 95 %     Weight 02/10/21 0958 164 lb 14.5 oz (74.8 kg)     Height 02/10/21 0958 _0  (1.626 m)     Head Circumference --      Peak Flow --      Pain Score 02/10/21 0958 0     Pain Loc --      Pain Edu? --      Excl. in [place]? --    Vitals:   02/10/21 1019 02/10/21 1258  BP: 107/65 (!) 121/39  Pulse: 66 (!) 55  Resp: 16 16  Temp: 98 F (36.7 C)   SpO2: 95%    Physical Exam Vitals and nursing note reviewed.  Constitutional:      General: She is not in acute distress.    Appearance: She is well-developed.  HENT:     Head: Normocephalic and atraumatic.     Right Ear: Tympanic membrane and external ear normal.     Left Ear: Tympanic membrane and external ear normal.     Nose: Nose normal.     Mouth/Throat:     Mouth: Mucous membranes are moist.     Pharynx: Posterior oropharyngeal erythema present. No oropharyngeal exudate.  Eyes:     Conjunctiva/sclera: Conjunctivae normal.  Cardiovascular:     Rate and Rhythm: Normal rate and regular rhythm.     Heart sounds: No murmur heard. Pulmonary:     Effort: Pulmonary effort is normal. No respiratory distress.     Breath sounds: Normal breath sounds.  Abdominal:     Palpations: Abdomen is soft.     Tenderness: There is no abdominal tenderness.  Musculoskeletal:     Cervical back: Neck supple.  Skin:    General: Skin is warm and dry.  Neurological:     Mental Status: She is alert and oriented to person, place, and time.  Psychiatric:        Mood and Affect: Mood normal.      ____________________________________________   LABS (all labs ordered are listed, but only abnormal results are displayed)  Labs Reviewed  BASIC METABOLIC PANEL - Abnormal; Notable for the following components:      Result Value   Glucose, Bld 155 (*)    All other components within normal limits  CBC - Abnormal; Notable for the following components:   Hemoglobin 11.1 (*)    HCT 34.9 (*)    MCV 76.0 (*)    MCH 24.2 (*)    RDW 15.8 (*)    All other components within normal limits  RESP PANEL BY RT-PCR (FLU A&B, COVID) ARPGX2  TROPONIN I (HIGH SENSITIVITY)  TROPONIN I (HIGH SENSITIVITY)   ____________________________________________  EKG  ECG remarkable for sinus rhythm with a ventricular rate of 65, left axis deviation, left bundle branch block and some nonspecific ST changes otherwise in anterior leads without other clear evidence of acute ischemia or significant arrhythmia. ____________________________________________  RADIOLOGY  ED MD interpretation: Chest x-ray shows no focal consolidation, effusion, edema, pneumothorax or other clear acute thoracic process.  Official radiology report(s): DG Chest 2 View  Result Date: 02/10/2021 CLINICAL DATA:  Chest pain EXAM: CHEST - 2 VIEW COMPARISON:  02/25/2020, 09/16/2019 FINDINGS: The heart size and  mediastinal contours are within normal limits. Chronic lingular scarring. No focal airspace consolidation, pleural effusion, or pneumothorax. The visualized skeletal structures are unremarkable. IMPRESSION: No active cardiopulmonary disease. Electronically Signed   By: Davina Poke D.O.   On: 02/10/2021 10:41    ____________________________________________   PROCEDURES  Procedure(s) performed (including Critical Care):  .1-3 Lead EKG Interpretation Performed by: Lucrezia Starch, MD Authorized by: Lucrezia Starch, MD     Interpretation: non-specific     ECG rate assessment: normal     Rhythm: sinus rhythm     Ectopy:  none     Conduction: normal     ____________________________________________   INITIAL IMPRESSION / ASSESSMENT AND PLAN / ED COURSE      Patient presents with above-stated history exam for assessment of left-sided chest pain and worse over the past week associate with nonproductive cough, congestion, sore throat and pressure in the left ear.  On arrival she is afebrile and hemodynamically stable.  On exam her she is some mild posterior oropharyngeal erythema but otherwise TMs are unremarkable bilaterally and there is no other evidence of deep space infection in the head or neck.  Her lungs are clear bilaterally and she has some mild tenderness along the left-sided costochondral joints.  I suspect likely a viral bronchitis and pharyngitis possibly some eustachian tube defect.  Patient does not appear volume overloaded and chest x-ray has no evidence of effusion or edema.  In addition there are no focal consolidations and given absence of fever or leukocytosis that would lower suspicion for bacterial infection.  Lower suspicion for PE given overall constellation of symptoms and absence of tachypnea, tachycardia or hypoxia or significant shortness of breath.  CBC shows stable hemoglobin at 11.1 compared to 10.26 months ago and Evalose patient for acute anemia causing patient's symptoms.  BMP shows no significant electrolyte or metabolic derangements.  COVID influenza PCR is negative  EKG and nonelevated troponin x2 are not suggestive of ACS or myocarditis.  I ambulated patient that she had no evidence of tachycardia, tachypnea or hypoxia and during COVID ambulation stated she felt good and wished to be discharged so she can go to the cafeteria.  Given absence of tachycardia or tachypnea or hypoxia or patient complaining of shortness of breath or worsening or chest pain ambulation I have low suspicion for PE at this time.  Given stable vitals with otherwise reassuring exam work-up I suspect likely  bronchitis and possible costochondritis versus chest wall inflammation I think she is stable for discharge with close outpatient follow-up.  Rx for Tessalon.  Discharged stable condition.  Strict return precautions advised and discussed.        ____________________________________________   FINAL CLINICAL IMPRESSION(S) / ED DIAGNOSES  Final diagnoses:  Chest pain, unspecified type  Bronchitis    Medications  acetaminophen (TYLENOL) tablet 1,000 mg (1,000 mg Oral Given 02/10/21 1235)     ED Discharge Orders          Ordered    benzonatate (TESSALON PERLES) 100 MG capsule  3 times daily PRN        02/10/21 1340             Note:  This document was prepared using Dragon voice recognition software and may include unintentional dictation errors.    Lucrezia Starch, MD 02/10/21 [phone]

## 2021-02-21 ENCOUNTER — Emergency Department: Payer: Medicare Other

## 2021-02-21 ENCOUNTER — Emergency Department
Admission: EM | Admit: 2021-02-21 | Discharge: 2021-02-21 | Disposition: A | Discharge status: Home / Self Care | Payer: Medicare Other | Attending: Emergency Medicine | Admitting: Emergency Medicine

## 2021-02-21 ENCOUNTER — Other Ambulatory Visit: Payer: Self-pay

## 2021-02-21 DIAGNOSIS — M542 Cervicalgia: Secondary | ICD-10-CM | POA: Insufficient documentation

## 2021-02-21 DIAGNOSIS — I509 Heart failure, unspecified: Secondary | ICD-10-CM | POA: Diagnosis not present

## 2021-02-21 DIAGNOSIS — Z87891 Personal history of nicotine dependence: Secondary | ICD-10-CM | POA: Insufficient documentation

## 2021-02-21 DIAGNOSIS — E119 Type 2 diabetes mellitus without complications: Secondary | ICD-10-CM | POA: Diagnosis not present

## 2021-02-21 DIAGNOSIS — Z7982 Long term (current) use of aspirin: Secondary | ICD-10-CM | POA: Insufficient documentation

## 2021-02-21 DIAGNOSIS — M546 Pain in thoracic spine: Secondary | ICD-10-CM | POA: Insufficient documentation

## 2021-02-21 DIAGNOSIS — R053 Chronic cough: Secondary | ICD-10-CM | POA: Diagnosis not present

## 2021-02-21 DIAGNOSIS — E876 Hypokalemia: Secondary | ICD-10-CM | POA: Insufficient documentation

## 2021-02-21 DIAGNOSIS — I700 Atherosclerosis of aorta: Secondary | ICD-10-CM | POA: Insufficient documentation

## 2021-02-21 DIAGNOSIS — J4520 Mild intermittent asthma, uncomplicated: Secondary | ICD-10-CM | POA: Diagnosis not present

## 2021-02-21 DIAGNOSIS — R0602 Shortness of breath: Secondary | ICD-10-CM | POA: Insufficient documentation

## 2021-02-21 DIAGNOSIS — Z20822 Contact with and (suspected) exposure to covid-19: Secondary | ICD-10-CM | POA: Diagnosis not present

## 2021-02-21 DIAGNOSIS — M549 Dorsalgia, unspecified: Secondary | ICD-10-CM

## 2021-02-21 DIAGNOSIS — I110 Hypertensive heart disease with heart failure: Secondary | ICD-10-CM | POA: Insufficient documentation

## 2021-02-21 DIAGNOSIS — E039 Hypothyroidism, unspecified: Secondary | ICD-10-CM | POA: Insufficient documentation

## 2021-02-21 DIAGNOSIS — Z7951 Long term (current) use of inhaled steroids: Secondary | ICD-10-CM | POA: Diagnosis not present

## 2021-02-21 DIAGNOSIS — Z79899 Other long term (current) drug therapy: Secondary | ICD-10-CM | POA: Diagnosis not present

## 2021-02-21 LAB — URINALYSIS, ROUTINE W REFLEX MICROSCOPIC
Bilirubin Urine: NEGATIVE
Glucose, UA: NEGATIVE mg/dL
Ketones, ur: NEGATIVE mg/dL
Leukocytes,Ua: NEGATIVE
Nitrite: NEGATIVE
Protein, ur: NEGATIVE mg/dL
Specific Gravity, Urine: 1.025 (ref 1.005–1.030)
pH: 5 (ref 5.0–8.0)

## 2021-02-21 LAB — CBC WITH DIFFERENTIAL/PLATELET
Abs Immature Granulocytes: 0.01 10*3/uL (ref 0.00–0.07)
Basophils Absolute: 0 10*3/uL (ref 0.0–0.1)
Basophils Relative: 0 %
Eosinophils Absolute: 0.2 10*3/uL (ref 0.0–0.5)
Eosinophils Relative: 3 %
HCT: 34 % — ABNORMAL LOW (ref 36.0–46.0)
Hemoglobin: 10.7 g/dL — ABNORMAL LOW (ref 12.0–15.0)
Immature Granulocytes: 0 %
Lymphocytes Relative: 45 %
Lymphs Abs: 2.4 10*3/uL (ref 0.7–4.0)
MCH: 24.1 pg — ABNORMAL LOW (ref 26.0–34.0)
MCHC: 31.5 g/dL (ref 30.0–36.0)
MCV: 76.6 fL — ABNORMAL LOW (ref 80.0–100.0)
Monocytes Absolute: 0.5 10*3/uL (ref 0.1–1.0)
Monocytes Relative: 9 %
Neutro Abs: 2.3 10*3/uL (ref 1.7–7.7)
Neutrophils Relative %: 43 %
Platelets: 188 10*3/uL (ref 150–400)
RBC: 4.44 MIL/uL (ref 3.87–5.11)
RDW: 15.6 % — ABNORMAL HIGH (ref 11.5–15.5)
WBC: 5.4 10*3/uL (ref 4.0–10.5)
nRBC: 0 % (ref 0.0–0.2)

## 2021-02-21 LAB — RESP PANEL BY RT-PCR (FLU A&B, COVID) ARPGX2
Influenza A by PCR: NEGATIVE
Influenza B by PCR: NEGATIVE
SARS Coronavirus 2 by RT PCR: NEGATIVE

## 2021-02-21 LAB — COMPREHENSIVE METABOLIC PANEL
ALT: 27 U/L (ref 0–44)
AST: 33 U/L (ref 15–41)
Albumin: 3.8 g/dL (ref 3.5–5.0)
Alkaline Phosphatase: 96 U/L (ref 38–126)
Anion gap: 5 (ref 5–15)
BUN: 39 mg/dL — ABNORMAL HIGH (ref 8–23)
CO2: 29 mmol/L (ref 22–32)
Calcium: 9.2 mg/dL (ref 8.9–10.3)
Chloride: 104 mmol/L (ref 98–111)
Creatinine, Ser: 0.87 mg/dL (ref 0.44–1.00)
GFR, Estimated: >60 mL/min (ref >60)
Glucose, Bld: 130 mg/dL — ABNORMAL HIGH (ref 70–99)
Potassium: 3.1 mmol/L — ABNORMAL LOW (ref 3.5–5.1)
Sodium: 138 mmol/L (ref 135–145)
Total Bilirubin: 0.6 mg/dL (ref 0.3–1.2)
Total Protein: 7.6 g/dL (ref 6.5–8.1)

## 2021-02-21 LAB — MAGNESIUM: Magnesium: 2.3 mg/dL (ref 1.7–2.4)

## 2021-02-21 LAB — LIPASE, BLOOD: Lipase: 30 U/L (ref 11–51)

## 2021-02-21 LAB — TROPONIN I (HIGH SENSITIVITY): Troponin I (High Sensitivity): 7 ng/L (ref <18)

## 2021-02-21 MED ORDER — ACETAMINOPHEN 500 MG PO TABS
1000.0000 mg | ORAL_TABLET | Freq: Once | ORAL | Status: AC
  Administered 2021-02-21: 1000 mg via ORAL
  Filled 2021-02-21: qty 2

## 2021-02-21 MED ORDER — POTASSIUM CHLORIDE CRYS ER 20 MEQ PO TBCR
40.0000 meq | EXTENDED_RELEASE_TABLET | Freq: Once | ORAL | Status: AC
  Administered 2021-02-21: 40 meq via ORAL
  Filled 2021-02-21: qty 2

## 2021-02-21 MED ORDER — LIDOCAINE 5 % EX PTCH
1.0000 | MEDICATED_PATCH | CUTANEOUS | Status: DC
  Administered 2021-02-21: 1 via TRANSDERMAL
  Filled 2021-02-21: qty 1

## 2021-02-21 MED ORDER — IOHEXOL 350 MG/ML SOLN
100.0000 mL | Freq: Once | INTRAVENOUS | Status: AC | PRN
  Administered 2021-02-21: 100 mL via INTRAVENOUS

## 2021-02-21 NOTE — ED Notes (Signed)
Pt ambulated to hall bathroom with steady gait.

## 2021-02-21 NOTE — ED Notes (Signed)
Dr Tamala Julian assessed pt again at bedside. Pt states that SOB has resolved. Walking SPO2 not needed anymore. Pt will discharge to home.

## 2021-02-21 NOTE — ED Notes (Signed)
EDP was just at bedside. 

## 2021-02-21 NOTE — ED Provider Notes (Signed)
[facility] Emergency Department Provider Note  ____________________________________________   Event Date/Time   First MD Initiated Contact with Patient 02/21/21 [id number]     (approximate)  I have reviewed the triage vital signs and the nursing notes.   HISTORY  Chief Complaint Back Pain (Pt c/o back pain all the way down her spine that started early yesterday that got worse when she went to bed last night. Pt reports it feels like a knife in her back.)   HPI Helen Mccormick is a 80 y.o. female with a past medical history of CHF, depression, GERD, HTN, HDL, OSA, anxiety, and thyroid disease who presents for assessment of some fairly diffuse lower neck and back pain that started last night when she bent over forward.  Patient denies any falls injuries or heavy lifting.  States he tried some Tylenol last night but has not taken any medicines today for this.  She states she has mild chronic cough and feels a little more short of breath today than usual.  No change in the chronic cough.  No hemoptysis.  She denies any chest pain, fevers, headache, earache, sore throat, abdominal pain, nausea, vomiting, diarrhea, burning with urination, rash or other acute sick symptoms.  No other acute concerns at this time.  She is not on any steroids and denies any history of malignancy.         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   Mild intermittent asthma 06/11/2020   Allergic rhinitis due to animal (cat) (dog) hair and dander 05/03/2020   Allergic rhinitis due to pollen 05/03/2020   Gastro-esophageal  reflux disease without esophagitis 05/03/2020   Nonischemic cardiomyopathy ([place]) 09/27/2019   Obstructive sleep apnea syndrome 12/15/2018   Microcytic anemia 06/29/2018   Gastritis 06/21/2018   Macular degeneration of both eyes 10/13/2017   Impingement syndrome of right shoulder region 09/10/2017   Chest pain of uncertain etiology 68/02/7516   Fracture of distal end of radius 06/03/2017   Hypertension 03/24/2017   DM (diabetes mellitus), type 2 ([place]) 03/24/2017   Hypothyroidism, 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   Hyperlipidemia due to type 2 diabetes mellitus ([place]) 03/24/2017   Cervical radiculitis 12/14/2013   DDD (degenerative disc disease), cervical 12/14/2013   Left shoulder pain 12/14/2013    Past Surgical History:  Procedure Laterality Date   ABDOMINAL HYSTERECTOMY     BACK SURGERY     BACK SURGERY     CERVICAL FUSION     COLONOSCOPY WITH PROPOFOL N/A 05/28/2017   Procedure: COLONOSCOPY WITH PROPOFOL;  Surgeon: Jonathon Bellows, MD;  Location: [facility] ENDOSCOPY;  Service: Gastroenterology;  Laterality: N/A;   COLONOSCOPY WITH PROPOFOL N/A 11/17/2019   Procedure: COLONOSCOPY WITH PROPOFOL;  Surgeon: Lesly Rubenstein, MD;  Location: [facility] ENDOSCOPY;  Service: Endoscopy;  Laterality: N/A;   ESOPHAGOGASTRODUODENOSCOPY (EGD) WITH PROPOFOL N/A 11/17/2019   Procedure: ESOPHAGOGASTRODUODENOSCOPY (EGD) WITH PROPOFOL;  Surgeon: Lesly Rubenstein, MD;  Location: [facility] ENDOSCOPY;  Service: Endoscopy;  Laterality: N/A;   NECK SURGERY  THYROID SURGERY     THYROID SURGERY      Prior to Admission medications   Medication Sig Start Date End Date Taking? Authorizing Provider  acetaminophen (TYLENOL) 325 MG tablet Take 2 tablets (650 mg total) by mouth every 6 (six) hours as needed. 02/25/20   Carrie Mew, MD  albuterol (PROVENTIL HFA;VENTOLIN HFA) 108 (90 Base) MCG/ACT inhaler Inhale into the lungs every 6 (six) hours as needed for wheezing or shortness of breath.    [provider]   amLODipine (NORVASC) 5 MG tablet TAKE 1 TABLET(5 MG) BY MOUTH DAILY FOR BLOOD PRESSURE 02/06/19   Parks Ranger, Devonne Doughty, DO  aspirin 81 MG chewable tablet Chew 81 mg by mouth daily.     [provider]  atenolol (TENORMIN) 50 MG tablet Take 1 tablet by mouth daily.    [provider]  atorvastatin (LIPITOR) 20 MG tablet Take 1 tablet by mouth daily.    [provider]  Azelastine HCl 137 MCG/SPRAY SOLN SMARTSIG:1-2 Spray(s) Both Nares Twice Daily 07/24/19   [provider]  Baclofen 5 MG TABS Take by mouth at bedtime. 11/24/19   [provider]  betamethasone dipropionate 0.05 % cream Apply topically.    [provider]  Blood Glucose Monitoring Suppl (ACCU-CHEK GUIDE) w/Device KIT See admin instructions. 11/28/20   [provider]  calcium carbonate (OS-CAL) 600 MG TABS tablet Take 600 mg by mouth 2 (two) times daily with a meal.    [provider]  carvedilol (COREG) 12.5 MG tablet Take 12.5 mg by mouth 2 (two) times daily. 12/16/20   [provider]  cefdinir (OMNICEF) 300 MG capsule Take by mouth. 10/14/20   [provider]  cetirizine (ZYRTEC) 10 MG tablet Take 1 tablet by mouth daily.    [provider]  Cysteamine Bitartrate (PROCYSBI) 300 MG PACK as directed Whichever is preferred by insurance 11/28/20 11/28/21  [provider]  dicyclomine (BENTYL) 10 MG capsule Take 1 capsule by mouth 3 (three) times daily as needed.    [provider]  ENTRESTO 24-26 MG Take 1 tablet by mouth 2 (two) times daily. 11/28/19   Loel Dubonnet, NP  ergocalciferol (VITAMIN D2) 1.25 MG (50000 UT) capsule ergocalciferol (vitamin D2) 1,250 mcg (50,000 unit) capsule  TK 1 C PO 1 TIME A WK    [provider]  escitalopram (LEXAPRO) 10 MG tablet TAKE 1 TABLET BY MOUTH EVERY DAY FOR DEPRESSION 04/25/19   Parks Ranger, Devonne Doughty, DO  famotidine (PEPCID) 20 MG tablet Take 1 tablet (20 mg total) by  mouth 2 (two) times daily. 02/25/20   Carrie Mew, MD  Flaxseed, Linseed, (FLAX SEED OIL) 1000 MG CAPS Take by mouth.    [provider]  FLOVENT HFA 44 MCG/ACT inhaler SMARTSIG:2 Puff(s) By Mouth Twice Daily 09/23/19   [provider]  fluticasone (FLONASE) 50 MCG/ACT nasal spray Place 2 sprays into both nostrils daily.    [provider]  Garlic 1610 MG CAPS Take by mouth daily. Patient not taking: No sig reported    [provider]  glucose blood test strip Use 1 strip to check your blood sugar two times daily. 06/21/18   [name], NP  hydrochlorothiazide (HYDRODIURIL) 12.5 MG tablet TAKE 1 TABLET(12.5 MG) BY MOUTH DAILY 04/25/19   Parks Ranger, Devonne Doughty, DO  isosorbide mononitrate (IMDUR) 30 MG 24 hr tablet TAKE 1 TABLET(30 MG) BY MOUTH DAILY 07/24/18   Parks Ranger, Devonne Doughty, DO  levothyroxine (SYNTHROID) 75  MCG tablet Take by mouth. 11/26/20   [provider]  levothyroxine (SYNTHROID, LEVOTHROID) 75 MCG tablet TAKE 1 TABLET BY MOUTH DAILY BEFORE BREAKFAST FOR THYROID 06/21/18   [name], NP  meloxicam (MOBIC) 15 MG tablet meloxicam 15 mg tablet  Take 1 tablet every day by oral route.    [provider]  metoprolol succinate (TOPROL-XL) 25 MG 24 hr tablet TAKE 1 TABLET(25 MG) BY MOUTH DAILY 07/05/20   End, Harrell Gave, MD  Multiple Vitamins-Minerals (PRESERVISION AREDS 2 PO) Take by mouth.    [provider]  nitroGLYCERIN (NITROSTAT) 0.4 MG SL tablet Place 1 tablet (0.4 mg total) under the tongue every 5 (five) minutes as needed for chest pain. 08/20/17   [name], NP  Omega-3 1000 MG CAPS Take 1 capsule by mouth daily.     [provider]  omeprazole (PRILOSEC) 40 MG capsule Take 1 capsule by mouth daily.    [provider]  pantoprazole (PROTONIX) 20 MG tablet Take 1 tablet (20 mg total) by mouth daily. FOR ACID REFLUX AND STOMACH PAIN 06/21/18 11/28/19  [name], NP  pantoprazole (PROTONIX) 40 MG tablet Take by mouth. 11/26/20   [provider]  polyethylene glycol powder (GLYCOLAX/MIRALAX) 17 GM/SCOOP powder polyethylene glycol 3350 17 gram/dose oral powder  TK  PO ONCE FOR 1 DOSE    [provider]  rosuvastatin (CRESTOR) 40 MG tablet Take 40 mg by mouth daily. 12/16/20   [provider]  tiZANidine (ZANAFLEX) 2 MG tablet Take 2 mg by mouth 3 (three) times daily. 01/16/21   [provider]  traMADol (ULTRAM) 50 MG tablet Take 1 tablet by mouth every 6 (six) hours as needed.    [provider]    Allergies Amlodipine, Tramadol, Codeine, and Etodolac  Family History  Problem Relation Age of Onset   Breast cancer Maternal Aunt 6   Diabetes Mother    Leukemia Mother    Heart Problems Mother    Diabetes Sister    Mental illness Sister    Colon polyps Sister    Stroke Father    Cancer Brother        rectal   Healthy Daughter    Hypertension Son    Diabetes Sister    Thyroid disease Sister    Healthy Sister    Healthy Sister    Stroke Brother    Healthy Son    Healthy Son    Healthy Daughter     Social History Social History   Tobacco Use   Smoking status: Never   Smokeless tobacco: Former    Types: Nurse, children's Use: Never used  Substance Use Topics   Alcohol use: No   Drug use: No    Review of Systems  Review of Systems  Constitutional:  Negative for chills and fever.  HENT:  Negative for sore throat.   Eyes:  Negative for pain.  Respiratory:  Negative for cough and stridor.   Cardiovascular:  Negative for chest pain.  Gastrointestinal:  Negative for vomiting.  Genitourinary:  Negative for dysuria.  Musculoskeletal:  Positive for back pain and neck pain.  Skin:  Negative for rash.  Neurological:  Negative for seizures, loss of consciousness and headaches.  Psychiatric/Behavioral:  Negative for suicidal ideas.   All other systems reviewed and are  negative.    ____________________________________________   PHYSICAL EXAM:  VITAL SIGNS: ED Triage Vitals  Enc Vitals Group  BP 02/21/21 0515 (!) 132/57     Pulse Rate 02/21/21 0515 71     Resp 02/21/21 0515 16     Temp 02/21/21 0515 98.2 F (36.8 C)     Temp Source 02/21/21 0515 Oral     SpO2 02/21/21 0515 93 %     Weight 02/21/21 0516 164 lb 14.5 oz (74.8 kg)     Height 02/21/21 0516 5' 6" (1.676 m)     Head Circumference --      Peak Flow --      Pain Score 02/21/21 0516 10     Pain Loc --      Pain Edu? --      Excl. in [place]? --    Vitals:   02/21/21 0515 02/21/21 1115  BP: (!) 132/57 (!) 120/46  Pulse: 71 68  Resp: 16 16  Temp: 98.2 F (36.8 C)   SpO2: 93% 93%   Physical Exam Vitals and nursing note reviewed.  Constitutional:      General: She is not in acute distress.    Appearance: She is well-developed.  HENT:     Head: Normocephalic and atraumatic.     Right Ear: External ear normal.     Left Ear: External ear normal.     Nose: Nose normal.  Eyes:     Conjunctiva/sclera: Conjunctivae normal.  Cardiovascular:     Rate and Rhythm: Normal rate and regular rhythm.     Heart sounds: No murmur heard. Pulmonary:     Effort: Pulmonary effort is normal. No respiratory distress.     Breath sounds: Normal breath sounds.  Abdominal:     Palpations: Abdomen is soft.     Tenderness: There is no abdominal tenderness.  Musculoskeletal:        General: No swelling.     Cervical back: Neck supple.  Skin:    General: Skin is warm and dry.     Capillary Refill: Capillary refill takes less than 2 seconds.  Neurological:     Mental Status: She is alert.  Psychiatric:        Mood and Affect: Mood normal.    There are no overlying skin changes of the patient's back.  No specific point areas of tenderness although pain does seem exacerbated by forward flexion at the neck.  No CVA tenderness.  Cranial nerves II through XII are grossly intact.  Patient has full  strength in her bilateral upper and lower extremities.  Sensation is intact to light touch lower extremities.  2+ radial pulse. ____________________________________________   LABS (all labs ordered are listed, but only abnormal results are displayed)  Labs Reviewed  CBC WITH DIFFERENTIAL/PLATELET - Abnormal; Notable for the following components:      Result Value   Hemoglobin 10.7 (*)    HCT 34.0 (*)    MCV 76.6 (*)    MCH 24.1 (*)    RDW 15.6 (*)    All other components within normal limits  COMPREHENSIVE METABOLIC PANEL - Abnormal; Notable for the following components:   Potassium 3.1 (*)    Glucose, Bld 130 (*)    BUN 39 (*)    All other components within normal limits  URINALYSIS, ROUTINE W REFLEX MICROSCOPIC - Abnormal; Notable for the following components:   Color, Urine YELLOW (*)    APPearance CLEAR (*)    Hgb urine dipstick TRACE (*)    Bacteria, UA RARE (*)    All other components within normal limits  RESP  PANEL BY RT-PCR (FLU A&B, COVID) ARPGX2  LIPASE, BLOOD  MAGNESIUM  TROPONIN I (HIGH SENSITIVITY)   ____________________________________________  EKG  ECG remarkable sinus rhythm with a ventricular rate of 72, left axis deviation and left bundle branch block with some nonspecific ST changes in anterior leads without other clear evidence of acute ischemia or significant arrhythmia.  This EKG is largely unchanged from obtained on 11/14. ____________________________________________  RADIOLOGY  ED MD interpretation: Chest x-ray without evidence of a focal consolidation, overt edema, pneumothorax, effusion, or fracture or other acute thoracic process.  CT of the C/T/L-spine showed no acute fracture or dislocation.  Patient is noted to have fairly advanced diffuse degenerative disease and several areas of disc base height loss and osteophytosis and some sclerosis.  CTA chest shows no evidence of PE, pneumonia, overt edema, effusion, pneumothorax or other clear acute  thoracic process.  There is evidence of aortic atherosclerosis.  Official radiology report(s): DG Chest 2 View  Result Date: 02/21/2021 CLINICAL DATA:  80 year old female with history of back pain. EXAM: CHEST - 2 VIEW COMPARISON:  Chest x-ray 02/10/2021. FINDINGS: Lung volumes are low. Bibasilar opacities which may reflect areas of atelectasis and/or consolidation. No pleural effusions. No pneumothorax. No pulmonary nodule or mass noted. Pulmonary vasculature and the cardiomediastinal silhouette are within normal limits. Atherosclerotic calcifications in the thoracic aorta. IMPRESSION: 1. Low lung volumes with bibasilar opacities which may represent areas of atelectasis and/or airspace consolidation. 2. Aortic atherosclerosis. Electronically Signed   By: Vinnie Langton M.D.   On: 02/21/2021 06:02   CT Angio Chest PE W and/or Wo Contrast  Result Date: 02/21/2021 CLINICAL DATA:  PE suspected, back pain EXAM: CT ANGIOGRAPHY CHEST WITH CONTRAST TECHNIQUE: Multidetector CT imaging of the chest was performed using the standard protocol during bolus administration of intravenous contrast. Multiplanar CT image reconstructions and MIPs were obtained to evaluate the vascular anatomy. CONTRAST:  145m OMNIPAQUE IOHEXOL 350 MG/ML SOLN COMPARISON:  CT angiogram chest abdomen pelvis, 09/16/2019 FINDINGS: Cardiovascular: Satisfactory opacification of the pulmonary arteries to the segmental level. No evidence of pulmonary embolism. Mild cardiomegaly. No pericardial effusion. Aortic atherosclerosis. Mediastinum/Nodes: No enlarged mediastinal, hilar, or axillary lymph nodes. Thyroid gland, trachea, and esophagus demonstrate no significant findings. Lungs/Pleura: Bandlike scarring of the lung bases. No pleural effusion or pneumothorax. Upper Abdomen: No acute abnormality. Musculoskeletal: No chest wall abnormality. No acute or significant osseous findings. Review of the MIP images confirms the above findings. IMPRESSION:  1. Negative examination for pulmonary embolism. 2. Cardiomegaly. Aortic Atherosclerosis (ICD10-I70.0). Electronically Signed   By: ADelanna AhmadiM.D.   On: 02/21/2021 12:32   CT Cervical Spine Wo Contrast  Result Date: 02/21/2021 CLINICAL DATA:  Severe back pain since yesterday, no injury EXAM: CT CERVICAL, THORACIC, AND LUMBAR SPINE WITHOUT CONTRAST TECHNIQUE: Multidetector CT imaging of the cervical, thoracic, and lumbar spine was performed without intravenous contrast. Multiplanar CT image reconstructions were also generated. COMPARISON:  MR lumbar spine, 05/08/2020, CT angiogram chest abdomen pelvis, 09/16/2019 FINDINGS: Alignment: Straightening of the normal cervical lordosis. Normal thoracic kyphosis. Normal lumbar lordosis. Skull base and vertebrae: No evidence of fracture. No suspicious osseous lesion. Soft tissues and spinal canal: Unremarkable by noncontrast CT. Disc levels: Status post anterior cervical discectomy and fusion of C3-C4 with bony incorporation of the disc space. There is otherwise mild to moderate multilevel disc space height loss and osteophytosis, worst at C5-C6. Focally severe disc space height loss and osteophytosis with endplate sclerosis at T6 through T8, with otherwise minimal multilevel disc  space height loss and endplate osteophytosis. Focally moderate disc space height loss and osteophytosis with vacuum disc phenomenon at L5-S1, with otherwise intact lumbar disc spaces. Other: Aortic atherosclerosis. IMPRESSION: 1. No fracture or dislocation of the cervical, thoracic, or lumbar spine. 2. 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 There is otherwise mild to moderate multilevel disc space height loss and osteophytosis, worst at C5-C6. 3. Focally severe disc space height loss and osteophytosis with endplate sclerosis at T6 through T8, with otherwise minimal multilevel disc space height loss and endplate osteophytosis of  the thoracic spine. 4. Focally moderate disc space height loss and osteophytosis at L5-S1, with otherwise intact lumbar disc spaces. 5. Chronic postoperative and degenerative findings are unchanged compared to prior examinations. MRI may be used to further evaluate spinal disc and neural foraminal pathology if indicated by neurologically localizing signs and symptoms. Aortic Atherosclerosis (ICD10-I70.0). Electronically Signed   By: Delanna Ahmadi M.D.   On: 02/21/2021 12:28   CT Lumbar Spine Wo Contrast  Result Date: 02/21/2021 CLINICAL DATA:  Severe back pain since yesterday, no injury EXAM: CT CERVICAL, THORACIC, AND LUMBAR SPINE WITHOUT CONTRAST TECHNIQUE: Multidetector CT imaging of the cervical, thoracic, and lumbar spine was performed without intravenous contrast. Multiplanar CT image reconstructions were also generated. COMPARISON:  MR lumbar spine, 05/08/2020, CT angiogram chest abdomen pelvis, 09/16/2019 FINDINGS: Alignment: Straightening of the normal cervical lordosis. Normal thoracic kyphosis. Normal lumbar lordosis. Skull base and vertebrae: No evidence of fracture. No suspicious osseous lesion. Soft tissues and spinal canal: Unremarkable by noncontrast CT. Disc levels: Status post anterior cervical discectomy and fusion of C3-C4 with bony incorporation of the disc space. There is otherwise mild to moderate multilevel disc space height loss and osteophytosis, worst at C5-C6. Focally severe disc space height loss and osteophytosis with endplate sclerosis at T6 through T8, with otherwise minimal multilevel disc space height loss and endplate osteophytosis. Focally moderate disc space height loss and osteophytosis with vacuum disc phenomenon at L5-S1, with otherwise intact lumbar disc spaces. Other: Aortic atherosclerosis. IMPRESSION: 1. No fracture or dislocation of the cervical, thoracic, or lumbar spine. 2. Status post anterior cervical discectomy and fusion of C3-C4 with bony incorporation of the  disc space. There is otherwise mild to moderate multilevel disc space height loss and osteophytosis, worst at C5-C6. 3. Focally severe disc space height loss and osteophytosis with endplate sclerosis at T6 through T8, with otherwise minimal multilevel disc space height loss and endplate osteophytosis of the thoracic spine. 4. Focally moderate disc space height loss and osteophytosis at L5-S1, with otherwise intact lumbar disc spaces. 5. Chronic postoperative and degenerative findings are unchanged compared to prior examinations. MRI may be used to further evaluate spinal disc and neural foraminal pathology if indicated by neurologically localizing signs and symptoms. Aortic Atherosclerosis (ICD10-I70.0). Electronically Signed   By: Delanna Ahmadi M.D.   On: 02/21/2021 12:28   CT T-SPINE NO CHARGE  Result Date: 02/21/2021 CLINICAL DATA:  Severe back pain since yesterday, no injury EXAM: CT CERVICAL, THORACIC, AND LUMBAR SPINE WITHOUT CONTRAST TECHNIQUE: Multidetector CT imaging of the cervical, thoracic, and lumbar spine was performed without intravenous contrast. Multiplanar CT image reconstructions were also generated. COMPARISON:  MR lumbar spine, 05/08/2020, CT angiogram chest abdomen pelvis, 09/16/2019 FINDINGS: Alignment: Straightening of the normal cervical lordosis. Normal thoracic kyphosis. Normal lumbar lordosis. Skull base and vertebrae: No evidence of fracture. No suspicious osseous lesion. Soft tissues and spinal canal: Unremarkable by noncontrast  CT. Disc levels: Status post anterior cervical discectomy and fusion of C3-C4 with bony incorporation of the disc space. There is otherwise mild to moderate multilevel disc space height loss and osteophytosis, worst at C5-C6. Focally severe disc space height loss and osteophytosis with endplate sclerosis at T6 through T8, with otherwise minimal multilevel disc space height loss and endplate osteophytosis. Focally moderate disc space height loss and  osteophytosis with vacuum disc phenomenon at L5-S1, with otherwise intact lumbar disc spaces. Other: Aortic atherosclerosis. IMPRESSION: 1. No fracture or dislocation of the cervical, thoracic, or lumbar spine. 2. 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 There is otherwise mild to moderate multilevel disc space height loss and osteophytosis, worst at C5-C6. 3. Focally severe disc space height loss and osteophytosis with endplate sclerosis at T6 through T8, with otherwise minimal multilevel disc space height loss and endplate osteophytosis of the thoracic spine. 4. Focally moderate disc space height loss and osteophytosis at L5-S1, with otherwise intact lumbar disc spaces. 5. Chronic postoperative and degenerative findings are unchanged compared to prior examinations. MRI may be used to further evaluate spinal disc and neural foraminal pathology if indicated by neurologically localizing signs and symptoms. Aortic Atherosclerosis (ICD10-I70.0). Electronically Signed   By: Delanna Ahmadi M.D.   On: 02/21/2021 12:28    ____________________________________________   PROCEDURES  Procedure(s) performed (including Critical Care):  Procedures   ____________________________________________   INITIAL IMPRESSION / ASSESSMENT AND PLAN / ED COURSE      Patient presents with above-stated history and exam for assessment of some fairly diffuse back pain that started yesterday when she bent forward.  On arrival she is afebrile and hemodynamically stable.  She has a nonfocal extremity neurological exam.  She is denying any incontinence.  She has full sensation to all extremities.  There is no point tenderness of her back and the skin is unremarkable.  Pain is exacerbated by forward flexion.  She states she had some shortness of breath but is not sure if it is related to the pain or not.  Differential includes possible atypical presentation for ACS, PE,  herniated disc with some nerve impingement, pneumonia, pneumothorax, pathological fracture, spinal stenosis and possible radiculopathy.  Patient has [place] of trouble describing exactly where the pain seems to be originating from and at times stating it is more in her neck versus in the lower back.   Chest x-ray without evidence of a focal consolidation, overt edema, pneumothorax, effusion, or fracture or other acute thoracic process.   CT of the C/T/L-spine showed no acute fracture or dislocation.  Patient is noted to have fairly advanced diffuse degenerative disease and several areas of disc base height loss and osteophytosis and some sclerosis.  CTA chest shows no evidence of PE, pneumonia, overt edema, effusion, pneumothorax or other clear acute thoracic process.  There is evidence of aortic atherosclerosis.  ECG remarkable sinus rhythm with a ventricular rate of 72, left axis deviation and left bundle branch block with some nonspecific ST changes in anterior leads without other clear evidence of acute ischemia or significant arrhythmia.  This EKG is largely unchanged from obtained on 11/14.  However given nonelevated troponin on the low sufficient for ACS or myocarditis.  Lipase not consistent with pancreatitis.  CMP not suggestive of significant electrolyte metabolic derangements aside from K of 3.1.  This was repleted.  Magnesium is within normal months.  COVID influenza PCR is negative.  UA is not suggestive of  cystitis.  Patient given some Tylenol lidocaine patch and on reassessment stated she was feeling much better.  In addition she had resolution of her shortness of breath seems to be more related to pain in her back and given absence of any ongoing shortness of breath after improvement of back pain.  Patient was able to ambulate unassisted and given resolution of symptoms with Tylenol lidocaine patch with otherwise reassuring CTs and exam I have low suspicion  for dissection or other  immediate life-threatening process.  I suspect a component of acute on chronic pain from [place] arthritic changes and degeneration of the spine.  It is possible she is impinging on some nerves as well.  Somewhat difficult to localize specific nerve on exam although I think she is stable for discharge with outpatient follow-up   ____________________________________________   FINAL CLINICAL IMPRESSION(S) / ED DIAGNOSES  Final diagnoses:  Acute midline back pain, unspecified back location  Hypokalemia  Aortic atherosclerosis (HCC)    Medications  lidocaine (LIDODERM) 5 % 1 patch (1 patch Transdermal Patch Applied 02/21/21 1058)  potassium chloride SA (KLOR-CON) CR tablet 40 mEq (40 mEq Oral Given 02/21/21 1057)  acetaminophen (TYLENOL) tablet 1,000 mg (1,000 mg Oral Given 02/21/21 1056)  iohexol (OMNIPAQUE) 350 MG/ML injection 100 mL (100 mLs Intravenous Contrast Given 02/21/21 1148)     ED Discharge Orders     None        Note:  This document was prepared using Dragon voice recognition software and may include unintentional dictation errors.    Lucrezia Starch, MD 02/21/21 [phone]

## 2021-02-21 NOTE — ED Notes (Signed)
Paper discharge consent signed.

## 2021-02-21 NOTE — ED Provider Notes (Signed)
Emergency Medicine Provider Triage Evaluation Note  Helen Mccormick , a 80 y.o. female  was evaluated in triage.  Pt complains of back pain, all down the spine.  Review of Systems  Positive: Back pain Negative: Cough, vomiting  Physical Exam  LMP  (LMP Unknown)  Gen:   Awake, no distress   Resp:  Normal effort  MSK:   Moves extremities without difficulty  Other:    Medical Decision Making  Medically screening exam initiated at 5:14 AM.  Appropriate orders placed.  Burnard Leigh Salih was informed that the remainder of the evaluation will be completed by another provider, this initial triage assessment does not replace that evaluation, and the importance of remaining in the ED until their evaluation is complete.  80y./o F here for generalized back pain. Will obtain labwork, UA while patient awaits treatment room.   Paulette Blanch, MD 02/21/21 [phone]

## 2021-02-21 NOTE — ED Triage Notes (Signed)
Pt c/o back pain all the way down her spine that started early yesterday that got worse when she went to bed last night. Pt reports it feels like a knife in her back.

## 2021-02-21 NOTE — Discharge Instructions (Signed)
Your back CT today showed: 1. No fracture or dislocation of the cervical, thoracic, or lumbar spine.   2. 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 There is otherwise mild to moderate multilevel disc space height loss and osteophytosis, worst at C5-C6.   3. Focally severe disc space height loss and osteophytosis with endplate sclerosis at T6 through T8, with otherwise minimal multilevel disc space height loss and endplate osteophytosis of the thoracic spine.   4. Focally moderate disc space height loss and osteophytosis at L5-S1, with otherwise intact lumbar disc spaces.   5. Chronic postoperative and degenerative findings are unchanged compared to prior examinations. MRI may be used to further evaluate spinal disc and neural foraminal pathology if indicated by neurologically localizing signs and symptoms.   Aortic Atherosclerosis (ICD10-I70.0).

## 2021-02-21 NOTE — ED Notes (Signed)
Pt c/o sharp back pain that started yesterday and started in lower back and moved to upper back. Pt takes "gas pills" at home. Pt accompanied by family member. Description of pain is somewhat vague but states that pain is sharp "like a knife".

## 2021-02-27 ENCOUNTER — Ambulatory Visit: Payer: Medicare Other

## 2021-03-03 ENCOUNTER — Other Ambulatory Visit: Payer: Self-pay | Admitting: Internal Medicine

## 2021-03-03 DIAGNOSIS — Z1231 Encounter for screening mammogram for malignant neoplasm of breast: Secondary | ICD-10-CM

## 2021-03-13 ENCOUNTER — Ambulatory Visit: Payer: Medicare Other

## 2021-05-09 ENCOUNTER — Emergency Department
Admission: EM | Admit: 2021-05-09 | Discharge: 2021-05-09 | Disposition: A | Discharge status: Home / Self Care | Payer: Medicare Other | Attending: Emergency Medicine | Admitting: Emergency Medicine

## 2021-05-09 ENCOUNTER — Encounter: Payer: Self-pay | Admitting: Emergency Medicine

## 2021-05-09 ENCOUNTER — Emergency Department: Payer: Medicare Other

## 2021-05-09 ENCOUNTER — Other Ambulatory Visit: Payer: Self-pay

## 2021-05-09 DIAGNOSIS — E039 Hypothyroidism, unspecified: Secondary | ICD-10-CM | POA: Diagnosis not present

## 2021-05-09 DIAGNOSIS — R1030 Lower abdominal pain, unspecified: Secondary | ICD-10-CM | POA: Diagnosis not present

## 2021-05-09 DIAGNOSIS — E119 Type 2 diabetes mellitus without complications: Secondary | ICD-10-CM | POA: Diagnosis not present

## 2021-05-09 DIAGNOSIS — R109 Unspecified abdominal pain: Secondary | ICD-10-CM | POA: Diagnosis present

## 2021-05-09 LAB — CBC
HCT: 37 % (ref 36.0–46.0)
Hemoglobin: 11.4 g/dL — ABNORMAL LOW (ref 12.0–15.0)
MCH: 23.8 pg — ABNORMAL LOW (ref 26.0–34.0)
MCHC: 30.8 g/dL (ref 30.0–36.0)
MCV: 77.4 fL — ABNORMAL LOW (ref 80.0–100.0)
Platelets: 225 10*3/uL (ref 150–400)
RBC: 4.78 MIL/uL (ref 3.87–5.11)
RDW: 16.8 % — ABNORMAL HIGH (ref 11.5–15.5)
WBC: 5.7 10*3/uL (ref 4.0–10.5)
nRBC: 0 % (ref 0.0–0.2)

## 2021-05-09 LAB — COMPREHENSIVE METABOLIC PANEL
ALT: 21 U/L (ref 0–44)
AST: 28 U/L (ref 15–41)
Albumin: 3.9 g/dL (ref 3.5–5.0)
Alkaline Phosphatase: 87 U/L (ref 38–126)
Anion gap: 10 (ref 5–15)
BUN: 20 mg/dL (ref 8–23)
CO2: 29 mmol/L (ref 22–32)
Calcium: 9.4 mg/dL (ref 8.9–10.3)
Chloride: 102 mmol/L (ref 98–111)
Creatinine, Ser: 0.99 mg/dL (ref 0.44–1.00)
GFR, Estimated: 58 mL/min — ABNORMAL LOW (ref >60)
Glucose, Bld: 113 mg/dL — ABNORMAL HIGH (ref 70–99)
Potassium: 3.5 mmol/L (ref 3.5–5.1)
Sodium: 141 mmol/L (ref 135–145)
Total Bilirubin: 0.6 mg/dL (ref 0.3–1.2)
Total Protein: 7.6 g/dL (ref 6.5–8.1)

## 2021-05-09 LAB — URINALYSIS, ROUTINE W REFLEX MICROSCOPIC
Bilirubin Urine: NEGATIVE
Glucose, UA: NEGATIVE mg/dL
Hgb urine dipstick: NEGATIVE
Ketones, ur: NEGATIVE mg/dL
Leukocytes,Ua: NEGATIVE
Nitrite: NEGATIVE
Protein, ur: NEGATIVE mg/dL
Specific Gravity, Urine: 1.021 (ref 1.005–1.030)
pH: 5 (ref 5.0–8.0)

## 2021-05-09 LAB — LIPASE, BLOOD: Lipase: 27 U/L (ref 11–51)

## 2021-05-09 MED ORDER — IOHEXOL 350 MG/ML SOLN
80.0000 mL | Freq: Once | INTRAVENOUS | Status: AC | PRN
  Administered 2021-05-09: 80 mL via INTRAVENOUS
  Filled 2021-05-09: qty 80

## 2021-05-09 MED ORDER — HYOSCYAMINE SULFATE SL 0.125 MG SL SUBL: 0.1250 mg | SUBLINGUAL_TABLET | Freq: Four times a day (QID) | SUBLINGUAL | 0 refills | Status: DC | PRN

## 2021-05-09 NOTE — ED Notes (Signed)
Pt not able to urinate at this moment. Will place urine order once pt can urinate. Pt given specimen cup.

## 2021-05-09 NOTE — ED Triage Notes (Signed)
Pt here with abd pain x1 week that got worse last night. Pt states pain is lower that radiates to her back, Pt denies N/V but has diarrhea. Pt in NAD in triage.

## 2021-05-09 NOTE — ED Notes (Signed)
First Nurse Note:  Pt to ED via POV c/o abdominal pain. Pt is in NAD.

## 2021-05-09 NOTE — ED Notes (Signed)
Pt verbalized understanding of discharge instructions, medication prescriptions, and follow-up care instructions. Pt advised if symptoms worsen or return to come back to ED.

## 2021-05-09 NOTE — Discharge Instructions (Addendum)
Please follow-up with your primary care provider.  Return to the emergency department for symptoms of change or worsen if you are unable to schedule appointment.

## 2021-05-09 NOTE — ED Provider Notes (Signed)
[facility] Provider Note    Event Date/Time   First MD Initiated Contact with Patient 05/09/21 1521     (approximate)   History   Abdominal Pain   HPI  Helen Mccormick is a 81 y.o. female with history of diabetes, hypothyroidism, hyperlipidemia, anemia, and as listed in EMR presents to the emergency department for treatment and evaluation of abdominal pain for the past week.  Symptoms worsened last night.  No nausea or vomiting but has had several episodes of diarrhea. No blood noted. No fever.      Physical Exam   Triage Vital Signs: ED Triage Vitals [05/09/21 1412]  Enc Vitals Group     BP (!) 149/72     Pulse Rate 62     Resp 16     Temp 97.8 F (36.6 C)     Temp Source Oral     SpO2 94 %     Weight 160 lb (72.6 kg)     Height 5\' 4"  (1.626 m)     Head Circumference      Peak Flow      Pain Score 10     Pain Loc      Pain Edu?      Excl. in [place]?     Most recent vital signs: Vitals:   05/09/21 1412 05/09/21 1551  BP: (!) 149/72 (!) 134/58  Pulse: 62 63  Resp: 16 16  Temp: 97.8 F (36.6 C)   SpO2: 94% 99%    General: Awake, no distress.  CV:  Good peripheral perfusion.  Resp:  Normal effort.  Abd:  No distention.  Diffuse, bilateral lower abdominal tenderness.  No guarding or rebound. Other:     ED Results / Procedures / Treatments   Labs (all labs ordered are listed, but only abnormal results are displayed) Labs Reviewed  COMPREHENSIVE METABOLIC PANEL - Abnormal; Notable for the following components:      Result Value   Glucose, Bld 113 (*)    GFR, Estimated 58 (*)    All other components within normal limits  CBC - Abnormal; Notable for the following components:   Hemoglobin 11.4 (*)    MCV 77.4 (*)    MCH 23.8 (*)    RDW 16.8 (*)    All other components within normal limits  URINALYSIS, ROUTINE W REFLEX MICROSCOPIC - Abnormal; Notable for the following components:   Color, Urine YELLOW (*)    APPearance HAZY  (*)    All other components within normal limits  LIPASE, BLOOD     EKG     RADIOLOGY  Image and radiology report reviewed by me.  CT of the abdomen and pelvis negative for any acute concerns.  PROCEDURES:  Critical Care performed: No  Procedures   MEDICATIONS ORDERED IN ED: Medications  iohexol (OMNIPAQUE) 350 MG/ML injection 80 mL (80 mLs Intravenous Contrast Given 05/09/21 1556)     IMPRESSION / MDM / ASSESSMENT AND PLAN / ED COURSE   I have reviewed the triage note.  Differential diagnosis includes, but is not limited to, colitis, diverticulitis, enteritis, viral syndrome, acute cystitis  81 year old female presenting to the emergency department for treatment and evaluation of lower abdominal pain starting approximately 1 week ago with worsening yesterday.  See HPI for further details.  Exam is overall reassuring.  Labs drawn while awaiting ER room assignment are also reassuring.  No leukocytosis, stable anemia, no electrolyte abnormalities, lipase is normal.  Urinalysis is still  pending.  Urinalysis is normal.  CT of the abdomen and pelvis does not show any acute concerns.  Results were discussed with the patient who feels reassured.  She will be prescribed Levsin and advised to schedule follow-up appointment with her primary care provider.  If symptoms change or worsen and she is unable to see them right away she is to return to the emergency department.      FINAL CLINICAL IMPRESSION(S) / ED DIAGNOSES   Final diagnoses:  Lower abdominal pain     Rx / DC Orders   ED Discharge Orders          Ordered    Hyoscyamine Sulfate SL (LEVSIN/SL) 0.125 MG SUBL  4 times daily PRN        05/09/21 1657             Note:  This document was prepared using Dragon voice recognition software and may include unintentional dictation errors.   Victorino Dike, FNP 05/09/21 1658    Harvest Dark, MD 05/09/21 2100

## 2021-05-30 ENCOUNTER — Other Ambulatory Visit: Payer: Self-pay

## 2021-05-30 ENCOUNTER — Emergency Department: Payer: 59

## 2021-05-30 ENCOUNTER — Emergency Department
Admission: EM | Admit: 2021-05-30 | Discharge: 2021-05-30 | Disposition: A | Discharge status: Home / Self Care | Payer: 59 | Attending: Emergency Medicine | Admitting: Emergency Medicine

## 2021-05-30 DIAGNOSIS — I110 Hypertensive heart disease with heart failure: Secondary | ICD-10-CM | POA: Diagnosis not present

## 2021-05-30 DIAGNOSIS — M542 Cervicalgia: Secondary | ICD-10-CM | POA: Insufficient documentation

## 2021-05-30 DIAGNOSIS — I509 Heart failure, unspecified: Secondary | ICD-10-CM | POA: Diagnosis not present

## 2021-05-30 DIAGNOSIS — R42 Dizziness and giddiness: Secondary | ICD-10-CM | POA: Diagnosis not present

## 2021-05-30 DIAGNOSIS — M546 Pain in thoracic spine: Secondary | ICD-10-CM | POA: Insufficient documentation

## 2021-05-30 DIAGNOSIS — Z5321 Procedure and treatment not carried out due to patient leaving prior to being seen by health care provider: Secondary | ICD-10-CM | POA: Diagnosis not present

## 2021-05-30 LAB — BASIC METABOLIC PANEL
Anion gap: 11 (ref 5–15)
BUN: 28 mg/dL — ABNORMAL HIGH (ref 8–23)
CO2: 27 mmol/L (ref 22–32)
Calcium: 9.2 mg/dL (ref 8.9–10.3)
Chloride: 98 mmol/L (ref 98–111)
Creatinine, Ser: 1.4 mg/dL — ABNORMAL HIGH (ref 0.44–1.00)
GFR, Estimated: 39 mL/min — ABNORMAL LOW (ref >60)
Glucose, Bld: 179 mg/dL — ABNORMAL HIGH (ref 70–99)
Potassium: 3.5 mmol/L (ref 3.5–5.1)
Sodium: 136 mmol/L (ref 135–145)

## 2021-05-30 LAB — CBC
HCT: 35.8 % — ABNORMAL LOW (ref 36.0–46.0)
Hemoglobin: 11.3 g/dL — ABNORMAL LOW (ref 12.0–15.0)
MCH: 23.9 pg — ABNORMAL LOW (ref 26.0–34.0)
MCHC: 31.6 g/dL (ref 30.0–36.0)
MCV: 75.8 fL — ABNORMAL LOW (ref 80.0–100.0)
Platelets: 213 10*3/uL (ref 150–400)
RBC: 4.72 MIL/uL (ref 3.87–5.11)
RDW: 16 % — ABNORMAL HIGH (ref 11.5–15.5)
WBC: 6 10*3/uL (ref 4.0–10.5)
nRBC: 0 % (ref 0.0–0.2)

## 2021-05-30 LAB — TROPONIN I (HIGH SENSITIVITY)
Troponin I (High Sensitivity): 7 ng/L (ref <18)
Troponin I (High Sensitivity): 6 ng/L (ref <18)

## 2021-05-30 NOTE — ED Triage Notes (Addendum)
Patient to the ER via POV with complaints of dizziness. Reports when she stands she feels as though the world is spinning. Also reports some neck pain and back pain, denies fall or injury.  ? ?Reports a similar episode happened in February but had a negative work up.  ? ?Denies CP/ SHOB. Reports feeling faint.  ?

## 2021-05-30 NOTE — ED Notes (Signed)
Pt informed registration while this RN busy providing pt care that she is leaving.  ?

## 2021-05-30 NOTE — ED Provider Triage Note (Signed)
Emergency Medicine Provider Triage Evaluation Note ? ?Helen Mccormick , a 81 y.o. female  was evaluated in triage.  Patient has a history of hypertension, anxiety, GERD, CHF and hyperlipidemia.  Pt complains of neck pain and dizziness that started this morning.  She localizes her neck pain to the posterior neck.  She denies associated chest pain, chest tightness or shortness of breath.  She reports that she has had similar dizziness in February and was evaluated but reports that provider could not identify what was causing her dizziness. ? ?Review of Systems  ?Positive: Patient has dizziness and neck pain.  ?Negative: No chest pain or abdominal pain.  ? ?Physical Exam  ?BP (!) 97/55   Pulse 78   Temp 98.3 ?F (36.8 ?C) (Oral)   Resp 16   Ht 5\' 4"  (1.626 m)   Wt 72.6 kg   LMP  (LMP Unknown)   SpO2 98%   BMI 27.46 kg/m?  ?Gen:   Awake, no distress   ?Resp:  Normal effort  ?MSK:   Moves extremities without difficulty  ?Other:   ? ?Medical Decision Making  ?Medically screening exam initiated at 3:44 PM.  Appropriate orders placed.  Helen Mccormick was informed that the remainder of the evaluation will be completed by another provider, this initial triage assessment does not replace that evaluation, and the importance of remaining in the ED until their evaluation is complete. ? ? ?  ?Vallarie Mare [place], PA-C ?05/30/21 1545 ? ?

## 2021-06-20 ENCOUNTER — Emergency Department: Payer: 59

## 2021-06-20 ENCOUNTER — Other Ambulatory Visit: Payer: Self-pay

## 2021-06-20 DIAGNOSIS — M5442 Lumbago with sciatica, left side: Secondary | ICD-10-CM | POA: Insufficient documentation

## 2021-06-20 DIAGNOSIS — M5441 Lumbago with sciatica, right side: Secondary | ICD-10-CM | POA: Insufficient documentation

## 2021-06-20 DIAGNOSIS — M545 Low back pain, unspecified: Secondary | ICD-10-CM | POA: Diagnosis present

## 2021-06-20 DIAGNOSIS — E119 Type 2 diabetes mellitus without complications: Secondary | ICD-10-CM | POA: Diagnosis not present

## 2021-06-20 DIAGNOSIS — I10 Essential (primary) hypertension: Secondary | ICD-10-CM | POA: Diagnosis not present

## 2021-06-20 NOTE — ED Triage Notes (Signed)
Right hip pain, sharp/stabbing. Started last week and has worsened since. Reports pain starts in back and radiates to hip and leg. Pt states she also feels like knees are burning. Denies injury. NAD in triage.  ?

## 2021-06-21 ENCOUNTER — Emergency Department
Admission: EM | Admit: 2021-06-21 | Discharge: 2021-06-21 | Disposition: A | Discharge status: Home / Self Care | Payer: 59 | Attending: Emergency Medicine | Admitting: Emergency Medicine

## 2021-06-21 ENCOUNTER — Emergency Department: Payer: 59

## 2021-06-21 DIAGNOSIS — M5441 Lumbago with sciatica, right side: Secondary | ICD-10-CM

## 2021-06-21 MED ORDER — OXYCODONE-ACETAMINOPHEN 5-325 MG PO TABS
2.0000 | ORAL_TABLET | Freq: Once | ORAL | Status: AC
  Administered 2021-06-21: 2 via ORAL
  Filled 2021-06-21: qty 2

## 2021-06-21 MED ORDER — LIDOCAINE 5 % EX PTCH: 1.0000 | MEDICATED_PATCH | Freq: Two times a day (BID) | CUTANEOUS | 0 refills | Status: AC

## 2021-06-21 MED ORDER — CYCLOBENZAPRINE HCL 5 MG PO TABS: 5.0000 mg | ORAL_TABLET | Freq: Three times a day (TID) | ORAL | 0 refills | Status: DC | PRN

## 2021-06-21 MED ORDER — DICLOFENAC SODIUM 1 % EX GEL: 4.0000 g | Freq: Four times a day (QID) | CUTANEOUS | 1 refills | Status: DC

## 2021-06-21 NOTE — ED Provider Notes (Signed)
? ?[facility] ?Provider Note ? ? ? Event Date/Time  ? First MD Initiated Contact with Patient 06/21/21 [phone]   ?  (approximate) ? ? ?History  ? ?Hip Pain ? ? ?HPI ? ?Helen Mccormick is a 81 y.o. 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 ? ?She presents tonight for evaluation of right-sided low back pain that radiates down her right buttock and into her right leg.  She said it has been going on for 3 weeks.  However she said that it is worse overnight and it makes it difficult for her to bear weight at all and to ambulate.  She said that she has lost bladder control after the pain got worse, but she clarified that she did not mean that the urine leaked out, but rather that it was painful for her to get up out of her chair or bed to get to the bathroom and as a result she lost control of her bladder.  No bowel difficulties.  No fever, no recent injury of which she is aware. ? ?The problems that she had in the past were typically with her mid and lower back on the left side of her leg.   ? ?She has no headache, chest pain, SOB, nor recent injury. ?  ? ? ?Physical Exam  ? ?Triage Vital Signs: ?ED Triage Vitals  ?Enc Vitals Group  ?   BP 06/20/21 2227 (!) 174/78  ?   Pulse Rate 06/20/21 2227 63  ?   Resp 06/20/21 2227 17  ?   Temp 06/20/21 2227 98.2 ?F (36.8 ?C)  ?   Temp Source 06/20/21 2227 Oral  ?   SpO2 06/20/21 2227 95 %  ?   Weight 06/20/21 2228 72.6 kg (160 lb)  ?   Height 06/20/21 2228 1.626 m ('5\' 4"'$ )  ?   Head Circumference --   ?   Peak Flow --   ?   Pain Score 06/20/21 2228 10  ?   Pain Loc --   ?   Pain Edu? --   ?   Excl. in [place]? --   ? ? ?Most recent vital signs: ?Vitals:  ? 06/21/21 0230 06/21/21 0507  ?BP: (!) 185/78 (!) 151/65  ?Pulse: 83 87  ?Resp:  15  ?Temp:  98.2 ?F (36.8 ?C)  ?SpO2: 96% 98%   ? ? ? ?General: Awake, no distress, though she appears uncomfortable. ?CV:  Good peripheral perfusion.  ?Resp:  Normal effort.  ?Abd:  No distention.  ?Other:  Mild tenderness to palpation of the soft tissues of the right lumbar spinal area.  No tenderness palpation of the spine itself.  No visible palpable nor visible abnormalities of the back.  Patient reports that the pain radiates down the back of her right leg and she also has some pain with straight leg extension of the right leg as well as some on the left as well consistent with her prior history of sciatica. ? ? ?ED Results / Procedures / Treatments  ? ?Labs ?(all labs ordered are listed, but only abnormal results are displayed) ?Labs Reviewed - No data to display ? ? ?RADIOLOGY ?I personally reviewed the patient's hip and pelvis x-rays.  I see no fracture nor dislocation.  Radiologist comments that there is no evidence of acute abnormality. ? ?I also reviewed the patient's lumbar  spine imaging.  I do not see any evidence of acute abnormality.  Radiologist denies presence of cauda equina syndrome. ? ? ? ?PROCEDURES: ? ?Critical Care performed: No ? ?Procedures ? ? ?MEDICATIONS ORDERED IN ED: ?Medications  ?oxyCODONE-acetaminophen (PERCOCET/ROXICET) 5-325 MG per tablet 2 tablet (2 tablets Oral Given 06/21/21 0302)  ? ? ? ?IMPRESSION / MDM / ASSESSMENT AND PLAN / ED COURSE  ?I reviewed the triage vital signs and the nursing notes. ?             ?               ? ?Differential diagnosis includes, but is not limited to, musculoskeletal strain, arthritis, disc protrusion, cauda equina syndrome, discitis, osteomyelitis, transverse myelitis, abscess/infection. ? ?Patient is generally well-appearing and uncomfortable but not in severe distress.  I reviewed her medical record including neurosurgical notes from Dr. Izora Ribas from her clinic appointment on 03/20/2021.  History reviewed her prior MRI results showing some canal stenosis but no evidence of emergent  condition. ? ?Overall the patient's symptoms are consistent with sciatica and acute on chronic low back pain that is now developed into bilateral sciatica, not just on the left as previous.  However, this is a relatively acute change, particularly severe to the pain that is making it difficult for her to walk, and she is reporting some bladder dysfunction.  It sounds as if it is mostly an issue of not getting to the bathroom on time, but I cannot definitively rule out an issue such as cauda equina syndrome that is causing more substantial issues and that may represent a surgical emergency. ? ?I am providing Percocet 2 tablets by mouth and after talking with her about it, we agreed to proceed with lumbar spine MRI without contrast to rule out cauda equina syndrome.  If the results are reassuring, she can be managed conservatively as an outpatient with medication and neurosurgery follow-up. ? ? ? ? ?Clinical Course as of 06/21/21 0807  ?Sat Jun 21, 2021  ?0351 MR LUMBAR SPINE WO CONTRAST ?I personally reviewed the patient's MRI of the lumbar spine.  I did not identify any acute or emergent conditions.  The radiologist confirmed that there are no significant changes since the last MRI and no evidence of cauda equina syndrome.  No evidence of abscess visible on this noncontrast study.  I will discharge the patient with recommendations as previously discussed. [CF]  ?0517 I reassessed the patient and she is sleeping comfortably.  I woke her up and she says she feels much better.  I updated her about the stable and unchanged MRI.  She is comfortable with the plan for discharge.  Medications as listed below.  I gave my usual and customary return precautions. [CF]  ?  ?Clinical Course User Index ?[CF] Hinda Kehr, MD  ? ? ? ?FINAL CLINICAL IMPRESSION(S) / ED DIAGNOSES  ? ?Final diagnoses:  ?Acute right-sided low back pain with bilateral sciatica  ? ? ? ?Rx / DC Orders  ? ?ED Discharge Orders   ? ?      Ordered  ?   diclofenac Sodium (VOLTAREN) 1 % GEL  4 times daily       ? 06/21/21 0521  ?  lidocaine (LIDODERM) 5 %  Every 12 hours       ? 06/21/21 0521  ?  cyclobenzaprine (FLEXERIL) 5 MG tablet  3 times daily PRN       ? 06/21/21 0521  ? ?  ?  ? ?  ? ? ? ?  Note:  This document was prepared using Dragon voice recognition software and may include unintentional dictation errors. ?  ?Hinda Kehr, MD ?06/21/21 [phone] ? ?

## 2021-06-21 NOTE — Discharge Instructions (Signed)
You were evaluated in the Emergency Department today for back pain. Your evaluation suggests no acute abnormalities which require further intervention at this time.  ? ?- Move around as tolerated but avoiding heavy lifting. ?Bed rest? is not recommended nor is it the best treatment for low back pain. ? ?- Medications will help control your discomfort.  Please use the prescribed medications as written. ? ?-- Do not drink alcohol, drive a car, operate machinery, or get up on ladders or heights when taking any prescribed pain medications (specifically the Flexeril). ? ?-- Do not drive home if you received prescribed pain medications here in the ED. ? ?Please follow up with your primary care physician as needed or any other providers listed in this paperwork. If you do not have a primary doctor, you can call your insurance company to find one.  If you do not have insurance, you can go to the finance/registration department for more assistance. ? ?Return to the ED immediately if you develop any of the following problems: ?-- Leaking urine or difficulty urinating; ?-- Inability to control your bowels; ?-- New numbness or weakness in your legs or numbness between your legs; ?-- Inability to walk ?-- Fever ? ?

## 2021-06-21 NOTE — ED Notes (Signed)
ED Provider at bedside. 

## 2021-07-07 ENCOUNTER — Ambulatory Visit
Admission: RE | Admit: 2021-07-07 | Discharge: 2021-07-07 | Disposition: A | Discharge status: Home / Self Care | Payer: 59 | Source: Ambulatory Visit | Attending: Internal Medicine | Admitting: Internal Medicine

## 2021-07-07 DIAGNOSIS — Z1231 Encounter for screening mammogram for malignant neoplasm of breast: Secondary | ICD-10-CM | POA: Insufficient documentation

## 2021-07-22 ENCOUNTER — Encounter: Payer: Self-pay | Admitting: Internal Medicine

## 2021-08-21 ENCOUNTER — Other Ambulatory Visit: Payer: Self-pay | Admitting: Neurosurgery

## 2021-08-21 DIAGNOSIS — G959 Disease of spinal cord, unspecified: Secondary | ICD-10-CM

## 2021-09-08 ENCOUNTER — Ambulatory Visit: Payer: Medicare Other

## 2021-09-08 ENCOUNTER — Ambulatory Visit
Admission: RE | Admit: 2021-09-08 | Discharge: 2021-09-08 | Disposition: A | Discharge status: Home / Self Care | Payer: Medicare Other | Source: Ambulatory Visit | Attending: Neurosurgery | Admitting: Neurosurgery

## 2021-09-08 DIAGNOSIS — G959 Disease of spinal cord, unspecified: Secondary | ICD-10-CM | POA: Insufficient documentation

## 2021-09-26 ENCOUNTER — Encounter: Payer: Self-pay | Admitting: Psychology

## 2021-10-03 ENCOUNTER — Other Ambulatory Visit: Payer: Self-pay

## 2021-10-03 ENCOUNTER — Emergency Department
Admission: EM | Admit: 2021-10-03 | Discharge: 2021-10-03 | Disposition: A | Discharge status: Home / Self Care | Payer: Medicare Other | Attending: Emergency Medicine | Admitting: Emergency Medicine

## 2021-10-03 ENCOUNTER — Emergency Department: Payer: Medicare Other

## 2021-10-03 DIAGNOSIS — I110 Hypertensive heart disease with heart failure: Secondary | ICD-10-CM | POA: Diagnosis not present

## 2021-10-03 DIAGNOSIS — I509 Heart failure, unspecified: Secondary | ICD-10-CM | POA: Diagnosis not present

## 2021-10-03 DIAGNOSIS — M25512 Pain in left shoulder: Secondary | ICD-10-CM | POA: Insufficient documentation

## 2021-10-03 DIAGNOSIS — R001 Bradycardia, unspecified: Secondary | ICD-10-CM | POA: Diagnosis not present

## 2021-10-03 DIAGNOSIS — M19012 Primary osteoarthritis, left shoulder: Secondary | ICD-10-CM | POA: Diagnosis not present

## 2021-10-03 DIAGNOSIS — S4982XA Other specified injuries of left shoulder and upper arm, initial encounter: Secondary | ICD-10-CM | POA: Diagnosis not present

## 2021-10-03 DIAGNOSIS — I700 Atherosclerosis of aorta: Secondary | ICD-10-CM | POA: Diagnosis not present

## 2021-10-03 DIAGNOSIS — R079 Chest pain, unspecified: Secondary | ICD-10-CM | POA: Diagnosis not present

## 2021-10-03 LAB — BASIC METABOLIC PANEL
Anion gap: 9 (ref 5–15)
BUN: 20 mg/dL (ref 8–23)
CO2: 24 mmol/L (ref 22–32)
Calcium: 9 mg/dL (ref 8.9–10.3)
Chloride: 107 mmol/L (ref 98–111)
Creatinine, Ser: 0.84 mg/dL (ref 0.44–1.00)
GFR, Estimated: >60 mL/min (ref >60)
Glucose, Bld: 103 mg/dL — ABNORMAL HIGH (ref 70–99)
Potassium: 3.7 mmol/L (ref 3.5–5.1)
Sodium: 140 mmol/L (ref 135–145)

## 2021-10-03 LAB — CBC
HCT: 36.3 % (ref 36.0–46.0)
Hemoglobin: 11.1 g/dL — ABNORMAL LOW (ref 12.0–15.0)
MCH: 23.6 pg — ABNORMAL LOW (ref 26.0–34.0)
MCHC: 30.6 g/dL (ref 30.0–36.0)
MCV: 77.2 fL — ABNORMAL LOW (ref 80.0–100.0)
Platelets: 195 10*3/uL (ref 150–400)
RBC: 4.7 MIL/uL (ref 3.87–5.11)
RDW: 16.9 % — ABNORMAL HIGH (ref 11.5–15.5)
WBC: 4.8 10*3/uL (ref 4.0–10.5)
nRBC: 0 % (ref 0.0–0.2)

## 2021-10-03 LAB — TROPONIN I (HIGH SENSITIVITY): Troponin I (High Sensitivity): 5 ng/L (ref <18)

## 2021-10-03 MED ORDER — PREDNISONE 20 MG PO TABS: 40.0000 mg | ORAL_TABLET | Freq: Every day | ORAL | 0 refills | Status: AC

## 2021-10-03 MED ORDER — HYDROCODONE-ACETAMINOPHEN 5-325 MG PO TABS: 1.0000 | ORAL_TABLET | ORAL | 0 refills | Status: DC | PRN

## 2021-10-03 NOTE — Discharge Instructions (Addendum)
Please wear your sling during the day.  Please call the number provided for orthopedics to arrange a follow-up appointment as soon as possible.  Please take your pain medication as needed but only as prescribed.  Do not drink alcohol or drive while taking this medication.  Please take your steroids as prescribed for the next 5 days.

## 2021-10-03 NOTE — ED Provider Notes (Signed)
[facility] Provider Note    Event Date/Time   First MD Initiated Contact with Patient 10/03/21 1148     (approximate)  History   Chief Complaint: Chest Pain  HPI  Helen Mccormick is a 81 y.o. female with a past medical history of CHF, hypertension, hyperlipidemia, presents emergency department for left shoulder pain.  According to the patient for the past 2 months she has been experiencing pain in the left shoulder.  She states it was worse yesterday and she could not sleep overnight last night due to the pain so she came to the emergency department today for evaluation.  Patient denies any pain in the chest today.  Denies any shortness of breath denies any nausea or diaphoresis.  Patient states the pain is worse with movement of the left shoulder.  Physical Exam   Triage Vital Signs: ED Triage Vitals  Enc Vitals Group     BP 10/03/21 1037 127/68     Pulse Rate 10/03/21 1037 60     Resp 10/03/21 1037 16     Temp 10/03/21 1037 98.1 F (36.7 C)     Temp Source 10/03/21 1037 Oral     SpO2 10/03/21 1037 95 %     Weight 10/03/21 1041 172 lb (78 kg)     Height --      Head Circumference --      Peak Flow --      Pain Score 10/03/21 1040 10     Pain Loc --      Pain Edu? --      Excl. in [place]? --     Most recent vital signs: Vitals:   10/03/21 1037 10/03/21 1145  BP: 127/68 (!) 143/70  Pulse: 60 (!) 52  Resp: 16 18  Temp: 98.1 F (36.7 C)   SpO2: 95% 100%    General: Awake, no distress.  CV:  Good peripheral perfusion.  Regular rate and rhythm  Resp:  Normal effort.  Equal breath sounds bilaterally.  Abd:  No distention.  Soft, nontender.  No rebound or guarding. Other:  Pain with range of motion of the left shoulder.  More so with active range of motion, less so with passive range of motion.   ED Results / Procedures / Treatments   EKG  EKG viewed and interpreted by myself shows sinus bradycardia at 59 bpm with a widened QRS, left axis  deviation, largely normal intervals, most consistent with left bundle branch block.  Prior EKG from 05/30/2021 also shows left bundle branch block.  RADIOLOGY  I have viewed and interpreted the chest x-ray images.  No acute finding on my evaluation. Radiology has read the x-ray as negative. Shoulder x-ray shows elevation of the humeral head possibly indicating rotator cuff tendinopathy.  No acute bony abnormality   MEDICATIONS ORDERED IN ED: Medications - No data to display   IMPRESSION / MDM / [place] / ED COURSE  I reviewed the triage vital signs and the nursing notes.  Patient's presentation is most consistent with acute presentation with potential threat to life or bodily function.  Patient presents to the emergency department for left shoulder pain, worse with movement.  Cannot sleep due to the pain.  States ongoing for 2 months but getting worse.  On examination she has more pain with active range of motion especially abduction.  Neurovascular intact distally.  X-ray is concerning for rotator cuff tendinopathy which also fits her clinical presentation.  Reassuringly her  chest x-ray is clear EKG is unchanged CBC is normal, chemistry is normal and troponin is negative.  Given the patient's reassuring work-up I believe the patient will be safe for discharge home.  We will place patient on a short course of pain medication and prednisone, have the patient wear a sling during the day and follow-up with orthopedics in 1 week.  Patient agreeable to plan of care.  FINAL CLINICAL IMPRESSION(S) / ED DIAGNOSES   Left shoulder pain   Note:  This document was prepared using Dragon voice recognition software and may include unintentional dictation errors.   Harvest Dark, MD 10/03/21 1204

## 2021-10-03 NOTE — ED Triage Notes (Signed)
Pt c/o left shoulder pain for the past month, denies injury, states it hurts so hard the pain radiates into her chest. States she was not able to get any rest last night due to the pt, denies SOB or other sx. Pt is ambulatory with a steady gait.

## 2021-10-09 DIAGNOSIS — G4733 Obstructive sleep apnea (adult) (pediatric): Secondary | ICD-10-CM | POA: Diagnosis not present

## 2021-10-09 DIAGNOSIS — I10 Essential (primary) hypertension: Secondary | ICD-10-CM | POA: Diagnosis not present

## 2021-10-10 DIAGNOSIS — I700 Atherosclerosis of aorta: Secondary | ICD-10-CM | POA: Diagnosis not present

## 2021-10-10 DIAGNOSIS — M5030 Other cervical disc degeneration, unspecified cervical region: Secondary | ICD-10-CM | POA: Diagnosis not present

## 2021-10-10 DIAGNOSIS — Z981 Arthrodesis status: Secondary | ICD-10-CM | POA: Diagnosis not present

## 2021-10-10 DIAGNOSIS — M4802 Spinal stenosis, cervical region: Secondary | ICD-10-CM | POA: Diagnosis not present

## 2021-10-10 DIAGNOSIS — E1169 Type 2 diabetes mellitus with other specified complication: Secondary | ICD-10-CM | POA: Diagnosis not present

## 2021-10-10 DIAGNOSIS — E119 Type 2 diabetes mellitus without complications: Secondary | ICD-10-CM | POA: Diagnosis not present

## 2021-10-10 DIAGNOSIS — E785 Hyperlipidemia, unspecified: Secondary | ICD-10-CM | POA: Diagnosis not present

## 2021-10-27 DIAGNOSIS — J45998 Other asthma: Secondary | ICD-10-CM | POA: Diagnosis not present

## 2021-10-27 DIAGNOSIS — J40 Bronchitis, not specified as acute or chronic: Secondary | ICD-10-CM | POA: Diagnosis not present

## 2021-10-27 DIAGNOSIS — I340 Nonrheumatic mitral (valve) insufficiency: Secondary | ICD-10-CM | POA: Diagnosis not present

## 2021-10-27 DIAGNOSIS — I429 Cardiomyopathy, unspecified: Secondary | ICD-10-CM | POA: Diagnosis not present

## 2021-10-27 DIAGNOSIS — G4730 Sleep apnea, unspecified: Secondary | ICD-10-CM | POA: Diagnosis not present

## 2021-10-27 DIAGNOSIS — K2100 Gastro-esophageal reflux disease with esophagitis, without bleeding: Secondary | ICD-10-CM | POA: Diagnosis not present

## 2021-10-27 DIAGNOSIS — I351 Nonrheumatic aortic (valve) insufficiency: Secondary | ICD-10-CM | POA: Diagnosis not present

## 2021-10-27 DIAGNOSIS — I2510 Atherosclerotic heart disease of native coronary artery without angina pectoris: Secondary | ICD-10-CM | POA: Diagnosis not present

## 2021-10-27 DIAGNOSIS — I509 Heart failure, unspecified: Secondary | ICD-10-CM | POA: Diagnosis not present

## 2021-10-27 DIAGNOSIS — I10 Essential (primary) hypertension: Secondary | ICD-10-CM | POA: Diagnosis not present

## 2021-10-27 DIAGNOSIS — E782 Mixed hyperlipidemia: Secondary | ICD-10-CM | POA: Diagnosis not present

## 2021-11-09 DIAGNOSIS — G4733 Obstructive sleep apnea (adult) (pediatric): Secondary | ICD-10-CM | POA: Diagnosis not present

## 2021-11-09 DIAGNOSIS — I10 Essential (primary) hypertension: Secondary | ICD-10-CM | POA: Diagnosis not present

## 2021-11-17 DIAGNOSIS — M5442 Lumbago with sciatica, left side: Secondary | ICD-10-CM | POA: Diagnosis not present

## 2021-11-17 DIAGNOSIS — G8929 Other chronic pain: Secondary | ICD-10-CM | POA: Diagnosis not present

## 2021-11-17 DIAGNOSIS — M48062 Spinal stenosis, lumbar region with neurogenic claudication: Secondary | ICD-10-CM | POA: Diagnosis not present

## 2021-11-21 DIAGNOSIS — E119 Type 2 diabetes mellitus without complications: Secondary | ICD-10-CM | POA: Diagnosis not present

## 2021-11-21 DIAGNOSIS — M48062 Spinal stenosis, lumbar region with neurogenic claudication: Secondary | ICD-10-CM | POA: Diagnosis not present

## 2021-11-27 DIAGNOSIS — I509 Heart failure, unspecified: Secondary | ICD-10-CM | POA: Diagnosis not present

## 2021-11-27 DIAGNOSIS — I422 Other hypertrophic cardiomyopathy: Secondary | ICD-10-CM | POA: Diagnosis not present

## 2021-12-02 DIAGNOSIS — I340 Nonrheumatic mitral (valve) insufficiency: Secondary | ICD-10-CM | POA: Diagnosis not present

## 2021-12-02 DIAGNOSIS — G4739 Other sleep apnea: Secondary | ICD-10-CM | POA: Diagnosis not present

## 2021-12-02 DIAGNOSIS — I2510 Atherosclerotic heart disease of native coronary artery without angina pectoris: Secondary | ICD-10-CM | POA: Diagnosis not present

## 2021-12-02 DIAGNOSIS — E782 Mixed hyperlipidemia: Secondary | ICD-10-CM | POA: Diagnosis not present

## 2021-12-02 DIAGNOSIS — K219 Gastro-esophageal reflux disease without esophagitis: Secondary | ICD-10-CM | POA: Diagnosis not present

## 2021-12-02 DIAGNOSIS — R0602 Shortness of breath: Secondary | ICD-10-CM | POA: Diagnosis not present

## 2021-12-02 DIAGNOSIS — I420 Dilated cardiomyopathy: Secondary | ICD-10-CM | POA: Diagnosis not present

## 2021-12-02 DIAGNOSIS — I10 Essential (primary) hypertension: Secondary | ICD-10-CM | POA: Diagnosis not present

## 2021-12-02 DIAGNOSIS — I351 Nonrheumatic aortic (valve) insufficiency: Secondary | ICD-10-CM | POA: Diagnosis not present

## 2021-12-08 DIAGNOSIS — M5442 Lumbago with sciatica, left side: Secondary | ICD-10-CM | POA: Diagnosis not present

## 2021-12-08 DIAGNOSIS — G8929 Other chronic pain: Secondary | ICD-10-CM | POA: Diagnosis not present

## 2021-12-08 DIAGNOSIS — M48062 Spinal stenosis, lumbar region with neurogenic claudication: Secondary | ICD-10-CM | POA: Diagnosis not present

## 2021-12-11 ENCOUNTER — Other Ambulatory Visit: Payer: Self-pay | Admitting: Internal Medicine

## 2021-12-11 DIAGNOSIS — M25562 Pain in left knee: Secondary | ICD-10-CM | POA: Diagnosis not present

## 2021-12-11 DIAGNOSIS — N951 Menopausal and female climacteric states: Secondary | ICD-10-CM

## 2021-12-11 DIAGNOSIS — E559 Vitamin D deficiency, unspecified: Secondary | ICD-10-CM | POA: Diagnosis not present

## 2021-12-11 DIAGNOSIS — I2510 Atherosclerotic heart disease of native coronary artery without angina pectoris: Secondary | ICD-10-CM | POA: Diagnosis not present

## 2021-12-11 DIAGNOSIS — E119 Type 2 diabetes mellitus without complications: Secondary | ICD-10-CM | POA: Diagnosis not present

## 2021-12-11 DIAGNOSIS — R079 Chest pain, unspecified: Secondary | ICD-10-CM | POA: Diagnosis not present

## 2021-12-11 DIAGNOSIS — G4730 Sleep apnea, unspecified: Secondary | ICD-10-CM | POA: Diagnosis not present

## 2021-12-11 DIAGNOSIS — E782 Mixed hyperlipidemia: Secondary | ICD-10-CM | POA: Diagnosis not present

## 2021-12-11 DIAGNOSIS — K219 Gastro-esophageal reflux disease without esophagitis: Secondary | ICD-10-CM | POA: Diagnosis not present

## 2021-12-11 DIAGNOSIS — E038 Other specified hypothyroidism: Secondary | ICD-10-CM | POA: Diagnosis not present

## 2021-12-11 DIAGNOSIS — I10 Essential (primary) hypertension: Secondary | ICD-10-CM | POA: Diagnosis not present

## 2021-12-31 DIAGNOSIS — G8929 Other chronic pain: Secondary | ICD-10-CM | POA: Diagnosis not present

## 2021-12-31 DIAGNOSIS — M5442 Lumbago with sciatica, left side: Secondary | ICD-10-CM | POA: Diagnosis not present

## 2022-01-22 ENCOUNTER — Telehealth: Payer: Self-pay

## 2022-01-22 NOTE — Telephone Encounter (Signed)
Have her see me since it's been a few months to make sure nothing has changed.

## 2022-01-22 NOTE — Telephone Encounter (Signed)
-----   Message from Peggyann Shoals sent at 01/22/2022 11:03 AM EDT ----- Regarding: back pain Contact: [phone] Patient walked up to the window asking for an appt with Dr.Yarbrough for back pain. She is currently seeing Dr. Alba Destine and [facility] PT. There is a referral from this year to [facility] for injections and SCS trail with Dr.Lateef but referral expired. Should she come back and see Dr.Y or Stacy? Do you just want to refer her to [facility] again?

## 2022-01-23 NOTE — Telephone Encounter (Signed)
Left message to call back  

## 2022-01-27 DIAGNOSIS — E782 Mixed hyperlipidemia: Secondary | ICD-10-CM | POA: Diagnosis not present

## 2022-01-27 DIAGNOSIS — I10 Essential (primary) hypertension: Secondary | ICD-10-CM | POA: Diagnosis not present

## 2022-01-27 DIAGNOSIS — Z23 Encounter for immunization: Secondary | ICD-10-CM | POA: Diagnosis not present

## 2022-01-27 DIAGNOSIS — Z0000 Encounter for general adult medical examination without abnormal findings: Secondary | ICD-10-CM | POA: Diagnosis not present

## 2022-01-27 DIAGNOSIS — I509 Heart failure, unspecified: Secondary | ICD-10-CM | POA: Diagnosis not present

## 2022-01-27 DIAGNOSIS — E119 Type 2 diabetes mellitus without complications: Secondary | ICD-10-CM | POA: Diagnosis not present

## 2022-01-27 DIAGNOSIS — G4730 Sleep apnea, unspecified: Secondary | ICD-10-CM | POA: Diagnosis not present

## 2022-01-27 DIAGNOSIS — M48061 Spinal stenosis, lumbar region without neurogenic claudication: Secondary | ICD-10-CM | POA: Diagnosis not present

## 2022-01-27 NOTE — Telephone Encounter (Signed)
Left message to call back  

## 2022-02-02 NOTE — Telephone Encounter (Signed)
Left message to call back  

## 2022-02-03 ENCOUNTER — Other Ambulatory Visit: Payer: Self-pay

## 2022-02-03 ENCOUNTER — Emergency Department
Admission: EM | Admit: 2022-02-03 | Discharge: 2022-02-03 | Disposition: A | Discharge status: Home / Self Care | Payer: Medicare Other | Attending: Emergency Medicine | Admitting: Emergency Medicine

## 2022-02-03 ENCOUNTER — Encounter: Payer: Self-pay | Admitting: Emergency Medicine

## 2022-02-03 DIAGNOSIS — R197 Diarrhea, unspecified: Secondary | ICD-10-CM | POA: Insufficient documentation

## 2022-02-03 DIAGNOSIS — M5412 Radiculopathy, cervical region: Secondary | ICD-10-CM | POA: Diagnosis not present

## 2022-02-03 DIAGNOSIS — R001 Bradycardia, unspecified: Secondary | ICD-10-CM | POA: Insufficient documentation

## 2022-02-03 DIAGNOSIS — M7918 Myalgia, other site: Secondary | ICD-10-CM | POA: Insufficient documentation

## 2022-02-03 DIAGNOSIS — R059 Cough, unspecified: Secondary | ICD-10-CM | POA: Insufficient documentation

## 2022-02-03 DIAGNOSIS — M542 Cervicalgia: Secondary | ICD-10-CM | POA: Diagnosis present

## 2022-02-03 DIAGNOSIS — I509 Heart failure, unspecified: Secondary | ICD-10-CM | POA: Insufficient documentation

## 2022-02-03 DIAGNOSIS — I110 Hypertensive heart disease with heart failure: Secondary | ICD-10-CM | POA: Insufficient documentation

## 2022-02-03 LAB — CBC
HCT: 35.4 % — ABNORMAL LOW (ref 36.0–46.0)
Hemoglobin: 11.3 g/dL — ABNORMAL LOW (ref 12.0–15.0)
MCH: 24.1 pg — ABNORMAL LOW (ref 26.0–34.0)
MCHC: 31.9 g/dL (ref 30.0–36.0)
MCV: 75.6 fL — ABNORMAL LOW (ref 80.0–100.0)
Platelets: 186 10*3/uL (ref 150–400)
RBC: 4.68 MIL/uL (ref 3.87–5.11)
RDW: 15.9 % — ABNORMAL HIGH (ref 11.5–15.5)
WBC: 6 10*3/uL (ref 4.0–10.5)
nRBC: 0 % (ref 0.0–0.2)

## 2022-02-03 LAB — COMPREHENSIVE METABOLIC PANEL
ALT: 21 U/L (ref 0–44)
AST: 23 U/L (ref 15–41)
Albumin: 3.7 g/dL (ref 3.5–5.0)
Alkaline Phosphatase: 85 U/L (ref 38–126)
Anion gap: 7 (ref 5–15)
BUN: 18 mg/dL (ref 8–23)
CO2: 26 mmol/L (ref 22–32)
Calcium: 8.9 mg/dL (ref 8.9–10.3)
Chloride: 106 mmol/L (ref 98–111)
Creatinine, Ser: 0.94 mg/dL (ref 0.44–1.00)
GFR, Estimated: >60 mL/min (ref >60)
Glucose, Bld: 110 mg/dL — ABNORMAL HIGH (ref 70–99)
Potassium: 3.7 mmol/L (ref 3.5–5.1)
Sodium: 139 mmol/L (ref 135–145)
Total Bilirubin: 0.7 mg/dL (ref 0.3–1.2)
Total Protein: 7.1 g/dL (ref 6.5–8.1)

## 2022-02-03 LAB — TROPONIN I (HIGH SENSITIVITY): Troponin I (High Sensitivity): 6 ng/L (ref <18)

## 2022-02-03 MED ORDER — MORPHINE SULFATE (PF) 4 MG/ML IV SOLN
4.0000 mg | Freq: Once | INTRAVENOUS | Status: AC
  Administered 2022-02-03: 4 mg via INTRAMUSCULAR
  Filled 2022-02-03: qty 1

## 2022-02-03 MED ORDER — ONDANSETRON 4 MG PO TBDP
4.0000 mg | ORAL_TABLET | Freq: Once | ORAL | Status: AC
  Administered 2022-02-03: 4 mg via ORAL
  Filled 2022-02-03: qty 1

## 2022-02-03 MED ORDER — PREDNISONE 10 MG PO TABS: 10.0000 mg | ORAL_TABLET | Freq: Every day | ORAL | 0 refills | Status: DC

## 2022-02-03 NOTE — ED Provider Notes (Signed)
[facility] Provider Note    Event Date/Time   First MD Initiated Contact with Patient 02/03/22 1152     (approximate)  History   Chief Complaint: Generalized Body Aches, Cough, and Diarrhea  HPI  Helen Mccormick is a 80 y.o. female with a past medical history of anxiety, CHF, depression, hypertension, hyperlipidemia, chronic neck and back pain presents to the emergency department for evaluation.  According to the patient she has chronic pain and is prescribed oxycodone which she states for the past month or so her pain has been worse especially in her right neck going down into her right arm.  Patient denies any chest pain denies any shortness of breath.  Denies any fever.  Patient states she was up all last night due to the pain so she came to the emergency department today for evaluation.  Patient denies any numbness or weakness.  States the pain is worse with movement of the neck or the right arm.   Physical Exam   Triage Vital Signs: ED Triage Vitals  Enc Vitals Group     BP 02/03/22 1110 (!) 111/55     Pulse Rate 02/03/22 1110 75     Resp 02/03/22 1110 17     Temp 02/03/22 1110 98.3 F (36.8 C)     Temp Source 02/03/22 1110 Oral     SpO2 02/03/22 1110 95 %     Weight 02/03/22 1118 150 lb (68 kg)     Height 02/03/22 1118 '5\' 4"'$  (1.626 m)     Head Circumference --      Peak Flow --      Pain Score 02/03/22 1118 10     Pain Loc --      Pain Edu? --      Excl. in [place]? --     Most recent vital signs: Vitals:   02/03/22 1110  BP: (!) 111/55  Pulse: 75  Resp: 17  Temp: 98.3 F (36.8 C)  SpO2: 95%    General: Awake, no distress.  CV:  Good peripheral perfusion.  Regular rate and rhythm  Resp:  Normal effort.  Equal breath sounds bilaterally.  Abd:  No distention.  Soft, nontender.  No rebound or guarding. Other:  Mild tenderness to palpation over the right trapezius into the right shoulder.  Mild pain elicited with range of motion of the  shoulder but good range of motion without concern for fracture or dislocation.  Neurovascular intact distally without swelling noted.   ED Results / Procedures / Treatments   EKG  EKG viewed and interpreted by myself shows sinus bradycardia at 59 bpm with a slightly widened QRS, normal axis, normal intervals with nonspecific ST changes without ST elevation.   MEDICATIONS ORDERED IN ED: Medications  morphine (PF) 4 MG/ML injection 4 mg (has no administration in time range)  ondansetron (ZOFRAN-ODT) disintegrating tablet 4 mg (has no administration in time range)     IMPRESSION / MDM / ASSESSMENT AND PLAN / ED COURSE  I reviewed the triage vital signs and the nursing notes.  Patient's presentation is most consistent with acute presentation with potential threat to life or bodily function.  Patient presents emergency department for worsening pain in her right neck extending down into her right arm.  Patient does have mild to moderate tenderness over the right trapezius.  Given the location of the pain we will check labs including a troponin and obtain an EKG as a precaution.  However given  the patient's description of the pain are chronic pain and pain worse with movement of the neck and the arm especially with radiation into the arm I highly suspect cervical radiculopathy.  Patient is already taking pain medication at home but possible addition of a steroid taper would be beneficial for the patient. Patient's work-up is reassuring with a normal CBC, normal chemistry, negative troponin.  EKG shows no concerning findings.  We will have the patient follow-up with her doctor.  We will place the patient on a prednisone taper.  FINAL CLINICAL IMPRESSION(S) / ED DIAGNOSES   Cervical radiculopathy  Rx / DC Orders   Prednisone  Note:  This document was prepared using Dragon voice recognition software and may include unintentional dictation errors.   Harvest Dark, MD 02/03/22 1334

## 2022-02-03 NOTE — ED Triage Notes (Signed)
Pt to ER states she is having pain "all over".  Pt states she has spine problems and arthritis and has chronic pain.  States this pain is her normal pain, but is some worse.  Pt states she also has a cough and diarrhea.  States she has had these symptoms for several weeks.

## 2022-02-04 DIAGNOSIS — Z1212 Encounter for screening for malignant neoplasm of rectum: Secondary | ICD-10-CM | POA: Diagnosis not present

## 2022-02-04 DIAGNOSIS — Z1211 Encounter for screening for malignant neoplasm of colon: Secondary | ICD-10-CM | POA: Diagnosis not present

## 2022-02-10 DIAGNOSIS — M9931 Osseous stenosis of neural canal of cervical region: Secondary | ICD-10-CM | POA: Diagnosis not present

## 2022-02-10 DIAGNOSIS — M7062 Trochanteric bursitis, left hip: Secondary | ICD-10-CM | POA: Diagnosis not present

## 2022-02-10 DIAGNOSIS — M5412 Radiculopathy, cervical region: Secondary | ICD-10-CM | POA: Diagnosis not present

## 2022-02-10 DIAGNOSIS — M542 Cervicalgia: Secondary | ICD-10-CM | POA: Diagnosis not present

## 2022-02-10 DIAGNOSIS — M48062 Spinal stenosis, lumbar region with neurogenic claudication: Secondary | ICD-10-CM | POA: Diagnosis not present

## 2022-02-10 DIAGNOSIS — M5442 Lumbago with sciatica, left side: Secondary | ICD-10-CM | POA: Diagnosis not present

## 2022-02-10 DIAGNOSIS — G8929 Other chronic pain: Secondary | ICD-10-CM | POA: Diagnosis not present

## 2022-02-12 DIAGNOSIS — I10 Essential (primary) hypertension: Secondary | ICD-10-CM | POA: Diagnosis not present

## 2022-02-12 DIAGNOSIS — J40 Bronchitis, not specified as acute or chronic: Secondary | ICD-10-CM | POA: Diagnosis not present

## 2022-02-12 DIAGNOSIS — I2510 Atherosclerotic heart disease of native coronary artery without angina pectoris: Secondary | ICD-10-CM | POA: Diagnosis not present

## 2022-02-12 DIAGNOSIS — G4730 Sleep apnea, unspecified: Secondary | ICD-10-CM | POA: Diagnosis not present

## 2022-02-12 DIAGNOSIS — K2100 Gastro-esophageal reflux disease with esophagitis, without bleeding: Secondary | ICD-10-CM | POA: Diagnosis not present

## 2022-02-12 DIAGNOSIS — I340 Nonrheumatic mitral (valve) insufficiency: Secondary | ICD-10-CM | POA: Diagnosis not present

## 2022-02-12 DIAGNOSIS — J45998 Other asthma: Secondary | ICD-10-CM | POA: Diagnosis not present

## 2022-02-12 DIAGNOSIS — E782 Mixed hyperlipidemia: Secondary | ICD-10-CM | POA: Diagnosis not present

## 2022-02-12 DIAGNOSIS — I429 Cardiomyopathy, unspecified: Secondary | ICD-10-CM | POA: Diagnosis not present

## 2022-02-12 DIAGNOSIS — I351 Nonrheumatic aortic (valve) insufficiency: Secondary | ICD-10-CM | POA: Diagnosis not present

## 2022-03-10 DIAGNOSIS — G8929 Other chronic pain: Secondary | ICD-10-CM | POA: Diagnosis not present

## 2022-03-10 DIAGNOSIS — M48062 Spinal stenosis, lumbar region with neurogenic claudication: Secondary | ICD-10-CM | POA: Diagnosis not present

## 2022-03-10 DIAGNOSIS — M5442 Lumbago with sciatica, left side: Secondary | ICD-10-CM | POA: Diagnosis not present

## 2022-03-16 DIAGNOSIS — E119 Type 2 diabetes mellitus without complications: Secondary | ICD-10-CM | POA: Diagnosis not present

## 2022-03-16 DIAGNOSIS — M48062 Spinal stenosis, lumbar region with neurogenic claudication: Secondary | ICD-10-CM | POA: Diagnosis not present

## 2022-03-18 NOTE — Progress Notes (Deleted)
Referring Physician:  Dionisio David, MD [address] [place],  [place] [address]  Primary Physician:  Dionisio David, MD  History of Present Illness: 03/18/2022*** Helen Mccormick has a history of DM, GERD, hyperlipidemia, HTN, migraines, nonischemic cardiolmyopathy, and thyroid disease.   She was last seen by Danielle on 08/21/21 for her lumbar spine. Lumbar MRI from 06/19/21 showed moderate L4-L5 central stenosis and slip L3-L3.   MRI of thoracic and cervical spine ordered as she had a positive hoffmans and hyper-reflexia.   She was referred to pain management Holley Raring) for consideration of SCS and evaluation of chronic pain.   She is here for follow up.     Duration: *** Location: *** Quality: *** Severity: ***  Precipitating: aggravated by *** Modifying factors: made better by *** Weakness: none Timing: *** Bowel/Bladder Dysfunction: none  Conservative measures:  Physical therapy: doing PT at [facility] (last visit 01/22/22?***) Multimodal medical therapy including regular antiinflammatories: tylenol, meloxicam, tizanidine, tramadol Injections: 03/16/22 left L4-L5 and left L5-S1 TF ESI 11/21/21 left L4-L5 and left L5-S1 TF ESI 11/21/20: Left L5-S1 ESI 07/11/20: Left L4-5 ESI  06/04/20: Left L5-S1 ESI 11/24/2019: Bilateral T10 and L5 trigger point injections 07/25/2019: Left greater trochanteric bursa injection (good relief x2 weeks) 04/26/2019: Left greater trochanteric bursa injection (good relief for 2 months) 01/24/2019: Left greater trochanteric bursa injection (approximately 3 months of good relief) 10/24/2018: Left greater trochanteric bursa injection (good relief) 09/20/2018: Left L5-S1 ESI (good relief) 04/20/2018: Left L5-S1 ESI (good relief) 01/25/2018: Left L5-S1 ESI (good relief) 01/03/2018: Bilateral levator scapula trigger point injection (moderate to good relief) 06/29/2017: Left L5-S1 ESI (good relief) 04/19/2017: Bilateral levator scapula trigger point  injection (good relief) 01/27/2017: Left L5-S1 transforaminal ESI (good relief) 12/22/2016: Right T9, T10, T11 trigger point point injection (good relief) 08/31/2016: Right T8, T10, T12 trigger point injections (good relief times 5 weeks) 06/25/2016: Left L5-S1 ESI (good relief) 03/12/2016: Bilateral ESI (no relief) 08/20/2015: Bilateral trochanteric bursa injections (sustained left-sided relief, 5 weeks for relief of right-sided pain) 04/29/2015: Right shoulder injection (Dr. Angelene Giovanni) 10/30/2014: Left trochanteric bursa injection (good relief) 02/14/2014: Left shoulder injection (April Bernt, good relief)   Past Surgery:  C3-4 ACDF at [facility] in 2001 Lumbar surgery at [facility] in 2001   [place] has ***no symptoms of cervical myelopathy.  The symptoms are causing a significant impact on the patient's life.   Review of Systems:  A 10 point review of systems is negative, except for the pertinent positives and negatives detailed in the HPI.  Past Medical History: Past Medical History:  Diagnosis Date   Allergy    Anxiety    CHF (congestive heart failure) (HCC)    Colon polyp    Depression    GERD (gastroesophageal reflux disease)    Heart murmur    Hyperlipidemia    Hypertension    Hypothyroidism    Sleep apnea    Thyroid disease     Past Surgical History: Past Surgical History:  Procedure Laterality Date   ABDOMINAL HYSTERECTOMY     BACK SURGERY     BACK SURGERY     CERVICAL FUSION     COLONOSCOPY WITH PROPOFOL N/A 05/28/2017   Procedure: COLONOSCOPY WITH PROPOFOL;  Surgeon: Jonathon Bellows, MD;  Location: [facility] ENDOSCOPY;  Service: Gastroenterology;  Laterality: N/A;   COLONOSCOPY WITH PROPOFOL N/A 11/17/2019   Procedure: COLONOSCOPY WITH PROPOFOL;  Surgeon: Lesly Rubenstein, MD;  Location: [facility] ENDOSCOPY;  Service: Endoscopy;  Laterality: N/A;  ESOPHAGOGASTRODUODENOSCOPY (EGD) WITH PROPOFOL N/A 11/17/2019   Procedure: ESOPHAGOGASTRODUODENOSCOPY (EGD) WITH PROPOFOL;   Surgeon: Lesly Rubenstein, MD;  Location: [facility] ENDOSCOPY;  Service: Endoscopy;  Laterality: N/A;   NECK SURGERY     THYROID SURGERY     THYROID SURGERY      Allergies: Allergies as of 03/19/2022 - Review Complete 02/03/2022  Allergen Reaction Noted   Amlodipine Hives and Swelling 12/24/2014   Tramadol Other (See Comments) 06/14/2019   Codeine Nausea Only 09/11/2020   Etodolac Other (See Comments) 04/22/2016    Medications: Outpatient Encounter Medications as of 03/19/2022  Medication Sig   acetaminophen (TYLENOL) 325 MG tablet Take 2 tablets (650 mg total) by mouth every 6 (six) hours as needed.   albuterol (PROVENTIL HFA;VENTOLIN HFA) 108 (90 Base) MCG/ACT inhaler Inhale into the lungs every 6 (six) hours as needed for wheezing or shortness of breath.   amLODipine (NORVASC) 5 MG tablet TAKE 1 TABLET(5 MG) BY MOUTH DAILY FOR BLOOD PRESSURE   aspirin 81 MG chewable tablet Chew 81 mg by mouth daily.    atenolol (TENORMIN) 50 MG tablet Take 1 tablet by mouth daily.   atorvastatin (LIPITOR) 20 MG tablet Take 1 tablet by mouth daily.   Azelastine HCl 137 MCG/SPRAY SOLN SMARTSIG:1-2 Spray(s) Both Nares Twice Daily   Baclofen 5 MG TABS Take by mouth at bedtime.   betamethasone dipropionate 0.05 % cream Apply topically.   Blood Glucose Monitoring Suppl (ACCU-CHEK GUIDE) w/Device KIT See admin instructions.   calcium carbonate (OS-CAL) 600 MG TABS tablet Take 600 mg by mouth 2 (two) times daily with a meal.   carvedilol (COREG) 12.5 MG tablet Take 12.5 mg by mouth 2 (two) times daily.   cefdinir (OMNICEF) 300 MG capsule Take by mouth.   cetirizine (ZYRTEC) 10 MG tablet Take 1 tablet by mouth daily.   cyclobenzaprine (FLEXERIL) 5 MG tablet Take 1 tablet (5 mg total) by mouth 3 (three) times daily as needed for muscle spasms.   diclofenac Sodium (VOLTAREN) 1 % GEL Apply 4 g topically 4 (four) times daily.   dicyclomine (BENTYL) 10 MG capsule Take 1 capsule by mouth 3 (three) times daily as  needed.   ENTRESTO 24-26 MG Take 1 tablet by mouth 2 (two) times daily.   ergocalciferol (VITAMIN D2) 1.25 MG (50000 UT) capsule ergocalciferol (vitamin D2) 1,250 mcg (50,000 unit) capsule  TK 1 C PO 1 TIME A WK   escitalopram (LEXAPRO) 10 MG tablet TAKE 1 TABLET BY MOUTH EVERY DAY FOR DEPRESSION   famotidine (PEPCID) 20 MG tablet Take 1 tablet (20 mg total) by mouth 2 (two) times daily.   Flaxseed, Linseed, (FLAX SEED OIL) 1000 MG CAPS Take by mouth.   FLOVENT HFA 44 MCG/ACT inhaler SMARTSIG:2 Puff(s) By Mouth Twice Daily   fluticasone (FLONASE) 50 MCG/ACT nasal spray Place 2 sprays into both nostrils daily.   Garlic 2376 MG CAPS Take by mouth daily. (Patient not taking: No sig reported)   glucose blood test strip Use 1 strip to check your blood sugar two times daily.   hydrochlorothiazide (HYDRODIURIL) 12.5 MG tablet TAKE 1 TABLET(12.5 MG) BY MOUTH DAILY   HYDROcodone-acetaminophen (NORCO/VICODIN) 5-325 MG tablet Take 1 tablet by mouth every 4 (four) hours as needed for moderate pain.   Hyoscyamine Sulfate SL (LEVSIN/SL) 0.125 MG SUBL Place 0.125 mg under the tongue 4 (four) times daily as needed.   isosorbide mononitrate (IMDUR) 30 MG 24 hr tablet TAKE 1 TABLET(30 MG) BY MOUTH DAILY   levothyroxine (  SYNTHROID) 75 MCG tablet Take by mouth.   levothyroxine (SYNTHROID, LEVOTHROID) 75 MCG tablet TAKE 1 TABLET BY MOUTH DAILY BEFORE BREAKFAST FOR THYROID   lidocaine (LIDODERM) 5 % Place 1 patch onto the skin every 12 (twelve) hours. Remove & Discard patch within 12 hours or as directed by MD.  [place] Proud the patch off for 12 hours before applying a new one.   meloxicam (MOBIC) 15 MG tablet meloxicam 15 mg tablet  Take 1 tablet every day by oral route.   metoprolol succinate (TOPROL-XL) 25 MG 24 hr tablet TAKE 1 TABLET(25 MG) BY MOUTH DAILY   Multiple Vitamins-Minerals (PRESERVISION AREDS 2 PO) Take by mouth.   nitroGLYCERIN (NITROSTAT) 0.4 MG SL tablet Place 1 tablet (0.4 mg total) under the tongue  every 5 (five) minutes as needed for chest pain.   Omega-3 1000 MG CAPS Take 1 capsule by mouth daily.    omeprazole (PRILOSEC) 40 MG capsule Take 1 capsule by mouth daily.   pantoprazole (PROTONIX) 20 MG tablet Take 1 tablet (20 mg total) by mouth daily. FOR ACID REFLUX AND STOMACH PAIN   pantoprazole (PROTONIX) 40 MG tablet Take by mouth.   polyethylene glycol powder (GLYCOLAX/MIRALAX) 17 GM/SCOOP powder polyethylene glycol 3350 17 gram/dose oral powder  TK  PO ONCE FOR 1 DOSE   predniSONE (DELTASONE) 10 MG tablet Take 1 tablet (10 mg total) by mouth daily. Day 1-3: take 4 tablets PO daily Day 4-6: take 3 tablets PO daily Day 7-9: take 2 tablets PO daily Day 10-12: take 1 tablet PO daily   rosuvastatin (CRESTOR) 40 MG tablet Take 40 mg by mouth daily.   tiZANidine (ZANAFLEX) 2 MG tablet Take 2 mg by mouth 3 (three) times daily.   traMADol (ULTRAM) 50 MG tablet Take 1 tablet by mouth every 6 (six) hours as needed.   No facility-administered encounter medications on file as of 03/19/2022.    Social History: Social History   Tobacco Use   Smoking status: Never   Smokeless tobacco: Former    Types: Nurse, children's Use: Never used  Substance Use Topics   Alcohol use: No   Drug use: No    Family Medical History: Family History  Problem Relation Age of Onset   Breast cancer Maternal Aunt 83   Diabetes Mother    Leukemia Mother    Heart Problems Mother    Diabetes Sister    Mental illness Sister    Colon polyps Sister    Stroke Father    Cancer Brother        rectal   Healthy Daughter    Hypertension Son    Diabetes Sister    Thyroid disease Sister    Healthy Sister    Healthy Sister    Stroke Brother    Healthy Son    Healthy Son    Healthy Daughter     Physical Examination: There were no vitals filed for this visit.  General: Patient is well developed, well nourished, calm, collected, and in no apparent distress. Attention to examination is  appropriate.  Respiratory: Patient is breathing without any difficulty.   NEUROLOGICAL:     Awake, alert, oriented to person, place, and time.  Speech is clear and fluent. Fund of knowledge is appropriate.   Cranial Nerves: Pupils equal round and reactive to light.  Facial tone is symmetric.  Facial sensation is symmetric.  ROM of spine:  *** ROM of cervical spine *** pain *** ROM of  lumbar spine *** pain  No abnormal lesions on exposed skin.   Strength: Side Biceps Triceps Deltoid Interossei Grip Wrist Ext. Wrist Flex.  R _0 L _1 Side Iliopsoas Quads Hamstring PF DF EHL  R _2 L _3 Reflexes are ***2+ and symmetric at the biceps, triceps, brachioradialis, patella and achilles.   Hoffman's is absent.  Clonus is not present.   Bilateral upper and lower extremity sensation is intact to light touch.    No evidence of dysmetria noted.  Gait is normal.   ***No difficulty with tandem gait.    Medical Decision Making  Imaging: MRI cervical spine 09/08/21:  FINDINGS: Alignment: Stable residual cervical lordosis. Subtle anterolisthesis of C4 on C5 is stable from last year.   Vertebrae: Chronic C3-C4 ACDF with solid arthrodesis. Mild hardware susceptibility artifact. No marrow edema or evidence of acute osseous abnormality. Normal background bone marrow signal.   Cord: No spinal cord signal abnormality despite some evidence of degenerative cord mass effect detailed below.   Posterior Fossa, vertebral arteries, paraspinal tissues: Cervicomedullary junction is within normal limits. Negative visible posterior fossa. Preserved major vascular flow voids in the neck. Asymmetric partially retropharyngeal course of the right carotid. Negative visible neck soft tissues and lung apices.   Disc levels:   C2-C3: Mild-to-moderate facet hypertrophy greater on the right. Mild ligament flavum hypertrophy. Mild left and moderate right  C3 foraminal stenosis are stable.   C3-C4:  Chronic ACDF with no adverse features.   C4-C5: Chronic moderate to severe facet and ligament flavum hypertrophy greater on the right. Circumferential disc bulge and endplate spurring. Mild spinal stenosis with mild if any cord mass effect appears stable. Stable mild left but severe right C5 foraminal stenosis.   C5-C6: Moderate facet and ligament flavum hypertrophy greater on the left. Disc space loss with circumferential disc osteophyte complex. Up to mild spinal stenosis and spinal cord mass effect (series 19, image 18). Moderate left greater than right C6 foraminal stenosis. This level appears mildly progressed since last year.   C6-C7: Circumferential disc bulge with up to moderate facet and ligament flavum hypertrophy greater on the left. No significant spinal or right foraminal stenosis. Mild left C7 foraminal stenosis. This level stable.   C7-T1: Mild and mostly anterior disc bulging. Mild facet hypertrophy. No stenosis.   Thoracic spine is reported separately today.   IMPRESSION: 1. Chronic C3-C4 ACDF with no adverse features.   2. 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   3. But similar multifactorial C5-C6 degeneration appears mildly progressed, with mild spinal stenosis and moderate left > right C6 neural foraminal stenosis.   4. Up to mild associated spinal cord mass effect but no cord signal abnormality.     Electronically Signed   By: Genevie Ann M.D.   On: 09/10/2021 07:21  MRI of thoracic spine dated 09/08/21:  FINDINGS: Limited cervical spine imaging:  Reported separately today.   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   Alignment: Mild dextroconvex thoracic scoliosis.  Stable mildly exaggerated midthoracic kyphosis. No significant thoracic spondylolisthesis.   Vertebrae: Advanced chronic endplate degeneration at T6-T7 and T7-T8. Patchy mild associated  vertebral body marrow edema at those levels. Chronic severe disc space loss at those levels but no convincing interbody arthrodesis.   Normal background bone marrow signal. No other thoracic marrow edema or evidence of acute osseous abnormality.   Cord: Normal. Capacious thoracic spinal canal at most levels, see details below. Normal conus medullaris visible at L1-L2.   Paraspinal and other soft tissues: Negative.   Disc levels:   T1-T2: Mild facet hypertrophy.  No stenosis.   T2-T3: Moderate bilateral facet hypertrophy. Mild disc bulging. No spinal stenosis. Mild to moderate bilateral T2 foraminal stenosis greater on the right.   T3-T4: Moderate bilateral facet hypertrophy. Negative disc. No spinal stenosis. Mild to moderate bilateral T3 neural foraminal stenosis.   T4-T5: Mild left and moderate right facet hypertrophy. Mild to moderate right T4 foraminal stenosis.   T5-T6: Mild disc bulging. Moderate right facet hypertrophy. Mild to moderate right T5 foraminal stenosis.   T6-T7: Chronic severe disc space loss with circumferential disc osteophyte complex eccentric to the left. Effaced ventral CSF space but no significant spinal stenosis (series 20, image 20). Mild left T6 foraminal stenosis.   T7-T8: Similar chronic advanced disc space loss. Circumferential disc osteophyte complex. Broad-based posterior and foraminal involvement. Effaced ventral CSF space but no significant spinal stenosis (series 20, image 23). Mild facet hypertrophy and mild bilateral T7 foraminal stenosis.   T8-T9: Lesser disc space loss with circumferential disc bulge. Mild facet hypertrophy. No spinal stenosis. But moderate to severe left T8 neural foraminal stenosis (series 17, image 13).   T9-T10: Mild  circumferential disc bulge. Mild facet hypertrophy. No significant stenosis.   T10-T11: Mild circumferential disc bulge. Moderate facet hypertrophy greater on the left. No spinal stenosis. Mild to moderate left T10 foraminal stenosis.   T11-T12: Subtle disc bulging. Moderate bilateral facet hypertrophy. Mild left but moderate to severe right T11 foraminal stenosis (series 17, image 6).   T12-L1: Circumferential disc bulge. Mild to moderate facet hypertrophy greater on the right. No significant stenosis.   No visible upper thoracic spinal stenosis.   IMPRESSION: 1. Advanced chronic disc and endplate degeneration at T6-T7 and T7-T8 with patchy associated vertebral body marrow edema. But no other acute osseous abnormality in the thoracic spine. Superimposed widespread thoracic facet degeneration and hypertrophy.   2. No significant thoracic spinal stenosis. But there is multilevel thoracic neural foraminal stenosis, up to severe at the left T8 and right T11 nerve levels, and mild to moderate elsewhere.     Electronically Signed   By: Genevie Ann M.D.   On: 09/10/2021 07:30   I have personally reviewed the images and agree with the above interpretation.  Assessment and Plan: [name] is a pleasant 81 y.o. female with ***  Treatment options discussed with patient and following plan made:   - Order for physical therapy for *** spine ***. - Continue on current medications including ***. Reviewed proper dosing along with risks and benefits. Take and NSAIDs with food.      I spent a total of *** minutes in face-to-face and non-face-to-face activities related to this patient's care today including review of outside records, review of imaging, review of symptoms, physical exam, discussion of differential diagnosis, discussion of treatment options, and documentation.   Thank you for involving me in the care of this patient.   Geronimo Boot PA-C Dept. of Neurosurgery

## 2022-03-18 NOTE — Progress Notes (Deleted)
Referring Physician:  Dionisio David, MD [address] [place],  [place] [address]  Primary Physician:  Dionisio David, MD  History of Present Illness: 03/18/2022*** [name] has a history of DM, GERD, hyperlipidemia, HTN, migraines, nonischemic cardiolmyopathy, and thyroid disease.   She was last seen by Danielle on 08/21/21 for her lumbar spine. Lumbar MRI from 06/19/21 showed moderate L4-L5 central stenosis and slip L3-L3.   MRI of thoracic and cervical spine ordered as she had a positive hoffmans and hyper-reflexia.   She was referred to pain management Holley Raring) for consideration of SCS and evaluation of chronic pain.   She is here for follow up.     Duration: *** Location: *** Quality: *** Severity: ***  Precipitating: aggravated by *** Modifying factors: made better by *** Weakness: none Timing: *** Bowel/Bladder Dysfunction: none  Conservative measures:  Physical therapy: doing PT at [facility] (last visit 01/22/22?***) Multimodal medical therapy including regular antiinflammatories: tylenol, meloxicam, tizanidine, tramadol Injections: 03/16/22 left L4-L5 and left L5-S1 TF ESI 11/21/21 left L4-L5 and left L5-S1 TF ESI 11/21/20: Left L5-S1 ESI 07/11/20: Left L4-5 ESI  06/04/20: Left L5-S1 ESI 11/24/2019: Bilateral T10 and L5 trigger point injections 07/25/2019: Left greater trochanteric bursa injection (good relief x2 weeks) 04/26/2019: Left greater trochanteric bursa injection (good relief for 2 months) 01/24/2019: Left greater trochanteric bursa injection (approximately 3 months of good relief) 10/24/2018: Left greater trochanteric bursa injection (good relief) 09/20/2018: Left L5-S1 ESI (good relief) 04/20/2018: Left L5-S1 ESI (good relief) 01/25/2018: Left L5-S1 ESI (good relief) 01/03/2018: Bilateral levator scapula trigger point injection (moderate to good relief) 06/29/2017: Left L5-S1 ESI (good relief) 04/19/2017: Bilateral levator scapula trigger point  injection (good relief) 01/27/2017: Left L5-S1 transforaminal ESI (good relief) 12/22/2016: Right T9, T10, T11 trigger point point injection (good relief) 08/31/2016: Right T8, T10, T12 trigger point injections (good relief times 5 weeks) 06/25/2016: Left L5-S1 ESI (good relief) 03/12/2016: Bilateral ESI (no relief) 08/20/2015: Bilateral trochanteric bursa injections (sustained left-sided relief, 5 weeks for relief of right-sided pain) 04/29/2015: Right shoulder injection (Dr. Angelene Giovanni) 10/30/2014: Left trochanteric bursa injection (good relief) 02/14/2014: Left shoulder injection (April Bernt, good relief)   Past Surgery:  C3-4 ACDF at [facility] in 2001 Lumbar surgery at [facility] in 2001   [place] has ***no symptoms of cervical myelopathy.  The symptoms are causing a significant impact on the patient's life.   Review of Systems:  A 10 point review of systems is negative, except for the pertinent positives and negatives detailed in the HPI.  Past Medical History: Past Medical History:  Diagnosis Date   Allergy    Anxiety    CHF (congestive heart failure) (HCC)    Colon polyp    Depression    GERD (gastroesophageal reflux disease)    Heart murmur    Hyperlipidemia    Hypertension    Hypothyroidism    Sleep apnea    Thyroid disease     Past Surgical History: Past Surgical History:  Procedure Laterality Date   ABDOMINAL HYSTERECTOMY     BACK SURGERY     BACK SURGERY     CERVICAL FUSION     COLONOSCOPY WITH PROPOFOL N/A 05/28/2017   Procedure: COLONOSCOPY WITH PROPOFOL;  Surgeon: Jonathon Bellows, MD;  Location: [facility] ENDOSCOPY;  Service: Gastroenterology;  Laterality: N/A;   COLONOSCOPY WITH PROPOFOL N/A 11/17/2019   Procedure: COLONOSCOPY WITH PROPOFOL;  Surgeon: Lesly Rubenstein, MD;  Location: [facility] ENDOSCOPY;  Service: Endoscopy;  Laterality: N/A;  ESOPHAGOGASTRODUODENOSCOPY (EGD) WITH PROPOFOL N/A 11/17/2019   Procedure: ESOPHAGOGASTRODUODENOSCOPY (EGD) WITH PROPOFOL;   Surgeon: Lesly Rubenstein, MD;  Location: [facility] ENDOSCOPY;  Service: Endoscopy;  Laterality: N/A;   NECK SURGERY     THYROID SURGERY     THYROID SURGERY      Allergies: Allergies as of 04/01/2022 - Review Complete 02/03/2022  Allergen Reaction Noted   Amlodipine Hives and Swelling 12/24/2014   Tramadol Other (See Comments) 06/14/2019   Codeine Nausea Only 09/11/2020   Etodolac Other (See Comments) 04/22/2016    Medications: Outpatient Encounter Medications as of 04/01/2022  Medication Sig   acetaminophen (TYLENOL) 325 MG tablet Take 2 tablets (650 mg total) by mouth every 6 (six) hours as needed.   albuterol (PROVENTIL HFA;VENTOLIN HFA) 108 (90 Base) MCG/ACT inhaler Inhale into the lungs every 6 (six) hours as needed for wheezing or shortness of breath.   amLODipine (NORVASC) 5 MG tablet TAKE 1 TABLET(5 MG) BY MOUTH DAILY FOR BLOOD PRESSURE   aspirin 81 MG chewable tablet Chew 81 mg by mouth daily.    atenolol (TENORMIN) 50 MG tablet Take 1 tablet by mouth daily.   atorvastatin (LIPITOR) 20 MG tablet Take 1 tablet by mouth daily.   Azelastine HCl 137 MCG/SPRAY SOLN SMARTSIG:1-2 Spray(s) Both Nares Twice Daily   Baclofen 5 MG TABS Take by mouth at bedtime.   betamethasone dipropionate 0.05 % cream Apply topically.   Blood Glucose Monitoring Suppl (ACCU-CHEK GUIDE) w/Device KIT See admin instructions.   calcium carbonate (OS-CAL) 600 MG TABS tablet Take 600 mg by mouth 2 (two) times daily with a meal.   carvedilol (COREG) 12.5 MG tablet Take 12.5 mg by mouth 2 (two) times daily.   cefdinir (OMNICEF) 300 MG capsule Take by mouth.   cetirizine (ZYRTEC) 10 MG tablet Take 1 tablet by mouth daily.   cyclobenzaprine (FLEXERIL) 5 MG tablet Take 1 tablet (5 mg total) by mouth 3 (three) times daily as needed for muscle spasms.   diclofenac Sodium (VOLTAREN) 1 % GEL Apply 4 g topically 4 (four) times daily.   dicyclomine (BENTYL) 10 MG capsule Take 1 capsule by mouth 3 (three) times daily as  needed.   ENTRESTO 24-26 MG Take 1 tablet by mouth 2 (two) times daily.   ergocalciferol (VITAMIN D2) 1.25 MG (50000 UT) capsule ergocalciferol (vitamin D2) 1,250 mcg (50,000 unit) capsule  TK 1 C PO 1 TIME A WK   escitalopram (LEXAPRO) 10 MG tablet TAKE 1 TABLET BY MOUTH EVERY DAY FOR DEPRESSION   famotidine (PEPCID) 20 MG tablet Take 1 tablet (20 mg total) by mouth 2 (two) times daily.   Flaxseed, Linseed, (FLAX SEED OIL) 1000 MG CAPS Take by mouth.   FLOVENT HFA 44 MCG/ACT inhaler SMARTSIG:2 Puff(s) By Mouth Twice Daily   fluticasone (FLONASE) 50 MCG/ACT nasal spray Place 2 sprays into both nostrils daily.   Garlic 2119 MG CAPS Take by mouth daily. (Patient not taking: No sig reported)   glucose blood test strip Use 1 strip to check your blood sugar two times daily.   hydrochlorothiazide (HYDRODIURIL) 12.5 MG tablet TAKE 1 TABLET(12.5 MG) BY MOUTH DAILY   HYDROcodone-acetaminophen (NORCO/VICODIN) 5-325 MG tablet Take 1 tablet by mouth every 4 (four) hours as needed for moderate pain.   Hyoscyamine Sulfate SL (LEVSIN/SL) 0.125 MG SUBL Place 0.125 mg under the tongue 4 (four) times daily as needed.   isosorbide mononitrate (IMDUR) 30 MG 24 hr tablet TAKE 1 TABLET(30 MG) BY MOUTH DAILY   levothyroxine (  SYNTHROID) 75 MCG tablet Take by mouth.   levothyroxine (SYNTHROID, LEVOTHROID) 75 MCG tablet TAKE 1 TABLET BY MOUTH DAILY BEFORE BREAKFAST FOR THYROID   lidocaine (LIDODERM) 5 % Place 1 patch onto the skin every 12 (twelve) hours. Remove & Discard patch within 12 hours or as directed by MD.  [place] Proud the patch off for 12 hours before applying a new one.   meloxicam (MOBIC) 15 MG tablet meloxicam 15 mg tablet  Take 1 tablet every day by oral route.   metoprolol succinate (TOPROL-XL) 25 MG 24 hr tablet TAKE 1 TABLET(25 MG) BY MOUTH DAILY   Multiple Vitamins-Minerals (PRESERVISION AREDS 2 PO) Take by mouth.   nitroGLYCERIN (NITROSTAT) 0.4 MG SL tablet Place 1 tablet (0.4 mg total) under the tongue  every 5 (five) minutes as needed for chest pain.   Omega-3 1000 MG CAPS Take 1 capsule by mouth daily.    omeprazole (PRILOSEC) 40 MG capsule Take 1 capsule by mouth daily.   pantoprazole (PROTONIX) 20 MG tablet Take 1 tablet (20 mg total) by mouth daily. FOR ACID REFLUX AND STOMACH PAIN   pantoprazole (PROTONIX) 40 MG tablet Take by mouth.   polyethylene glycol powder (GLYCOLAX/MIRALAX) 17 GM/SCOOP powder polyethylene glycol 3350 17 gram/dose oral powder  TK  PO ONCE FOR 1 DOSE   predniSONE (DELTASONE) 10 MG tablet Take 1 tablet (10 mg total) by mouth daily. Day 1-3: take 4 tablets PO daily Day 4-6: take 3 tablets PO daily Day 7-9: take 2 tablets PO daily Day 10-12: take 1 tablet PO daily   rosuvastatin (CRESTOR) 40 MG tablet Take 40 mg by mouth daily.   tiZANidine (ZANAFLEX) 2 MG tablet Take 2 mg by mouth 3 (three) times daily.   traMADol (ULTRAM) 50 MG tablet Take 1 tablet by mouth every 6 (six) hours as needed.   No facility-administered encounter medications on file as of 04/01/2022.    Social History: Social History   Tobacco Use   Smoking status: Never   Smokeless tobacco: Former    Types: Nurse, children's Use: Never used  Substance Use Topics   Alcohol use: No   Drug use: No    Family Medical History: Family History  Problem Relation Age of Onset   Breast cancer Maternal Aunt 28   Diabetes Mother    Leukemia Mother    Heart Problems Mother    Diabetes Sister    Mental illness Sister    Colon polyps Sister    Stroke Father    Cancer Brother        rectal   Healthy Daughter    Hypertension Son    Diabetes Sister    Thyroid disease Sister    Healthy Sister    Healthy Sister    Stroke Brother    Healthy Son    Healthy Son    Healthy Daughter     Physical Examination: There were no vitals filed for this visit.  General: Patient is well developed, well nourished, calm, collected, and in no apparent distress. Attention to examination is  appropriate.  Respiratory: Patient is breathing without any difficulty.   NEUROLOGICAL:     Awake, alert, oriented to person, place, and time.  Speech is clear and fluent. Fund of knowledge is appropriate.   Cranial Nerves: Pupils equal round and reactive to light.  Facial tone is symmetric.  Facial sensation is symmetric.  ROM of spine:  *** ROM of cervical spine *** pain *** ROM of  lumbar spine *** pain  No abnormal lesions on exposed skin.   Strength: Side Biceps Triceps Deltoid Interossei Grip Wrist Ext. Wrist Flex.  R _0 L _1 Side Iliopsoas Quads Hamstring PF DF EHL  R _2 L _3 Reflexes are ***2+ and symmetric at the biceps, triceps, brachioradialis, patella and achilles.   Hoffman's is absent.  Clonus is not present.   Bilateral upper and lower extremity sensation is intact to light touch.    No evidence of dysmetria noted.  Gait is normal.   ***No difficulty with tandem gait.    Medical Decision Making  Imaging: MRI cervical spine 09/08/21:  FINDINGS: Alignment: Stable residual cervical lordosis. Subtle anterolisthesis of C4 on C5 is stable from last year.   Vertebrae: Chronic C3-C4 ACDF with solid arthrodesis. Mild hardware susceptibility artifact. No marrow edema or evidence of acute osseous abnormality. Normal background bone marrow signal.   Cord: No spinal cord signal abnormality despite some evidence of degenerative cord mass effect detailed below.   Posterior Fossa, vertebral arteries, paraspinal tissues: Cervicomedullary junction is within normal limits. Negative visible posterior fossa. Preserved major vascular flow voids in the neck. Asymmetric partially retropharyngeal course of the right carotid. Negative visible neck soft tissues and lung apices.   Disc levels:   C2-C3: Mild-to-moderate facet hypertrophy greater on the right. Mild ligament flavum hypertrophy. Mild left and moderate right  C3 foraminal stenosis are stable.   C3-C4:  Chronic ACDF with no adverse features.   C4-C5: Chronic moderate to severe facet and ligament flavum hypertrophy greater on the right. Circumferential disc bulge and endplate spurring. Mild spinal stenosis with mild if any cord mass effect appears stable. Stable mild left but severe right C5 foraminal stenosis.   C5-C6: Moderate facet and ligament flavum hypertrophy greater on the left. Disc space loss with circumferential disc osteophyte complex. Up to mild spinal stenosis and spinal cord mass effect (series 19, image 18). Moderate left greater than right C6 foraminal stenosis. This level appears mildly progressed since last year.   C6-C7: Circumferential disc bulge with up to moderate facet and ligament flavum hypertrophy greater on the left. No significant spinal or right foraminal stenosis. Mild left C7 foraminal stenosis. This level stable.   C7-T1: Mild and mostly anterior disc bulging. Mild facet hypertrophy. No stenosis.   Thoracic spine is reported separately today.   IMPRESSION: 1. Chronic C3-C4 ACDF with no adverse features.   2. 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   3. But similar multifactorial C5-C6 degeneration appears mildly progressed, with mild spinal stenosis and moderate left > right C6 neural foraminal stenosis.   4. Up to mild associated spinal cord mass effect but no cord signal abnormality.     Electronically Signed   By: Genevie Ann M.D.   On: 09/10/2021 07:21  MRI of thoracic spine dated 09/08/21:  FINDINGS: Limited cervical spine imaging:  Reported separately today.   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   Alignment: Mild dextroconvex thoracic scoliosis.  Stable mildly exaggerated midthoracic kyphosis. No significant thoracic spondylolisthesis.   Vertebrae: Advanced chronic endplate degeneration at T6-T7 and T7-T8. Patchy mild associated  vertebral body marrow edema at those levels. Chronic severe disc space loss at those levels but no convincing interbody arthrodesis.   Normal background bone marrow signal. No other thoracic marrow edema or evidence of acute osseous abnormality.   Cord: Normal. Capacious thoracic spinal canal at most levels, see details below. Normal conus medullaris visible at L1-L2.   Paraspinal and other soft tissues: Negative.   Disc levels:   T1-T2: Mild facet hypertrophy.  No stenosis.   T2-T3: Moderate bilateral facet hypertrophy. Mild disc bulging. No spinal stenosis. Mild to moderate bilateral T2 foraminal stenosis greater on the right.   T3-T4: Moderate bilateral facet hypertrophy. Negative disc. No spinal stenosis. Mild to moderate bilateral T3 neural foraminal stenosis.   T4-T5: Mild left and moderate right facet hypertrophy. Mild to moderate right T4 foraminal stenosis.   T5-T6: Mild disc bulging. Moderate right facet hypertrophy. Mild to moderate right T5 foraminal stenosis.   T6-T7: Chronic severe disc space loss with circumferential disc osteophyte complex eccentric to the left. Effaced ventral CSF space but no significant spinal stenosis (series 20, image 20). Mild left T6 foraminal stenosis.   T7-T8: Similar chronic advanced disc space loss. Circumferential disc osteophyte complex. Broad-based posterior and foraminal involvement. Effaced ventral CSF space but no significant spinal stenosis (series 20, image 23). Mild facet hypertrophy and mild bilateral T7 foraminal stenosis.   T8-T9: Lesser disc space loss with circumferential disc bulge. Mild facet hypertrophy. No spinal stenosis. But moderate to severe left T8 neural foraminal stenosis (series 17, image 13).   T9-T10: Mild  circumferential disc bulge. Mild facet hypertrophy. No significant stenosis.   T10-T11: Mild circumferential disc bulge. Moderate facet hypertrophy greater on the left. No spinal stenosis. Mild to moderate left T10 foraminal stenosis.   T11-T12: Subtle disc bulging. Moderate bilateral facet hypertrophy. Mild left but moderate to severe right T11 foraminal stenosis (series 17, image 6).   T12-L1: Circumferential disc bulge. Mild to moderate facet hypertrophy greater on the right. No significant stenosis.   No visible upper thoracic spinal stenosis.   IMPRESSION: 1. Advanced chronic disc and endplate degeneration at T6-T7 and T7-T8 with patchy associated vertebral body marrow edema. But no other acute osseous abnormality in the thoracic spine. Superimposed widespread thoracic facet degeneration and hypertrophy.   2. No significant thoracic spinal stenosis. But there is multilevel thoracic neural foraminal stenosis, up to severe at the left T8 and right T11 nerve levels, and mild to moderate elsewhere.     Electronically Signed   By: Genevie Ann M.D.   On: 09/10/2021 07:30   I have personally reviewed the images and agree with the above interpretation.  Assessment and Plan: Ms. Mis is a pleasant 81 y.o. female with ***  Treatment options discussed with patient and following plan made:   - Order for physical therapy for *** spine ***. - Continue on current medications including ***. Reviewed proper dosing along with risks and benefits. Take and NSAIDs with food.      I spent a total of *** minutes in face-to-face and non-face-to-face activities related to this patient's care today including review of outside records, review of imaging, review of symptoms, physical exam, discussion of differential diagnosis, discussion of treatment options, and documentation.   Thank you for involving me in the care of this patient.   Geronimo Boot PA-C Dept. of Neurosurgery

## 2022-03-19 ENCOUNTER — Ambulatory Visit: Payer: Medicare Other | Admitting: Orthopedic Surgery

## 2022-03-30 DIAGNOSIS — S22080A Wedge compression fracture of T11-T12 vertebra, initial encounter for closed fracture: Secondary | ICD-10-CM

## 2022-03-30 HISTORY — DX: Wedge compression fracture of T11-T12 vertebra, initial encounter for closed fracture: S22.080A

## 2022-04-01 ENCOUNTER — Ambulatory Visit: Payer: Medicare Other | Admitting: Orthopedic Surgery

## 2022-04-16 ENCOUNTER — Ambulatory Visit: Payer: Medicare Other | Admitting: Psychology

## 2022-04-24 ENCOUNTER — Encounter: Payer: Self-pay | Admitting: Emergency Medicine

## 2022-04-24 ENCOUNTER — Emergency Department: Payer: 59

## 2022-04-24 ENCOUNTER — Emergency Department
Admission: EM | Admit: 2022-04-24 | Discharge: 2022-04-24 | Disposition: A | Discharge status: Home / Self Care | Payer: 59 | Attending: Emergency Medicine | Admitting: Emergency Medicine

## 2022-04-24 ENCOUNTER — Other Ambulatory Visit: Payer: Self-pay

## 2022-04-24 DIAGNOSIS — E039 Hypothyroidism, unspecified: Secondary | ICD-10-CM | POA: Diagnosis not present

## 2022-04-24 DIAGNOSIS — E119 Type 2 diabetes mellitus without complications: Secondary | ICD-10-CM | POA: Diagnosis not present

## 2022-04-24 DIAGNOSIS — I10 Essential (primary) hypertension: Secondary | ICD-10-CM | POA: Diagnosis not present

## 2022-04-24 DIAGNOSIS — R109 Unspecified abdominal pain: Secondary | ICD-10-CM | POA: Insufficient documentation

## 2022-04-24 DIAGNOSIS — J4520 Mild intermittent asthma, uncomplicated: Secondary | ICD-10-CM | POA: Diagnosis not present

## 2022-04-24 DIAGNOSIS — M79662 Pain in left lower leg: Secondary | ICD-10-CM | POA: Diagnosis present

## 2022-04-24 DIAGNOSIS — M79605 Pain in left leg: Secondary | ICD-10-CM

## 2022-04-24 LAB — CBC
HCT: 38.3 % (ref 36.0–46.0)
Hemoglobin: 11.9 g/dL — ABNORMAL LOW (ref 12.0–15.0)
MCH: 24.1 pg — ABNORMAL LOW (ref 26.0–34.0)
MCHC: 31.1 g/dL (ref 30.0–36.0)
MCV: 77.7 fL — ABNORMAL LOW (ref 80.0–100.0)
Platelets: 247 10*3/uL (ref 150–400)
RBC: 4.93 MIL/uL (ref 3.87–5.11)
RDW: 16.2 % — ABNORMAL HIGH (ref 11.5–15.5)
WBC: 7.1 10*3/uL (ref 4.0–10.5)
nRBC: 0 % (ref 0.0–0.2)

## 2022-04-24 LAB — URINALYSIS, ROUTINE W REFLEX MICROSCOPIC
Bacteria, UA: NONE SEEN
Bilirubin Urine: NEGATIVE
Glucose, UA: >=500 mg/dL — AB
Hgb urine dipstick: NEGATIVE
Ketones, ur: NEGATIVE mg/dL
Leukocytes,Ua: NEGATIVE
Nitrite: NEGATIVE
Protein, ur: NEGATIVE mg/dL
Specific Gravity, Urine: 1.029 (ref 1.005–1.030)
pH: 5 (ref 5.0–8.0)

## 2022-04-24 LAB — COMPREHENSIVE METABOLIC PANEL
ALT: 17 U/L (ref 0–44)
AST: 22 U/L (ref 15–41)
Albumin: 3.8 g/dL (ref 3.5–5.0)
Alkaline Phosphatase: 72 U/L (ref 38–126)
Anion gap: 11 (ref 5–15)
BUN: 21 mg/dL (ref 8–23)
CO2: 22 mmol/L (ref 22–32)
Calcium: 9 mg/dL (ref 8.9–10.3)
Chloride: 108 mmol/L (ref 98–111)
Creatinine, Ser: 0.71 mg/dL (ref 0.44–1.00)
GFR, Estimated: >60 mL/min (ref >60)
Glucose, Bld: 135 mg/dL — ABNORMAL HIGH (ref 70–99)
Potassium: 3.2 mmol/L — ABNORMAL LOW (ref 3.5–5.1)
Sodium: 141 mmol/L (ref 135–145)
Total Bilirubin: 0.7 mg/dL (ref 0.3–1.2)
Total Protein: 7.4 g/dL (ref 6.5–8.1)

## 2022-04-24 LAB — LIPASE, BLOOD: Lipase: 28 U/L (ref 11–51)

## 2022-04-24 MED ORDER — IOHEXOL 300 MG/ML  SOLN
100.0000 mL | Freq: Once | INTRAMUSCULAR | Status: AC | PRN
  Administered 2022-04-24: 100 mL via INTRAVENOUS

## 2022-04-24 NOTE — Discharge Instructions (Signed)
Your labs, CT scan, and ultrasound are normal.  Please return for any new, worsening, or change in symptoms or other concerns.  Please follow with your outpatient provider this week.  It was a pleasure caring for you today.

## 2022-04-24 NOTE — ED Notes (Signed)
Patient transported to CT 

## 2022-04-24 NOTE — ED Notes (Signed)
Pt arrived with complaint of left sided leg pain that radiates to her lower abdomen. Pt states she hasn't had this before. Pt reports the pain is worse when lying down. Pt denies hx of blood clots and only takes a baby asa. Pt denies CP or SOB.

## 2022-04-24 NOTE — ED Triage Notes (Signed)
Pt comes with c/o left belly pain. Pt states this all started last night. Pt states some pain in low back and down leg. Pt states denies any N/V

## 2022-04-24 NOTE — ED Provider Notes (Signed)
[facility] Provider Note    Event Date/Time   First MD Initiated Contact with Patient 04/24/22 1117     (approximate)   History   Abdominal Pain   HPI  Helen Mccormick is a 82 y.o. female who presents today for evaluation of left sided abdominal pain and calf pain that has been ongoing since Monday.  She reports that her belly pain has resolved but she has pain in her calf.  She reports that the pain radiates from her back down into her back of her leg into her calf.  She denies history of blood clots.  She has not had any fevers or chills.  No nausea or vomiting.  No chest pain or shortness of breath.  She denies taking anticoagulation.  Patient Active Problem List   Diagnosis Date Noted   Allergic rhinitis 01/07/2021   Chronic allergic conjunctivitis 06/11/2020   Mild intermittent asthma 06/11/2020   Allergic rhinitis due to animal (cat) (dog) hair and dander 05/03/2020   Allergic rhinitis due to pollen 05/03/2020   Gastro-esophageal reflux disease without esophagitis 05/03/2020   Nonischemic cardiomyopathy ([place]) 09/27/2019   Obstructive sleep apnea syndrome 12/15/2018   Microcytic anemia 06/29/2018   Gastritis 06/21/2018   Macular degeneration of both eyes 10/13/2017   Impingement syndrome of right shoulder region 09/10/2017   Chest pain of uncertain etiology 29/52/8413   Fracture of distal end of radius 06/03/2017   Hypertension 03/24/2017   DM (diabetes mellitus), type 2 ([place]) 03/24/2017   Hypothyroidism, 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      Excl. in [place]?     Most recent vital signs: Vitals:   04/24/22 0927 04/24/22 1359  BP: (!) 138/47 (!) 130/50  Pulse: 69 70  Resp: 18 18  Temp: 98 F (36.7 C)   SpO2: 97% 98%    Physical Exam Vitals and nursing note reviewed.  Constitutional:      General: Awake and alert. No acute distress.    Appearance: Normal appearance. The patient is normal weight.  HENT:     Head: Normocephalic and atraumatic.     Mouth: Mucous membranes are moist.  Eyes:     General: PERRL. Normal EOMs        Right eye: No discharge.        Left eye: No discharge.     Conjunctiva/sclera: Conjunctivae normal.  Cardiovascular:     Rate and Rhythm: Normal rate and regular rhythm.     Pulses: Normal pulses.  Pulmonary:     Effort: Pulmonary effort is normal. No respiratory distress.     Breath sounds: Normal breath sounds.  Abdominal:  Abdomen is soft. There is no abdominal tenderness. No rebound or guarding. No distention. Musculoskeletal:        General: No swelling. Normal range of motion.     Cervical back: Normal range of motion and neck supple.  Mild tenderness to left calf without swelling or edema.  No skin changes.  Normal distal pulses. Back: No midline tenderness. Strength and sensation 5/5 to bilateral lower extremities. Normal great toe extension against resistance. Normal sensation throughout feet. Normal patellar reflexes.  Positive SLR on the left and negative opposite SLR bilaterally. Negative FABER test Skin:    General: Skin is warm and dry.     Capillary Refill: Capillary refill takes less than 2 seconds.     Findings: No rash.  Neurological:     Mental Status: The patient is awake and alert.      ED Results / Procedures / Treatments   Labs (all labs ordered are listed, but only abnormal results are displayed) Labs Reviewed  COMPREHENSIVE METABOLIC PANEL - Abnormal;  Notable for the following components:      Result Value   Potassium 3.2 (*)    Glucose, Bld 135 (*)    All other components within normal limits  CBC - Abnormal; Notable for the following components:   Hemoglobin 11.9 (*)    MCV 77.7 (*)    MCH 24.1 (*)    RDW 16.2 (*)    All other components within normal limits  URINALYSIS, ROUTINE W REFLEX MICROSCOPIC - Abnormal; Notable for the following components:   Color, Urine YELLOW (*)    APPearance HAZY (*)    Glucose, UA >=500 (*)    All other components within normal limits  LIPASE, BLOOD     EKG     RADIOLOGY I independently reviewed and interpreted imaging and agree with radiologists findings.     PROCEDURES:  Critical Care performed:   Procedures   MEDICATIONS ORDERED IN ED: Medications  iohexol (OMNIPAQUE) 300 MG/ML solution 100 mL (100 mLs Intravenous Contrast Given 04/24/22 1254)     IMPRESSION / MDM / ASSESSMENT AND PLAN / ED COURSE  I reviewed the triage vital signs and the nursing notes.   Differential diagnosis includes, but is not limited to, diverticulitis, DVT, Baker's cyst, gastroenteritis, sciatica/radiculopathy.  Patient is awake and alert, hemodynamically stable and afebrile.  She is nontoxic in appearance.  I reviewed the patient's chart.  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  She has chronic low back pain with left lumbar radiculitis.  Labs obtained are overall reassuring.  Ultrasound is negative for DVT. CT is negative for any acute findings.  I suspect lumbar radiculopathy is most likely etiology of her symptoms today.  She has 5 out of 5 strength with intact sensation to extensor hallucis dorsiflexion and plantarflexion of bilateral lower extremities with normal patellar reflexes bilaterally. No major trauma, no midline tenderness, no history or physical exam findings to suggest cauda  equina syndrome or spinal cord compression. No focal neurological deficits on exam. No constitutional symptoms or history of immunosuppression or IVDA to suggest potential for epidural abscess. Not anticoagulated, no history of bleeding diastasis to suggest risk for epidural hematoma. No chronic steroid use or advanced age or history of malignancy to suggest proclivity towards pathological fracture.  No chest pain, back pain, shortness of breath, neurological deficits, to suggest vascular catastrophe, and pulses are equal in all 4 extremities.  Discussed care instructions and return precautions with patient. Recommended close outpatient follow-up for re-evaluation. Patient agrees with plan of care. Will treat the patient symptomatically as needed for pain control. Will discharge patient to take these medications and return for any worsening or different pain or development of any neurologic symptoms. Educated patient regarding expected time course for back pain to improve and recommended very close outpatient follow-up. She is ambulatory with a steady gait.   Patient's presentation is most consistent with acute complicated illness / injury requiring diagnostic workup.       FINAL CLINICAL IMPRESSION(S) / ED DIAGNOSES   Final diagnoses:  Pain of left lower extremity     Rx / DC Orders   ED Discharge Orders     None        Note:  This document was prepared using Dragon voice recognition software and may include unintentional dictation errors.   Emeline Gins 04/24/22 1412    Harvest Dark, MD 04/24/22 1557

## 2022-04-28 ENCOUNTER — Encounter: Payer: 59 | Attending: Psychology | Admitting: Psychology

## 2022-05-27 DIAGNOSIS — H353132 Nonexudative age-related macular degeneration, bilateral, intermediate dry stage: Secondary | ICD-10-CM | POA: Diagnosis not present

## 2022-05-27 DIAGNOSIS — Z961 Presence of intraocular lens: Secondary | ICD-10-CM | POA: Diagnosis not present

## 2022-05-27 DIAGNOSIS — E119 Type 2 diabetes mellitus without complications: Secondary | ICD-10-CM | POA: Diagnosis not present

## 2022-05-27 DIAGNOSIS — H16223 Keratoconjunctivitis sicca, not specified as Sjogren's, bilateral: Secondary | ICD-10-CM | POA: Diagnosis not present

## 2022-06-01 ENCOUNTER — Ambulatory Visit (INDEPENDENT_AMBULATORY_CARE_PROVIDER_SITE_OTHER): Payer: Medicaid Other | Admitting: Internal Medicine

## 2022-06-01 ENCOUNTER — Encounter: Payer: Self-pay | Admitting: Internal Medicine

## 2022-06-01 VITALS — BP 120/72 | HR 65 | Ht 64.0 in | Wt 169.0 lb

## 2022-06-01 DIAGNOSIS — E782 Mixed hyperlipidemia: Secondary | ICD-10-CM | POA: Diagnosis not present

## 2022-06-01 DIAGNOSIS — E1169 Type 2 diabetes mellitus with other specified complication: Secondary | ICD-10-CM

## 2022-06-01 DIAGNOSIS — I25118 Atherosclerotic heart disease of native coronary artery with other forms of angina pectoris: Secondary | ICD-10-CM | POA: Diagnosis not present

## 2022-06-01 DIAGNOSIS — I10 Essential (primary) hypertension: Secondary | ICD-10-CM | POA: Diagnosis not present

## 2022-06-01 DIAGNOSIS — Z1231 Encounter for screening mammogram for malignant neoplasm of breast: Secondary | ICD-10-CM

## 2022-06-01 LAB — POCT CBG (FASTING - GLUCOSE)-MANUAL ENTRY: Glucose Fasting, POC: 102 mg/dL — AB (ref 70–99)

## 2022-06-01 NOTE — Progress Notes (Signed)
Established Patient Office Visit  Subjective:  Patient ID: Helen Mccormick, female    DOB: Dec 29, 1940  Age: 82 y.o. MRN: [id number]  Chief Complaint  Patient presents with   Follow-up    Abdominal & back pain    Patient comes in for her follow-up today, accompanied by her granddaughter.  She is generally feeling well but has been suffering with chronic neck and back pain.  She is under the care of physiatry and has received epidural steroid injections in her neck as well as in her lower back.   Patient is fasting for blood work today.  Her next mammogram is due in April we will schedule . Will also check her urine microalbumin today.     Past Medical History:  Diagnosis Date   Allergy    Anxiety    CHF (congestive heart failure) (HCC)    Colon polyp    Depression    GERD (gastroesophageal reflux disease)    Heart murmur    Hyperlipidemia    Hypertension    Hypothyroidism    Sleep apnea    Thyroid disease     Social History   Socioeconomic History   Marital status: Single    Spouse name: Not on file   Number of children: Not on file   Years of education: Not on file   Highest education level: 10th grade  Occupational History   Occupation: retired  Tobacco Use   Smoking status: Never   Smokeless tobacco: Former    Types: Nurse, children's Use: Never used  Substance and Sexual Activity   Alcohol use: No   Drug use: No   Sexual activity: Not Currently  Other Topics Concern   Not on file  Social History Narrative   Not on file   Social Determinants of Health   Financial Resource Strain: Low Risk  (04/27/2017)   Overall Financial Resource Strain (CARDIA)    Difficulty of Paying Living Expenses: Not hard at all  Food Insecurity: No Food Insecurity (04/27/2017)   Hunger Vital Sign    Worried About Running Out of Food in the Last Year: Never true    Ran Out of Food in the Last Year: Never true  Transportation Needs: No Transportation Needs  (04/27/2017)   PRAPARE - Hydrologist (Medical): No    Lack of Transportation (Non-Medical): No  Physical Activity: Sufficiently Active (12/20/2018)   Exercise Vital Sign    Days of Exercise per Week: 4 days    Minutes of Exercise per Session: 60 min  Stress: No Stress Concern Present (03/12/2017)   [place]    Feeling of Stress : Only a little  Social Connections: Moderately Integrated (04/27/2017)   Social Connection and Isolation Panel [NHANES]    Frequency of Communication with Friends and Family: More than three times a week    Frequency of Social Gatherings with Friends and Family: Once a week    Attends Religious Services: More than 4 times per year    Active Member of [employer] or Organizations: Yes    Attends Archivist Meetings: More than 4 times per year    Marital Status: Never married  Intimate Partner Violence: Unknown (03/12/2017)   Humiliation, Afraid, Rape, and Kick questionnaire    Fear of Current or Ex-Partner: Patient refused    Emotionally Abused: Patient refused    Physically Abused: Patient refused  Sexually Abused: Patient refused   Past Surgical History:  Procedure Laterality Date   ABDOMINAL HYSTERECTOMY     BACK SURGERY     BACK SURGERY     CERVICAL FUSION     COLONOSCOPY WITH PROPOFOL N/A 05/28/2017   Procedure: COLONOSCOPY WITH PROPOFOL;  Surgeon: Jonathon Bellows, MD;  Location: [facility] ENDOSCOPY;  Service: Gastroenterology;  Laterality: N/A;   COLONOSCOPY WITH PROPOFOL N/A 11/17/2019   Procedure: COLONOSCOPY WITH PROPOFOL;  Surgeon: Lesly Rubenstein, MD;  Location: [facility] ENDOSCOPY;  Service: Endoscopy;  Laterality: N/A;   ESOPHAGOGASTRODUODENOSCOPY (EGD) WITH PROPOFOL N/A 11/17/2019   Procedure: ESOPHAGOGASTRODUODENOSCOPY (EGD) WITH PROPOFOL;  Surgeon: Lesly Rubenstein, MD;  Location: [facility] ENDOSCOPY;  Service: Endoscopy;  Laterality: N/A;   NECK SURGERY      THYROID SURGERY     THYROID SURGERY      Family History  Problem Relation Age of Onset   Breast cancer Maternal Aunt 81   Diabetes Mother    Leukemia Mother    Heart Problems Mother    Diabetes Sister    Mental illness Sister    Colon polyps Sister    Stroke Father    Cancer Brother        rectal   Healthy Daughter    Hypertension Son    Diabetes Sister    Thyroid disease Sister    Healthy Sister    Healthy Sister    Stroke Brother    Healthy Son    Healthy Son    Healthy Daughter     Allergies  Allergen Reactions   Amlodipine Hives and Swelling   Tramadol Other (See Comments)    Dizziness    Codeine Nausea Only   Etodolac Other (See Comments)    Other reaction(s): Abdominal Pain    Review of Systems  Constitutional:  Negative for chills, diaphoresis, fever, malaise/fatigue and weight loss.  HENT: Negative.    Eyes: Negative.   Respiratory: Negative.    Cardiovascular: Negative.   Gastrointestinal: Negative.   Genitourinary: Negative.   Musculoskeletal:  Positive for back pain, myalgias and neck pain. Negative for falls and joint pain.  Skin:  Negative for rash.  Neurological: Negative.   Psychiatric/Behavioral: Negative.         Objective:   BP 120/72   Pulse 65   Ht '5\' 4"'$  (1.626 m)   Wt 169 lb (76.7 kg)   LMP  (LMP Unknown)   SpO2 95%   BMI 29.01 kg/m   Vitals:   06/01/22 1008  BP: 120/72  Pulse: 65  Height: '5\' 4"'$  (1.626 m)  Weight: 169 lb (76.7 kg)  SpO2: 95%  BMI (Calculated): 28.99    Physical Exam Constitutional:      Appearance: Normal appearance. She is obese.  HENT:     Head: Normocephalic.  Cardiovascular:     Rate and Rhythm: Normal rate and regular rhythm.  Pulmonary:     Effort: Pulmonary effort is normal.     Breath sounds: Normal breath sounds.  Musculoskeletal:        General: Normal range of motion.     Cervical back: Normal range of motion and neck supple.  Skin:    General: Skin is warm and dry.   Neurological:     General: No focal deficit present.     Mental Status: She is alert and oriented to person, place, and time.      Results for orders placed or performed in visit on 06/01/22  POCT CBG (  Fasting - Glucose)  Result Value Ref Range   Glucose Fasting, POC 102 (A) 70 - 99 mg/dL    Recent Results (from the past 2160 hour(s))  Lipase, blood     Status: None   Collection Time: 04/24/22  9:28 AM  Result Value Ref Range   Lipase 28 11 - 51 U/L    Comment: Performed at [facility], [address] [place], [place] [address]  Comprehensive metabolic panel     Status: Abnormal   Collection Time: 04/24/22  9:28 AM  Result Value Ref Range   Sodium 141 135 - 145 mmol/L   Potassium 3.2 (L) 3.5 - 5.1 mmol/L   Chloride 108 98 - 111 mmol/L   CO2 22 22 - 32 mmol/L   Glucose, Bld 135 (H) 70 - 99 mg/dL    Comment: Glucose reference range applies only to samples taken after fasting for at least 8 hours.   BUN 21 8 - 23 mg/dL   Creatinine, Ser 0.71 0.44 - 1.00 mg/dL   Calcium 9.0 8.9 - 10.3 mg/dL   Total Protein 7.4 6.5 - 8.1 g/dL   Albumin 3.8 3.5 - 5.0 g/dL   AST 22 15 - 41 U/L   ALT 17 0 - 44 U/L   Alkaline Phosphatase 72 38 - 126 U/L   Total Bilirubin 0.7 0.3 - 1.2 mg/dL   GFR, Estimated >60 >60 mL/min    Comment: (NOTE) Calculated using the CKD-EPI Creatinine Equation (2021)    Anion gap 11 5 - 15    Comment: Performed at [facility], [address] [place], [place] [address]  CBC     Status: Abnormal   Collection Time: 04/24/22  9:28 AM  Result Value Ref Range   WBC 7.1 4.0 - 10.5 K/uL   RBC 4.93 3.87 - 5.11 MIL/uL   Hemoglobin 11.9 (L) 12.0 - 15.0 g/dL   HCT 38.3 36.0 - 46.0 %   MCV 77.7 (L) 80.0 - 100.0 fL   MCH 24.1 (L) 26.0 - 34.0 pg   MCHC 31.1 30.0 - 36.0 g/dL   RDW 16.2 (H) 11.5 - 15.5 %   Platelets 247 150 - 400 K/uL   nRBC 0.0 0.0 - 0.2 %    Comment: Performed at [facility], [address] [place], [place] [address]   Urinalysis, Routine w reflex microscopic -Urine, Random     Status: Abnormal   Collection Time: 04/24/22  9:28 AM  Result Value Ref Range   Color, Urine YELLOW (A) YELLOW   APPearance HAZY (A) CLEAR   Specific Gravity, Urine 1.029 1.005 - 1.030   pH 5.0 5.0 - 8.0   Glucose, UA >=500 (A) NEGATIVE mg/dL   Hgb urine dipstick NEGATIVE NEGATIVE   Bilirubin Urine NEGATIVE NEGATIVE   Ketones, ur NEGATIVE NEGATIVE mg/dL   Protein, ur NEGATIVE NEGATIVE mg/dL   Nitrite NEGATIVE NEGATIVE   Leukocytes,Ua NEGATIVE NEGATIVE   RBC / HPF 0-5 0 - 5 RBC/hpf   WBC, UA 0-5 0 - 5 WBC/hpf   Bacteria, UA NONE SEEN NONE SEEN   Squamous Epithelial / HPF 0-5 0 - 5 /HPF    Comment: Performed at [facility], [address] [place], [place] [address]  POCT CBG (Fasting - Glucose)     Status: Abnormal   Collection Time: 06/01/22 10:11 AM  Result Value Ref Range   Glucose Fasting, POC 102 (A) 70 - 99 mg/dL      Assessment & Plan:  Patient advised to continue all her medications as such.  Labs today.  Schedule mammogram. Problem List Items Addressed This Visit     DM (diabetes mellitus), type 2 ([place]) - Primary   Relevant Orders   POCT CBG (Fasting - Glucose) (Completed)   Hemoglobin A1c   Microalbumin, urine   Other Visit Diagnoses     Mixed hyperlipidemia       Relevant Orders   Lipid Panel w/o Chol/HDL Ratio   Essential hypertension, malignant       Relevant Orders   CMP14+EGFR   Coronary artery disease of native artery of native heart with stable angina pectoris ([place])       Relevant Orders   CBC With Differential   Encounter for screening mammogram for malignant neoplasm of breast       Relevant Orders   MM 3D SCREEN BREAST BILATERAL       Return in about 3 months (around 09/01/2022).   Total time spent: 30 minutes  Perrin Maltese, MD  06/01/2022

## 2022-06-02 LAB — CMP14+EGFR
ALT: 24 IU/L (ref 0–32)
AST: 25 IU/L (ref 0–40)
Albumin/Globulin Ratio: 1.7 (ref 1.2–2.2)
Albumin: 4.2 g/dL (ref 3.8–4.8)
Alkaline Phosphatase: 79 IU/L (ref 44–121)
BUN/Creatinine Ratio: 20 (ref 12–28)
BUN: 19 mg/dL (ref 8–27)
Bilirubin Total: 0.4 mg/dL (ref 0.0–1.2)
CO2: 24 mmol/L (ref 20–29)
Calcium: 9.1 mg/dL (ref 8.7–10.3)
Chloride: 105 mmol/L (ref 96–106)
Creatinine, Ser: 0.93 mg/dL (ref 0.57–1.00)
Globulin, Total: 2.5 g/dL (ref 1.5–4.5)
Glucose: 91 mg/dL (ref 70–99)
Potassium: 4 mmol/L (ref 3.5–5.2)
Sodium: 145 mmol/L — ABNORMAL HIGH (ref 134–144)
Total Protein: 6.7 g/dL (ref 6.0–8.5)
eGFR: 63 mL/min/{1.73_m2} (ref >59)

## 2022-06-02 LAB — CBC WITH DIFFERENTIAL
Basophils Absolute: 0 10*3/uL (ref 0.0–0.2)
Basos: 1 %
EOS (ABSOLUTE): 0.2 10*3/uL (ref 0.0–0.4)
Eos: 3 %
Hematocrit: 37.2 % (ref 34.0–46.6)
Hemoglobin: 11.9 g/dL (ref 11.1–15.9)
Immature Grans (Abs): 0 10*3/uL (ref 0.0–0.1)
Immature Granulocytes: 0 %
Lymphocytes Absolute: 3.3 10*3/uL — ABNORMAL HIGH (ref 0.7–3.1)
Lymphs: 56 %
MCH: 24.9 pg — ABNORMAL LOW (ref 26.6–33.0)
MCHC: 32 g/dL (ref 31.5–35.7)
MCV: 78 fL — ABNORMAL LOW (ref 79–97)
Monocytes Absolute: 0.4 10*3/uL (ref 0.1–0.9)
Monocytes: 8 %
Neutrophils Absolute: 1.9 10*3/uL (ref 1.4–7.0)
Neutrophils: 32 %
RBC: 4.77 x10E6/uL (ref 3.77–5.28)
RDW: 14.4 % (ref 11.7–15.4)
WBC: 5.9 10*3/uL (ref 3.4–10.8)

## 2022-06-02 LAB — LIPID PANEL W/O CHOL/HDL RATIO
Cholesterol, Total: 121 mg/dL (ref 100–199)
HDL: 44 mg/dL (ref >39)
LDL Chol Calc (NIH): 59 mg/dL (ref 0–99)
Triglycerides: 91 mg/dL (ref 0–149)
VLDL Cholesterol Cal: 18 mg/dL (ref 5–40)

## 2022-06-02 LAB — MICROALBUMIN, URINE: Microalbumin, Urine: 11.5 ug/mL

## 2022-06-02 LAB — HEMOGLOBIN A1C
Est. average glucose Bld gHb Est-mCnc: 151 mg/dL
Hgb A1c MFr Bld: 6.9 % — ABNORMAL HIGH (ref 4.8–5.6)

## 2022-06-05 ENCOUNTER — Encounter: Payer: Self-pay | Admitting: Cardiovascular Disease

## 2022-06-05 ENCOUNTER — Other Ambulatory Visit: Payer: Self-pay | Admitting: Internal Medicine

## 2022-06-05 ENCOUNTER — Ambulatory Visit (INDEPENDENT_AMBULATORY_CARE_PROVIDER_SITE_OTHER): Payer: Medicaid Other | Admitting: Cardiovascular Disease

## 2022-06-05 VITALS — BP 132/78 | HR 64 | Ht 66.0 in | Wt 169.4 lb

## 2022-06-05 DIAGNOSIS — I10 Essential (primary) hypertension: Secondary | ICD-10-CM

## 2022-06-05 DIAGNOSIS — R42 Dizziness and giddiness: Secondary | ICD-10-CM | POA: Insufficient documentation

## 2022-06-05 DIAGNOSIS — R079 Chest pain, unspecified: Secondary | ICD-10-CM | POA: Diagnosis not present

## 2022-06-05 DIAGNOSIS — I428 Other cardiomyopathies: Secondary | ICD-10-CM

## 2022-06-05 DIAGNOSIS — E785 Hyperlipidemia, unspecified: Secondary | ICD-10-CM

## 2022-06-05 DIAGNOSIS — F411 Generalized anxiety disorder: Secondary | ICD-10-CM

## 2022-06-05 DIAGNOSIS — E1169 Type 2 diabetes mellitus with other specified complication: Secondary | ICD-10-CM | POA: Diagnosis not present

## 2022-06-05 NOTE — Assessment & Plan Note (Signed)
Patient complaining of orthostatic dizziness. B/p controlled. No change to medications today.

## 2022-06-05 NOTE — Progress Notes (Signed)
Cardiology Office Note   Date:  06/05/2022   ID:  Helen Mccormick, DOB Dec 14, 1940, MRN [id number]  PCP:  Perrin Maltese, MD  Cardiologist:  Neoma Laming, MD      History of Present Illness: Helen Mccormick is a 82 y.o. female who presents for  Chief Complaint  Patient presents with   Follow-up    Follow up    Patient in office for routine cardiac exam. Denies chest pain, shortness of breath, edema, palpitations. Patient complains of orthostatic dizziness. Patient reports an episode of chest pain about 2 weeks ago after eating a piece of sausage. She took one NTG which relieved the pain fully.    Dizziness This is a new problem. The current episode started 1 to 4 weeks ago. The problem occurs intermittently. Associated symptoms include chest pain. The symptoms are aggravated by standing. She has tried position changes and lying down for the symptoms. The treatment provided mild relief.  Chest Pain  This is a new problem. The current episode started 1 to 4 weeks ago. The onset quality is sudden. The problem occurs rarely. The problem has been resolved. The pain is present in the substernal region. The pain is mild. The pain does not radiate. The pain is aggravated by food. She has tried nitroglycerin for the symptoms. The treatment provided significant relief. Risk factors include lack of exercise, post-menopausal, sedentary lifestyle and being elderly.      Past Medical History:  Diagnosis Date   Allergy    Anxiety    CHF (congestive heart failure) (HCC)    Colon polyp    Depression    GERD (gastroesophageal reflux disease)    Heart murmur    Hyperlipidemia    Hypertension    Hypothyroidism    Sleep apnea    Thyroid disease      Past Surgical History:  Procedure Laterality Date   ABDOMINAL HYSTERECTOMY     BACK SURGERY     BACK SURGERY     CERVICAL FUSION     COLONOSCOPY WITH PROPOFOL N/A 05/28/2017   Procedure: COLONOSCOPY WITH PROPOFOL;  Surgeon: Jonathon Bellows,  MD;  Location: [facility] ENDOSCOPY;  Service: Gastroenterology;  Laterality: N/A;   COLONOSCOPY WITH PROPOFOL N/A 11/17/2019   Procedure: COLONOSCOPY WITH PROPOFOL;  Surgeon: Lesly Rubenstein, MD;  Location: [facility] ENDOSCOPY;  Service: Endoscopy;  Laterality: N/A;   ESOPHAGOGASTRODUODENOSCOPY (EGD) WITH PROPOFOL N/A 11/17/2019   Procedure: ESOPHAGOGASTRODUODENOSCOPY (EGD) WITH PROPOFOL;  Surgeon: Lesly Rubenstein, MD;  Location: [facility] ENDOSCOPY;  Service: Endoscopy;  Laterality: N/A;   NECK SURGERY     THYROID SURGERY     THYROID SURGERY       Current Outpatient Medications  Medication Sig Dispense Refill   amLODipine (NORVASC) 5 MG tablet TAKE 1 TABLET(5 MG) BY MOUTH DAILY FOR BLOOD PRESSURE 90 tablet 1   aspirin 81 MG chewable tablet Chew 81 mg by mouth daily.      Azelastine HCl 137 MCG/SPRAY SOLN SMARTSIG:1-2 Spray(s) Both Nares Twice Daily     Blood Glucose Monitoring Suppl (ACCU-CHEK GUIDE) w/Device KIT See admin instructions.     busPIRone (BUSPAR) 15 MG tablet Take 15 mg by mouth 2 (two) times daily.     carvedilol (COREG) 12.5 MG tablet Take 12.5 mg by mouth 2 (two) times daily.     ENTRESTO 24-26 MG Take 1 tablet by mouth 2 (two) times daily. 60 tablet 5   escitalopram (LEXAPRO) 10 MG tablet TAKE 1 TABLET BY  MOUTH EVERY DAY FOR DEPRESSION 90 tablet 1   FARXIGA 10 MG TABS tablet Take 10 mg by mouth daily.     FLOVENT HFA 44 MCG/ACT inhaler SMARTSIG:2 Puff(s) By Mouth Twice Daily     glucose blood test strip Use 1 strip to check your blood sugar two times daily. 50 each 12   isosorbide mononitrate (IMDUR) 30 MG 24 hr tablet TAKE 1 TABLET(30 MG) BY MOUTH DAILY 30 tablet 2   levothyroxine (SYNTHROID) 75 MCG tablet Take by mouth.     levothyroxine (SYNTHROID, LEVOTHROID) 75 MCG tablet TAKE 1 TABLET BY MOUTH DAILY BEFORE BREAKFAST FOR THYROID 90 tablet 1   rosuvastatin (CRESTOR) 40 MG tablet Take 40 mg by mouth daily.     acetaminophen (TYLENOL) 325 MG tablet Take 2 tablets (650 mg total)  by mouth every 6 (six) hours as needed. (Patient not taking: Reported on 06/05/2022) 60 tablet 0   albuterol (PROVENTIL HFA;VENTOLIN HFA) 108 (90 Base) MCG/ACT inhaler Inhale into the lungs every 6 (six) hours as needed for wheezing or shortness of breath. (Patient not taking: Reported on 06/05/2022)     atenolol (TENORMIN) 50 MG tablet Take 1 tablet by mouth daily. (Patient not taking: Reported on 06/05/2022)     atorvastatin (LIPITOR) 20 MG tablet Take 1 tablet by mouth daily. (Patient not taking: Reported on 06/05/2022)     Baclofen 5 MG TABS Take by mouth at bedtime. (Patient not taking: Reported on 06/05/2022)     betamethasone dipropionate 0.05 % cream Apply topically. (Patient not taking: Reported on 06/05/2022)     calcium carbonate (OS-CAL) 600 MG TABS tablet Take 600 mg by mouth 2 (two) times daily with a meal. (Patient not taking: Reported on 06/05/2022)     cefdinir (OMNICEF) 300 MG capsule Take by mouth. (Patient not taking: Reported on 06/05/2022)     cetirizine (ZYRTEC) 10 MG tablet Take 1 tablet by mouth daily. (Patient not taking: Reported on 06/05/2022)     cyclobenzaprine (FLEXERIL) 5 MG tablet Take 1 tablet (5 mg total) by mouth 3 (three) times daily as needed for muscle spasms. (Patient not taking: Reported on 06/05/2022) 30 tablet 0   diclofenac Sodium (VOLTAREN) 1 % GEL Apply 4 g topically 4 (four) times daily. (Patient not taking: Reported on 06/05/2022) 50 g 1   dicyclomine (BENTYL) 10 MG capsule Take 1 capsule by mouth 3 (three) times daily as needed. (Patient not taking: Reported on 06/05/2022)     ergocalciferol (VITAMIN D2) 1.25 MG (50000 UT) capsule ergocalciferol (vitamin D2) 1,250 mcg (50,000 unit) capsule  TK 1 C PO 1 TIME A WK (Patient not taking: Reported on 06/05/2022)     famotidine (PEPCID) 20 MG tablet Take 1 tablet (20 mg total) by mouth 2 (two) times daily. (Patient not taking: Reported on 06/05/2022) 60 tablet 0   Flaxseed, Linseed, (FLAX SEED OIL) 1000 MG CAPS Take by mouth. (Patient  not taking: Reported on 06/05/2022)     fluticasone (FLONASE) 50 MCG/ACT nasal spray Place 2 sprays into both nostrils daily. (Patient not taking: Reported on [id number])     Garlic [id number] MG CAPS Take by mouth daily. (Patient not taking: Reported on 02/10/2021)     hydrochlorothiazide (HYDRODIURIL) 12.5 MG tablet TAKE 1 TABLET(12.5 MG) BY MOUTH DAILY (Patient not taking: Reported on 06/05/2022) 90 tablet 0   HYDROcodone-acetaminophen (NORCO/VICODIN) 5-325 MG tablet Take 1 tablet by mouth every 4 (four) hours as needed for moderate pain. (Patient not taking: Reported on 06/05/2022) 10 tablet  0   Hyoscyamine Sulfate SL (LEVSIN/SL) 0.125 MG SUBL Place 0.125 mg under the tongue 4 (four) times daily as needed. (Patient not taking: Reported on 06/05/2022) 60 tablet 0   lidocaine (LIDODERM) 5 % Place 1 patch onto the skin every 12 (twelve) hours. Remove & Discard patch within 12 hours or as directed by MD.  [place] the patch off for 12 hours before applying a new one. (Patient not taking: Reported on 06/05/2022) 10 patch 0   meloxicam (MOBIC) 15 MG tablet meloxicam 15 mg tablet  Take 1 tablet every day by oral route. (Patient not taking: Reported on 06/05/2022)     metoprolol succinate (TOPROL-XL) 25 MG 24 hr tablet TAKE 1 TABLET(25 MG) BY MOUTH DAILY (Patient not taking: Reported on 06/05/2022) 90 tablet 0   Multiple Vitamins-Minerals (PRESERVISION AREDS 2 PO) Take by mouth. (Patient not taking: Reported on 06/05/2022)     nitroGLYCERIN (NITROSTAT) 0.4 MG SL tablet Place 1 tablet (0.4 mg total) under the tongue every 5 (five) minutes as needed for chest pain. (Patient not taking: Reported on 06/05/2022) 50 tablet 3   Omega-3 1000 MG CAPS Take 1 capsule by mouth daily.  (Patient not taking: Reported on 06/05/2022)     omeprazole (PRILOSEC) 40 MG capsule Take 1 capsule by mouth daily. (Patient not taking: Reported on 06/05/2022)     pantoprazole (PROTONIX) 20 MG tablet Take 1 tablet (20 mg total) by mouth daily. FOR ACID REFLUX AND  STOMACH PAIN 90 tablet 1   pantoprazole (PROTONIX) 40 MG tablet Take by mouth. (Patient not taking: Reported on 06/05/2022)     polyethylene glycol powder (GLYCOLAX/MIRALAX) 17 GM/SCOOP powder polyethylene glycol 3350 17 gram/dose oral powder  TK  PO ONCE FOR 1 DOSE (Patient not taking: Reported on 06/05/2022)     predniSONE (DELTASONE) 10 MG tablet Take 1 tablet (10 mg total) by mouth daily. Day 1-3: take 4 tablets PO daily Day 4-6: take 3 tablets PO daily Day 7-9: take 2 tablets PO daily Day 10-12: take 1 tablet PO daily (Patient not taking: Reported on 06/05/2022) 30 tablet 0   tiZANidine (ZANAFLEX) 2 MG tablet Take 2 mg by mouth 3 (three) times daily. (Patient not taking: Reported on 06/05/2022)     traMADol (ULTRAM) 50 MG tablet Take 1 tablet by mouth every 6 (six) hours as needed. (Patient not taking: Reported on 06/05/2022)     No current facility-administered medications for this visit.    Allergies:   Amlodipine, Tramadol, Codeine, and Etodolac    Social History:   reports that she has never smoked. She has quit using smokeless tobacco.  Her smokeless tobacco use included chew. She reports that she does not drink alcohol and does not use drugs.   Family History:  family history includes Breast cancer (age of onset: 27) in her maternal aunt; Cancer in her brother; Colon polyps in her sister; Diabetes in her mother, sister, and sister; Healthy in her daughter, daughter, sister, sister, son, and son; Heart Problems in her mother; Hypertension in her son; Leukemia in her mother; Mental illness in her sister; Stroke in her brother and father; Thyroid disease in her sister.    ROS:     Review of Systems  Constitutional: Negative.   HENT: Negative.    Eyes: Negative.   Respiratory: Negative.    Cardiovascular:  Positive for chest pain.  Gastrointestinal: Negative.   Genitourinary: Negative.   Musculoskeletal: Negative.   Skin: Negative.   Neurological: Negative.   Endo/Heme/Allergies:  Negative.   Psychiatric/Behavioral: Negative.    All other systems reviewed and are negative.   All other systems are reviewed and negative.   PHYSICAL EXAM: VS:  BP 132/78   Pulse 64   Ht '5\' 6"'$  (1.676 m)   Wt 169 lb 6.4 oz (76.8 kg)   LMP  (LMP Unknown)   SpO2 96%   BMI 27.34 kg/m  , BMI Body mass index is 27.34 kg/m. Last weight:  Wt Readings from Last 3 Encounters:  06/05/22 169 lb 6.4 oz (76.8 kg)  06/01/22 169 lb (76.7 kg)  04/24/22 149 lb 14.6 oz (68 kg)    Physical Exam Constitutional:      Appearance: Normal appearance.  Cardiovascular:     Rate and Rhythm: Normal rate and regular rhythm.     Heart sounds: Normal heart sounds.  Pulmonary:     Effort: Pulmonary effort is normal.     Breath sounds: Normal breath sounds.  Musculoskeletal:     Right lower leg: No edema.     Left lower leg: No edema.  Neurological:     Mental Status: She is alert.    EKG: none today  Recent Labs: 04/24/2022: Platelets 247 06/01/2022: ALT 24; BUN 19; Creatinine, Ser 0.93; Hemoglobin 11.9; Potassium 4.0; Sodium 145    Lipid Panel    Component Value Date/Time   CHOL 121 06/01/2022 1107   TRIG 91 06/01/2022 1107   HDL 44 06/01/2022 1107   CHOLHDL 2.8 11/08/2018 0806   VLDL 23 08/16/2017 0528   LDLCALC 59 06/01/2022 1107   [place] 60 11/08/2018 0806      Other studies Reviewed: Patient: 5477.0 - Helen Mccormick DOB:  10/07/1940  Date:  11/27/2021 10:00 Provider: Neoma Laming MD Encounter: ECHO   Page 1 REASON FOR VISIT  Visit for: Echocardiogram/I50.9  Sex:   Female  wt=171    lbs.  BP=112/64  Height= 64   inches.        TESTS  Imaging: Echocardiogram:  An echocardiogram in (2-d) mode was performed and in Doppler mode with color flow velocity mapping was performed. The aortic valve cusps are abnormal 1.2  cm, flow velocity 1.21   m/s, and systolic calculated mean flow gradient 3  mmHg. Mitral valve diastolic peak flow velocity E .637    m/s and E/A ratio  0.5. Aortic root diameter 2.6   cm. The LVOT internal diameter 3.0  cm and flow velocity was abnormal 1.06  m/s. LV systolic dimension 2.9  cm, diastolic Q000111Q  cm, posterior wall thickness 0.796 cm, fractional shortening 38.6 %, and EF 68.7  %. IVS thickness 0.837 cm. LA dimension 3.7 cm. Mitral Valve has Mild Regurgitation. Tricuspid Valve has Trace Regurgitation.     ASSESSMENT  Technically adequate study.  Normal chamber sizes.  Normal left ventricular systolic function.  Mild left ventricular hypertrophy with GRADE 3 (restrictive physiology) diastolic dysfunction.  Normal right ventricular systolic function.  Normal right ventricular diastolic function.  Normal left ventricular wall motion.  Normal right ventricular wall motion.  Trace tricuspid regurgitation.  Normal pulmonary artery pressure.  Mild mitral regurgitation.  No pericardial effusion.  Mild LVH.   THERAPY   Referring physician: Dionisio David  Sonographer: Neoma Laming.  Neoma Laming MD  Electronically signed by: Neoma Laming     Date: 11/28/2021 09:22  Patient: 5477.0 - Helen Mccormick DOB:  12/22/1940  Date:  01/02/2021 07:45 Provider: Neoma Laming MD Encounter: NUCLEAR STRESS TEST   Page 1  TESTS   [facility] [address] [place], [place] [address] [phone] STUDY:  Gated Stress / Rest Myocardial Perfusion Imaging Tomographic (SPECT) Including attenuation correction Wall Motion, Left Ventricular Ejection Fraction By Gated Technique.Persantine Stress Test. SEX: Female   WEIGHT: 173 lbs   HEIGHT: 64 in       ARMS UP: YES/NO                                                                                                                                                                                REFERRING PHYSICIAN: Dr.Mikiah Demond Humphrey Rolls                                                                                                                                                                                                                        INDICATION FOR STUDY: CP  TECHNIQUE:  Approximately 20 minutes following the intravenous administration of 10.0 mCi of Tc-63mSestamibi after stress testing in a reclined supine position with arms above their head if able to do so, gated SPECT imaging of the heart was performed. After about a 2hr break, the patient was injected intravenously with 31.1 mCi of Tc-946mestamibi.  Approximately 45 minutes later in the same position as stress imaging SPECT rest imaging of the heart was performed.  STRESS BY:  ShNeoma LamingMD PROTOCOL:  Persantine  DOSE ADMIN: 8.8 cc   ROUTE OF ADMINISTRATION: IV                                                                            MAX PRED HR: 142                     85%: 121               75%: 107                                                                                                                   RESTING BP: 136/74   RESTING HR: 61  PEAK BP: 134/64   PEAK HR: 83                                                                    EXERCISE DURATION:    4 min injection                                            REASON FOR TEST TERMINATION:    Protocol end                                                                                                                              SYMPTOMS:  None                                                                                                                                                                                                          EKG RESULTS:  NSR. 87/min. LBBB. No significant ST changes with persantine.                                                             IMAGE QUALITY:  Good                                                                                                                                                                                                                                                                                                                                  PERFUSION/WALL  MOTION FINDINGS: EF = 61%. Small mild reversible basal anteroseptal, basal inferoseptal, mid anteroseptal, and mid inferoseptal wall defects, normal wall motion.                                                                           IMPRESSION: Ischemia in the LAD territory with normal LVEF, advise CCTA.                                                                                                                                                                                                                                                                                         Neoma Laming, MD Stress Interpreting Physician / Nuclear Interpreting Physician  Neoma Laming MD  Electronically signed by: Neoma Laming     Date: 01/03/2021 10:47  Patient: [id number] - Helen Mccormick DOB:  12/13/1940  Date:  08/22/2018 11:00 Provider: Neoma Laming MD Encounter: ALL ANGIOGRAMS (CTA BRAIN, CAROTIDS, RENAL ARTERIES, PE)   Page 2 REASON FOR VISIT  Referred by Dr.Deniesha Stenglein Humphrey Rolls.    TESTS  Imaging: Computed Tomographic Angiography:  Cardiac multidetector CT was performed paying particular attention to the coronary arteries for the diagnosis of: Chest Pain. Diagnostic Drugs:  Administered iohexol (Omnipaque) through an antecubital vein and images from the examination were analyzed for the presence and extent of coronary artery disease, using 3D image processing software. 100 mL of non-ionic contrast (Omnipaque) was used.    TEST CONCLUSIONS  Quality of study: Excellent   1-Calcium score: 0  2-Right dominant system.  3-Normal coronaries.   Neoma Laming MD  Electronically signed by: Neoma Laming     Date:  08/24/2018 15:04   ASSESSMENT AND PLAN:    ICD-10-CM   1. Chest pain, unspecified type  R07.9 MYOCARDIAL PERFUSION IMAGING    2. Primary hypertension  I10     3. Nonischemic cardiomyopathy ([place])  I42.8     4. 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 Will order a stress test to check for blockages.       Relevant Orders   MYOCARDIAL PERFUSION IMAGING   Dizziness    Patient complaining of orthostatic dizziness. B/p controlled. No change to medications today.        Disposition:   Return in about 4 weeks (around 07/03/2022) for after stress test.    Total time spent: 30 minutes  Signed,  Neoma Laming, MD  06/05/2022 10:08 AM    [place]

## 2022-06-05 NOTE — Assessment & Plan Note (Signed)
Patient had an episode of chest pain two weeks ago after eating a piece of sausage, relieved with one NTG. Will order a stress test to check for blockages.

## 2022-07-01 ENCOUNTER — Observation Stay
Admission: EM | Admit: 2022-07-01 | Discharge: 2022-07-02 | Disposition: A | Discharge status: Home / Self Care | Payer: 59 | Attending: Internal Medicine | Admitting: Internal Medicine

## 2022-07-01 ENCOUNTER — Other Ambulatory Visit: Payer: Self-pay

## 2022-07-01 ENCOUNTER — Ambulatory Visit (INDEPENDENT_AMBULATORY_CARE_PROVIDER_SITE_OTHER): Payer: Medicaid Other

## 2022-07-01 ENCOUNTER — Emergency Department: Payer: 59

## 2022-07-01 DIAGNOSIS — R55 Syncope and collapse: Principal | ICD-10-CM

## 2022-07-01 DIAGNOSIS — R079 Chest pain, unspecified: Secondary | ICD-10-CM | POA: Diagnosis not present

## 2022-07-01 DIAGNOSIS — R109 Unspecified abdominal pain: Secondary | ICD-10-CM | POA: Diagnosis not present

## 2022-07-01 DIAGNOSIS — F32A Depression, unspecified: Secondary | ICD-10-CM | POA: Insufficient documentation

## 2022-07-01 DIAGNOSIS — Z79899 Other long term (current) drug therapy: Secondary | ICD-10-CM | POA: Insufficient documentation

## 2022-07-01 DIAGNOSIS — I10 Essential (primary) hypertension: Secondary | ICD-10-CM

## 2022-07-01 DIAGNOSIS — E039 Hypothyroidism, unspecified: Secondary | ICD-10-CM | POA: Insufficient documentation

## 2022-07-01 DIAGNOSIS — Y92828 Other wilderness area as the place of occurrence of the external cause: Secondary | ICD-10-CM | POA: Diagnosis not present

## 2022-07-01 DIAGNOSIS — E119 Type 2 diabetes mellitus without complications: Secondary | ICD-10-CM | POA: Diagnosis not present

## 2022-07-01 DIAGNOSIS — Z7982 Long term (current) use of aspirin: Secondary | ICD-10-CM | POA: Diagnosis not present

## 2022-07-01 DIAGNOSIS — E785 Hyperlipidemia, unspecified: Secondary | ICD-10-CM | POA: Diagnosis not present

## 2022-07-01 DIAGNOSIS — W010XXA Fall on same level from slipping, tripping and stumbling without subsequent striking against object, initial encounter: Secondary | ICD-10-CM | POA: Diagnosis not present

## 2022-07-01 DIAGNOSIS — R2689 Other abnormalities of gait and mobility: Secondary | ICD-10-CM | POA: Diagnosis not present

## 2022-07-01 DIAGNOSIS — I509 Heart failure, unspecified: Secondary | ICD-10-CM | POA: Diagnosis not present

## 2022-07-01 DIAGNOSIS — I110 Hypertensive heart disease with heart failure: Secondary | ICD-10-CM | POA: Insufficient documentation

## 2022-07-01 DIAGNOSIS — S22080A Wedge compression fracture of T11-T12 vertebra, initial encounter for closed fracture: Secondary | ICD-10-CM | POA: Diagnosis not present

## 2022-07-01 DIAGNOSIS — Z87891 Personal history of nicotine dependence: Secondary | ICD-10-CM | POA: Diagnosis not present

## 2022-07-01 DIAGNOSIS — S22089A Unspecified fracture of T11-T12 vertebra, initial encounter for closed fracture: Secondary | ICD-10-CM

## 2022-07-01 LAB — BASIC METABOLIC PANEL
Anion gap: 9 (ref 5–15)
BUN: 20 mg/dL (ref 8–23)
CO2: 24 mmol/L (ref 22–32)
Calcium: 8.7 mg/dL — ABNORMAL LOW (ref 8.9–10.3)
Chloride: 107 mmol/L (ref 98–111)
Creatinine, Ser: 0.9 mg/dL (ref 0.44–1.00)
GFR, Estimated: >60 mL/min (ref >60)
Glucose, Bld: 136 mg/dL — ABNORMAL HIGH (ref 70–99)
Potassium: 3.8 mmol/L (ref 3.5–5.1)
Sodium: 140 mmol/L (ref 135–145)

## 2022-07-01 LAB — CBC
HCT: 35.9 % — ABNORMAL LOW (ref 36.0–46.0)
Hemoglobin: 11.1 g/dL — ABNORMAL LOW (ref 12.0–15.0)
MCH: 24.4 pg — ABNORMAL LOW (ref 26.0–34.0)
MCHC: 30.9 g/dL (ref 30.0–36.0)
MCV: 79.1 fL — ABNORMAL LOW (ref 80.0–100.0)
Platelets: 179 10*3/uL (ref 150–400)
RBC: 4.54 MIL/uL (ref 3.87–5.11)
RDW: 14.8 % (ref 11.5–15.5)
WBC: 5.1 10*3/uL (ref 4.0–10.5)
nRBC: 0 % (ref 0.0–0.2)

## 2022-07-01 LAB — URINALYSIS, ROUTINE W REFLEX MICROSCOPIC
Bacteria, UA: NONE SEEN
Bilirubin Urine: NEGATIVE
Glucose, UA: >=500 mg/dL — AB
Hgb urine dipstick: NEGATIVE
Ketones, ur: NEGATIVE mg/dL
Leukocytes,Ua: NEGATIVE
Nitrite: NEGATIVE
Protein, ur: NEGATIVE mg/dL
Specific Gravity, Urine: 1.037 — ABNORMAL HIGH (ref 1.005–1.030)
pH: 5 (ref 5.0–8.0)

## 2022-07-01 LAB — HEPATIC FUNCTION PANEL
ALT: 28 U/L (ref 0–44)
AST: 31 U/L (ref 15–41)
Albumin: 3.5 g/dL (ref 3.5–5.0)
Alkaline Phosphatase: 68 U/L (ref 38–126)
Bilirubin, Direct: <0.1 mg/dL (ref 0.0–0.2)
Total Bilirubin: 0.7 mg/dL (ref 0.3–1.2)
Total Protein: 6.4 g/dL — ABNORMAL LOW (ref 6.5–8.1)

## 2022-07-01 LAB — MAGNESIUM: Magnesium: 2.2 mg/dL (ref 1.7–2.4)

## 2022-07-01 LAB — LIPASE, BLOOD: Lipase: 23 U/L (ref 11–51)

## 2022-07-01 LAB — TROPONIN I (HIGH SENSITIVITY): Troponin I (High Sensitivity): 5 ng/L (ref <18)

## 2022-07-01 LAB — CBG MONITORING, ED: Glucose-Capillary: 183 mg/dL — ABNORMAL HIGH (ref 70–99)

## 2022-07-01 MED ORDER — ONDANSETRON HCL 4 MG/2ML IJ SOLN: 4.0000 mg | Freq: Four times a day (QID) | INTRAMUSCULAR | Status: DC | PRN

## 2022-07-01 MED ORDER — TECHNETIUM TC 99M SESTAMIBI GENERIC - CARDIOLITE
32.9000 | Freq: Once | INTRAVENOUS | Status: AC | PRN
Start: 2022-07-01 — End: 2022-07-01
  Administered 2022-07-01: 32.9 via INTRAVENOUS

## 2022-07-01 MED ORDER — SODIUM CHLORIDE 0.9 % IV SOLN: INTRAVENOUS | Status: DC

## 2022-07-01 MED ORDER — ACETAMINOPHEN 500 MG PO TABS
1000.0000 mg | ORAL_TABLET | Freq: Once | ORAL | Status: AC
  Administered 2022-07-01: 1000 mg via ORAL
  Filled 2022-07-01: qty 2

## 2022-07-01 MED ORDER — IOHEXOL 300 MG/ML  SOLN
100.0000 mL | Freq: Once | INTRAMUSCULAR | Status: AC | PRN
  Administered 2022-07-01: 100 mL via INTRAVENOUS

## 2022-07-01 MED ORDER — ENOXAPARIN SODIUM 40 MG/0.4ML IJ SOSY: 40.0000 mg | PREFILLED_SYRINGE | INTRAMUSCULAR | Status: DC

## 2022-07-01 MED ORDER — ACETAMINOPHEN 325 MG PO TABS
650.0000 mg | ORAL_TABLET | Freq: Four times a day (QID) | ORAL | Status: DC | PRN
  Administered 2022-07-02: 650 mg via ORAL
  Filled 2022-07-01: qty 2

## 2022-07-01 MED ORDER — MORPHINE SULFATE (PF) 2 MG/ML IV SOLN: 2.0000 mg | INTRAVENOUS | Status: DC | PRN

## 2022-07-01 MED ORDER — LIDOCAINE 5 % EX PTCH
1.0000 | MEDICATED_PATCH | Freq: Once | CUTANEOUS | Status: AC
  Administered 2022-07-02: 1 via TRANSDERMAL
  Filled 2022-07-01: qty 1

## 2022-07-01 MED ORDER — TRAZODONE HCL 50 MG PO TABS: 25.0000 mg | ORAL_TABLET | Freq: Every evening | ORAL | Status: DC | PRN

## 2022-07-01 MED ORDER — ONDANSETRON HCL 4 MG PO TABS: 4.0000 mg | ORAL_TABLET | Freq: Four times a day (QID) | ORAL | Status: DC | PRN

## 2022-07-01 MED ORDER — ACETAMINOPHEN 325 MG RE SUPP: 650.0000 mg | Freq: Four times a day (QID) | RECTAL | Status: DC | PRN

## 2022-07-01 MED ORDER — MAGNESIUM HYDROXIDE 400 MG/5ML PO SUSP: 30.0000 mL | Freq: Every day | ORAL | Status: DC | PRN

## 2022-07-01 MED ORDER — TECHNETIUM TC 99M SESTAMIBI GENERIC - CARDIOLITE
10.4000 | Freq: Once | INTRAVENOUS | Status: AC | PRN
Start: 2022-07-01 — End: 2022-07-01
  Administered 2022-07-01: 10.4 via INTRAVENOUS

## 2022-07-01 NOTE — H&P (Signed)
[place]   PATIENT NAME: Helen Mccormick    MR#:  [id number]  DATE OF BIRTH:  [birth year]  DATE OF ADMISSION:  07/01/2022  PRIMARY CARE PHYSICIAN: Perrin Maltese, MD   Patient is coming from: Home  REQUESTING/REFERRING PHYSICIAN: Marjean Donna, MD  CHIEF COMPLAINT:   Chief Complaint  Patient presents with   Near Syncope    HISTORY OF PRESENT ILLNESS:  Helen Mccormick is a 82 y.o. female with medical history significant for hypertension, dyslipidemia, hypothyroidism, OSA, hypothyroidism, CHF and anxiety, who presented to the emergency room with acute onset of syncope while shopping today.  Prior to that she had a cardiac stress test at Dr. Laurelyn Sickle office during which she experienced chest pain that she graded 10/10 with radiation to her left arm.  She stated that when she had syncope she fell on her side and had subsequent mid back pain.  She denied any paresthesias or focal muscle weakness.  No bleeding diathesis.  No fever or chills.  No nausea or vomiting or abdominal pain.  No cough or wheezing or dyspnea.  ED Course: When she came to the ER, BP was 143/87 with otherwise normal vital signs.  Labs revealed unremarkable BMP and LFTs.  High sensitive troponin I was 5 and later 6.  CBC showed mild anemia with microcytosis.  UA showed more than 500 glucose and specific gravity that was elevated at 1037. EKG as reviewed by me : EKG showed normal sinus rhythm with a rate of 63 with left bundle branch block. Imaging: Noncontrasted head CT scan revealed no acute intracranial process.  C-spine CT showed no evidence for spine fracture.  It showed stable C3-4 ACDF with stable lower cervical spondylosis and facet hyper prophy.  Abdominal and pelvic CT scan revealed the following: No bowel obstruction, free air or free fluid. Normal appendix. Scattered stool and diverticula.   Diffuse atherosclerotic changes with mild focal aortic stenosis.   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 This is slightly larger but was seen in 2016.   Acute appearing mild compression of the superior endplate of 624THL. Please correlate for any known history of injury. Please see separate dictation of lumbar spine CT.  The patient was given a gram of p.o. Tylenol and Lidoderm patch.  She will be admitted to an observation medical telemetry bed for further evaluation and management. PAST MEDICAL HISTORY:   Past Medical History:  Diagnosis Date   Allergy    Anxiety    CHF (congestive heart failure) (HCC)    Colon polyp    Depression    GERD (gastroesophageal reflux disease)    Heart murmur    Hyperlipidemia    Hypertension    Hypothyroidism    Sleep apnea    Thyroid disease     PAST SURGICAL HISTORY:   Past Surgical History:  Procedure Laterality Date   ABDOMINAL HYSTERECTOMY     BACK SURGERY     BACK SURGERY     CERVICAL FUSION     COLONOSCOPY WITH PROPOFOL N/A 05/28/2017   Procedure: COLONOSCOPY WITH PROPOFOL;  Surgeon: Jonathon Bellows, MD;  Location: [facility] ENDOSCOPY;  Service: Gastroenterology;  Laterality: N/A;   COLONOSCOPY WITH PROPOFOL N/A 11/17/2019   Procedure: COLONOSCOPY WITH PROPOFOL;  Surgeon: Lesly Rubenstein, MD;  Location: [facility] ENDOSCOPY;  Service: Endoscopy;  Laterality: N/A;   ESOPHAGOGASTRODUODENOSCOPY (EGD) WITH PROPOFOL N/A 11/17/2019   Procedure: ESOPHAGOGASTRODUODENOSCOPY (EGD) WITH PROPOFOL;  Surgeon: Lesly Rubenstein, MD;  Location: [facility] ENDOSCOPY;  Service: Endoscopy;  Laterality: N/A;   NECK SURGERY     THYROID SURGERY     THYROID SURGERY      SOCIAL HISTORY:   Social History   Tobacco Use   Smoking status: Never   Smokeless tobacco: Former    Types: Chew  Substance Use Topics   Alcohol use: No    FAMILY HISTORY:   Family History  Problem Relation Age of Onset   Breast cancer Maternal Aunt 29   Diabetes Mother    Leukemia Mother    Heart Problems Mother    Diabetes Sister    Mental illness Sister    Colon polyps Sister     Stroke Father    Cancer Brother        rectal   Healthy Daughter    Hypertension Son    Diabetes Sister    Thyroid disease Sister    Healthy Sister    Healthy Sister    Stroke Brother    Healthy Son    Healthy Son    Healthy Daughter     DRUG ALLERGIES:   Allergies  Allergen Reactions   Amlodipine Hives and Swelling   Tramadol Other (See Comments)    Dizziness    Codeine Nausea Only   Etodolac Other (See Comments)    Other reaction(s): Abdominal Pain    REVIEW OF SYSTEMS:   ROS As per history of present illness. All pertinent systems were reviewed above. Constitutional, HEENT, cardiovascular, respiratory, GI, GU, musculoskeletal, neuro, psychiatric, endocrine, integumentary and hematologic systems were reviewed and are otherwise negative/unremarkable except for positive findings mentioned above in the HPI.   MEDICATIONS AT HOME:   Prior to Admission medications   Medication Sig Start Date End Date Taking? Authorizing Provider  acetaminophen (TYLENOL) 325 MG tablet Take 2 tablets (650 mg total) by mouth every 6 (six) hours as needed. Patient not taking: Reported on 06/05/2022 02/25/20   Carrie Mew, MD  albuterol (PROVENTIL HFA;VENTOLIN HFA) [phone] Base) MCG/ACT inhaler Inhale into the lungs every 6 (six) hours as needed for wheezing or shortness of breath. Patient not taking: Reported on 06/05/2022    [provider]  amLODipine (NORVASC) 5 MG tablet TAKE 1 TABLET(5 MG) BY MOUTH DAILY FOR BLOOD PRESSURE 02/06/19   Parks Ranger, Devonne Doughty, DO  aspirin 81 MG chewable tablet Chew 81 mg by mouth daily.     [provider]  atenolol (TENORMIN) 50 MG tablet Take 1 tablet by mouth daily. Patient not taking: Reported on 06/05/2022    [provider]  atorvastatin (LIPITOR) 20 MG tablet Take 1 tablet by mouth daily. Patient not taking: Reported on 06/05/2022    [provider]  Azelastine HCl 137 MCG/SPRAY SOLN SMARTSIG:1-2 Spray(s) Both Nares  Twice Daily 07/24/19   [provider]  Baclofen 5 MG TABS Take by mouth at bedtime. Patient not taking: Reported on 06/05/2022 11/24/19   [provider]  betamethasone dipropionate 0.05 % cream Apply topically. Patient not taking: Reported on 06/05/2022    [provider]  Blood Glucose Monitoring Suppl (ACCU-CHEK GUIDE) w/Device KIT See admin instructions. 11/28/20   [provider]  busPIRone (BUSPAR) 15 MG tablet TAKE 1 TABLET BY MOUTH TWICE A DAY 06/05/22   Perrin Maltese, MD  busPIRone (BUSPAR) 15 MG tablet Take 15 mg by mouth 2 (two) times daily. 03/06/22   [provider]  calcium carbonate (OS-CAL) 600 MG TABS tablet Take 600 mg by mouth  2 (two) times daily with a meal. Patient not taking: Reported on 06/05/2022    [provider]  carvedilol (COREG) 12.5 MG tablet Take 12.5 mg by mouth 2 (two) times daily. 12/16/20   [provider]  cefdinir (OMNICEF) 300 MG capsule Take by mouth. Patient not taking: Reported on 06/05/2022 10/14/20   [provider]  cetirizine (ZYRTEC) 10 MG tablet Take 1 tablet by mouth daily. Patient not taking: Reported on 06/05/2022    [provider]  cyclobenzaprine (FLEXERIL) 5 MG tablet Take 1 tablet (5 mg total) by mouth 3 (three) times daily as needed for muscle spasms. Patient not taking: Reported on 06/05/2022 06/21/21   Hinda Kehr, MD  diclofenac Sodium (VOLTAREN) 1 % GEL Apply 4 g topically 4 (four) times daily. Patient not taking: Reported on 06/05/2022 06/21/21   Hinda Kehr, MD  dicyclomine (BENTYL) 10 MG capsule Take 1 capsule by mouth 3 (three) times daily as needed. Patient not taking: Reported on 06/05/2022    [provider]  ENTRESTO 24-26 MG Take 1 tablet by mouth 2 (two) times daily. 11/28/19   Loel Dubonnet, NP  ergocalciferol (VITAMIN D2) 1.25 MG (50000 UT) capsule ergocalciferol (vitamin D2) 1,250 mcg (50,000 unit) capsule  TK 1 C PO 1 TIME A WK Patient not taking:  Reported on 06/05/2022    [provider]  escitalopram (LEXAPRO) 10 MG tablet TAKE 1 TABLET BY MOUTH EVERY DAY FOR DEPRESSION 04/25/19   Parks Ranger, Devonne Doughty, DO  famotidine (PEPCID) 20 MG tablet Take 1 tablet (20 mg total) by mouth 2 (two) times daily. Patient not taking: Reported on 06/05/2022 02/25/20   Carrie Mew, MD  FARXIGA 10 MG TABS tablet Take 10 mg by mouth daily.    [provider]  Flaxseed, Linseed, (FLAX SEED OIL) 1000 MG CAPS Take by mouth. Patient not taking: Reported on 06/05/2022    [provider]  FLOVENT HFA 44 MCG/ACT inhaler SMARTSIG:2 Puff(s) By Mouth Twice Daily 09/23/19   [provider]  fluticasone (FLONASE) 50 MCG/ACT nasal spray Place 2 sprays into both nostrils daily. Patient not taking: Reported on 06/05/2022    [provider]  Garlic 123XX123 MG CAPS Take by mouth daily. Patient not taking: Reported on 02/10/2021    [provider]  glucose blood test strip Use 1 strip to check your blood sugar two times daily. 06/21/18   [name], NP  hydrochlorothiazide (HYDRODIURIL) 12.5 MG tablet TAKE 1 TABLET(12.5 MG) BY MOUTH DAILY Patient not taking: Reported on 06/05/2022 04/25/19   Olin Hauser, DO  HYDROcodone-acetaminophen (NORCO/VICODIN) 5-325 MG tablet Take 1 tablet by mouth every 4 (four) hours as needed for moderate pain. Patient not taking: Reported on 06/05/2022 10/03/21 10/03/22  Harvest Dark, MD  Hyoscyamine Sulfate SL (LEVSIN/SL) 0.125 MG SUBL Place 0.125 mg under the tongue 4 (four) times daily as needed. Patient not taking: Reported on 06/05/2022 05/09/21   Sherrie George B, FNP  isosorbide mononitrate (IMDUR) 30 MG 24 hr tablet TAKE 1 TABLET(30 MG) BY MOUTH DAILY 07/24/18   Parks Ranger, Devonne Doughty, DO  levothyroxine (SYNTHROID) 75 MCG tablet Take by mouth. 11/26/20   [provider]  levothyroxine (SYNTHROID, LEVOTHROID) 75 MCG tablet TAKE 1 TABLET BY MOUTH DAILY BEFORE BREAKFAST  FOR THYROID 06/21/18   [name], NP  meloxicam (MOBIC) 15 MG tablet meloxicam 15 mg tablet  Take 1 tablet every day by oral route. Patient not taking: Reported on 06/05/2022    [provider]  metoprolol succinate (TOPROL-XL) 25 MG 24 hr tablet TAKE 1 TABLET(25 MG) BY MOUTH DAILY Patient not taking: Reported on 06/05/2022 07/05/20   End, Harrell Gave, MD  Multiple Vitamins-Minerals (PRESERVISION AREDS 2 PO) Take by mouth. Patient not taking: Reported on 06/05/2022    [provider]  nitroGLYCERIN (NITROSTAT) 0.4 MG SL tablet Place 1 tablet (0.4 mg total) under the tongue every 5 (five) minutes as needed for chest pain. Patient not taking: Reported on 06/05/2022 08/20/17   [name], NP  Omega-3 1000 MG CAPS Take 1 capsule by mouth daily.  Patient not taking: Reported on 06/05/2022    [provider]  omeprazole (PRILOSEC) 40 MG capsule Take 1 capsule by mouth daily. Patient not taking: Reported on 06/05/2022    [provider]  pantoprazole (PROTONIX) 20 MG tablet Take 1 tablet (20 mg total) by mouth daily. FOR ACID REFLUX AND STOMACH PAIN 06/21/18 11/28/19  [name], NP  pantoprazole (PROTONIX) 40 MG tablet Take by mouth. Patient not taking: Reported on 06/05/2022 11/26/20   [provider]  polyethylene glycol powder (GLYCOLAX/MIRALAX) 17 GM/SCOOP powder polyethylene glycol 3350 17 gram/dose oral powder  TK  PO ONCE FOR 1 DOSE Patient not taking: Reported on 06/05/2022    [provider]  predniSONE (DELTASONE) 10 MG tablet Take 1 tablet (10 mg total) by mouth daily. Day 1-3: take 4 tablets PO daily Day 4-6: take 3 tablets PO daily Day 7-9: take 2 tablets PO daily Day 10-12: take 1 tablet PO daily Patient not taking: Reported on 06/05/2022 02/03/22   Harvest Dark, MD  rosuvastatin (CRESTOR) 40 MG tablet Take 40 mg by mouth daily. 12/16/20   [provider]  tiZANidine (ZANAFLEX) 2 MG tablet Take 2 mg by mouth 3  (three) times daily. Patient not taking: Reported on 06/05/2022 01/16/21   [provider]  traMADol (ULTRAM) 50 MG tablet Take 1 tablet by mouth every 6 (six) hours as needed. Patient not taking: Reported on 06/05/2022    [provider]      VITAL SIGNS:  Blood pressure (!) 159/79, pulse (!) 59, temperature 97.6 F (36.4 C), temperature source Oral, resp. rate 16, height 5\' 6"  (1.676 m), weight 73.5 kg, SpO2 100 %.  PHYSICAL EXAMINATION:  Physical Exam  GENERAL:  82 y.o.-year-old African-American female patient lying in the bed with no acute distress.  EYES: Pupils equal, round, reactive to light and accommodation. No scleral icterus. Extraocular muscles intact.  HEENT: Head atraumatic, normocephalic. Oropharynx and nasopharynx clear.  NECK:  Supple, no jugular venous distention. No thyroid enlargement, no tenderness.  LUNGS: Normal breath sounds bilaterally, no wheezing, rales,rhonchi or crepitation. No use of accessory muscles of respiration.  CARDIOVASCULAR: Regular rate and rhythm, S1, S2 normal. No murmurs, rubs, or gallops.  ABDOMEN: Soft, nondistended, nontender. Bowel sounds present. No organomegaly or mass.  EXTREMITIES: No pedal edema, cyanosis, or clubbing. Musculoskeletal: She had mid back tenderness at T12 level. NEUROLOGIC: Cranial nerves II through XII are intact. Muscle strength 5/5 in all extremities. Sensation intact. Gait not checked.  PSYCHIATRIC: The patient is alert and oriented x 3.  Normal affect and good eye contact. SKIN: No obvious rash, lesion, or ulcer.   LABORATORY PANEL:   CBC Recent Labs  Lab 07/01/22 1416  WBC 5.1  HGB 11.1*  HCT 35.9*  PLT 179   ------------------------------------------------------------------------------------------------------------------  Chemistries  Recent Labs  Lab 07/01/22 1415 07/01/22 1416 07/01/22 1900  NA  --  140  --  K  --  3.8  --   CL  --  107  --   CO2  --  24  --   GLUCOSE  --  136*   --   BUN  --  20  --   CREATININE  --  0.90  --   CALCIUM  --  8.7*  --   MG  --   --  2.2  AST 31  --   --   ALT 28  --   --   ALKPHOS 68  --   --   BILITOT 0.7  --   --    ------------------------------------------------------------------------------------------------------------------  Cardiac Enzymes No results for input(s): "TROPONINI" in the last 168 hours. ------------------------------------------------------------------------------------------------------------------  RADIOLOGY:  CT L-SPINE NO CHARGE  Result Date: 07/01/2022 CLINICAL DATA:  Fall, right back pain EXAM: CT LUMBAR SPINE WITHOUT CONTRAST TECHNIQUE: Multidetector CT imaging of the lumbar spine was performed without intravenous contrast administration. Multiplanar CT image reconstructions were also generated. RADIATION DOSE REDUCTION: This exam was performed according to the departmental dose-optimization program which includes automated exposure control, adjustment of the mA and/or kV according to patient size and/or use of iterative reconstruction technique. COMPARISON:  CT abdomen pelvis 04/24/2022 FINDINGS: Segmentation: 5 lumbar type vertebrae. Alignment: 3-4 mm anterolisthesis L3-4 and L4-5 and 3 mm retrolisthesis L5-S1 noted, likely degenerative in nature. Otherwise normal lumbar lordosis. Vertebrae: There is an acute central superior endplate fracture of 624THL which is not involve the posterior wall and results in mild (approximately 10-15%) loss of height anteriorly. No retropulsion or listhesis. The posterior elements appear intact. No other fracture identified. No suspicious lytic or blastic bone lesion. Paraspinal and other soft tissues: No paraspinal fluid collection or inflammatory change. Moderate aortoiliac mixed atherosclerotic plaque. Disc levels: Mild intervertebral disc space narrowing and endplate remodeling at 075-GRM and vacuum disc phenomena at L5-S1 is present in keeping with changes of mild to moderate  degenerative disc disease. Remaining intervertebral disc heights are preserved. Axial images demonstrate: T11-T12: Moderate facet arthrosis. Resultant severe right neuroforaminal narrowing. No canal stenosis. T12-L1: No neuroforaminal narrowing. No canal stenosis. Mild bilateral facet arthrosis. L1-2: Mild bilateral facet arthrosis. No neuroforaminal narrowing. No canal stenosis. L2-3: Mild facet arthrosis. No neuroforaminal narrowing. No canal stenosis. L3-4: Mild broad-based disc bulge. Moderate bilateral facet arthrosis. No neuroforaminal narrowing. Minimal canal stenosis. L4-5: Moderate broad-based disc bulge. Marked bilateral facet arthrosis. Resultant mild bilateral neuroforaminal narrowing and moderate central canal stenosis with possible impingement of the lateral recesses bilaterally and crossing L5 nerve roots. L5-S1 mild bilateral facet arthrosis. No neuroforaminal narrowing. No canal stenosis. IMPRESSION: 1. Acute central superior endplate fracture of 624THL resulting in mild (approximately 10-15%) loss of height anteriorly. No retropulsion or listhesis. 2. Multilevel degenerative disc and degenerative joint disease resulting in multilevel neuroforaminal narrowing as described above. 3. 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 This would be better assessed with MRI examination if indicated. 4. Aortic atherosclerosis. Aortic Atherosclerosis (ICD10-I70.0). Electronically Signed   By: Fidela Salisbury M.D.   On: 07/01/2022 20:18   CT ABDOMEN PELVIS W CONTRAST  Result Date: 07/01/2022 CLINICAL DATA:  Abdominal pain. EXAM: CT ABDOMEN AND PELVIS WITH CONTRAST TECHNIQUE: Multidetector CT imaging of the abdomen and pelvis was performed using the standard protocol following bolus administration of intravenous contrast. RADIATION DOSE REDUCTION: This exam was performed according to the departmental dose-optimization program  which includes automated exposure control, adjustment of the mA  and/or kV according to patient size and/or use of iterative reconstruction technique. CONTRAST:  144mL OMNIPAQUE IOHEXOL 300 MG/ML  SOLN COMPARISON:  CT 04/24/2022 and older FINDINGS: Lower chest: There is some linear opacity lung bases likely scar or atelectasis. Small air cysts at the left lung base laterally on series 4, image 25, unchanged from previous. Hepatobiliary: Eventration of the right hemidiaphragm. There is focal fat deposition seen in segment 4 of the liver adjacent to the falciform ligament. No other space-occupying liver lesion. Patent portal vein. Pancreas: Moderate atrophy of the pancreas. Spleen: The spleen is nonenlarged.  Small splenule. Adrenals/Urinary Tract: Left adrenal gland is preserved. There is a right adrenal gland nodule measuring 11 mm on series 2, image 20. Hounsfield unit of 103 on portal venous phase and on delay 57. This has a relative washout of 44% and is highly suggestive of an adenoma. This was present but smaller in 2016 on a CT scan. No enhancing renal mass or collecting system dilatation. The ureters have normal course and caliber down to the bladder. Preserved contours of the urinary bladder. Stomach/Bowel: Scattered colonic stool. Few colonic diverticula. No bowel obstruction, free air or free fluid. Normal appendix. The stomach and small bowel are nondilated. Vascular/Lymphatic: Scattered atherosclerotic calcified plaque along the aorta and branch vessels. There is a focal stenosis along the middle abdominal aorta diameter approaching 50%. Patent lumen narrows to short axis of 8 mm. Normal caliber IVC. There are areas of stenosis along the renal arteries. Please correlate for level of hypertension. No specific abnormal lymph node enlargement identified in the abdomen and pelvis. Reproductive: Status post hysterectomy. No adnexal masses. Other: No abdominal wall hernia or abnormality. No abdominopelvic  ascites. Musculoskeletal: Scattered degenerative changes along the spine pelvis. Grade 1 anterolisthesis of L4 on L5. There is compression of the superior endplate of 624THL. This was not seen on the prior examination. This has some acute margins. An acute compression is possible. Please correlate with clinical history. IMPRESSION: No bowel obstruction, free air or free fluid. Normal appendix. Scattered stool and diverticula. Diffuse atherosclerotic changes with mild focal aortic stenosis. 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 This is slightly larger but was seen in 2016. Acute appearing mild compression of the superior endplate of 624THL. Please correlate for any known history of injury. Please see separate dictation of lumbar spine CT. Electronically Signed   By: Jill Side M.D.   On: 07/01/2022 20:16   CT Cervical Spine Wo Contrast  Result Date: 07/01/2022 CLINICAL DATA:  [place] after stress test this morning, neck trauma EXAM: CT CERVICAL SPINE WITHOUT CONTRAST TECHNIQUE: Multidetector CT imaging of the cervical spine was performed without intravenous contrast. Multiplanar CT image reconstructions were also generated. RADIATION DOSE REDUCTION: This exam was performed according to the departmental dose-optimization program which includes automated exposure control, adjustment of the mA and/or kV according to patient size and/or use of iterative reconstruction technique. COMPARISON:  02/21/2021, 09/08/2021 FINDINGS: Alignment: Stable anatomic alignment. Skull base and vertebrae: No acute fracture. No primary bone lesion or focal pathologic process. Soft tissues and spinal canal: No prevertebral fluid or swelling. No visible canal hematoma. Disc levels: Stable C3-4 ACDF. Stable lower cervical spondylosis and facet hypertrophy greatest at the C4-5 level. Upper chest: Airway is patent.  Lung apices are clear. Other: Reconstructed images demonstrate no additional findings. IMPRESSION: 1. No acute cervical  spine fracture. 2. Stable C3-4 ACDF. Stable lower cervical spondylosis and facet hypertrophy. Electronically Signed   By: Randa Ngo  M.D.   On: 07/01/2022 20:09   CT HEAD WO CONTRAST (5MM)  Result Date: 07/01/2022 CLINICAL DATA:  Headache, nausea, and weakness after stress test this morning, fell, hit head EXAM: CT HEAD WITHOUT CONTRAST TECHNIQUE: Contiguous axial images were obtained from the base of the skull through the vertex without intravenous contrast. RADIATION DOSE REDUCTION: This exam was performed according to the departmental dose-optimization program which includes automated exposure control, adjustment of the mA and/or kV according to patient size and/or use of iterative reconstruction technique. COMPARISON:  11/16/2015 FINDINGS: Brain: Confluent hypodensities throughout the periventricular and subcortical white matter consistent with chronic small vessel ischemic change. No evidence of acute infarct or hemorrhage. The lateral ventricles and midline structures are unremarkable. No acute extra-axial fluid collections. No mass effect. Vascular: No hyperdense vessel or unexpected calcification. Skull: Normal. Negative for fracture or focal lesion. Sinuses/Orbits: No acute finding. Other: None. IMPRESSION: 1. No acute intracranial process. Electronically Signed   By: Randa Ngo M.D.   On: 07/01/2022 20:06      IMPRESSION AND PLAN:  Assessment and Plan: * Syncope - The patient will be admitted to an observation medically monitored bed. - Will check orthostatics q 12 hours. - Will obtain a bilateral carotid Doppler and 2D echo. - We will follow serial troponins given her chest pain earlier. - The patient will be gently hydrated with IV normal saline and monitored for arrhythmias. -Differential diagnoses would include neurally mediated syncope, cardiogenic, arrhythmias related,  orthostatic hypotension and less likely hypoglycemia.     T12 compression fracture - Pain management  will be provided. - She will have a back brace. - We will obtain a neurosurgery consult. - I notified Dr. Johnney Killian about the patient.  Chest pain - She had a cardiac stress test yesterday. - Follow-up will be obtained by Dr. Humphrey Rolls or covering physician. - We will follow serial troponins. - We will continue aspirin and beta-blocker therapy as well as statin therapy.  Type 2 diabetes mellitus without complications - We will continue her [place] and place her on supplemental coverage with NovoLog  Depression - We will continue your antidepressants  Dyslipidemia - We will continue statin therapy.  Essential hypertension - We will continue her antihypertensives.       DVT prophylaxis: Lovenox.  Advanced Care Planning:  Code Status: full code.  Family Communication:  The plan of care was discussed in details with the patient (and family). I answered all questions. The patient agreed to proceed with the above mentioned plan. Further management will depend upon hospital course. Disposition Plan: Back to previous home environment Consults called: Neurosurgery and cardiology All the records are reviewed and case discussed with ED provider.  Status is: Observation  I certify that at the time of admission, it is my clinical judgment that the patient will require hospital care extending less than 2 midnights.                            Dispo: The patient is from: Home              Anticipated d/c is to: Home              Patient currently is not medically stable to d/c.              Difficult to place patient: No  Christel Mormon M.D on 07/02/2022 at 4:25 AM  [facility]   From 7 PM-7  AM, contact night-coverage [email or web address]  CC: Primary care physician; Perrin Maltese, MD

## 2022-07-01 NOTE — Progress Notes (Signed)
Neurosurgery brief note  Was contacted by the emergency room regarding this patient.  She is a 82 year old female who had a presyncope event with subjective weakness leading to her falling earlier today and then presented to the emergency room.  She underwent CT scan imaging of the lumbar spine including lower portion of the thoracic fine which demonstrated a superior endplate fracture of 624THL which neurosurgery was engaged.  She is neurologically intact per the report she is going to be admitted to the hospitalist service for further workup.  CT scan imaging is notable for  IMPRESSION: 1. Acute central superior endplate fracture of 624THL resulting in mild (approximately 10-15%) loss of height anteriorly. No retropulsion or listhesis. 2. Multilevel degenerative disc and degenerative joint disease resulting in multilevel neuroforaminal narrowing as described above. 3. 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 This would be better assessed with MRI examination if indicated. 4. Aortic atherosclerosis.   Aortic Atherosclerosis (ICD10-I70.0).     Electronically Signed   By: Fidela Salisbury M.D.   On: 07/01/2022 20:18   AP: With regards to the T12 superior endplate fracture she can be placed in a TLSO brace when mobilizing and is okay to mobilize as tolerated.  With regards to the other multilevel neural degenerative changes given that she is neurologically intact this can be followed clinically and additional studies could be performed as an outpatient as needed.   We will arrange neurosurgery outpatient follow-up.  Luciano Cutter. Johnney Killian, MD Neurosurgery

## 2022-07-01 NOTE — ED Triage Notes (Signed)
Pt to ED via [facility] from [place]. Pt was shopping and started feeling sick and dizzy. Pt reports near-syncopal episdoe. EKG shows RBBB. Pt has not eaten today. 20g RAC.   EMS VS:  BP 87/49 initially and repeat 95/57 HR 70 CBG 152 97% RA

## 2022-07-01 NOTE — ED Notes (Signed)
Pt had a stress test this morning and during the test she wasn't feeling well. Pt told the stress test technician that she wasn't feeling well and they told her the test was almost over. Pt was able to finish the stress test. After the test she went in and started having a headache, nausea, weakness, and started to "fall out". Pt stated she hit her head on the door when she fell. Pt has had a loose stool since she's been in the ER and has c/o right side pain.

## 2022-07-01 NOTE — ED Notes (Signed)
Pt given a food box and water.

## 2022-07-01 NOTE — ED Provider Notes (Signed)
[facility] Provider Note    Event Date/Time   First MD Initiated Contact with Patient 07/01/22 1817     (approximate)   History   Near Syncope   HPI  Helen Mccormick is a 82 y.o. female  with diabetes who comes in with near syncope.  Patient reports that she did not breakfast this morning that she went and had a stress test done.  She states that she did okay during the stress test but then afterwards she went shopping.  She was feeling really hot when shopping, never blacked out but was weak so fell down to ground was what she initially told staff but to me she did report possible LOC.  She did hit her head. No chest pain or SOB. She has had some diarrhea today as well.  She has had some nausea as well. No cough. She did not eat anything today either.  Currently she reports feeling better she does report a mild headache.  Denies any chest pain, shortness of breath.  She does report some abdominal pain earlier when all this happened but this seems mostly resolved at this time.  Denies passing out previously.  She does report some back pain from the fall.  I reviewed patient's cardiology note from 06/05/2022  Physical Exam   Triage Vital Signs: ED Triage Vitals  Enc Vitals Group     BP 07/01/22 1407 (!) 104/51     Pulse Rate 07/01/22 1407 63     Resp 07/01/22 1407 18     Temp 07/01/22 1407 97.8 F (36.6 C)     Temp Source 07/01/22 1844 Oral     SpO2 07/01/22 1407 93 %     Weight 07/01/22 1410 162 lb (73.5 kg)     Height 07/01/22 1410 5\' 6"  (1.676 m)     Head Circumference --      Peak Flow --      Pain Score 07/01/22 1410 8     Pain Loc --      Pain Edu? --      Excl. in [place]? --     Most recent vital signs: Vitals:   07/01/22 1831 07/01/22 1844  BP: (!) 143/87   Pulse: 65   Resp: 17   Temp:  97.6 F (36.4 C)  SpO2: 100%      General: Awake, no distress.  CV:  Good peripheral perfusion.  Resp:  Normal effort.  Abd:  No distention.   Other:  Abdomen soft and nontender.  She has got some low L-spine tenderness.  Equal strength in arms and legs.  Sensation intact throughout.  No obvious C-spine tenderness.   ED Results / Procedures / Treatments   Labs (all labs ordered are listed, but only abnormal results are displayed) Labs Reviewed  BASIC METABOLIC PANEL - Abnormal; Notable for the following components:      Result Value   Glucose, Bld 136 (*)    Calcium 8.7 (*)    All other components within normal limits  CBC - Abnormal; Notable for the following components:   Hemoglobin 11.1 (*)    HCT 35.9 (*)    MCV 79.1 (*)    MCH 24.4 (*)    All other components within normal limits  URINALYSIS, ROUTINE W REFLEX MICROSCOPIC  CBG MONITORING, ED  TROPONIN I (HIGH SENSITIVITY)     EKG  My interpretation of EKG:  Sinus rate of 65 without any ST elevation or T wave  inversions, QTc slightly prolonged at 532.  Left bundle branch block.  Reviewed prior EKGs had similar left bundle branch blocks  RADIOLOGY I have reviewed the CT had personally interpreted no evidence of intracranial hemorrhage  PROCEDURES:  Critical Care performed: No  .1-3 Lead EKG Interpretation  Performed by: Vanessa [place], MD Authorized by: Vanessa [place], MD     Interpretation: abnormal     ECG rate:  59   Rhythm: sinus bradycardia     Ectopy: none     Conduction: normal      MEDICATIONS ORDERED IN ED: Medications  lidocaine (LIDODERM) 5 % 1 patch (has no administration in time range)  acetaminophen (TYLENOL) tablet 1,000 mg (1,000 mg Oral Given 07/01/22 2026)  iohexol (OMNIPAQUE) 300 MG/ML solution 100 mL (100 mLs Intravenous Contrast Given 07/01/22 1948)     IMPRESSION / MDM / [place] / ED COURSE  I reviewed the triage vital signs and the nursing notes.   Patient's presentation is most consistent with acute presentation with potential threat to life or bodily function.   Patient comes in with syncopal episode I  suspect most likely from dehydration related to not eating or drinking as well as getting medications this morning for the stress test.  She denies any active chest pain, shortness of breath.  CT imaging ordered evaluate for intracranial hemorrhage, cervical fracture as well as CT abdomen and back given she reports some abdominal pain earlier as well as some back pain from the fall.  Patient given some Tylenol and lidocaine patch to help with symptoms.  BMP reassuring.  Urine without evidence of UTI.  Troponin was negative.  CBC shows hemoglobin around baseline normal white count.  Initial EKG did have slightly prolonged QTc of 532 but similar left bundle branch block she has some inverted T waves in lead 2 III and aVF  Patient stress test results were not able to to be found.  I did ask cardiology [facility] who is on-call for Dr. Chancy Milroy but they were not able to find the results.  Given the concern for new inferior T wave inversions in the setting of syncopal episode I discussed with patient about admission versus discharge.  Think given the concern for new T wave inversions and would be better to admit patient given unclear stress test results and further discussion with cardiology tomorrow after cardiac monitoring, recurrent troponins.  Patient's CT scan does show new T-spine fracture which I did discuss with Dr. Johnney Killian and will put in TLSO brace.  She has no signs of cord compression on repeat evaluation she has good strength, no numbness, no saddle anesthesia no rectal or urinary incontinence to require emergent MRI.  She can follow-up outpatient with neurosurgery for this.  I did discuss this with patient.   The patient is on the cardiac monitor to evaluate for evidence of arrhythmia and/or significant heart rate changes.      FINAL CLINICAL IMPRESSION(S) / ED DIAGNOSES   Final diagnoses:  Syncope, unspecified syncope type  Closed fracture of twelfth thoracic vertebra, unspecified fracture  morphology, initial encounter     Rx / DC Orders   ED Discharge Orders     None        Note:  This document was prepared using Dragon voice recognition software and may include unintentional dictation errors.   Vanessa [place], MD 07/01/22 2159

## 2022-07-01 NOTE — ED Triage Notes (Signed)
Pt states after a cardiac stress test with the lexiscan, she was feeling warm, approximately at 1200. Pt went shopping and the warmth continued and pt had an episode when she went to the floor. Pt denies loss of consciousness. Pt reports back pain that is worse after the fall.

## 2022-07-02 ENCOUNTER — Observation Stay: Payer: 59

## 2022-07-02 ENCOUNTER — Observation Stay (HOSPITAL_BASED_OUTPATIENT_CLINIC_OR_DEPARTMENT_OTHER)
Admit: 2022-07-02 | Discharge: 2022-07-02 | Disposition: A | Discharge status: Home / Self Care | Payer: 59 | Attending: Family Medicine | Admitting: Family Medicine

## 2022-07-02 DIAGNOSIS — R55 Syncope and collapse: Secondary | ICD-10-CM

## 2022-07-02 DIAGNOSIS — I10 Essential (primary) hypertension: Secondary | ICD-10-CM | POA: Diagnosis not present

## 2022-07-02 DIAGNOSIS — F32A Depression, unspecified: Secondary | ICD-10-CM | POA: Insufficient documentation

## 2022-07-02 DIAGNOSIS — S22080A Wedge compression fracture of T11-T12 vertebra, initial encounter for closed fracture: Secondary | ICD-10-CM | POA: Diagnosis not present

## 2022-07-02 DIAGNOSIS — E119 Type 2 diabetes mellitus without complications: Secondary | ICD-10-CM

## 2022-07-02 DIAGNOSIS — S22089A Unspecified fracture of T11-T12 vertebra, initial encounter for closed fracture: Secondary | ICD-10-CM

## 2022-07-02 DIAGNOSIS — E785 Hyperlipidemia, unspecified: Secondary | ICD-10-CM | POA: Insufficient documentation

## 2022-07-02 LAB — BASIC METABOLIC PANEL
Anion gap: 7 (ref 5–15)
BUN: 18 mg/dL (ref 8–23)
CO2: 25 mmol/L (ref 22–32)
Calcium: 9.2 mg/dL (ref 8.9–10.3)
Chloride: 109 mmol/L (ref 98–111)
Creatinine, Ser: 0.8 mg/dL (ref 0.44–1.00)
GFR, Estimated: >60 mL/min (ref >60)
Glucose, Bld: 99 mg/dL (ref 70–99)
Potassium: 3.4 mmol/L — ABNORMAL LOW (ref 3.5–5.1)
Sodium: 141 mmol/L (ref 135–145)

## 2022-07-02 LAB — CBC
HCT: 38.3 % (ref 36.0–46.0)
Hemoglobin: 12.2 g/dL (ref 12.0–15.0)
MCH: 24.6 pg — ABNORMAL LOW (ref 26.0–34.0)
MCHC: 31.9 g/dL (ref 30.0–36.0)
MCV: 77.2 fL — ABNORMAL LOW (ref 80.0–100.0)
Platelets: 183 10*3/uL (ref 150–400)
RBC: 4.96 MIL/uL (ref 3.87–5.11)
RDW: 14.6 % (ref 11.5–15.5)
WBC: 6.5 10*3/uL (ref 4.0–10.5)
nRBC: 0 % (ref 0.0–0.2)

## 2022-07-02 LAB — CBG MONITORING, ED
Glucose-Capillary: 108 mg/dL — ABNORMAL HIGH (ref 70–99)
Glucose-Capillary: 123 mg/dL — ABNORMAL HIGH (ref 70–99)
Glucose-Capillary: 94 mg/dL (ref 70–99)

## 2022-07-02 LAB — ECHOCARDIOGRAM COMPLETE
AR max vel: 2.48 cm2
AV Area VTI: 2.95 cm2
AV Area mean vel: 2.86 cm2
AV Mean grad: 3 mmHg
AV Peak grad: 5.8 mmHg
Ao pk vel: 1.2 m/s
Area-P 1/2: 2.99 cm2
Height: 66 in
MV VTI: 1.86 cm2
S' Lateral: 3.3 cm
Weight: 2592 oz

## 2022-07-02 LAB — TROPONIN I (HIGH SENSITIVITY)
Troponin I (High Sensitivity): 6 ng/L (ref <18)
Troponin I (High Sensitivity): 9 ng/L (ref <18)
Troponin I (High Sensitivity): 9 ng/L (ref <18)

## 2022-07-02 MED ORDER — MELOXICAM 15 MG PO TABS: ORAL_TABLET | ORAL | 0 refills | Status: DC

## 2022-07-02 MED ORDER — CYCLOBENZAPRINE HCL 5 MG PO TABS: 5.0000 mg | ORAL_TABLET | Freq: Three times a day (TID) | ORAL | 0 refills | Status: DC | PRN

## 2022-07-02 MED ORDER — INSULIN ASPART 100 UNIT/ML IJ SOLN: 0.0000 [IU] | Freq: Every day | INTRAMUSCULAR | Status: DC

## 2022-07-02 MED ORDER — INSULIN ASPART 100 UNIT/ML IJ SOLN
0.0000 [IU] | Freq: Three times a day (TID) | INTRAMUSCULAR | Status: DC
  Administered 2022-07-02: 2 [IU] via SUBCUTANEOUS
  Filled 2022-07-02: qty 1

## 2022-07-02 MED ORDER — POTASSIUM CHLORIDE CRYS ER 20 MEQ PO TBCR
40.0000 meq | EXTENDED_RELEASE_TABLET | Freq: Once | ORAL | Status: AC
  Administered 2022-07-02: 40 meq via ORAL
  Filled 2022-07-02: qty 2

## 2022-07-02 NOTE — Progress Notes (Signed)
Orthopedic Tech Progress Note Patient Details:  Helen Mccormick [facility] January 29, 1941 [id number]  Order for TLSO called into [facility]. Per Dr. Soundra Pilon, the pt is being discharged today.  Patient ID: Helen Mccormick, female   DOB: Jan 26, 1941, 82 y.o.   MRN: [id number]  [name] 07/02/2022, 12:49 PM

## 2022-07-02 NOTE — Assessment & Plan Note (Signed)
-   We will continue statin therapy. 

## 2022-07-02 NOTE — Assessment & Plan Note (Signed)
-   We will continue her antihypertensives. 

## 2022-07-02 NOTE — Assessment & Plan Note (Signed)
-   The patient will be admitted to an observation medically monitored bed. - Will check orthostatics q 12 hours. - Will obtain a bilateral carotid Doppler and 2D echo. - We will follow serial troponins given her chest pain earlier. - The patient will be gently hydrated with IV normal saline and monitored for arrhythmias. -Differential diagnoses would include neurally mediated syncope, cardiogenic, arrhythmias related,  orthostatic hypotension and less likely hypoglycemia.

## 2022-07-02 NOTE — Assessment & Plan Note (Signed)
-   We will continue her Wilder Glade and place her on supplemental coverage with NovoLog

## 2022-07-02 NOTE — Hospital Course (Addendum)
Taken from H&P.  Helen Mccormick Start is a 82 y.o. female with medical history significant for hypertension, dyslipidemia, hypothyroidism, OSA, hypothyroidism, CHF and anxiety, who presented to the emergency room with acute onset of syncope while shopping today.  Prior to that she had a cardiac stress test at Dr. Laurelyn Sickle office during which she experienced chest pain that she graded 10/10 with radiation to her left arm.  She stated that when she had syncope she fell on her side and had subsequent mid back pain.   ED Course: When she came to the ER, BP was 143/87 with otherwise normal vital signs.  Labs revealed unremarkable BMP and LFTs.  High sensitive troponin I was 5 and later 6.  CBC showed mild anemia with microcytosis.  UA showed more than 500 glucose and specific gravity that was elevated at 1037. EKG showed normal sinus rhythm with a rate of 63 with left bundle branch block. Imaging: Noncontrasted head CT scan revealed no acute intracranial process.  C-spine CT showed no evidence for spine fracture.  It showed stable C3-4 ACDF with stable lower cervical spondylosis and facet hyper prophy.  Abdominal and pelvic CT scan revealed the following: No bowel obstruction, free air or free fluid. Normal appendix. Scattered stool and diverticula.   Diffuse atherosclerotic changes with mild focal aortic stenosis.   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 This is slightly larger but was seen in 2016.   Acute appearing mild compression of the superior endplate of 624THL. Please correlate for any known history of injury. Please see separate dictation of lumbar spine CT.  Neurosurgery was also consulted for T12 endplate compression fracture and they were recommending TLSO and activity as tolerated.  They will follow-up as outpatient.  4/4: Blood pressure elevated at 176/95 this morning.  Carotid ultrasound with no evidence of any significant stenosis.  Echocardiogram with low normal EF, grade 1  diastolic dysfunction and no other significant abnormality. Discussed with her cardiologist on secure chat and apparently she had a fixed defect on stress testing which they will take care of as an outpatient.  Patient with no significant back pain and appears to be at baseline now.  She wants to go home.  TLSO brace was provided and she was given Mobic and Flexeril to use as needed for pain.  PT is recommending outpatient therapy.  Patient will need to follow-up with neurosurgery as an outpatient for further recommendations regarding her T12 compression fracture.  She will continue with rest of her home medications.  Her medication list a lot of medications which apparently she was not using and they were discontinued.  Patient need to follow-up closely with her providers for further recommendations.

## 2022-07-02 NOTE — TOC Transition Note (Signed)
Transition of Care [facility]) - CM/SW Discharge Note   Patient Details  Name: Helen Mccormick MRN: [id number] Date of Birth: [birth year]  [facility]) CM/SW Contact:  Tiburcio Bash, LCSW Phone Number: 07/02/2022, 2:31 PM   Clinical Narrative:     CSW spoke with patient's son Marland Kitchen who reports he is going to be picking patient up today, agreeable for outpatient PT referral to [facility] [place] has faxed referral. He reports no other dc needs, RN made aware.   Final next level of care:  (oupatient therapy) Barriers to Discharge: No Barriers Identified   Patient Goals and CMS Choice CMS Medicare.gov Compare Post Acute Care list provided to:: Patient Choice offered to / list presented to : Patient  Discharge Placement                         Discharge Plan and Services Additional resources added to the After Visit Summary for                                       Social Determinants of Health (SDOH) Interventions SDOH Screenings   Food Insecurity: No Food Insecurity (04/27/2017)  Transportation Needs: No Transportation Needs (04/27/2017)  Depression (PHQ2-9): Low Risk  (09/26/2018)  Financial Resource Strain: Low Risk  (04/27/2017)  Physical Activity: Sufficiently Active (12/20/2018)  Social Connections: Moderately Integrated (04/27/2017)  Stress: No Stress Concern Present (03/12/2017)  Tobacco Use: Medium Risk (06/05/2022)     Readmission Risk Interventions     No data to display

## 2022-07-02 NOTE — Progress Notes (Signed)
     [place] REFERRAL        Occupational Therapy * Physical Therapy * Speech Therapy                           DATE 07/02/22  PATIENT NAME   Helen Mccormick  PATIENT MRN [id number]  DIAGNOSIS/DIAGNOSIS CODE R55  DATE OF DISCHARGE: 07/02/22       PRIMARY CARE PHYSICIAN     Dr. Lamonte Sakai   PCP PHONE/FAX : [phone]     Dear Provider (Name: [facility] outpatient __  Fax: [phone]   I certify that I have examined this patient and that occupational/physical/speech therapy is necessary on an outpatient basis.    The patient has expressed interest in completing their recommended course of therapy at your location.  Once a formal order from the patient's primary care physician has been obtained, please contact him/her to schedule an appointment for evaluation at your earliest convenience.   [ X]  Physical Therapy Evaluate and Treat  [  ]  Occupational Therapy Evaluate and Treat  [  ]  Speech Therapy Evaluate and Treat         The patient's primary care physician (listed above) must furnish and be responsible for a formal order such that the recommended services may be furnished while under the primary physician's care, and that the plan of care will be established and reviewed every 30 days (or more often if condition necessitates).

## 2022-07-02 NOTE — Discharge Summary (Signed)
Physician Discharge Summary   Patient: Helen Mccormick MRN: [id number] DOB: 12-17-1940  Admit date:     07/01/2022  Discharge date: 07/02/22  Discharge Physician: Lorella Nimrod   PCP: Perrin Maltese, MD   Recommendations at discharge:  Follow-up with cardiology according to the schedule appointment. Follow-up with neurosurgery Follow-up with primary care provider  Discharge Diagnoses: Principal Problem:   Syncope Active Problems:   Compression fracture of T12 vertebra   Chest pain   Essential hypertension   Dyslipidemia   Depression   Type 2 diabetes mellitus without complications   Hospital Course: Taken from H&P.  Helen Mccormick is a 82 y.o. female with medical history significant for hypertension, dyslipidemia, hypothyroidism, OSA, hypothyroidism, CHF and anxiety, who presented to the emergency room with acute onset of syncope while shopping today.  Prior to that she had a cardiac stress test at Dr. Laurelyn Sickle office during which she experienced chest pain that she graded 10/10 with radiation to her left arm.  She stated that when she had syncope she fell on her side and had subsequent mid back pain.   ED Course: When she came to the ER, BP was 143/87 with otherwise normal vital signs.  Labs revealed unremarkable BMP and LFTs.  High sensitive troponin I was 5 and later 6.  CBC showed mild anemia with microcytosis.  UA showed more than 500 glucose and specific gravity that was elevated at 1037. EKG showed normal sinus rhythm with a rate of 63 with left bundle branch block. Imaging: Noncontrasted head CT scan revealed no acute intracranial process.  C-spine CT showed no evidence for spine fracture.  It showed stable C3-4 ACDF with stable lower cervical spondylosis and facet hyper prophy.  Abdominal and pelvic CT scan revealed the following: No bowel obstruction, free air or free fluid. Normal appendix. Scattered stool and diverticula.   Diffuse atherosclerotic changes with mild  focal aortic stenosis.   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 This is slightly larger but was seen in 2016.   Acute appearing mild compression of the superior endplate of 624THL. Please correlate for any known history of injury. Please see separate dictation of lumbar spine CT.  Neurosurgery was also consulted for T12 endplate compression fracture and they were recommending TLSO and activity as tolerated.  They will follow-up as outpatient.  4/4: Blood pressure elevated at 176/95 this morning.  Carotid ultrasound with no evidence of any significant stenosis.  Echocardiogram with low normal EF, grade 1 diastolic dysfunction and no other significant abnormality. Discussed with her cardiologist on secure chat and apparently she had a fixed defect on stress testing which they will take care of as an outpatient.  Patient with no significant back pain and appears to be at baseline now.  She wants to go home.  TLSO brace was provided and she was given Mobic and Flexeril to use as needed for pain.  PT is recommending outpatient therapy.  Patient will need to follow-up with neurosurgery as an outpatient for further recommendations regarding her T12 compression fracture.  She will continue with rest of her home medications.  Her medication list a lot of medications which apparently she was not using and they were discontinued.  Patient need to follow-up closely with her providers for further recommendations.    Assessment and Plan: * Syncope - The patient will be admitted to an observation medically monitored bed. - Will check orthostatics q 12 hours. - Will obtain a bilateral carotid Doppler and  2D echo. - We will follow serial troponins given her chest pain earlier. - The patient will be gently hydrated with IV normal saline and monitored for arrhythmias. -Differential diagnoses would include neurally mediated syncope, cardiogenic, arrhythmias related,  orthostatic hypotension  and less likely hypoglycemia.     Compression fracture of T12 vertebra - Pain management will be provided. - She will have a back brace. - We will obtain a neurosurgery consult. - I notified Dr. Johnney Killian about the patient.  Chest pain - She had a cardiac stress test yesterday. - Follow-up will be obtained by Dr. Humphrey Rolls or covering physician. - We will follow serial troponins. - We will continue aspirin and beta-blocker therapy as well as statin therapy.  Type 2 diabetes mellitus without complications - We will continue her Wilder Glade and place her on supplemental coverage with NovoLog  Depression - We will continue your antidepressants  Dyslipidemia - We will continue statin therapy.  Essential hypertension - We will continue her antihypertensives.   Consultants: Neurosurgery Procedures performed: None Disposition: Home Diet recommendation:  Discharge Diet Orders (From admission, onward)     Start     Ordered   07/02/22 0000  Diet - low sodium heart healthy        07/02/22 1355           Cardiac and Carb modified diet DISCHARGE MEDICATION: Allergies as of 07/02/2022       Reactions   Amlodipine Hives, Swelling   Tramadol Other (See Comments)   Dizziness   Isosorbide Dinitrate    Other Reaction(s): Headache   Codeine Nausea Only   Etodolac Other (See Comments)   Other reaction(s): Abdominal Pain        Medication List     STOP taking these medications    atenolol 50 MG tablet Commonly known as: TENORMIN   atorvastatin 20 MG tablet Commonly known as: LIPITOR   Baclofen 5 MG Tabs   betamethasone dipropionate 0.05 % cream   calcium carbonate 600 MG Tabs tablet Commonly known as: OS-CAL   cefdinir 300 MG capsule Commonly known as: OMNICEF   cetirizine 10 MG tablet Commonly known as: ZYRTEC   diclofenac Sodium 1 % Gel Commonly known as: VOLTAREN   dicyclomine 10 MG capsule Commonly known as: BENTYL   ergocalciferol 1.25 MG (50000 UT)  capsule Commonly known as: VITAMIN D2   famotidine 20 MG tablet Commonly known as: PEPCID   Flax Seed Oil 1000 MG Caps   fluticasone 50 MCG/ACT nasal spray Commonly known as: FLONASE   Garlic [id number] MG Caps   hydrochlorothiazide 12.5 MG tablet Commonly known as: HYDRODIURIL   HYDROcodone-acetaminophen 5-325 MG tablet Commonly known as: NORCO/VICODIN   Hyoscyamine Sulfate SL 0.125 MG Subl Commonly known as: Levsin/SL   metoprolol succinate 25 MG 24 hr tablet Commonly known as: TOPROL-XL   Omega-3 1000 MG Caps   omeprazole 40 MG capsule Commonly known as: PRILOSEC   predniSONE 10 MG tablet Commonly known as: DELTASONE   PRESERVISION AREDS 2 PO   tiZANidine 2 MG tablet Commonly known as: ZANAFLEX   traMADol 50 MG tablet Commonly known as: ULTRAM       TAKE these medications    Accu-Chek Guide w/Device Kit See admin instructions.   acetaminophen 325 MG tablet Commonly known as: TYLENOL Take 2 tablets (650 mg total) by mouth every 6 (six) hours as needed.   albuterol 108 (90 Base) MCG/ACT inhaler Commonly known as: VENTOLIN HFA Inhale into the lungs every 6 (six)  hours as needed for wheezing or shortness of breath.   amLODipine 5 MG tablet Commonly known as: NORVASC TAKE 1 TABLET(5 MG) BY MOUTH DAILY FOR BLOOD PRESSURE   aspirin 81 MG chewable tablet Chew 81 mg by mouth daily.   Azelastine HCl 137 MCG/SPRAY Soln SMARTSIG:1-2 Spray(s) Both Nares Twice Daily   busPIRone 15 MG tablet Commonly known as: BUSPAR TAKE 1 TABLET BY MOUTH TWICE A DAY What changed: Another medication with the same name was removed. Continue taking this medication, and follow the directions you see here.   carvedilol 12.5 MG tablet Commonly known as: COREG Take 12.5 mg by mouth 2 (two) times daily.   cyclobenzaprine 5 MG tablet Commonly known as: FLEXERIL Take 1 tablet (5 mg total) by mouth 3 (three) times daily as needed for muscle spasms.   Entresto 24-26 MG Generic  drug: sacubitril-valsartan Take 1 tablet by mouth 2 (two) times daily.   escitalopram 10 MG tablet Commonly known as: LEXAPRO TAKE 1 TABLET BY MOUTH EVERY DAY FOR DEPRESSION What changed: See the new instructions.   Farxiga 10 MG Tabs tablet Generic drug: dapagliflozin propanediol Take 10 mg by mouth daily.   Flovent HFA 44 MCG/ACT inhaler Generic drug: fluticasone SMARTSIG:2 Puff(s) By Mouth Twice Daily   glucose blood test strip Use 1 strip to check your blood sugar two times daily.   isosorbide mononitrate 30 MG 24 hr tablet Commonly known as: IMDUR TAKE 1 TABLET(30 MG) BY MOUTH DAILY What changed: See the new instructions.   levothyroxine 75 MCG tablet Commonly known as: SYNTHROID TAKE 1 TABLET BY MOUTH DAILY BEFORE BREAKFAST FOR THYROID What changed: Another medication with the same name was removed. Continue taking this medication, and follow the directions you see here.   meloxicam 15 MG tablet Commonly known as: MOBIC meloxicam 15 mg tablet  Take 1 tablet every day as needed for pain What changed: See the new instructions.   nitroGLYCERIN 0.4 MG SL tablet Commonly known as: NITROSTAT Place 1 tablet (0.4 mg total) under the tongue every 5 (five) minutes as needed for chest pain.   pantoprazole 20 MG tablet Commonly known as: Protonix Take 1 tablet (20 mg total) by mouth daily. FOR ACID REFLUX AND STOMACH PAIN What changed: Another medication with the same name was removed. Continue taking this medication, and follow the directions you see here.   polyethylene glycol powder 17 GM/SCOOP powder Commonly known as: GLYCOLAX/MIRALAX polyethylene glycol 3350 17 gram/dose oral powder  TK  PO ONCE FOR 1 DOSE   Restasis 0.05 % ophthalmic emulsion Generic drug: cycloSPORINE Place 1 drop into both eyes 2 (two) times daily.   rosuvastatin 40 MG tablet Commonly known as: CRESTOR Take 40 mg by mouth daily.        Follow-up Information     Perrin Maltese, MD.  Schedule an appointment as soon as possible for a visit in 1 week(s).   Specialty: Internal Medicine Contact information: [address] [place] [address] [phone]         Cannon Kettle, MD. Schedule an appointment as soon as possible for a visit in 1 week(s).   Specialty: Neurosurgery Contact information: [place] [id number] [phone]                Discharge Exam: Danley Danker Weights   07/01/22 1410  Weight: 73.5 kg   General.  Well-developed lady, in no acute distress. Pulmonary.  Lungs clear bilaterally, normal respiratory effort. CV.  Regular  rate and rhythm, no JVD, rub or murmur. Abdomen.  Soft, nontender, nondistended, BS positive. CNS.  Alert and oriented .  No focal neurologic deficit. Extremities.  No edema, no cyanosis, pulses intact and symmetrical. Psychiatry.  Judgment and insight appears normal.   Condition at discharge: stable  The results of significant diagnostics from this hospitalization (including imaging, microbiology, ancillary and laboratory) are listed below for reference.   Imaging Studies: ECHOCARDIOGRAM COMPLETE  Result Date: 07/02/2022    ECHOCARDIOGRAM REPORT   Patient Name:   JERLENE VINSANT Date of Exam: 07/02/2022 Medical Rec #:  [id number]         Height:       66.0 in Accession #:    [id number]        Weight:       162.0 lb Date of Birth:  02-02-1941          BSA:          1.828 m Patient Age:    44 years          BP:           176/85 mmHg Patient Gender: F                 HR:           76 bpm. Exam Location:  [facility] Procedure: 2D Echo, Cardiac Doppler and Color Doppler Indications:     Syncope  History:         Patient has no prior history of Echocardiogram examinations.                  Cardiomyopathy, Signs/Symptoms:Syncope, Chest Pain and                  Dizziness/Lightheadedness; Risk Factors:Hypertension, Diabetes,                  Sleep Apnea and Dyslipidemia.  Sonographer:     Wenda Low Referring  Phys:  [id number] [place] Diagnosing Phys: Kathlyn Sacramento MD  Sonographer Comments: Image acquisition challenging due to respiratory motion. IMPRESSIONS  1. Left ventricular ejection fraction, by estimation, is 55 to 60%. The left ventricle has normal function. Left ventricular endocardial border not optimally defined to evaluate regional wall motion. There is mild left ventricular hypertrophy. Left ventricular diastolic parameters are consistent with Grade I diastolic dysfunction (impaired relaxation).  2. Right ventricular systolic function is normal. The right ventricular size is normal.  3. The mitral valve is normal in structure. Mild mitral valve regurgitation. No evidence of mitral stenosis.  4. The aortic valve is normal in structure. Aortic valve regurgitation is not visualized. Aortic valve sclerosis is present, with no evidence of aortic valve stenosis.  5. The inferior vena cava is normal in size with greater than 50% respiratory variability, suggesting right atrial pressure of 3 mmHg. FINDINGS  Left Ventricle: Left ventricular ejection fraction, by estimation, is 55 to 60%. The left ventricle has normal function. Left ventricular endocardial border not optimally defined to evaluate regional wall motion. The left ventricular internal cavity size was normal in size. There is mild left ventricular hypertrophy. Left ventricular diastolic parameters are consistent with Grade I diastolic dysfunction (impaired relaxation). Right Ventricle: The right ventricular size is normal. No increase in right ventricular wall thickness. Right ventricular systolic function is normal. Left Atrium: Left atrial size was normal in size. Right Atrium: Right atrial size was normal in size. Pericardium: There is no evidence of pericardial effusion. Mitral  Valve: The mitral valve is normal in structure. There is moderate thickening of the mitral valve leaflet(s). Mild mitral valve regurgitation. No evidence of mitral valve  stenosis. MV peak gradient, 8.5 mmHg. The mean mitral valve gradient is 3.0 mmHg. Tricuspid Valve: The tricuspid valve is normal in structure. Tricuspid valve regurgitation is not demonstrated. No evidence of tricuspid stenosis. Aortic Valve: The aortic valve is normal in structure. Aortic valve regurgitation is not visualized. Aortic valve sclerosis is present, with no evidence of aortic valve stenosis. Aortic valve mean gradient measures 3.0 mmHg. Aortic valve peak gradient measures 5.8 mmHg. Aortic valve area, by VTI measures 2.95 cm. Pulmonic Valve: The pulmonic valve was normal in structure. Pulmonic valve regurgitation is not visualized. No evidence of pulmonic stenosis. Aorta: The aortic root is normal in size and structure. Venous: The inferior vena cava is normal in size with greater than 50% respiratory variability, suggesting right atrial pressure of 3 mmHg. IAS/Shunts: No atrial level shunt detected by color flow Doppler.  LEFT VENTRICLE PLAX 2D LVIDd:         4.30 cm   Diastology LVIDs:         3.30 cm   LV e' medial:    6.74 cm/s LV PW:         1.30 cm   LV E/e' medial:  10.4 LV IVS:        1.30 cm   LV e' lateral:   5.87 cm/s LVOT diam:     1.90 cm   LV E/e' lateral: 12.0 LV SV:         67 LV SV Index:   37 LVOT Area:     2.84 cm  RIGHT VENTRICLE RV Basal diam:  3.25 cm RV Mid diam:    2.50 cm RV S prime:     16.00 cm/s TAPSE (M-mode): 2.6 cm LEFT ATRIUM             Index        RIGHT ATRIUM           Index LA diam:        3.50 cm 1.91 cm/m   RA Area:     14.30 cm LA Vol (A2C):   55.0 ml 30.08 ml/m  RA Volume:   36.30 ml  19.85 ml/m LA Vol (A4C):   51.5 ml 28.17 ml/m LA Biplane Vol: 56.3 ml 30.79 ml/m  AORTIC VALVE                    PULMONIC VALVE AV Area (Vmax):    2.48 cm     PV Vmax:       1.22 m/s AV Area (Vmean):   2.86 cm     PV Peak grad:  6.0 mmHg AV Area (VTI):     2.95 cm AV Vmax:           120.00 cm/s AV Vmean:          70.400 cm/s AV VTI:            0.227 m AV Peak Grad:       5.8 mmHg AV Mean Grad:      3.0 mmHg LVOT Vmax:         105.00 cm/s LVOT Vmean:        71.000 cm/s LVOT VTI:          0.236 m LVOT/AV VTI ratio: 1.04  AORTA Ao Root diam: 3.80 cm MITRAL VALVE MV Area (PHT):  2.99 cm     SHUNTS MV Area VTI:   1.86 cm     Systemic VTI:  0.24 m MV Peak grad:  8.5 mmHg     Systemic Diam: 1.90 cm MV Mean grad:  3.0 mmHg MV Vmax:       1.46 m/s MV Vmean:      70.7 cm/s MV Decel Time: 254 msec MV E velocity: 70.40 cm/s MV A velocity: 141.00 cm/s MV E/A ratio:  0.50 Kathlyn Sacramento MD Electronically signed by Kathlyn Sacramento MD Signature Date/Time: 07/02/2022/11:35:42 AM    Final    US Carotid Bilateral  Result Date: 07/02/2022 CLINICAL DATA:  Syncope EXAM: BILATERAL CAROTID DUPLEX ULTRASOUND TECHNIQUE: Pearline Cables scale imaging, color Doppler and duplex ultrasound were performed of bilateral carotid and vertebral arteries in the neck. COMPARISON:  None Available. FINDINGS: Criteria: Quantification of carotid stenosis is based on velocity parameters that correlate the residual internal carotid diameter with NASCET-based stenosis levels, using the diameter of the distal internal carotid lumen as the denominator for stenosis measurement. The following velocity measurements were obtained: RIGHT ICA: 48/8 cm/sec CCA: [id number] cm/sec SYSTOLIC ICA/CCA RATIO:  0.9 ECA:  68 cm/sec LEFT ICA: 53/14 cm/sec CCA: [id number] cm/sec SYSTOLIC ICA/CCA RATIO:  1.0 ECA:  73 cm/sec RIGHT CAROTID ARTERY: Trace smooth atherosclerotic plaque in the proximal internal carotid artery. No significant stenosis. RIGHT VERTEBRAL ARTERY:  Patent with normal antegrade flow. LEFT CAROTID ARTERY: No atherosclerotic plaque or evidence of stenosis. LEFT VERTEBRAL ARTERY:  Patent with normal antegrade flow. IMPRESSION: Trace smooth heterogeneous atherosclerotic plaque in the proximal right internal carotid artery without evidence of associated stenosis. No evidence of plaque or stenosis in the left internal carotid artery. Vertebral arteries  are patent with normal antegrade flow. Electronically Signed   By: Jacqulynn Cadet M.D.   On: 07/02/2022 05:57   CT L-SPINE NO CHARGE  Result Date: 07/01/2022 CLINICAL DATA:  Fall, right back pain EXAM: CT LUMBAR SPINE WITHOUT CONTRAST TECHNIQUE: Multidetector CT imaging of the lumbar spine was performed without intravenous contrast administration. Multiplanar CT image reconstructions were also generated. RADIATION DOSE REDUCTION: This exam was performed according to the departmental dose-optimization program which includes automated exposure control, adjustment of the mA and/or kV according to patient size and/or use of iterative reconstruction technique. COMPARISON:  CT abdomen pelvis 04/24/2022 FINDINGS: Segmentation: 5 lumbar type vertebrae. Alignment: 3-4 mm anterolisthesis L3-4 and L4-5 and 3 mm retrolisthesis L5-S1 noted, likely degenerative in nature. Otherwise normal lumbar lordosis. Vertebrae: There is an acute central superior endplate fracture of 624THL which is not involve the posterior wall and results in mild (approximately 10-15%) loss of height anteriorly. No retropulsion or listhesis. The posterior elements appear intact. No other fracture identified. No suspicious lytic or blastic bone lesion. Paraspinal and other soft tissues: No paraspinal fluid collection or inflammatory change. Moderate aortoiliac mixed atherosclerotic plaque. Disc levels: Mild intervertebral disc space narrowing and endplate remodeling at 075-GRM and vacuum disc phenomena at L5-S1 is present in keeping with changes of mild to moderate degenerative disc disease. Remaining intervertebral disc heights are preserved. Axial images demonstrate: T11-T12: Moderate facet arthrosis. Resultant severe right neuroforaminal narrowing. No canal stenosis. T12-L1: No neuroforaminal narrowing. No canal stenosis. Mild bilateral facet arthrosis. L1-2: Mild bilateral facet arthrosis. No neuroforaminal narrowing. No canal stenosis. L2-3: Mild facet  arthrosis. No neuroforaminal narrowing. No canal stenosis. L3-4: Mild broad-based disc bulge. Moderate bilateral facet arthrosis. No neuroforaminal narrowing. Minimal canal stenosis. L4-5: Moderate broad-based disc bulge. Marked bilateral facet arthrosis. Resultant  mild bilateral neuroforaminal narrowing and moderate central canal stenosis with possible impingement of the lateral recesses bilaterally and crossing L5 nerve roots. L5-S1 mild bilateral facet arthrosis. No neuroforaminal narrowing. No canal stenosis. IMPRESSION: 1. Acute central superior endplate fracture of 624THL resulting in mild (approximately 10-15%) loss of height anteriorly. No retropulsion or listhesis. 2. Multilevel degenerative disc and degenerative joint disease resulting in multilevel neuroforaminal narrowing as described above. 3. 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 This would be better assessed with MRI examination if indicated. 4. Aortic atherosclerosis. Aortic Atherosclerosis (ICD10-I70.0). Electronically Signed   By: Fidela Salisbury M.D.   On: 07/01/2022 20:18   CT ABDOMEN PELVIS W CONTRAST  Result Date: 07/01/2022 CLINICAL DATA:  Abdominal pain. EXAM: CT ABDOMEN AND PELVIS WITH CONTRAST TECHNIQUE: Multidetector CT imaging of the abdomen and pelvis was performed using the standard protocol following bolus administration of intravenous contrast. RADIATION DOSE REDUCTION: This exam was performed according to the departmental dose-optimization program which includes automated exposure control, adjustment of the mA and/or kV according to patient size and/or use of iterative reconstruction technique. CONTRAST:  154mL OMNIPAQUE IOHEXOL 300 MG/ML  SOLN COMPARISON:  CT 04/24/2022 and older FINDINGS: Lower chest: There is some linear opacity lung bases likely scar or atelectasis. Small air cysts at the left lung base laterally on series 4, image 25,  unchanged from previous. Hepatobiliary: Eventration of the right hemidiaphragm. There is focal fat deposition seen in segment 4 of the liver adjacent to the falciform ligament. No other space-occupying liver lesion. Patent portal vein. Pancreas: Moderate atrophy of the pancreas. Spleen: The spleen is nonenlarged.  Small splenule. Adrenals/Urinary Tract: Left adrenal gland is preserved. There is a right adrenal gland nodule measuring 11 mm on series 2, image 20. Hounsfield unit of 103 on portal venous phase and on delay 57. This has a relative washout of 44% and is highly suggestive of an adenoma. This was present but smaller in 2016 on a CT scan. No enhancing renal mass or collecting system dilatation. The ureters have normal course and caliber down to the bladder. Preserved contours of the urinary bladder. Stomach/Bowel: Scattered colonic stool. Few colonic diverticula. No bowel obstruction, free air or free fluid. Normal appendix. The stomach and small bowel are nondilated. Vascular/Lymphatic: Scattered atherosclerotic calcified plaque along the aorta and branch vessels. There is a focal stenosis along the middle abdominal aorta diameter approaching 50%. Patent lumen narrows to short axis of 8 mm. Normal caliber IVC. There are areas of stenosis along the renal arteries. Please correlate for level of hypertension. No specific abnormal lymph node enlargement identified in the abdomen and pelvis. Reproductive: Status post hysterectomy. No adnexal masses. Other: No abdominal wall hernia or abnormality. No abdominopelvic ascites. Musculoskeletal: Scattered degenerative changes along the spine pelvis. Grade 1 anterolisthesis of L4 on L5. There is compression of the superior endplate of 624THL. This was not seen on the prior examination. This has some acute margins. An acute compression is possible. Please correlate with clinical history. IMPRESSION: No bowel obstruction, free air or free fluid. Normal appendix. Scattered  stool and diverticula. Diffuse atherosclerotic changes with mild focal aortic stenosis. 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 This is slightly larger but was seen in 2016. Acute appearing mild compression of the superior endplate of 624THL. Please correlate for any known history of injury. Please see separate dictation of lumbar spine CT. Electronically Signed   By: Roosevelt Locks  Lyndel Safe M.D.   On: 07/01/2022 20:16   CT Cervical Spine Wo Contrast  Result Date: 07/01/2022 CLINICAL DATA:  [place] after stress test this morning, neck trauma EXAM: CT CERVICAL SPINE WITHOUT CONTRAST TECHNIQUE: Multidetector CT imaging of the cervical spine was performed without intravenous contrast. Multiplanar CT image reconstructions were also generated. RADIATION DOSE REDUCTION: This exam was performed according to the departmental dose-optimization program which includes automated exposure control, adjustment of the mA and/or kV according to patient size and/or use of iterative reconstruction technique. COMPARISON:  02/21/2021, 09/08/2021 FINDINGS: Alignment: Stable anatomic alignment. Skull base and vertebrae: No acute fracture. No primary bone lesion or focal pathologic process. Soft tissues and spinal canal: No prevertebral fluid or swelling. No visible canal hematoma. Disc levels: Stable C3-4 ACDF. Stable lower cervical spondylosis and facet hypertrophy greatest at the C4-5 level. Upper chest: Airway is patent.  Lung apices are clear. Other: Reconstructed images demonstrate no additional findings. IMPRESSION: 1. No acute cervical spine fracture. 2. Stable C3-4 ACDF. Stable lower cervical spondylosis and facet hypertrophy. Electronically Signed   By: Randa Ngo M.D.   On: 07/01/2022 20:09   CT HEAD WO CONTRAST (5MM)  Result Date: 07/01/2022 CLINICAL DATA:  Headache, nausea, and weakness after stress test this morning, fell, hit head EXAM: CT HEAD WITHOUT CONTRAST TECHNIQUE: Contiguous axial images were obtained from the  base of the skull through the vertex without intravenous contrast. RADIATION DOSE REDUCTION: This exam was performed according to the departmental dose-optimization program which includes automated exposure control, adjustment of the mA and/or kV according to patient size and/or use of iterative reconstruction technique. COMPARISON:  11/16/2015 FINDINGS: Brain: Confluent hypodensities throughout the periventricular and subcortical white matter consistent with chronic small vessel ischemic change. No evidence of acute infarct or hemorrhage. The lateral ventricles and midline structures are unremarkable. No acute extra-axial fluid collections. No mass effect. Vascular: No hyperdense vessel or unexpected calcification. Skull: Normal. Negative for fracture or focal lesion. Sinuses/Orbits: No acute finding. Other: None. IMPRESSION: 1. No acute intracranial process. Electronically Signed   By: Randa Ngo M.D.   On: 07/01/2022 20:06    Microbiology: Results for orders placed or performed during the hospital encounter of 02/21/21  Resp Panel by RT-PCR (Flu A&B, Covid) Nasopharyngeal Swab     Status: None   Collection Time: 02/21/21 10:51 AM   Specimen: Nasopharyngeal Swab; Nasopharyngeal(NP) swabs in vial transport medium  Result Value Ref Range Status   SARS Coronavirus 2 by RT PCR NEGATIVE NEGATIVE Final    Comment: (NOTE) SARS-CoV-2 target nucleic acids are NOT DETECTED.  The SARS-CoV-2 RNA is generally detectable in upper respiratory specimens during the acute phase of infection. The lowest concentration of SARS-CoV-2 viral copies this assay can detect is 138 copies/mL. A negative result does not preclude SARS-Cov-2 infection and should not be used as the sole basis for treatment or other patient management decisions. A negative result may occur with  improper specimen collection/handling, submission of specimen other than nasopharyngeal swab, presence of viral mutation(s) within the areas  targeted by this assay, and inadequate number of viral copies(<138 copies/mL). A negative result must be combined with clinical observations, patient history, and epidemiological information. The expected result is Negative.  Fact Sheet for Patients:  EntrepreneurPulse.com.au  Fact Sheet for Healthcare Providers:  IncredibleEmployment.be  This test is no t yet approved or cleared by the [place] FDA and  has been authorized for detection and/or diagnosis of SARS-CoV-2 by FDA under an Emergency Use Authorization (EUA).  This EUA will remain  in effect (meaning this test can be used) for the duration of the COVID-19 declaration under Section 564(b)(1) of the Act, 21 U.S.C.section 360bbb-3(b)(1), unless the authorization is terminated  or revoked sooner.       Influenza A by PCR NEGATIVE NEGATIVE Final   Influenza B by PCR NEGATIVE NEGATIVE Final    Comment: (NOTE) The Xpert Xpress SARS-CoV-2/FLU/RSV plus assay is intended as an aid in the diagnosis of influenza from Nasopharyngeal swab specimens and should not be used as a sole basis for treatment. Nasal washings and aspirates are unacceptable for Xpert Xpress SARS-CoV-2/FLU/RSV testing.  Fact Sheet for Patients: EntrepreneurPulse.com.au  Fact Sheet for Healthcare Providers: IncredibleEmployment.be  This test is not yet approved or cleared by the [place] FDA and has been authorized for detection and/or diagnosis of SARS-CoV-2 by FDA under an Emergency Use Authorization (EUA). This EUA will remain in effect (meaning this test can be used) for the duration of the COVID-19 declaration under Section 564(b)(1) of the Act, 21 U.S.C. section 360bbb-3(b)(1), unless the authorization is terminated or revoked.  Performed at [facility], [address] [place], [place] [address]     Labs: CBC: Recent Labs  Lab 07/01/22 1416  07/02/22 0441  WBC 5.1 6.5  HGB 11.1* 12.2  HCT 35.9* 38.3  MCV 79.1* 77.2*  PLT 179 [id number]   Basic Metabolic Panel: Recent Labs  Lab 07/01/22 1416 07/01/22 1900 07/02/22 0441  NA 140  --  141  K 3.8  --  3.4*  CL 107  --  109  CO2 24  --  25  GLUCOSE 136*  --  99  BUN 20  --  18  CREATININE 0.90  --  0.80  CALCIUM 8.7*  --  9.2  MG  --  2.2  --    Liver Function Tests: Recent Labs  Lab 07/01/22 1415  AST 31  ALT 28  ALKPHOS 68  BILITOT 0.7  PROT 6.4*  ALBUMIN 3.5   CBG: Recent Labs  Lab 07/01/22 1848 07/02/22 0436 07/02/22 0904 07/02/22 1202  GLUCAP 183* 108* 123* 94    Discharge time spent: greater than 30 minutes.  This record has been created using Systems analyst. Errors have been sought and corrected,but may not always be located. Such creation errors do not reflect on the standard of care.   Signed: Lorella Nimrod, MD [facility] 07/02/2022

## 2022-07-02 NOTE — Assessment & Plan Note (Signed)
-   Pain management will be provided. - She will have a back brace. - We will obtain a neurosurgery consult. - I notified Dr. Johnney Killian about the patient.

## 2022-07-02 NOTE — ED Notes (Signed)
Patient rounded on and call her daughter per request for an update. No answer at this time. Patient notified.

## 2022-07-02 NOTE — Progress Notes (Signed)
*  PRELIMINARY RESULTS* Echocardiogram 2D Echocardiogram has been performed.  Helen Mccormick 07/02/2022, 10:07 AM

## 2022-07-02 NOTE — Evaluation (Signed)
Physical Therapy Evaluation Patient Details Name: Helen Mccormick MRN: [id number] DOB: [birth year] Today's Date: 07/02/2022  History of Present Illness  Pt is a 82 y.o. female with medical history significant for hypertension, dyslipidemia, hypothyroidism, OSA, hypothyroidism, CHF and anxiety, who presented to the emergency room with acute onset of syncope.   Clinical Impression  Patient alert, agreeable to PT eager to mobilize to commode. She was modI for bed mobility and transfers, supervision for ambulation. She was able to perform toileting activities modI as well. DGI score of 19 indicates pt is a potential fall risk, and pt also endorses recent fall as well as feeling concerned about her balance. Agreeable to follow up PT services to maximize safety, function, and balance.        Recommendations for follow up therapy are one component of a multi-disciplinary discharge planning process, led by the attending physician.  Recommendations may be updated based on patient status, additional functional criteria and insurance authorization.  Follow Up Recommendations       Assistance Recommended at Discharge None  Patient can return home with the following  Other (comment) (NA)    Equipment Recommendations None recommended by PT  Recommendations for Other Services       Functional Status Assessment Patient has had a recent decline in their functional status and demonstrates the ability to make significant improvements in function in a reasonable and predictable amount of time. (recent fall, pt concerned about her balance)     Precautions / Restrictions Precautions Precautions: Fall Restrictions Weight Bearing Restrictions: No      Mobility  Bed Mobility Overal bed mobility: Modified Independent                  Transfers Overall transfer level: Modified independent Equipment used: None                    Ambulation/Gait Ambulation/Gait assistance:  Supervision Gait Distance (Feet): 200 Feet Assistive device: None            Stairs            Wheelchair Mobility    Modified Rankin (Stroke Patients Only)       Balance   Sitting-balance support: Feet supported Sitting balance-Leahy Scale: Normal Sitting balance - Comments: able to perform pericare in sitting independently     Standing balance-Leahy Scale: Good                   Standardized Balance Assessment Standardized Balance Assessment : Dynamic Gait Index   Dynamic Gait Index Level Surface: Normal Change in Gait Speed: Normal Gait with Horizontal Head Turns: Mild Impairment Gait with Vertical Head Turns: Mild Impairment Gait and Pivot Turn: Mild Impairment Step Over Obstacle: Mild Impairment Step Around Obstacles: Normal Steps: Mild Impairment ((clinical judgement)) Total Score: 19       Pertinent Vitals/Pain Pain Assessment Pain Assessment: No/denies pain    Home Living Family/patient expects to be discharged to:: Private residence Living Arrangements: Alone Available Help at Discharge: Family Type of Home: House           Home Equipment: None      Prior Function Prior Level of Function : Independent/Modified Independent                     Hand Dominance        Extremity/Trunk Assessment   Upper Extremity Assessment Upper Extremity Assessment: Overall WFL for tasks assessed    Lower Extremity  Assessment Lower Extremity Assessment: Overall WFL for tasks assessed    Cervical / Trunk Assessment Cervical / Trunk Assessment: Normal  Communication      Cognition Arousal/Alertness: Awake/alert Behavior During Therapy: WFL for tasks assessed/performed Overall Cognitive Status: Within Functional Limits for tasks assessed                                          General Comments      Exercises     Assessment/Plan    PT Assessment Patient needs continued PT services  PT Problem List  Decreased mobility;Decreased activity tolerance;Decreased balance       PT Treatment Interventions Therapeutic activities;DME instruction;Gait training;Therapeutic exercise;Stair training;Balance training;Functional mobility training;Neuromuscular re-education    PT Goals (Current goals can be found in the Care Plan section)  Acute Rehab PT Goals Patient Stated Goal: to work on her balance PT Goal Formulation: With patient Time For Goal Achievement: 07/16/22 Potential to Achieve Goals: Good    Frequency Min 2X/week     Co-evaluation               AM-PAC PT "6 Clicks" Mobility  Outcome Measure Help needed turning from your back to your side while in a flat bed without using bedrails?: None Help needed moving from lying on your back to sitting on the side of a flat bed without using bedrails?: None Help needed moving to and from a bed to a chair (including a wheelchair)?: None Help needed standing up from a chair using your arms (e.g., wheelchair or bedside chair)?: None Help needed to walk in hospital room?: None Help needed climbing 3-5 steps with a railing? : None 6 Click Score: 24    End of Session   Activity Tolerance: Patient tolerated treatment well Patient left: in bed;with call bell/phone within reach   PT Visit Diagnosis: Other abnormalities of gait and mobility (R26.89)    Time: [id number] PT Time Calculation (min) (ACUTE ONLY): 10 min   Charges:   PT Evaluation $PT Eval Low Complexity: 1 Low          Lieutenant Diego PT, DPT 1:49 PM,07/02/22

## 2022-07-02 NOTE — Assessment & Plan Note (Signed)
-   She had a cardiac stress test yesterday. - Follow-up will be obtained by Dr. Humphrey Rolls or covering physician. - We will follow serial troponins. - We will continue aspirin and beta-blocker therapy as well as statin therapy.

## 2022-07-02 NOTE — Assessment & Plan Note (Signed)
-   We will continue your antidepressants

## 2022-07-03 ENCOUNTER — Telehealth: Payer: Self-pay

## 2022-07-03 NOTE — Telephone Encounter (Signed)
The ER called Dr Madaline Brilliant about this patient on 07/01/22 regarding an acute T12 fracture. She has seen Helen Mccormick in 2023 for her neck and back, but this is a new problem.   Please offer her an appointment with Duwayne Heck or Kennyth Arnold in a couple of weeks.   She will need xrays that day.  *Helen Mccormick may prefer lumbar xrays, but we can clarify with her later

## 2022-07-06 NOTE — Telephone Encounter (Signed)
Patient confirmed for 07/16/2022 with Stacy.

## 2022-07-06 NOTE — Consult Note (Signed)
CARDIOLOGY CONSULT NOTE               Patient ID: Helen Mccormick MRN: [id number] DOB/AGE: Nov 19, 1940 82 y.o.  Admit date: 07/01/2022 Referring Physician Dr. Pilar Grammes hospitalist Primary Physician Dr. Yves Dill primary Primary Cardiologist Dr. Adrian Blackwater cardiologist Reason for Consultation syncope  HPI: Patient is a 82 year old female presents with episode of syncope she has a history of hypertension hyperlipidemia thyroid disease obstructive sleep apnea congestive heart failure with diastolic dysfunction anxiety reportedly had been having chest pain because of anginal symptoms underwent functional study in the office appeared to be chemically induced patient states she felt poorly after the procedure eventually last and while out in a store got lightheaded weak and had what appeared to be a syncopal episode patient was brought to the emergency room blood pressure was stable pain was somewhat improved EKG had left bundle left bundle branch block no injuries after the syncopal spell imaging was unremarkable cardiology was recommended for consultation for further evaluation.  When the patient was interviewed she was pain-free at the time she just said she felt sick after the procedure.  She had some chest pain during the procedure during cardiologist's office radiating to her left arm and 10 and a 10 chest pain but it is all resolved but she does still feel sick but subsequently ended up having a passing out spell outside doctors office and store  Review of systems complete and found to be negative unless listed above     Past Medical History:  Diagnosis Date   Allergy    Anxiety    CHF (congestive heart failure) (HCC)    Colon polyp    Depression    GERD (gastroesophageal reflux disease)    Heart murmur    Hyperlipidemia    Hypertension    Hypothyroidism    Sleep apnea    Thyroid disease     Past Surgical History:  Procedure Laterality Date   ABDOMINAL HYSTERECTOMY      BACK SURGERY     BACK SURGERY     CERVICAL FUSION     COLONOSCOPY WITH PROPOFOL N/A 05/28/2017   Procedure: COLONOSCOPY WITH PROPOFOL;  Surgeon: Wyline Mood, MD;  Location: [facility] ENDOSCOPY;  Service: Gastroenterology;  Laterality: N/A;   COLONOSCOPY WITH PROPOFOL N/A 11/17/2019   Procedure: COLONOSCOPY WITH PROPOFOL;  Surgeon: Regis Bill, MD;  Location: [facility] ENDOSCOPY;  Service: Endoscopy;  Laterality: N/A;   ESOPHAGOGASTRODUODENOSCOPY (EGD) WITH PROPOFOL N/A 11/17/2019   Procedure: ESOPHAGOGASTRODUODENOSCOPY (EGD) WITH PROPOFOL;  Surgeon: Regis Bill, MD;  Location: [facility] ENDOSCOPY;  Service: Endoscopy;  Laterality: N/A;   NECK SURGERY     THYROID SURGERY     THYROID SURGERY      (Not in a hospital admission)  Social History   Socioeconomic History   Marital status: Single    Spouse name: Not on file   Number of children: Not on file   Years of education: Not on file   Highest education level: 10th grade  Occupational History   Occupation: retired  Tobacco Use   Smoking status: Never   Smokeless tobacco: Former    Types: Associate Professor Use: Never used  Substance and Sexual Activity   Alcohol use: No   Drug use: No   Sexual activity: Not Currently  Other Topics Concern   Not on file  Social History Narrative   Not on file   Social Determinants of Health   Financial  Resource Strain: Low Risk  (04/27/2017)   Overall Financial Resource Strain (CARDIA)    Difficulty of Paying Living Expenses: Not hard at all  Food Insecurity: No Food Insecurity (04/27/2017)   Hunger Vital Sign    Worried About Running Out of Food in the Last Year: Never true    Ran Out of Food in the Last Year: Never true  Transportation Needs: No Transportation Needs (04/27/2017)   PRAPARE - Administrator, Civil Service (Medical): No    Lack of Transportation (Non-Medical): No  Physical Activity: Sufficiently Active (12/20/2018)   Exercise Vital Sign    Days of  Exercise per Week: 4 days    Minutes of Exercise per Session: 60 min  Stress: No Stress Concern Present (03/12/2017)   [employer] of Occupational Health - Occupational Stress Questionnaire    Feeling of Stress : Only a little  Social Connections: Moderately Integrated (04/27/2017)   Social Connection and Isolation Panel [NHANES]    Frequency of Communication with Friends and Family: More than three times a week    Frequency of Social Gatherings with Friends and Family: Once a week    Attends Religious Services: More than 4 times per year    Active Member of [employer] or Organizations: Yes    Attends Banker Meetings: More than 4 times per year    Marital Status: Never married  Intimate Partner Violence: Unknown (03/12/2017)   Humiliation, Afraid, Rape, and Kick questionnaire    Fear of Current or Ex-Partner: Patient declined    Emotionally Abused: Patient declined    Physically Abused: Patient declined    Sexually Abused: Patient declined    Family History  Problem Relation Age of Onset   Breast cancer Maternal Aunt 50   Diabetes Mother    Leukemia Mother    Heart Problems Mother    Diabetes Sister    Mental illness Sister    Colon polyps Sister    Stroke Father    Cancer Brother        rectal   Healthy Daughter    Hypertension Son    Diabetes Sister    Thyroid disease Sister    Healthy Sister    Healthy Sister    Stroke Brother    Healthy Son    Healthy Son    Healthy Daughter       Review of systems complete and found to be negative unless listed above      PHYSICAL EXAM  General: Well developed, well nourished, in no acute distress HEENT:  Normocephalic and atramatic Neck:  No JVD.  Lungs: Clear bilaterally to auscultation and percussion. Heart: HRRR . Normal S1 and S2 without gallops or murmurs.  Abdomen: Bowel sounds are positive, abdomen soft and non-tender  Msk:  Back normal, normal gait. Normal strength and tone for age. Extremities:  No clubbing, cyanosis or edema.   Neuro: Alert and oriented X 3. Psych:  Good affect, responds appropriately  Labs:   Lab Results  Component Value Date   WBC 6.5 07/02/2022   HGB 12.2 07/02/2022   HCT 38.3 07/02/2022   MCV 77.2 (L) 07/02/2022   PLT 183 07/02/2022    Recent Labs  Lab 07/01/22 1415 07/01/22 1416 07/02/22 0441  NA  --    < > 141  K  --    < > 3.4*  CL  --    < > 109  CO2  --    < > 25  BUN  --    < > 18  CREATININE  --    < > 0.80  CALCIUM  --    < > 9.2  PROT 6.4*  --   --   BILITOT 0.7  --   --   ALKPHOS 68  --   --   ALT 28  --   --   AST 31  --   --   GLUCOSE  --    < > 99   < > = values in this interval not displayed.   Lab Results  Component Value Date   CKTOTAL 128 07/17/2012   CKMB 1.8 07/17/2012   TROPONINI <0.03 11/13/2017    Lab Results  Component Value Date   CHOL 121 06/01/2022   CHOL 121 11/08/2018   CHOL 234 (H) 06/24/2018   Lab Results  Component Value Date   HDL 44 06/01/2022   HDL 43 (L) 11/08/2018   HDL 39 (L) 06/24/2018   Lab Results  Component Value Date   LDLCALC 59 06/01/2022   LDLCALC 60 11/08/2018   LDLCALC 157 (H) 06/24/2018   Lab Results  Component Value Date   TRIG 91 06/01/2022   TRIG 97 11/08/2018   TRIG 222 (H) 06/24/2018   Lab Results  Component Value Date   CHOLHDL 2.8 11/08/2018   CHOLHDL 6.0 (H) 06/24/2018   CHOLHDL 4.0 02/15/2018   No results found for: "LDLDIRECT"    Radiology: ECHOCARDIOGRAM COMPLETE  Result Date: 07/02/2022    ECHOCARDIOGRAM REPORT   Patient Name:   ALDEEN RIGA Date of Exam: 07/02/2022 Medical Rec #:  [id number]         Height:       66.0 in Accession #:    [id number]        Weight:       162.0 lb Date of Birth:  09/24/1940          BSA:          1.828 m Patient Age:    81 years          BP:           176/85 mmHg Patient Gender: F                 HR:           76 bpm. Exam Location:  [facility] Procedure: 2D Echo, Cardiac Doppler and Color Doppler Indications:     Syncope   History:         Patient has no prior history of Echocardiogram examinations.                  Cardiomyopathy, Signs/Symptoms:Syncope, Chest Pain and                  Dizziness/Lightheadedness; Risk Factors:Hypertension, Diabetes,                  Sleep Apnea and Dyslipidemia.  Sonographer:     Mikki Harbor Referring Phys:  [id number] JAN A MANSY Diagnosing Phys: Lorine Bears MD  Sonographer Comments: Image acquisition challenging due to respiratory motion. IMPRESSIONS  1. Left ventricular ejection fraction, by estimation, is 55 to 60%. The left ventricle has normal function. Left ventricular endocardial border not optimally defined to evaluate regional wall motion. There is mild left ventricular hypertrophy. Left ventricular diastolic parameters are consistent with Grade I diastolic dysfunction (impaired relaxation).  2. Right ventricular systolic function is normal. The right ventricular size is  normal.  3. The mitral valve is normal in structure. Mild mitral valve regurgitation. No evidence of mitral stenosis.  4. The aortic valve is normal in structure. Aortic valve regurgitation is not visualized. Aortic valve sclerosis is present, with no evidence of aortic valve stenosis.  5. The inferior vena cava is normal in size with greater than 50% respiratory variability, suggesting right atrial pressure of 3 mmHg. FINDINGS  Left Ventricle: Left ventricular ejection fraction, by estimation, is 55 to 60%. The left ventricle has normal function. Left ventricular endocardial border not optimally defined to evaluate regional wall motion. The left ventricular internal cavity size was normal in size. There is mild left ventricular hypertrophy. Left ventricular diastolic parameters are consistent with Grade I diastolic dysfunction (impaired relaxation). Right Ventricle: The right ventricular size is normal. No increase in right ventricular wall thickness. Right ventricular systolic function is normal. Left Atrium: Left  atrial size was normal in size. Right Atrium: Right atrial size was normal in size. Pericardium: There is no evidence of pericardial effusion. Mitral Valve: The mitral valve is normal in structure. There is moderate thickening of the mitral valve leaflet(s). Mild mitral valve regurgitation. No evidence of mitral valve stenosis. MV peak gradient, 8.5 mmHg. The mean mitral valve gradient is 3.0 mmHg. Tricuspid Valve: The tricuspid valve is normal in structure. Tricuspid valve regurgitation is not demonstrated. No evidence of tricuspid stenosis. Aortic Valve: The aortic valve is normal in structure. Aortic valve regurgitation is not visualized. Aortic valve sclerosis is present, with no evidence of aortic valve stenosis. Aortic valve mean gradient measures 3.0 mmHg. Aortic valve peak gradient measures 5.8 mmHg. Aortic valve area, by VTI measures 2.95 cm. Pulmonic Valve: The pulmonic valve was normal in structure. Pulmonic valve regurgitation is not visualized. No evidence of pulmonic stenosis. Aorta: The aortic root is normal in size and structure. Venous: The inferior vena cava is normal in size with greater than 50% respiratory variability, suggesting right atrial pressure of 3 mmHg. IAS/Shunts: No atrial level shunt detected by color flow Doppler.  LEFT VENTRICLE PLAX 2D LVIDd:         4.30 cm   Diastology LVIDs:         3.30 cm   LV e' medial:    6.74 cm/s LV PW:         1.30 cm   LV E/e' medial:  10.4 LV IVS:        1.30 cm   LV e' lateral:   5.87 cm/s LVOT diam:     1.90 cm   LV E/e' lateral: 12.0 LV SV:         67 LV SV Index:   37 LVOT Area:     2.84 cm  RIGHT VENTRICLE RV Basal diam:  3.25 cm RV Mid diam:    2.50 cm RV S prime:     16.00 cm/s TAPSE (M-mode): 2.6 cm LEFT ATRIUM             Index        RIGHT ATRIUM           Index LA diam:        3.50 cm 1.91 cm/m   RA Area:     14.30 cm LA Vol (A2C):   55.0 ml 30.08 ml/m  RA Volume:   36.30 ml  19.85 ml/m LA Vol (A4C):   51.5 ml 28.17 ml/m LA Biplane  Vol: 56.3 ml 30.79 ml/m  AORTIC VALVE  PULMONIC VALVE AV Area (Vmax):    2.48 cm     PV Vmax:       1.22 m/s AV Area (Vmean):   2.86 cm     PV Peak grad:  6.0 mmHg AV Area (VTI):     2.95 cm AV Vmax:           120.00 cm/s AV Vmean:          70.400 cm/s AV VTI:            0.227 m AV Peak Grad:      5.8 mmHg AV Mean Grad:      3.0 mmHg LVOT Vmax:         105.00 cm/s LVOT Vmean:        71.000 cm/s LVOT VTI:          0.236 m LVOT/AV VTI ratio: 1.04  AORTA Ao Root diam: 3.80 cm MITRAL VALVE MV Area (PHT): 2.99 cm     SHUNTS MV Area VTI:   1.86 cm     Systemic VTI:  0.24 m MV Peak grad:  8.5 mmHg     Systemic Diam: 1.90 cm MV Mean grad:  3.0 mmHg MV Vmax:       1.46 m/s MV Vmean:      70.7 cm/s MV Decel Time: 254 msec MV E velocity: 70.40 cm/s MV A velocity: 141.00 cm/s MV E/A ratio:  0.50 Lorine Bears MD Electronically signed by Lorine Bears MD Signature Date/Time: 07/02/2022/11:35:42 AM    Final    US Carotid Bilateral  Result Date: 07/02/2022 CLINICAL DATA:  Syncope EXAM: BILATERAL CAROTID DUPLEX ULTRASOUND TECHNIQUE: Wallace Cullens scale imaging, color Doppler and duplex ultrasound were performed of bilateral carotid and vertebral arteries in the neck. COMPARISON:  None Available. FINDINGS: Criteria: Quantification of carotid stenosis is based on velocity parameters that correlate the residual internal carotid diameter with NASCET-based stenosis levels, using the diameter of the distal internal carotid lumen as the denominator for stenosis measurement. The following velocity measurements were obtained: RIGHT ICA: 48/8 cm/sec CCA: 57/10 cm/sec SYSTOLIC ICA/CCA RATIO:  0.9 ECA:  68 cm/sec LEFT ICA: 53/14 cm/sec CCA: 63/9 cm/sec SYSTOLIC ICA/CCA RATIO:  1.0 ECA:  73 cm/sec RIGHT CAROTID ARTERY: Trace smooth atherosclerotic plaque in the proximal internal carotid artery. No significant stenosis. RIGHT VERTEBRAL ARTERY:  Patent with normal antegrade flow. LEFT CAROTID ARTERY: No atherosclerotic plaque or  evidence of stenosis. LEFT VERTEBRAL ARTERY:  Patent with normal antegrade flow. IMPRESSION: Trace smooth heterogeneous atherosclerotic plaque in the proximal right internal carotid artery without evidence of associated stenosis. No evidence of plaque or stenosis in the left internal carotid artery. Vertebral arteries are patent with normal antegrade flow. Electronically Signed   By: Malachy Moan M.D.   On: 07/02/2022 05:57   CT L-SPINE NO CHARGE  Result Date: 07/01/2022 CLINICAL DATA:  Fall, right back pain EXAM: CT LUMBAR SPINE WITHOUT CONTRAST TECHNIQUE: Multidetector CT imaging of the lumbar spine was performed without intravenous contrast administration. Multiplanar CT image reconstructions were also generated. RADIATION DOSE REDUCTION: This exam was performed according to the departmental dose-optimization program which includes automated exposure control, adjustment of the mA and/or kV according to patient size and/or use of iterative reconstruction technique. COMPARISON:  CT abdomen pelvis 04/24/2022 FINDINGS: Segmentation: 5 lumbar type vertebrae. Alignment: 3-4 mm anterolisthesis L3-4 and L4-5 and 3 mm retrolisthesis L5-S1 noted, likely degenerative in nature. Otherwise normal lumbar lordosis. Vertebrae: There is an acute central superior endplate fracture of T12  which is not involve the posterior wall and results in mild (approximately 10-15%) loss of height anteriorly. No retropulsion or listhesis. The posterior elements appear intact. No other fracture identified. No suspicious lytic or blastic bone lesion. Paraspinal and other soft tissues: No paraspinal fluid collection or inflammatory change. Moderate aortoiliac mixed atherosclerotic plaque. Disc levels: Mild intervertebral disc space narrowing and endplate remodeling at L4-5 and vacuum disc phenomena at L5-S1 is present in keeping with changes of mild to moderate degenerative disc disease. Remaining intervertebral disc heights are preserved.  Axial images demonstrate: T11-T12: Moderate facet arthrosis. Resultant severe right neuroforaminal narrowing. No canal stenosis. T12-L1: No neuroforaminal narrowing. No canal stenosis. Mild bilateral facet arthrosis. L1-2: Mild bilateral facet arthrosis. No neuroforaminal narrowing. No canal stenosis. L2-3: Mild facet arthrosis. No neuroforaminal narrowing. No canal stenosis. L3-4: Mild broad-based disc bulge. Moderate bilateral facet arthrosis. No neuroforaminal narrowing. Minimal canal stenosis. L4-5: Moderate broad-based disc bulge. Marked bilateral facet arthrosis. Resultant mild bilateral neuroforaminal narrowing and moderate central canal stenosis with possible impingement of the lateral recesses bilaterally and crossing L5 nerve roots. L5-S1 mild bilateral facet arthrosis. No neuroforaminal narrowing. No canal stenosis. IMPRESSION: 1. Acute central superior endplate fracture of T12 resulting in mild (approximately 10-15%) loss of height anteriorly. No retropulsion or listhesis. 2. Multilevel degenerative disc and degenerative joint disease resulting in multilevel neuroforaminal narrowing as described above. 3. 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 This would be better assessed with MRI examination if indicated. 4. Aortic atherosclerosis. Aortic Atherosclerosis (ICD10-I70.0). Electronically Signed   By: Helyn NumbersAshesh  Parikh M.D.   On: 07/01/2022 20:18   CT ABDOMEN PELVIS W CONTRAST  Result Date: 07/01/2022 CLINICAL DATA:  Abdominal pain. EXAM: CT ABDOMEN AND PELVIS WITH CONTRAST TECHNIQUE: Multidetector CT imaging of the abdomen and pelvis was performed using the standard protocol following bolus administration of intravenous contrast. RADIATION DOSE REDUCTION: This exam was performed according to the departmental dose-optimization program which includes automated exposure control, adjustment of the mA and/or kV according  to patient size and/or use of iterative reconstruction technique. CONTRAST:  100mL OMNIPAQUE IOHEXOL 300 MG/ML  SOLN COMPARISON:  CT 04/24/2022 and older FINDINGS: Lower chest: There is some linear opacity lung bases likely scar or atelectasis. Small air cysts at the left lung base laterally on series 4, image 25, unchanged from previous. Hepatobiliary: Eventration of the right hemidiaphragm. There is focal fat deposition seen in segment 4 of the liver adjacent to the falciform ligament. No other space-occupying liver lesion. Patent portal vein. Pancreas: Moderate atrophy of the pancreas. Spleen: The spleen is nonenlarged.  Small splenule. Adrenals/Urinary Tract: Left adrenal gland is preserved. There is a right adrenal gland nodule measuring 11 mm on series 2, image 20. Hounsfield unit of 103 on portal venous phase and on delay 57. This has a relative washout of 44% and is highly suggestive of an adenoma. This was present but smaller in 2016 on a CT scan. No enhancing renal mass or collecting system dilatation. The ureters have normal course and caliber down to the bladder. Preserved contours of the urinary bladder. Stomach/Bowel: Scattered colonic stool. Few colonic diverticula. No bowel obstruction, free air or free fluid. Normal appendix. The stomach and small bowel are nondilated. Vascular/Lymphatic: Scattered atherosclerotic calcified plaque along the aorta and branch vessels. There is a focal stenosis along the middle abdominal aorta diameter approaching 50%. Patent lumen narrows to short axis of 8 mm. Normal caliber IVC. There  are areas of stenosis along the renal arteries. Please correlate for level of hypertension. No specific abnormal lymph node enlargement identified in the abdomen and pelvis. Reproductive: Status post hysterectomy. No adnexal masses. Other: No abdominal wall hernia or abnormality. No abdominopelvic ascites. Musculoskeletal: Scattered degenerative changes along the spine pelvis. Grade 1  anterolisthesis of L4 on L5. There is compression of the superior endplate of T12. This was not seen on the prior examination. This has some acute margins. An acute compression is possible. Please correlate with clinical history. IMPRESSION: No bowel obstruction, free air or free fluid. Normal appendix. Scattered stool and diverticula. Diffuse atherosclerotic changes with mild focal aortic stenosis. 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 This is slightly larger but was seen in 2016. Acute appearing mild compression of the superior endplate of T12. Please correlate for any known history of injury. Please see separate dictation of lumbar spine CT. Electronically Signed   By: Karen Kays M.D.   On: 07/01/2022 20:16   CT Cervical Spine Wo Contrast  Result Date: 07/01/2022 CLINICAL DATA:  Larey Seat after stress test this morning, neck trauma EXAM: CT CERVICAL SPINE WITHOUT CONTRAST TECHNIQUE: Multidetector CT imaging of the cervical spine was performed without intravenous contrast. Multiplanar CT image reconstructions were also generated. RADIATION DOSE REDUCTION: This exam was performed according to the departmental dose-optimization program which includes automated exposure control, adjustment of the mA and/or kV according to patient size and/or use of iterative reconstruction technique. COMPARISON:  02/21/2021, 09/08/2021 FINDINGS: Alignment: Stable anatomic alignment. Skull base and vertebrae: No acute fracture. No primary bone lesion or focal pathologic process. Soft tissues and spinal canal: No prevertebral fluid or swelling. No visible canal hematoma. Disc levels: Stable C3-4 ACDF. Stable lower cervical spondylosis and facet hypertrophy greatest at the C4-5 level. Upper chest: Airway is patent.  Lung apices are clear. Other: Reconstructed images demonstrate no additional findings. IMPRESSION: 1. No acute cervical spine fracture. 2. Stable C3-4 ACDF. Stable lower cervical spondylosis and facet  hypertrophy. Electronically Signed   By: Sharlet Salina M.D.   On: 07/01/2022 20:09   CT HEAD WO CONTRAST ( )  Result Date: 07/01/2022 CLINICAL DATA:  Headache, nausea, and weakness after stress test this morning, fell, hit head EXAM: CT HEAD WITHOUT CONTRAST TECHNIQUE: Contiguous axial images were obtained from the base of the skull through the vertex without intravenous contrast. RADIATION DOSE REDUCTION: This exam was performed according to the departmental dose-optimization program which includes automated exposure control, adjustment of the mA and/or kV according to patient size and/or use of iterative reconstruction technique. COMPARISON:  11/16/2015 FINDINGS: Brain: Confluent hypodensities throughout the periventricular and subcortical white matter consistent with chronic small vessel ischemic change. No evidence of acute infarct or hemorrhage. The lateral ventricles and midline structures are unremarkable. No acute extra-axial fluid collections. No mass effect. Vascular: No hyperdense vessel or unexpected calcification. Skull: Normal. Negative for fracture or focal lesion. Sinuses/Orbits: No acute finding. Other: None. IMPRESSION: 1. No acute intracranial process. Electronically Signed   By: Sharlet Salina M.D.   On: 07/01/2022 20:06    EKG: Normal sinus rhythm left bundle branch block rate of 65  ASSESSMENT AND PLAN:  Syncope Atypical chest pain Diabetes Hyperlipidemia Hypertension Depression. . Plan Etiology for syncope unclear potentially vagal after chemical induced stress test symptoms appear to have resolved Agree with observation follow-up EKGs troponin Maintain aspirin statin beta-blocker IV heparin until negative troponins Symptoms appear reasonably atypical would recommend limited cardiac workup Rule out orthostatic changes maintain  adequate hydration Continue diabetes management and control currently on Farxiga and NovoLog Agree with statin therapy for hyperlipidemia with  Crestor Maintain adequate blood pressure management and control currently on beta-blocker Imdur HCTZ Entresto amlodipine Continue Protonix therapy for reflux type symptoms Will have the patient follow-up with cardiology as an outpatient  Signed: Alwyn Pea MD 07/06/2022, 8:10 AM

## 2022-07-08 ENCOUNTER — Emergency Department
Admission: EM | Admit: 2022-07-08 | Discharge: 2022-07-08 | Disposition: A | Discharge status: Home / Self Care | Payer: 59 | Attending: Emergency Medicine | Admitting: Emergency Medicine

## 2022-07-08 ENCOUNTER — Other Ambulatory Visit: Payer: Self-pay

## 2022-07-08 ENCOUNTER — Encounter: Payer: Self-pay | Admitting: Intensive Care

## 2022-07-08 DIAGNOSIS — S22088A Other fracture of T11-T12 vertebra, initial encounter for closed fracture: Secondary | ICD-10-CM | POA: Insufficient documentation

## 2022-07-08 DIAGNOSIS — S22089A Unspecified fracture of T11-T12 vertebra, initial encounter for closed fracture: Secondary | ICD-10-CM

## 2022-07-08 DIAGNOSIS — M549 Dorsalgia, unspecified: Secondary | ICD-10-CM | POA: Diagnosis present

## 2022-07-08 DIAGNOSIS — I10 Essential (primary) hypertension: Secondary | ICD-10-CM | POA: Diagnosis not present

## 2022-07-08 DIAGNOSIS — X58XXXA Exposure to other specified factors, initial encounter: Secondary | ICD-10-CM | POA: Insufficient documentation

## 2022-07-08 LAB — BASIC METABOLIC PANEL
Anion gap: 10 (ref 5–15)
BUN: 20 mg/dL (ref 8–23)
CO2: 26 mmol/L (ref 22–32)
Calcium: 9.1 mg/dL (ref 8.9–10.3)
Chloride: 106 mmol/L (ref 98–111)
Creatinine, Ser: 0.84 mg/dL (ref 0.44–1.00)
GFR, Estimated: >60 mL/min (ref >60)
Glucose, Bld: 157 mg/dL — ABNORMAL HIGH (ref 70–99)
Potassium: 3.7 mmol/L (ref 3.5–5.1)
Sodium: 142 mmol/L (ref 135–145)

## 2022-07-08 LAB — CBC
HCT: 39.5 % (ref 36.0–46.0)
Hemoglobin: 12.4 g/dL (ref 12.0–15.0)
MCH: 24.3 pg — ABNORMAL LOW (ref 26.0–34.0)
MCHC: 31.4 g/dL (ref 30.0–36.0)
MCV: 77.5 fL — ABNORMAL LOW (ref 80.0–100.0)
Platelets: 184 10*3/uL (ref 150–400)
RBC: 5.1 MIL/uL (ref 3.87–5.11)
RDW: 14.9 % (ref 11.5–15.5)
WBC: 6.6 10*3/uL (ref 4.0–10.5)
nRBC: 0 % (ref 0.0–0.2)

## 2022-07-08 MED ORDER — KETOROLAC TROMETHAMINE 30 MG/ML IJ SOLN
30.0000 mg | Freq: Once | INTRAMUSCULAR | Status: AC
  Administered 2022-07-08: 30 mg via INTRAMUSCULAR
  Filled 2022-07-08: qty 1

## 2022-07-08 MED ORDER — OXYCODONE HCL 5 MG PO TABS: 5.0000 mg | ORAL_TABLET | Freq: Four times a day (QID) | ORAL | 0 refills | Status: AC | PRN

## 2022-07-08 NOTE — ED Triage Notes (Signed)
Patient presents with right sided flank pain. Denies urinary symptoms. Denies N/V/D

## 2022-07-08 NOTE — ED Provider Notes (Signed)
[facility] Provider Note    Event Date/Time   First MD Initiated Contact with Patient 07/08/22 1515     (approximate)   History   Flank Pain   HPI  Helen Mccormick is a 82 y.o. female past medical history significant for hypertension, hyperlipidemia, recent T12 fracture, who presents to the emergency department with back and flank pain.  Patient states that she has had ongoing pain since she had a fracture to her back.  Every time she goes to stand up states that she gets a severe pain to her back that radiates around to her right side and catches her and feels like she cannot move.  States that she took her TLSO brace off earlier in order to take a bath and is not currently wearing it.  States that she took a pain pill this morning.  Denies any lower extremity weakness or numbness.  No urinary or bowel incontinence.  Denies fever or chills.  No constipation.  No nausea, vomiting or diarrhea.     Physical Exam   Triage Vital Signs: ED Triage Vitals  Enc Vitals Group     BP 07/08/22 1511 (!) 147/70     Pulse Rate 07/08/22 1511 68     Resp 07/08/22 1511 18     Temp 07/08/22 1509 97.9 F (36.6 C)     Temp Source 07/08/22 1509 Oral     SpO2 07/08/22 1511 96 %     Weight 07/08/22 1509 172 lb (78 kg)     Height 07/08/22 1509 5\' 4"  (1.626 m)     Head Circumference --      Peak Flow --      Pain Score 07/08/22 1509 10     Pain Loc --      Pain Edu? --      Excl. in GC? --     Most recent vital signs: Vitals:   07/08/22 1509 07/08/22 1511  BP:  (!) 147/70  Pulse:  68  Resp:  18  Temp: 97.9 F (36.6 C)   SpO2:  96%    Physical Exam Constitutional:      Appearance: She is well-developed.  HENT:     Head: Atraumatic.  Eyes:     Conjunctiva/sclera: Conjunctivae normal.  Cardiovascular:     Rate and Rhythm: Regular rhythm.  Pulmonary:     Effort: No respiratory distress.  Abdominal:     General: There is no distension.     Tenderness:  There is no abdominal tenderness. There is no right CVA tenderness or left CVA tenderness.  Musculoskeletal:        General: Normal range of motion.     Cervical back: Normal range of motion.     Comments: Point tenderness to the lower thoracic spine that is midline.  No step-offs or deformities.  Skin:    General: Skin is warm.  Neurological:     Mental Status: She is alert. Mental status is at baseline.     Comments: No lower extremity weakness.  Ambulatory in the emergency department.  No saddle anesthesia.      IMPRESSION / MDM / ASSESSMENT AND PLAN / ED COURSE  I reviewed the triage vital signs and the nursing notes.  On chart review patient had a recent admission following an episode of syncope complicated by a T12 superior endplate fracture.  Patient was evaluated by neurosurgery and recommended a TLSO brace and outpatient follow-up.  Patient has a scheduled  appointment as an outpatient with neurosurgery.  Differential diagnosis including musculoskeletal strain, ongoing pain from known T12 fracture.  Clinical picture is not consistent with constipation.  Abdomen is nontender to palpation have a low suspicion for acute appendicitis.  No nausea or vomiting, no urinary symptoms, low suspicion for UTI, pyelonephritis or kidney stones.  No signs of a cauda equina or epidural compression syndrome.  No new falls or trauma do not feel that repeat x-ray imaging is necessary at this time.  Do not feel that the patient meets criteria for MRI.  Labs (all labs ordered are listed, but only abnormal results are displayed) Labs interpreted as -    Labs Reviewed  BASIC METABOLIC PANEL - Abnormal; Notable for the following components:      Result Value   Glucose, Bld 157 (*)    All other components within normal limits  CBC - Abnormal; Notable for the following components:   MCV 77.5 (*)    MCH 24.3 (*)    All other components within normal limits      Given IM ketorolac.  Lab work  reassuring.  Discussed pain management with the Mobic that she was prescribed.  Also discussed Tylenol.  Will do a short course of oxycodone given her ongoing pain.  Discussed the risk and benefits of this medication.  Discussed close follow-up as an outpatient with her neurosurgeon.   PROCEDURES:  Critical Care performed: No  Procedures  Patient's presentation is most consistent with acute illness / injury with system symptoms.   MEDICATIONS ORDERED IN ED: Medications  ketorolac (TORADOL) 30 MG/ML injection 30 mg (30 mg Intramuscular Given 07/08/22 1536)    FINAL CLINICAL IMPRESSION(S) / ED DIAGNOSES   Final diagnoses:  Closed fracture of twelfth thoracic vertebra, unspecified fracture morphology, initial encounter     Rx / DC Orders   ED Discharge Orders          Ordered    oxyCODONE (ROXICODONE) 5 MG immediate release tablet  Every 6 hours PRN        07/08/22 1542             Note:  This document was prepared using Dragon voice recognition software and may include unintentional dictation errors.   Corena Herter, MD 07/08/22 [phone]

## 2022-07-08 NOTE — Discharge Instructions (Addendum)
You are seen in the emergency department and concerned that your back pain is from your T12 fracture.  It is important that you wear your TLSO brace as discussed with neurosurgery.   Continue your Mobic for pain control.  You can take 1000 mg of Tylenol (acetaminophen) every 6 hours as needed for pain control.  If your pain continues you were given a short course of oxycodone, you can take one tablet every 6 hours.  You were given a prescription for narcotic pain medications.  Take only if in severe pain.  These are very addictive medications.  These medications can make you constipated.  If you need to take more than 1-2 doses, start a stool softner.  If you become constipated, take 1 capfull of MiraLAX, can repeat untill having regular bowel movements.  Keep this medication out of reach of any children.

## 2022-07-10 ENCOUNTER — Encounter: Payer: Self-pay | Admitting: Cardiovascular Disease

## 2022-07-10 ENCOUNTER — Ambulatory Visit (INDEPENDENT_AMBULATORY_CARE_PROVIDER_SITE_OTHER): Payer: Medicaid Other | Admitting: Cardiovascular Disease

## 2022-07-10 VITALS — BP 128/64 | HR 87 | Ht 64.0 in | Wt 170.0 lb

## 2022-07-10 DIAGNOSIS — I10 Essential (primary) hypertension: Secondary | ICD-10-CM

## 2022-07-10 DIAGNOSIS — E785 Hyperlipidemia, unspecified: Secondary | ICD-10-CM

## 2022-07-10 DIAGNOSIS — I428 Other cardiomyopathies: Secondary | ICD-10-CM | POA: Diagnosis not present

## 2022-07-10 DIAGNOSIS — R55 Syncope and collapse: Secondary | ICD-10-CM | POA: Diagnosis not present

## 2022-07-10 NOTE — Assessment & Plan Note (Signed)
LDL 59 05/2022. Continue rosuvastatin 40 mg daily.

## 2022-07-10 NOTE — Assessment & Plan Note (Signed)
Echo 06/2022 EF 55-60%, grade I DD

## 2022-07-10 NOTE — Assessment & Plan Note (Signed)
Patient had syncopal episode after chemical nuclear stress test. Went to ED, received fluids. No further syncopal episodes. Denies chest pain, shortness of breath.

## 2022-07-10 NOTE — Progress Notes (Signed)
Cardiology Office Note   Date:  07/10/2022   ID:  Helen Mccormick, DOB [birth year], MRN [id number]  PCP:  Helen Loveless, MD  Cardiologist:  Helen Blackwater, MD      History of Present Illness: Helen Mccormick is a 82 y.o. female who presents for  Chief Complaint  Patient presents with   Follow-up    3 month follow up    Patient in office to discuss stress test results, ED follow up. Had syncopal episode after stress test.   Loss of Consciousness This is a new problem. The current episode started 1 to 4 weeks ago. Episode frequency: One episode. The problem has been resolved. She lost consciousness for a period of less than 1 minute. Exacerbated by: after chemical stress test. Associated symptoms include abdominal pain, diaphoresis, dizziness, headaches and weakness. Treatments tried: Went to ED, recieved fluid recusitation. The treatment provided significant relief. Her past medical history is significant for DM and HTN.    Past Medical History:  Diagnosis Date   Allergy    Anxiety    CHF (congestive heart failure)    Colon polyp    Depression    GERD (gastroesophageal reflux disease)    Heart murmur    Hyperlipidemia    Hypertension    Hypothyroidism    Sleep apnea    Thyroid disease      Past Surgical History:  Procedure Laterality Date   ABDOMINAL HYSTERECTOMY     BACK SURGERY     BACK SURGERY     CERVICAL FUSION     COLONOSCOPY WITH PROPOFOL N/A 05/28/2017   Procedure: COLONOSCOPY WITH PROPOFOL;  Surgeon: Wyline Mood, MD;  Location[facility] ENDOSCOPY;  Service: Gastroenterology;  Laterality: N/A;   COLONOSCOPY WITH PROPOFOL N/A 11/17/2019   Procedure: COLONOSCOPY WITH PROPOFOL;  Surgeon: Regis Bill, MD;  Location: [facility] ENDOSCOPY;  Service: Endoscopy;  Laterality: N/A;   ESOPHAGOGASTRODUODENOSCOPY (EGD) WITH PROPOFOL N/A 11/17/2019   Procedure: ESOPHAGOGASTRODUODENOSCOPY (EGD) WITH PROPOFOL;  Surgeon: Regis Bill, MD;  Location: [facility] ENDOSCOPY;   Service: Endoscopy;  Laterality: N/A;   NECK SURGERY     THYROID SURGERY     THYROID SURGERY       Current Outpatient Medications  Medication Sig Dispense Refill   albuterol (PROVENTIL HFA;VENTOLIN HFA) 108 (90 Base) MCG/ACT inhaler Inhale into the lungs every 6 (six) hours as needed for wheezing or shortness of breath.     amLODipine (NORVASC) 5 MG tablet TAKE 1 TABLET(5 MG) BY MOUTH DAILY FOR BLOOD PRESSURE 90 tablet 1   aspirin 81 MG chewable tablet Chew 81 mg by mouth daily.      Azelastine HCl 137 MCG/SPRAY SOLN SMARTSIG:1-2 Spray(s) Both Nares Twice Daily     Blood Glucose Monitoring Suppl (ACCU-CHEK GUIDE) w/Device KIT See admin instructions.     busPIRone (BUSPAR) 15 MG tablet TAKE 1 TABLET BY MOUTH TWICE A DAY 180 tablet 2   carvedilol (COREG) 12.5 MG tablet Take 12.5 mg by mouth 2 (two) times daily.     cyclobenzaprine (FLEXERIL) 5 MG tablet Take 1 tablet (5 mg total) by mouth 3 (three) times daily as needed for muscle spasms. 30 tablet 0   ENTRESTO 24-26 MG Take 1 tablet by mouth 2 (two) times daily. 60 tablet 5   FARXIGA 10 MG TABS tablet Take 10 mg by mouth daily.     FLOVENT HFA 44 MCG/ACT inhaler SMARTSIG:2 Puff(s) By Mouth Twice Daily     glucose blood test  strip Use 1 strip to check your blood sugar two times daily. 50 each 12   levothyroxine (SYNTHROID, LEVOTHROID) 75 MCG tablet TAKE 1 TABLET BY MOUTH DAILY BEFORE BREAKFAST FOR THYROID 90 tablet 1   meloxicam (MOBIC) 15 MG tablet meloxicam 15 mg tablet  Take 1 tablet every day as needed for pain 30 tablet 0   oxyCODONE (ROXICODONE) 5 MG immediate release tablet Take 1 tablet (5 mg total) by mouth every 6 (six) hours as needed for up to 5 days for moderate pain or severe pain. 20 tablet 0   pantoprazole (PROTONIX) 20 MG tablet Take 1 tablet (20 mg total) by mouth daily. FOR ACID REFLUX AND STOMACH PAIN 90 tablet 1   RESTASIS 0.05 % ophthalmic emulsion Place 1 drop into both eyes 2 (two) times daily.     rosuvastatin  (CRESTOR) 40 MG tablet Take 40 mg by mouth daily.     escitalopram (LEXAPRO) 10 MG tablet TAKE 1 TABLET BY MOUTH EVERY DAY FOR DEPRESSION (Patient taking differently: Take 10 mg by mouth daily. TAKE 1 TABLET BY MOUTH EVERY DAY FOR DEPRESSION) 90 tablet 1   isosorbide mononitrate (IMDUR) 30 MG 24 hr tablet TAKE 1 TABLET(30 MG) BY MOUTH DAILY (Patient taking differently: Take 30 mg by mouth daily.) 30 tablet 2   nitroGLYCERIN (NITROSTAT) 0.4 MG SL tablet Place 1 tablet (0.4 mg total) under the tongue every 5 (five) minutes as needed for chest pain. (Patient not taking: Reported on 06/05/2022) 50 tablet 3   polyethylene glycol powder (GLYCOLAX/MIRALAX) 17 GM/SCOOP powder polyethylene glycol 3350 17 gram/dose oral powder  TK  PO ONCE FOR 1 DOSE (Patient not taking: Reported on 06/05/2022)     No current facility-administered medications for this visit.    Allergies:   Amlodipine, Tramadol, Isosorbide dinitrate, Codeine, and Etodolac    Social History:   reports that she has never smoked. She has quit using smokeless tobacco.  Her smokeless tobacco use included chew. She reports that she does not drink alcohol and does not use drugs.   Family History:  family history includes Breast cancer (age of onset: 22) in her maternal aunt; Cancer in her brother; Colon polyps in her sister; Diabetes in her mother, sister, and sister; Healthy in her daughter, daughter, sister, sister, son, and son; Heart Problems in her mother; Hypertension in her son; Leukemia in her mother; Mental illness in her sister; Stroke in her brother and father; Thyroid disease in her sister.    ROS:     Review of Systems  Constitutional:  Positive for diaphoresis.  HENT: Negative.    Eyes: Negative.   Respiratory: Negative.    Cardiovascular:  Positive for syncope.  Gastrointestinal:  Positive for abdominal pain.  Genitourinary: Negative.   Musculoskeletal: Negative.   Skin: Negative.   Neurological:  Positive for dizziness,  weakness and headaches.  Endo/Heme/Allergies: Negative.   Psychiatric/Behavioral: Negative.    All other systems reviewed and are negative.   All other systems are reviewed and negative.   PHYSICAL EXAM: VS:  BP 128/64 (BP Location: Right Arm, Patient Position: Sitting, Cuff Size: Large)   Pulse 87   Ht 5\' 4"  (1.626 m)   Wt 170 lb (77.1 kg)   LMP  (LMP Unknown)   SpO2 95%   BMI 29.18 kg/m  , BMI Body mass index is 29.18 kg/m. Last weight:  Wt Readings from Last 3 Encounters:  07/10/22 170 lb (77.1 kg)  07/08/22 172 lb (78 kg)  07/01/22 162  lb (73.5 kg)   Physical Exam Constitutional:      Appearance: Normal appearance.  Cardiovascular:     Rate and Rhythm: Normal rate and regular rhythm.     Heart sounds: Normal heart sounds.  Pulmonary:     Effort: Pulmonary effort is normal.     Breath sounds: Normal breath sounds.  Musculoskeletal:     Right lower leg: No edema.     Left lower leg: No edema.  Neurological:     Mental Status: She is alert.     EKG: none today  Recent Labs: 07/01/2022: ALT 28; Magnesium 2.2 07/08/2022: BUN 20; Creatinine, Ser 0.84; Hemoglobin 12.4; Platelets 184; Potassium 3.7; Sodium 142    Lipid Panel    Component Value Date/Time   CHOL 121 06/01/2022 1107   TRIG 91 06/01/2022 1107   HDL 44 06/01/2022 1107   CHOLHDL 2.8 11/08/2018 0806   VLDL 23 08/16/2017 0528   LDLCALC 59 06/01/2022 1107   LDLCALC 60 11/08/2018 0806     Other studies Reviewed: Patient: 5477.0 - Baldwin Crown Sharples DOB:  1940-09-25  Date:  11/27/2021 10:00 Provider: Adrian Blackwater MD Encounter: ECHO   Page 2 REASON FOR VISIT  Visit for: Echocardiogram/I50.9  Sex:   Female  wt=171    lbs.  BP=112/64  Height= 64   inches.   TESTS  Imaging: Echocardiogram:  An echocardiogram in (2-d) mode was performed and in Doppler mode with color flow velocity mapping was performed. The aortic valve cusps are abnormal 1.2  cm, flow velocity 1.21   m/s, and systolic  calculated mean flow gradient 3  mmHg. Mitral valve diastolic peak flow velocity E .637    m/s and E/A ratio 0.5. Aortic root diameter 2.6   cm. The LVOT internal diameter 3.0  cm and flow velocity was abnormal 1.06  m/s. LV systolic dimension 2.9  cm, diastolic 4.72  cm, posterior wall thickness 0.796 cm, fractional shortening 38.6 %, and EF 68.7  %. IVS thickness 0.837 cm. LA dimension 3.7 cm. Mitral Valve has Mild Regurgitation. Tricuspid Valve has Trace Regurgitation.   ASSESSMENT  Technically adequate study.  Normal chamber sizes.  Normal left ventricular systolic function.  Mild left ventricular hypertrophy with GRADE 3 (restrictive physiology) diastolic dysfunction.  Normal right ventricular systolic function.  Normal right ventricular diastolic function.  Normal left ventricular wall motion.  Normal right ventricular wall motion.  Trace tricuspid regurgitation.  Normal pulmonary artery pressure.  Mild mitral regurgitation.  No pericardial effusion.  Mild LVH.   THERAPY   Referring physician: Laurier Nancy  Sonographer: Helen Mccormick.  Helen Blackwater MD  Electronically signed by: Helen Mccormick     Date: 11/28/2021 09:22  Patient: 5477.0 - Baldwin Crown Carl DOB:  January 04, 1941  Date:  01/02/2021 07:45 Provider: Adrian Blackwater MD Encounter: [facility] STRESS TEST   Page 1 TESTS    [facility] [address] [place], [place] [address] [phone] STUDY:  Gated Stress / Rest Myocardial Perfusion Imaging Tomographic (SPECT) Including attenuation correction Wall Motion, Left Ventricular Ejection Fraction By Gated Technique.Persantine Stress Test. SEX: Female   WEIGHT: 173 lbs   HEIGHT: 64 in       ARMS UP: YES/NO  REFERRING PHYSICIAN: Dr.Song Garris [place] Flakes                                                                                                                                                                                                                        INDICATION FOR STUDY: CP                                                                                                                                                                                                                    TECHNIQUE:  Approximately 20 minutes following the intravenous administration of 10.0 mCi of Tc-56m Sestamibi after stress testing in a reclined supine position with arms above their head if able to do so, gated SPECT imaging of the heart was performed. After about a 2hr break, the patient was injected intravenously with 31.1 mCi of Tc-59m Sestamibi.  Approximately 45 minutes later in the same position as stress imaging SPECT rest imaging of the heart was performed.  STRESS BY:  Helen Blackwater, MD PROTOCOL:  Persantine  DOSE ADMIN: 8.8 cc   ROUTE OF ADMINISTRATION: IV  MAX PRED HR: 142                     85%: 121               75%: 107                                                                                                                   RESTING BP: 136/74   RESTING HR: 61  PEAK BP: 134/64   PEAK HR: 83                                                                    EXERCISE DURATION:    4 min injection                                            REASON FOR TEST TERMINATION:    Protocol end                                                                                                                              SYMPTOMS:   None  EKG RESULTS:  NSR. 87/min. LBBB. No significant ST changes with persantine.                                                              IMAGE QUALITY:  Good                                                                                                                                                                                                                                                                                                                                 PERFUSION/WALL MOTION FINDINGS: EF = 61%. Small mild reversible basal anteroseptal, basal inferoseptal, mid anteroseptal, and mid inferoseptal wall defects, normal wall motion.                                                                           IMPRESSION: Ischemia in the LAD territory with normal LVEF, advise CCTA.  Helen Blackwater, MD Stress Interpreting Physician / Nuclear Interpreting Physician  Helen Blackwater MD  Electronically signed by: Helen Mccormick     Date: 01/03/2021 10:47  Patient: 5477.0 - Baldwin Crown Pound DOB:  01/21/1941  Date:  08/22/2018 11:00 Provider: Adrian Blackwater MD Encounter: ALL ANGIOGRAMS (CTA BRAIN, CAROTIDS, RENAL ARTERIES, PE)   Page 2 REASON FOR VISIT  Referred by Dr.Mahek Schlesinger [place] Flakes.  TESTS  Imaging: Computed Tomographic Angiography:  Cardiac multidetector CT was performed paying particular attention to the coronary arteries for the diagnosis of: Chest Pain. Diagnostic Drugs:  Administered iohexol (Omnipaque) through an antecubital vein and images from the examination were analyzed for the presence and extent of coronary artery disease, using 3D image processing software. 100 mL of non-ionic contrast (Omnipaque) was used.   TEST CONCLUSIONS  Quality of study: Excellent   1-Calcium score: 0  2-Right  dominant system.  3-Normal coronaries.  Helen Blackwater MD  Electronically signed by: Helen Mccormick     Date: 08/24/2018 15:04   ASSESSMENT AND PLAN:    ICD-10-CM   1. Syncope, unspecified syncope type  R55     2. Essential hypertension  I10     3. Nonischemic cardiomyopathy  I42.8     4. Dyslipidemia  E78.5        Problem List Items Addressed This Visit       Cardiovascular and Mediastinum   Nonischemic cardiomyopathy    Echo 06/2022 EF 55-60%, grade I DD      Essential hypertension    Controlled on recheck. Continue same medications.         Other   Syncope - Primary    Patient had syncopal episode after chemical nuclear stress test. Went to ED, received fluids. No further syncopal episodes. Denies chest pain, shortness of breath.       Dyslipidemia    LDL 59 05/2022. Continue rosuvastatin 40 mg daily.         Disposition:   Return in about 3 months (around 10/09/2022).    Total time spent: 30 minutes  Signed,  Helen Blackwater, MD  07/10/2022 10:57 AM    [facility]

## 2022-07-10 NOTE — Assessment & Plan Note (Signed)
Controlled on recheck. Continue same medications.

## 2022-07-13 ENCOUNTER — Encounter: Payer: Self-pay | Admitting: Internal Medicine

## 2022-07-13 ENCOUNTER — Ambulatory Visit (INDEPENDENT_AMBULATORY_CARE_PROVIDER_SITE_OTHER): Payer: Medicaid Other | Admitting: Internal Medicine

## 2022-07-13 VITALS — BP 130/78 | HR 77 | Ht 64.0 in | Wt 172.2 lb

## 2022-07-13 DIAGNOSIS — E1165 Type 2 diabetes mellitus with hyperglycemia: Secondary | ICD-10-CM | POA: Diagnosis not present

## 2022-07-13 DIAGNOSIS — I428 Other cardiomyopathies: Secondary | ICD-10-CM | POA: Diagnosis not present

## 2022-07-13 DIAGNOSIS — I10 Essential (primary) hypertension: Secondary | ICD-10-CM

## 2022-07-13 DIAGNOSIS — S22080D Wedge compression fracture of T11-T12 vertebra, subsequent encounter for fracture with routine healing: Secondary | ICD-10-CM

## 2022-07-13 DIAGNOSIS — R600 Localized edema: Secondary | ICD-10-CM | POA: Insufficient documentation

## 2022-07-13 LAB — POCT CBG (FASTING - GLUCOSE)-MANUAL ENTRY: Glucose Fasting, POC: 131 mg/dL — AB (ref 70–99)

## 2022-07-13 MED ORDER — FUROSEMIDE 20 MG PO TABS: ORAL_TABLET | ORAL | 0 refills | Status: DC

## 2022-07-13 NOTE — Progress Notes (Signed)
Established Patient Office Visit  Subjective:  Patient ID: Helen Mccormick, female    DOB: 10-01-1940  Age: 82 y.o. MRN: [id number]  Chief Complaint  Patient presents with   Follow-up    Hospital follow up    Patient comes in with complaints of noticing edema of both her lower legs.  Patient has recently been to the emergency room with history of syncopal episode and and a closed fracture of the T12 vertebrae.  Today she is wearing a back brace.  Patient already had chronic back pain and now it has been exacerbated with a T12 compression fracture. Patient notes that there is no increase in her shortness of breath or chest pain.  There is no cough, no fevers, no chills.  However she has been sitting around and most probably with her legs dangling.  So she noticed swelling of both her ankles and lower part of her legs.  There is no tenderness or pain in her legs.  There are no skin changes. Will add a small dose of Lasix to be taken only on Monday ,Wednesday and Fridays.  Patient also advised to keep her legs elevated when she is sitting down.    No other concerns at this time.   Past Medical History:  Diagnosis Date   Allergy    Anxiety    CHF (congestive heart failure)    Colon polyp    Depression    GERD (gastroesophageal reflux disease)    Heart murmur    Hyperlipidemia    Hypertension    Hypothyroidism    Sleep apnea    Thyroid disease     Past Surgical History:  Procedure Laterality Date   ABDOMINAL HYSTERECTOMY     BACK SURGERY     BACK SURGERY     CERVICAL FUSION     COLONOSCOPY WITH PROPOFOL N/A 05/28/2017   Procedure: COLONOSCOPY WITH PROPOFOL;  Surgeon: Wyline Mood, MD;  Location: [facility] ENDOSCOPY;  Service: Gastroenterology;  Laterality: N/A;   COLONOSCOPY WITH PROPOFOL N/A 11/17/2019   Procedure: COLONOSCOPY WITH PROPOFOL;  Surgeon: Regis Bill, MD;  Location: [facility] ENDOSCOPY;  Service: Endoscopy;  Laterality: N/A;   ESOPHAGOGASTRODUODENOSCOPY (EGD) WITH  PROPOFOL N/A 11/17/2019   Procedure: ESOPHAGOGASTRODUODENOSCOPY (EGD) WITH PROPOFOL;  Surgeon: Regis Bill, MD;  Location: [facility] ENDOSCOPY;  Service: Endoscopy;  Laterality: N/A;   NECK SURGERY     THYROID SURGERY     THYROID SURGERY      Social History   Socioeconomic History   Marital status: Single    Spouse name: Not on file   Number of children: Not on file   Years of education: Not on file   Highest education level: 10th grade  Occupational History   Occupation: retired  Tobacco Use   Smoking status: Never   Smokeless tobacco: Former    Types: Associate Professor Use: Never used  Substance and Sexual Activity   Alcohol use: No   Drug use: No   Sexual activity: Not Currently  Other Topics Concern   Not on file  Social History Narrative   Not on file   Social Determinants of Health   Financial Resource Strain: Low Risk  (04/27/2017)   Overall Financial Resource Strain (CARDIA)    Difficulty of Paying Living Expenses: Not hard at all  Food Insecurity: No Food Insecurity (04/27/2017)   Hunger Vital Sign    Worried About Running Out of Food in the Last Year: Never true  Ran Out of Food in the Last Year: Never true  Transportation Needs: No Transportation Needs (04/27/2017)   PRAPARE - Administrator, Civil Service (Medical): No    Lack of Transportation (Non-Medical): No  Physical Activity: Sufficiently Active (12/20/2018)   Exercise Vital Sign    Days of Exercise per Week: 4 days    Minutes of Exercise per Session: 60 min  Stress: No Stress Concern Present (03/12/2017)   [employer] of Occupational Health - Occupational Stress Questionnaire    Feeling of Stress : Only a little  Social Connections: Moderately Integrated (04/27/2017)   Social Connection and Isolation Panel [NHANES]    Frequency of Communication with Friends and Family: More than three times a week    Frequency of Social Gatherings with Friends and Family: Once a week     Attends Religious Services: More than 4 times per year    Active Member of [employer] or Organizations: Yes    Attends Banker Meetings: More than 4 times per year    Marital Status: Never married  Intimate Partner Violence: Unknown (03/12/2017)   Humiliation, Afraid, Rape, and Kick questionnaire    Fear of Current or Ex-Partner: Patient declined    Emotionally Abused: Patient declined    Physically Abused: Patient declined    Sexually Abused: Patient declined    Family History  Problem Relation Age of Onset   Breast cancer Maternal Aunt 67   Diabetes Mother    Leukemia Mother    Heart Problems Mother    Diabetes Sister    Mental illness Sister    Colon polyps Sister    Stroke Father    Cancer Brother        rectal   Healthy Daughter    Hypertension Son    Diabetes Sister    Thyroid disease Sister    Healthy Sister    Healthy Sister    Stroke Brother    Healthy Son    Healthy Son    Healthy Daughter     Allergies  Allergen Reactions   Amlodipine Hives and Swelling   Tramadol Other (See Comments)    Dizziness    Isosorbide Dinitrate     Other Reaction(s): Headache   Codeine Nausea Only   Etodolac Other (See Comments)    Other reaction(s): Abdominal Pain    Review of Systems  Constitutional:  Positive for malaise/fatigue. Negative for chills, diaphoresis, fever and weight loss.  HENT: Negative.  Negative for congestion, ear discharge, hearing loss and tinnitus.   Eyes: Negative.  Negative for blurred vision, double vision and pain.  Respiratory:  Negative for cough and shortness of breath.   Cardiovascular:  Positive for leg swelling. Negative for chest pain, palpitations, orthopnea and PND.  Gastrointestinal:  Negative for heartburn, nausea and vomiting.  Genitourinary:  Negative for frequency and urgency.  Musculoskeletal:  Positive for back pain, joint pain and myalgias.  Skin:  Negative for itching and rash.  Neurological:  Negative for dizziness  and headaches.  Psychiatric/Behavioral:  Negative for depression. The patient is not nervous/anxious.        Objective:   BP 130/78   Pulse 77   Ht  (1.626 m)   Wt 172 lb 3.2 oz (78.1 kg)   LMP  (LMP Unknown)   SpO2 97%   BMI 29.56 kg/m   Vitals:   07/13/22 1335  BP: 130/78  Pulse: 77  Height:  (1.626 m)  Weight: 172 lb  3.2 oz (78.1 kg)  SpO2: 97%  BMI (Calculated): 29.54    Physical Exam Vitals and nursing note reviewed.  Constitutional:      Appearance: Normal appearance.  Cardiovascular:     Rate and Rhythm: Normal rate and regular rhythm.     Pulses: Normal pulses.     Heart sounds: Normal heart sounds.  Pulmonary:     Effort: Pulmonary effort is normal.     Breath sounds: Normal breath sounds. No rhonchi or rales.  Abdominal:     General: Bowel sounds are normal. There is no distension.     Palpations: Abdomen is soft. There is no mass.  Musculoskeletal:        General: Normal range of motion.     Cervical back: Normal range of motion and neck supple.  Skin:    General: Skin is warm.  Neurological:     General: No focal deficit present.     Mental Status: She is alert.  Psychiatric:        Mood and Affect: Mood normal.        Behavior: Behavior normal.      Results for orders placed or performed in visit on 07/13/22  POCT CBG (Fasting - Glucose)  Result Value Ref Range   Glucose Fasting, POC 131 (A) 70 - 99 mg/dL        Assessment & Plan:  Patient advised to keep her legs elevated at all times. Patient will take Lasix 20 mg but only on Monday, Wednesday and Fridays.  Patient will let us know if her swelling gets worse. Problem List Items Addressed This Visit     DM (diabetes mellitus), type 2   Relevant Orders   POCT CBG (Fasting - Glucose) (Completed)   Nonischemic cardiomyopathy   Relevant Medications   furosemide (LASIX) 20 MG tablet   Compression fracture of T12 vertebra   Essential hypertension   Relevant Medications    furosemide (LASIX) 20 MG tablet   Bilateral leg edema - Primary   Relevant Medications   furosemide (LASIX) 20 MG tablet    Return in about 2 weeks (around 07/27/2022).   Total time spent: 25 minutes  [name], MD  07/13/2022

## 2022-07-14 ENCOUNTER — Other Ambulatory Visit: Payer: Self-pay | Admitting: Internal Medicine

## 2022-07-14 DIAGNOSIS — F419 Anxiety disorder, unspecified: Secondary | ICD-10-CM

## 2022-07-14 DIAGNOSIS — E890 Postprocedural hypothyroidism: Secondary | ICD-10-CM

## 2022-07-14 DIAGNOSIS — F411 Generalized anxiety disorder: Secondary | ICD-10-CM

## 2022-07-15 ENCOUNTER — Other Ambulatory Visit: Payer: Self-pay

## 2022-07-15 DIAGNOSIS — S22080S Wedge compression fracture of T11-T12 vertebra, sequela: Secondary | ICD-10-CM

## 2022-07-15 NOTE — Progress Notes (Unsigned)
Referring Physician:  Arnetha Courser, MD [address] [place],  [place] [address]  Primary Physician:  [name], MD  History of Present Illness: 07/16/2022 Helen Mccormick has a history of HTN, nonischemic cardiomyopathy, OSA, asthma, gastritis, DM, hyperlipidemia, hypothyroidism, and macular degeneration both eyes.   Seen in ED on 07/01/22 for T12 compression fracture s/p syncopal episode on 07/01/22. ED reviewed with Dr. Madaline Brilliant and he recommended TLSO brace and outpatient follow up.   Was seen back in ED on 07/08/22 with persistent back and flank pain. Was given oxycodone.   She is wearing her brace,  but does not have it on today. She has intermittent right sided LBP that can be worse with walking. She eats without brace on and if she gets up she has pain as well. No thoracic pain. No arm or leg pain. No numbness, tingling, or weakness.   She is taking prn oxycodone and prn flexeril. No refills needed.    Bowel/Bladder Dysfunction: none  Conservative measures:  Physical therapy:  denies Multimodal medical therapy including regular antiinflammatories: oxycodone, flexeril, mobic  Injections: denies epidural steroid injections  Past Surgery: denies  [facility] [place] has no symptoms of cervical myelopathy.  The symptoms are causing a significant impact on the patient's life.   Review of Systems:  A 10 point review of systems is negative, except for the pertinent positives and negatives detailed in the HPI.  Past Medical History: Past Medical History:  Diagnosis Date   Allergy    Anxiety    CHF (congestive heart failure)    Colon polyp    Depression    GERD (gastroesophageal reflux disease)    Heart murmur    Hyperlipidemia    Hypertension    Hypothyroidism    Sleep apnea    Thyroid disease     Past Surgical History: Past Surgical History:  Procedure Laterality Date   ABDOMINAL HYSTERECTOMY     BACK SURGERY     BACK SURGERY     CERVICAL FUSION      COLONOSCOPY WITH PROPOFOL N/A 05/28/2017   Procedure: COLONOSCOPY WITH PROPOFOL;  Surgeon: Wyline Mood, MD;  Location: [facility] ENDOSCOPY;  Service: Gastroenterology;  Laterality: N/A;   COLONOSCOPY WITH PROPOFOL N/A 11/17/2019   Procedure: COLONOSCOPY WITH PROPOFOL;  Surgeon: Regis Bill, MD;  Location: [facility] ENDOSCOPY;  Service: Endoscopy;  Laterality: N/A;   ESOPHAGOGASTRODUODENOSCOPY (EGD) WITH PROPOFOL N/A 11/17/2019   Procedure: ESOPHAGOGASTRODUODENOSCOPY (EGD) WITH PROPOFOL;  Surgeon: Regis Bill, MD;  Location: [facility] ENDOSCOPY;  Service: Endoscopy;  Laterality: N/A;   NECK SURGERY     THYROID SURGERY     THYROID SURGERY      Allergies: Allergies as of 07/16/2022 - Review Complete 07/16/2022  Allergen Reaction Noted   Amlodipine Hives and Swelling 12/24/2014   Tramadol Other (See Comments) 06/14/2019   Isosorbide dinitrate  07/06/2018   Codeine Nausea Only 09/11/2020   Etodolac Other (See Comments) 04/22/2016    Medications: Outpatient Encounter Medications as of 07/16/2022  Medication Sig   albuterol (PROVENTIL HFA;VENTOLIN HFA) 108 (90 Base) MCG/ACT inhaler Inhale into the lungs every 6 (six) hours as needed for wheezing or shortness of breath.   amLODipine (NORVASC) 5 MG tablet TAKE 1 TABLET(5 MG) BY MOUTH DAILY FOR BLOOD PRESSURE   aspirin 81 MG chewable tablet Chew 81 mg by mouth daily.    Azelastine HCl 137 MCG/SPRAY SOLN SMARTSIG:1-2 Spray(s) Both Nares Twice Daily   Blood Glucose Monitoring Suppl (ACCU-CHEK GUIDE) w/Device  KIT See admin instructions.   busPIRone (BUSPAR) 15 MG tablet Take one tablet by mouth daily   carvedilol (COREG) 12.5 MG tablet Take 1 tablet by mouth daily   cyclobenzaprine (FLEXERIL) 5 MG tablet Take 1 tablet (5 mg total) by mouth 3 (three) times daily as needed for muscle spasms.   ENTRESTO 24-26 MG Take 1 tablet by mouth 2 (two) times daily.   escitalopram (LEXAPRO) 10 MG tablet Take 1 tablet by mouth daily   FARXIGA 10 MG TABS tablet  Take 10 mg by mouth daily.   FLOVENT HFA 44 MCG/ACT inhaler SMARTSIG:2 Puff(s) By Mouth Twice Daily   furosemide (LASIX) 20 MG tablet To use one tab M,W,F   glucose blood test strip Use 1 strip to check your blood sugar two times daily.   isosorbide dinitrate (ISORDIL) 30 MG tablet Take 1 tablet by mouth daily   isosorbide mononitrate (IMDUR) 30 MG 24 hr tablet TAKE 1 TABLET(30 MG) BY MOUTH DAILY (Patient taking differently: Take 30 mg by mouth daily.)   levothyroxine (SYNTHROID) 75 MCG tablet Take 1 tablet by mouth daily   meloxicam (MOBIC) 15 MG tablet meloxicam 15 mg tablet  Take 1 tablet every day as needed for pain   nitroGLYCERIN (NITROSTAT) 0.4 MG SL tablet Place 1 tablet (0.4 mg total) under the tongue every 5 (five) minutes as needed for chest pain.   polyethylene glycol powder (GLYCOLAX/MIRALAX) 17 GM/SCOOP powder    RESTASIS 0.05 % ophthalmic emulsion Place 1 drop into both eyes 2 (two) times daily.   rosuvastatin (CRESTOR) 40 MG tablet Take 40 mg by mouth daily.   pantoprazole (PROTONIX) 20 MG tablet Take 1 tablet (20 mg total) by mouth daily. FOR ACID REFLUX AND STOMACH PAIN   No facility-administered encounter medications on file as of 07/16/2022.    Social History: Social History   Tobacco Use   Smoking status: Never   Smokeless tobacco: Former    Types: Associate Professor Use: Never used  Substance Use Topics   Alcohol use: No   Drug use: No    Family Medical History: Family History  Problem Relation Age of Onset   Breast cancer Maternal Aunt 44   Diabetes Mother    Leukemia Mother    Heart Problems Mother    Diabetes Sister    Mental illness Sister    Colon polyps Sister    Stroke Father    Cancer Brother        rectal   Healthy Daughter    Hypertension Son    Diabetes Sister    Thyroid disease Sister    Healthy Sister    Healthy Sister    Stroke Brother    Healthy Son    Healthy Son    Healthy Daughter     Physical Examination: Vitals:    07/16/22 1356  BP: (!) 140/82  Pulse: 74  SpO2: 96%    General: Patient is well developed, well nourished, calm, collected, and in no apparent distress. Attention to examination is appropriate.  Respiratory: Patient is breathing without any difficulty.   NEUROLOGICAL:     Awake, alert, oriented to person, place, and time.  Speech is clear and fluent. Fund of knowledge is appropriate.   Cranial Nerves: Pupils equal round and reactive to light.  Facial tone is symmetric.    No tenderness TL junction.  Mild right sided lower lumbar tenderness.   No abnormal lesions on exposed skin.   Strength: Side  Biceps Triceps Deltoid Interossei Grip Wrist Ext. Wrist Flex.  R 5 5 5 5 5 5 5   L 5 5 5 5 5 5 5    Side Iliopsoas Quads Hamstring PF DF EHL  R 5 5 5 5 5 5   L 5 5 5 5 5 5    Reflexes are 2+ and symmetric at the biceps, triceps, brachioradialis, patella and achilles.   Hoffman's is absent on right. She has positive hoffman's on left.  Clonus is not present.   Bilateral upper and lower extremity sensation is intact to light touch.     Gait is normal.     Medical Decision Making  Imaging: Lumbar xrays dated 07/16/22:  Stable T12 compression fracture. Slip at L3-L4 and L4-L5 with DDD L5-S1.   Radiology report not available for above xrays.   CT lumbar spine dated 07/01/22:  FINDINGS: Segmentation: 5 lumbar type vertebrae.   Alignment: 3-4 mm anterolisthesis L3-4 and L4-5 and 3 mm retrolisthesis L5-S1 noted, likely degenerative in nature. Otherwise normal lumbar lordosis.   Vertebrae: There is an acute central superior endplate fracture of T12 which is not involve the posterior wall and results in mild (approximately 10-15%) loss of height anteriorly. No retropulsion or listhesis. The posterior elements appear intact. No other fracture identified. No suspicious lytic or blastic bone lesion.   Paraspinal and other soft tissues: No paraspinal fluid collection or inflammatory  change. Moderate aortoiliac mixed atherosclerotic plaque.   Disc levels: Mild intervertebral disc space narrowing and endplate remodeling at L4-5 and vacuum disc phenomena at L5-S1 is present in keeping with changes of mild to moderate degenerative disc disease. Remaining intervertebral disc heights are preserved. Axial images demonstrate:   T11-T12: Moderate facet arthrosis. Resultant severe right neuroforaminal narrowing. No canal stenosis.   T12-L1: No neuroforaminal narrowing. No canal stenosis. Mild bilateral facet arthrosis.   L1-2: Mild bilateral facet arthrosis. No neuroforaminal narrowing. No canal stenosis.   L2-3: Mild facet arthrosis. No neuroforaminal narrowing. No canal stenosis.   L3-4: Mild broad-based disc bulge. Moderate bilateral facet arthrosis. No neuroforaminal narrowing. Minimal canal stenosis.   L4-5: Moderate broad-based disc bulge. Marked bilateral facet arthrosis. Resultant mild bilateral neuroforaminal narrowing and moderate central canal stenosis with possible impingement of the lateral recesses bilaterally and crossing L5 nerve roots.   L5-S1 mild bilateral facet arthrosis. No neuroforaminal narrowing. No canal stenosis.   IMPRESSION: 1. Acute central superior endplate fracture of T12 resulting in mild (approximately 10-15%) loss of height anteriorly. No retropulsion or listhesis. 2. Multilevel degenerative disc and degenerative joint disease resulting in multilevel neuroforaminal narrowing as described above. 3. 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 This would be better assessed with MRI examination if indicated. 4. Aortic atherosclerosis.   Aortic Atherosclerosis (ICD10-I70.0).     Electronically Signed   By: Helyn Numbers M.D.   On: 07/01/2022 20:18    I have personally reviewed the images and agree with the above  interpretation.  Assessment and Plan: Helen Mccormick is a pleasant 82 y.o. female has known T12 compression fracture s/p syncopal episode on 07/01/22.   She has intermittent right sided LBP that can be worse with walking. No thoracic pain. No arm or leg pain. No numbness, tingling, or weakness.   Xrays show stable T12 compression fracture.   Treatment options discussed with patient and following plan made:   - Continue with TLSO brace. Do not need to wear to eat, bathe,  sleep.  - No bending, twisting, or lifting.  - Continue prn oxycodone.Reviewed dosing and side effects.  - Continue with prn flexeril. Reviewed dosing and side effects. Discussed this can cause drowsiness.  - Follow up with me in 4-6 weeks with repeat xrays.  - Discussed she would likely be in brace for 3 months.   I spent a total of 25 minutes in face-to-face and non-face-to-face activities related to this patient's care today including review of outside records, review of imaging, review of symptoms, physical exam, discussion of differential diagnosis, discussion of treatment options, and documentation.   Thank you for involving me in the care of this patient.   Drake Leach PA-C Dept. of Neurosurgery

## 2022-07-16 ENCOUNTER — Encounter: Payer: Self-pay | Admitting: Orthopedic Surgery

## 2022-07-16 ENCOUNTER — Ambulatory Visit
Admission: RE | Admit: 2022-07-16 | Discharge: 2022-07-16 | Disposition: A | Discharge status: Home / Self Care | Payer: 59 | Attending: Orthopedic Surgery | Admitting: Orthopedic Surgery

## 2022-07-16 ENCOUNTER — Ambulatory Visit (INDEPENDENT_AMBULATORY_CARE_PROVIDER_SITE_OTHER): Payer: 59 | Admitting: Orthopedic Surgery

## 2022-07-16 ENCOUNTER — Ambulatory Visit
Admission: RE | Admit: 2022-07-16 | Discharge: 2022-07-16 | Disposition: A | Discharge status: Home / Self Care | Payer: 59 | Source: Ambulatory Visit | Attending: Orthopedic Surgery | Admitting: Orthopedic Surgery

## 2022-07-16 VITALS — BP 140/82 | HR 74 | Ht 64.0 in | Wt 169.0 lb

## 2022-07-16 DIAGNOSIS — S22080S Wedge compression fracture of T11-T12 vertebra, sequela: Secondary | ICD-10-CM | POA: Diagnosis present

## 2022-07-16 DIAGNOSIS — S22080A Wedge compression fracture of T11-T12 vertebra, initial encounter for closed fracture: Secondary | ICD-10-CM

## 2022-07-16 DIAGNOSIS — S22080D Wedge compression fracture of T11-T12 vertebra, subsequent encounter for fracture with routine healing: Secondary | ICD-10-CM

## 2022-07-16 NOTE — Patient Instructions (Signed)
It was so nice to see you today. Thank you so much for coming in.    You have a compression fracture at T12. Xrays from today show it is stable.   Continue to wear your brace. You can take off to eat and sleep. You can remove if you are sitting and watching TV.   No bending, twisting, or lifting.   I ordered xrays of your back to get done prior to your next appointment. You can get these at [facility] (building with the white pillars) off of [address]. The address is [address] [place], [place] [address]. You do not need any appointment.   Continue on oxycodone only as needed to help with severe pain. It can make you sleepy and/or constipated.   Continue on cyclobenzaprine as needed to help with muscle spasms. Use only as needed and be careful, this can make you sleepy.   I will see you back in 4-6 weeks. Please do not hesitate to call if you have any questions or concerns. You can also message me in MyChart.   Drake Leach PA-C [phone]

## 2022-07-20 ENCOUNTER — Encounter: Payer: Self-pay | Admitting: Internal Medicine

## 2022-07-21 ENCOUNTER — Other Ambulatory Visit: Payer: Self-pay

## 2022-07-27 ENCOUNTER — Ambulatory Visit (INDEPENDENT_AMBULATORY_CARE_PROVIDER_SITE_OTHER): Payer: Medicaid Other | Admitting: Internal Medicine

## 2022-07-27 ENCOUNTER — Encounter: Payer: Self-pay | Admitting: Internal Medicine

## 2022-07-27 VITALS — BP 120/72 | HR 61 | Ht 64.0 in | Wt 172.0 lb

## 2022-07-27 DIAGNOSIS — I25118 Atherosclerotic heart disease of native coronary artery with other forms of angina pectoris: Secondary | ICD-10-CM | POA: Insufficient documentation

## 2022-07-27 DIAGNOSIS — E1165 Type 2 diabetes mellitus with hyperglycemia: Secondary | ICD-10-CM | POA: Diagnosis not present

## 2022-07-27 DIAGNOSIS — M5442 Lumbago with sciatica, left side: Secondary | ICD-10-CM | POA: Diagnosis not present

## 2022-07-27 DIAGNOSIS — I10 Essential (primary) hypertension: Secondary | ICD-10-CM | POA: Diagnosis not present

## 2022-07-27 DIAGNOSIS — I428 Other cardiomyopathies: Secondary | ICD-10-CM

## 2022-07-27 DIAGNOSIS — G8929 Other chronic pain: Secondary | ICD-10-CM | POA: Insufficient documentation

## 2022-07-27 DIAGNOSIS — E782 Mixed hyperlipidemia: Secondary | ICD-10-CM | POA: Insufficient documentation

## 2022-07-27 LAB — POCT CBG (FASTING - GLUCOSE)-MANUAL ENTRY: Glucose Fasting, POC: 124 mg/dL — AB (ref 70–99)

## 2022-07-27 MED ORDER — GABAPENTIN 300 MG PO CAPS
300.0000 mg | ORAL_CAPSULE | Freq: Three times a day (TID) | ORAL | 3 refills | Status: DC
Start: 2022-07-27 — End: 2022-12-01

## 2022-07-27 MED ORDER — GABAPENTIN 100 MG PO CAPS
100.0000 mg | ORAL_CAPSULE | Freq: Three times a day (TID) | ORAL | 2 refills | Status: DC
Start: 2022-07-27 — End: 2022-07-27

## 2022-07-27 MED ORDER — ENTRESTO 24-26 MG PO TABS: 1.0000 | ORAL_TABLET | Freq: Two times a day (BID) | ORAL | 3 refills | Status: DC

## 2022-07-27 NOTE — Progress Notes (Signed)
Established Patient Office Visit  Subjective:  Patient ID: Helen Mccormick, female    DOB: Jun 21, 1940  Age: 82 y.o. MRN: [id number]  Chief Complaint  Patient presents with   Follow-up    2 week follow up    Comes in for follow-up with her granddaughter.  She continues to have back pain.  She received some epidural injections Not much improvement.  She mentions getting confused easily and thinks it is because of the buspirone.  Will d/c BuSpar.  Patient is also taking pain medications which can increase her sedation and confusion.  At one  point patient was taking  gabapentin.  Agrees to resume that so as to reduce her oxycodone intake. Her labs are not due until June. Her Cologuard test was negative.    No other concerns at this time.   Past Medical History:  Diagnosis Date   Allergy    Anxiety    CHF (congestive heart failure) (HCC)    Colon polyp    Depression    GERD (gastroesophageal reflux disease)    Heart murmur    Hyperlipidemia    Hypertension    Hypothyroidism    Sleep apnea    Thyroid disease     Past Surgical History:  Procedure Laterality Date   ABDOMINAL HYSTERECTOMY     BACK SURGERY     BACK SURGERY     CERVICAL FUSION     COLONOSCOPY WITH PROPOFOL N/A 05/28/2017   Procedure: COLONOSCOPY WITH PROPOFOL;  Surgeon: Wyline Mood, MD;  Location: [facility] ENDOSCOPY;  Service: Gastroenterology;  Laterality: N/A;   COLONOSCOPY WITH PROPOFOL N/A 11/17/2019   Procedure: COLONOSCOPY WITH PROPOFOL;  Surgeon: Regis Bill, MD;  Location: [facility] ENDOSCOPY;  Service: Endoscopy;  Laterality: N/A;   ESOPHAGOGASTRODUODENOSCOPY (EGD) WITH PROPOFOL N/A 11/17/2019   Procedure: ESOPHAGOGASTRODUODENOSCOPY (EGD) WITH PROPOFOL;  Surgeon: Regis Bill, MD;  Location: [facility] ENDOSCOPY;  Service: Endoscopy;  Laterality: N/A;   NECK SURGERY     THYROID SURGERY     THYROID SURGERY      Social History   Socioeconomic History   Marital status: Single    Spouse name: Not on  file   Number of children: Not on file   Years of education: Not on file   Highest education level: 10th grade  Occupational History   Occupation: retired  Tobacco Use   Smoking status: Never   Smokeless tobacco: Former    Types: Associate Professor Use: Never used  Substance and Sexual Activity   Alcohol use: No   Drug use: No   Sexual activity: Not Currently  Other Topics Concern   Not on file  Social History Narrative   Not on file   Social Determinants of Health   Financial Resource Strain: Low Risk  (04/27/2017)   Overall Financial Resource Strain (CARDIA)    Difficulty of Paying Living Expenses: Not hard at all  Food Insecurity: No Food Insecurity (04/27/2017)   Hunger Vital Sign    Worried About Running Out of Food in the Last Year: Never true    Ran Out of Food in the Last Year: Never true  Transportation Needs: No Transportation Needs (04/27/2017)   PRAPARE - Administrator, Civil Service (Medical): No    Lack of Transportation (Non-Medical): No  Physical Activity: Sufficiently Active (12/20/2018)   Exercise Vital Sign    Days of Exercise per Week: 4 days    Minutes of Exercise per Session:  60 min  Stress: No Stress Concern Present (03/12/2017)   [employer] of Occupational Health - Occupational Stress Questionnaire    Feeling of Stress : Only a little  Social Connections: Moderately Integrated (04/27/2017)   Social Connection and Isolation Panel [NHANES]    Frequency of Communication with Friends and Family: More than three times a week    Frequency of Social Gatherings with Friends and Family: Once a week    Attends Religious Services: More than 4 times per year    Active Member of [employer] or Organizations: Yes    Attends Banker Meetings: More than 4 times per year    Marital Status: Never married  Intimate Partner Violence: Unknown (03/12/2017)   Humiliation, Afraid, Rape, and Kick questionnaire    Fear of Current or  Ex-Partner: Patient declined    Emotionally Abused: Patient declined    Physically Abused: Patient declined    Sexually Abused: Patient declined    Family History  Problem Relation Age of Onset   Breast cancer Maternal Aunt 67   Diabetes Mother    Leukemia Mother    Heart Problems Mother    Diabetes Sister    Mental illness Sister    Colon polyps Sister    Stroke Father    Cancer Brother        rectal   Healthy Daughter    Hypertension Son    Diabetes Sister    Thyroid disease Sister    Healthy Sister    Healthy Sister    Stroke Brother    Healthy Son    Healthy Son    Healthy Daughter     Allergies  Allergen Reactions   Amlodipine Hives and Swelling   Tramadol Other (See Comments)    Dizziness    Isosorbide Dinitrate     Other Reaction(s): Headache   Codeine Nausea Only   Etodolac Other (See Comments)    Other reaction(s): Abdominal Pain    Review of Systems  Constitutional:  Positive for diaphoresis and malaise/fatigue. Negative for fever and weight loss.  HENT: Negative.    Eyes: Negative.   Respiratory:  Negative for cough, hemoptysis, shortness of breath and wheezing.   Cardiovascular:  Negative for chest pain, palpitations, orthopnea, leg swelling and PND.  Gastrointestinal:  Negative for abdominal pain, diarrhea, heartburn, nausea and vomiting.  Genitourinary:  Negative for dysuria, flank pain, frequency, hematuria and urgency.  Musculoskeletal:  Positive for back pain, joint pain and myalgias. Negative for falls and neck pain.  Neurological:  Negative for dizziness, tingling, tremors, focal weakness, weakness and headaches.  Psychiatric/Behavioral:  Negative for depression. The patient is not nervous/anxious.        Objective:   BP 120/72   Pulse 61   Ht 5\' 4"  (1.626 m)   Wt 172 lb (78 kg)   LMP  (LMP Unknown)   SpO2 98%   BMI 29.52 kg/m   Vitals:   07/27/22 0956  BP: 120/72  Pulse: 61  Height: 5\' 4"  (1.626 m)  Weight: 172 lb (78 kg)   SpO2: 98%  BMI (Calculated): 29.51    Physical Exam Vitals and nursing note reviewed.  Constitutional:      Appearance: Normal appearance.  Cardiovascular:     Rate and Rhythm: Normal rate and regular rhythm.     Pulses: Normal pulses.     Heart sounds: Normal heart sounds. No murmur heard. Pulmonary:     Effort: Pulmonary effort is normal.  Breath sounds: Normal breath sounds. No wheezing, rhonchi or rales.  Abdominal:     General: Bowel sounds are normal.     Palpations: Abdomen is soft. There is no mass.     Tenderness: There is no abdominal tenderness.  Musculoskeletal:        General: No swelling or tenderness. Normal range of motion.     Cervical back: Normal range of motion.     Right lower leg: No edema.     Left lower leg: No edema.  Skin:    General: Skin is warm.  Neurological:     General: No focal deficit present.     Mental Status: She is alert and oriented to person, place, and time.  Psychiatric:        Mood and Affect: Mood normal.        Behavior: Behavior normal.      Results for orders placed or performed in visit on 07/27/22  POCT CBG (Fasting - Glucose)  Result Value Ref Range   Glucose Fasting, POC 124 (A) 70 - 99 mg/dL    Recent Results (from the past 2160 hour(s))  POCT CBG (Fasting - Glucose)     Status: Abnormal   Collection Time: 06/01/22 10:11 AM  Result Value Ref Range   Glucose Fasting, POC 102 (A) 70 - 99 mg/dL  CBC With Differential     Status: Abnormal   Collection Time: 06/01/22 11:07 AM  Result Value Ref Range   WBC 5.9 3.4 - 10.8 x10E3/uL   RBC 4.77 3.77 - 5.28 x10E6/uL   Hemoglobin 11.9 11.1 - 15.9 g/dL   Hematocrit 16.1 09.6 - 46.6 %   MCV 78 (L) 79 - 97 fL   MCH 24.9 (L) 26.6 - 33.0 pg   MCHC 32.0 31.5 - 35.7 g/dL   RDW 04.5 40.9 - 81.1 %   Neutrophils 32 Not Estab. %   Lymphs 56 Not Estab. %   Monocytes 8 Not Estab. %   Eos 3 Not Estab. %   Basos 1 Not Estab. %   Neutrophils Absolute 1.9 1.4 - 7.0 x10E3/uL    Lymphocytes Absolute 3.3 (H) 0.7 - 3.1 x10E3/uL   Monocytes Absolute 0.4 0.1 - 0.9 x10E3/uL   EOS (ABSOLUTE) 0.2 0.0 - 0.4 x10E3/uL   Basophils Absolute 0.0 0.0 - 0.2 x10E3/uL   Immature Granulocytes 0 Not Estab. %   Immature Grans (Abs) 0.0 0.0 - 0.1 x10E3/uL  CMP14+EGFR     Status: Abnormal   Collection Time: 06/01/22 11:07 AM  Result Value Ref Range   Glucose 91 70 - 99 mg/dL   BUN 19 8 - 27 mg/dL   Creatinine, Ser 9.14 0.57 - 1.00 mg/dL   eGFR 63 >78 GN/FAO/1.30   BUN/Creatinine Ratio 20 12 - 28   Sodium 145 (H) 134 - 144 mmol/L   Potassium 4.0 3.5 - 5.2 mmol/L   Chloride 105 96 - 106 mmol/L   CO2 24 20 - 29 mmol/L   Calcium 9.1 8.7 - 10.3 mg/dL   Total Protein 6.7 6.0 - 8.5 g/dL   Albumin 4.2 3.8 - 4.8 g/dL   Globulin, Total 2.5 1.5 - 4.5 g/dL   Albumin/Globulin Ratio 1.7 1.2 - 2.2   Bilirubin Total 0.4 0.0 - 1.2 mg/dL   Alkaline Phosphatase 79 44 - 121 IU/L   AST 25 0 - 40 IU/L   ALT 24 0 - 32 IU/L  Lipid Panel w/o Chol/HDL Ratio     Status: None  Collection Time: 06/01/22 11:07 AM  Result Value Ref Range   Cholesterol, Total 121 100 - 199 mg/dL   Triglycerides 91 0 - 149 mg/dL   HDL 44 >16 mg/dL   VLDL Cholesterol Cal 18 5 - 40 mg/dL   LDL Chol Calc (NIH) 59 0 - 99 mg/dL  Hemoglobin X0R     Status: Abnormal   Collection Time: 06/01/22 11:07 AM  Result Value Ref Range   Hgb A1c MFr Bld 6.9 (H) 4.8 - 5.6 %    Comment:          Prediabetes: 5.7 - 6.4          Diabetes: >6.4          Glycemic control for adults with diabetes: <7.0    Est. average glucose Bld gHb Est-mCnc 151 mg/dL  Microalbumin, urine     Status: None   Collection Time: 06/01/22 11:43 AM  Result Value Ref Range   Microalbumin, Urine 11.5 Not Estab. ug/mL  Hepatic function panel     Status: Abnormal   Collection Time: 07/01/22  2:15 PM  Result Value Ref Range   Total Protein 6.4 (L) 6.5 - 8.1 g/dL   Albumin 3.5 3.5 - 5.0 g/dL   AST 31 15 - 41 U/L   ALT 28 0 - 44 U/L   Alkaline Phosphatase 68  38 - 126 U/L   Total Bilirubin 0.7 0.3 - 1.2 mg/dL   Bilirubin, Direct <6.0 0.0 - 0.2 mg/dL   Indirect Bilirubin NOT CALCULATED 0.3 - 0.9 mg/dL    Comment: Performed at [facility], [address] [place], [place] [address]  Lipase, blood     Status: None   Collection Time: 07/01/22  2:15 PM  Result Value Ref Range   Lipase 23 11 - 51 U/L    Comment: Performed at [facility], [address] [place], [place] [address]  Basic metabolic panel     Status: Abnormal   Collection Time: 07/01/22  2:16 PM  Result Value Ref Range   Sodium 140 135 - 145 mmol/L   Potassium 3.8 3.5 - 5.1 mmol/L   Chloride 107 98 - 111 mmol/L   CO2 24 22 - 32 mmol/L   Glucose, Bld 136 (H) 70 - 99 mg/dL    Comment: Glucose reference range applies only to samples taken after fasting for at least 8 hours.   BUN 20 8 - 23 mg/dL   Creatinine, Ser 4.78 0.44 - 1.00 mg/dL   Calcium 8.7 (L) 8.9 - 10.3 mg/dL   GFR, Estimated >29 >56 mL/min    Comment: (NOTE) Calculated using the CKD-EPI Creatinine Equation (2021)    Anion gap 9 5 - 15    Comment: Performed at [facility], [address] [place], [place] [address]  CBC     Status: Abnormal   Collection Time: 07/01/22  2:16 PM  Result Value Ref Range   WBC 5.1 4.0 - 10.5 K/uL   RBC 4.54 3.87 - 5.11 MIL/uL   Hemoglobin 11.1 (L) 12.0 - 15.0 g/dL   HCT 65.7 (L) 84.6 - 96.2 %   MCV 79.1 (L) 80.0 - 100.0 fL   MCH 24.4 (L) 26.0 - 34.0 pg   MCHC 30.9 30.0 - 36.0 g/dL   RDW 95.2 84.1 - 32.4 %   Platelets 179 150 - 400 K/uL   nRBC 0.0 0.0 - 0.2 %    Comment: Performed at [facility], [address]  [place], [place] [address]  CBG monitoring, ED     Status: Abnormal   Collection Time: 07/01/22  6:48 PM  Result Value Ref Range   Glucose-Capillary 183 (H) 70 - 99 mg/dL    Comment: Glucose reference range applies only to samples taken after fasting for at least 8 hours.  Troponin I (High Sensitivity)     Status: None    Collection Time: 07/01/22  6:54 PM  Result Value Ref Range   Troponin I (High Sensitivity) 5 <18 ng/L    Comment: (NOTE) Elevated high sensitivity troponin I (hsTnI) values and significant  changes across serial measurements may suggest ACS but many other  chronic and acute conditions are known to elevate hsTnI results.  Refer to the "Links" section for chest pain algorithms and additional  guidance. Performed at [facility], [address] [place], [place] [address]   Magnesium     Status: None   Collection Time: 07/01/22  7:00 PM  Result Value Ref Range   Magnesium 2.2 1.7 - 2.4 mg/dL    Comment: Performed at [facility], [address] [place], [place] [address]  Urinalysis, Routine w reflex microscopic -Urine, Clean Catch     Status: Abnormal   Collection Time: 07/01/22  8:30 PM  Result Value Ref Range   Color, Urine STRAW (A) YELLOW   APPearance CLEAR (A) CLEAR   Specific Gravity, Urine 1.037 (H) 1.005 - 1.030   pH 5.0 5.0 - 8.0   Glucose, UA >=500 (A) NEGATIVE mg/dL   Hgb urine dipstick NEGATIVE NEGATIVE   Bilirubin Urine NEGATIVE NEGATIVE   Ketones, ur NEGATIVE NEGATIVE mg/dL   Protein, ur NEGATIVE NEGATIVE mg/dL   Nitrite NEGATIVE NEGATIVE   Leukocytes,Ua NEGATIVE NEGATIVE   RBC / HPF 0-5 0 - 5 RBC/hpf   WBC, UA 0-5 0 - 5 WBC/hpf   Bacteria, UA NONE SEEN NONE SEEN   Squamous Epithelial / HPF 0-5 0 - 5 /HPF    Comment: Performed at [facility], [address] [place], [place] [address]  Troponin I (High Sensitivity)     Status: None   Collection Time: 07/02/22 12:10 AM  Result Value Ref Range   Troponin I (High Sensitivity) 6 <18 ng/L    Comment: (NOTE) Elevated high sensitivity troponin I (hsTnI) values and significant  changes across serial measurements may suggest ACS but many other  chronic and acute conditions are known to elevate hsTnI results.  Refer to the "Links" section for chest pain algorithms and additional   guidance. Performed at [facility], [address] [place], [place] [address]   CBG monitoring, ED     Status: Abnormal   Collection Time: 07/02/22  4:36 AM  Result Value Ref Range   Glucose-Capillary 108 (H) 70 - 99 mg/dL    Comment: Glucose reference range applies only to samples taken after fasting for at least 8 hours.   Comment 1 Document in Chart   Basic metabolic panel     Status: Abnormal   Collection Time: 07/02/22  4:41 AM  Result Value Ref Range   Sodium 141 135 - 145 mmol/L   Potassium 3.4 (L) 3.5 - 5.1 mmol/L   Chloride 109 98 - 111 mmol/L   CO2 25 22 - 32 mmol/L   Glucose, Bld 99 70 - 99 mg/dL    Comment: Glucose reference range applies only to samples taken after fasting for at least 8 hours.   BUN 18 8 - 23 mg/dL  Creatinine, Ser 0.80 0.44 - 1.00 mg/dL   Calcium 9.2 8.9 - 16.1 mg/dL   GFR, Estimated >09 >60 mL/min    Comment: (NOTE) Calculated using the CKD-EPI Creatinine Equation (2021)    Anion gap 7 5 - 15    Comment: Performed at [facility], [address] [place], [place] [address]  CBC     Status: Abnormal   Collection Time: 07/02/22  4:41 AM  Result Value Ref Range   WBC 6.5 4.0 - 10.5 K/uL   RBC 4.96 3.87 - 5.11 MIL/uL   Hemoglobin 12.2 12.0 - 15.0 g/dL   HCT 81.1 91.4 - 78.2 %   MCV 77.2 (L) 80.0 - 100.0 fL   MCH 24.6 (L) 26.0 - 34.0 pg   MCHC 31.9 30.0 - 36.0 g/dL   RDW 95.6 21.3 - 08.6 %   Platelets 183 150 - 400 K/uL   nRBC 0.0 0.0 - 0.2 %    Comment: Performed at [facility], [address] [place], [place] [address]  Troponin I (High Sensitivity)     Status: None   Collection Time: 07/02/22  4:41 AM  Result Value Ref Range   Troponin I (High Sensitivity) 9 <18 ng/L    Comment: (NOTE) Elevated high sensitivity troponin I (hsTnI) values and significant  changes across serial measurements may suggest ACS but many other  chronic and acute conditions are known to elevate hsTnI results.  Refer to the  "Links" section for chest pain algorithms and additional  guidance. Performed at [facility], [address] [place], [place] [address]   Troponin I (High Sensitivity)     Status: None   Collection Time: 07/02/22  6:36 AM  Result Value Ref Range   Troponin I (High Sensitivity) 9 <18 ng/L    Comment: (NOTE) Elevated high sensitivity troponin I (hsTnI) values and significant  changes across serial measurements may suggest ACS but many other  chronic and acute conditions are known to elevate hsTnI results.  Refer to the "Links" section for chest pain algorithms and additional  guidance. Performed at [facility], [address] [place], [place] [address]   CBG monitoring, ED     Status: Abnormal   Collection Time: 07/02/22  9:04 AM  Result Value Ref Range   Glucose-Capillary 123 (H) 70 - 99 mg/dL    Comment: Glucose reference range applies only to samples taken after fasting for at least 8 hours.  ECHOCARDIOGRAM COMPLETE     Status: None   Collection Time: 07/02/22 10:07 AM  Result Value Ref Range   Weight 2,592 oz   Height 66 in   BP 176/85 mmHg   Ao pk vel 1.20 m/s   AV Area VTI 2.95 cm2   AR max vel 2.48 cm2   AV Mean grad 3.0 mmHg   AV Peak grad 5.8 mmHg   S' Lateral 3.30 cm   AV Area mean vel 2.86 cm2   Area-P 1/2 2.99 cm2   MV VTI 1.86 cm2   Est EF 55 - 60%   CBG monitoring, ED     Status: None   Collection Time: 07/02/22 12:02 PM  Result Value Ref Range   Glucose-Capillary 94 70 - 99 mg/dL    Comment: Glucose reference range applies only to samples taken after fasting for at least 8 hours.  Basic metabolic panel     Status: Abnormal   Collection Time: 07/08/22  3:12 PM  Result Value Ref Range  Sodium 142 135 - 145 mmol/L   Potassium 3.7 3.5 - 5.1 mmol/L   Chloride 106 98 - 111 mmol/L   CO2 26 22 - 32 mmol/L   Glucose, Bld 157 (H) 70 - 99 mg/dL    Comment: Glucose reference range applies only to samples taken after fasting for at least 8  hours.   BUN 20 8 - 23 mg/dL   Creatinine, Ser 1.61 0.44 - 1.00 mg/dL   Calcium 9.1 8.9 - 09.6 mg/dL   GFR, Estimated >04 >54 mL/min    Comment: (NOTE) Calculated using the CKD-EPI Creatinine Equation (2021)    Anion gap 10 5 - 15    Comment: Performed at [facility], [address] [place], [place] [address]  CBC     Status: Abnormal   Collection Time: 07/08/22  3:12 PM  Result Value Ref Range   WBC 6.6 4.0 - 10.5 K/uL   RBC 5.10 3.87 - 5.11 MIL/uL   Hemoglobin 12.4 12.0 - 15.0 g/dL   HCT 91.4 78.2 - 95.6 %   MCV 77.5 (L) 80.0 - 100.0 fL   MCH 24.3 (L) 26.0 - 34.0 pg   MCHC 31.4 30.0 - 36.0 g/dL   RDW 21.3 08.6 - 57.8 %   Platelets 184 150 - 400 K/uL   nRBC 0.0 0.0 - 0.2 %    Comment: Performed at [facility], [address] [place], [place] [address]  POCT CBG (Fasting - Glucose)     Status: Abnormal   Collection Time: 07/13/22  1:41 PM  Result Value Ref Range   Glucose Fasting, POC 131 (A) 70 - 99 mg/dL  POCT CBG (Fasting - Glucose)     Status: Abnormal   Collection Time: 07/27/22 10:02 AM  Result Value Ref Range   Glucose Fasting, POC 124 (A) 70 - 99 mg/dL      Assessment & Plan:  DC BuSpar.  Continue other medication.  Add gabapentin 100 mg qhs with  dose to be increased as tolerated. Problem List Items Addressed This Visit     DM (diabetes mellitus), type 2 (HCC) - Primary   Relevant Orders   POCT CBG (Fasting - Glucose) (Completed)   Nonischemic cardiomyopathy (HCC)   Relevant Medications   ENTRESTO 24-26 MG   Essential hypertension   Relevant Medications   ENTRESTO 24-26 MG   Chronic midline low back pain with left-sided sciatica   Relevant Medications   gabapentin (NEURONTIN) 300 MG capsule   Mixed hyperlipidemia   Relevant Medications   ENTRESTO 24-26 MG   Coronary artery disease of native artery of native heart with stable angina pectoris (HCC)   Relevant Medications   ENTRESTO 24-26 MG   gabapentin (NEURONTIN) 300 MG capsule     Follow up as scheduled.  Total time spent: 30 minutes  [name], MD  07/27/2022

## 2022-07-29 ENCOUNTER — Emergency Department
Admission: EM | Admit: 2022-07-29 | Discharge: 2022-07-29 | Disposition: A | Discharge status: Home / Self Care | Payer: 59 | Attending: Emergency Medicine | Admitting: Emergency Medicine

## 2022-07-29 ENCOUNTER — Encounter: Payer: Self-pay | Admitting: Emergency Medicine

## 2022-07-29 ENCOUNTER — Other Ambulatory Visit: Payer: Self-pay

## 2022-07-29 ENCOUNTER — Emergency Department: Payer: 59

## 2022-07-29 DIAGNOSIS — R55 Syncope and collapse: Secondary | ICD-10-CM | POA: Diagnosis not present

## 2022-07-29 DIAGNOSIS — R42 Dizziness and giddiness: Secondary | ICD-10-CM | POA: Insufficient documentation

## 2022-07-29 DIAGNOSIS — E039 Hypothyroidism, unspecified: Secondary | ICD-10-CM | POA: Diagnosis not present

## 2022-07-29 DIAGNOSIS — I110 Hypertensive heart disease with heart failure: Secondary | ICD-10-CM | POA: Insufficient documentation

## 2022-07-29 DIAGNOSIS — I509 Heart failure, unspecified: Secondary | ICD-10-CM | POA: Insufficient documentation

## 2022-07-29 DIAGNOSIS — I447 Left bundle-branch block, unspecified: Secondary | ICD-10-CM | POA: Diagnosis not present

## 2022-07-29 LAB — BASIC METABOLIC PANEL
Anion gap: 11 (ref 5–15)
BUN: 25 mg/dL — ABNORMAL HIGH (ref 8–23)
CO2: 24 mmol/L (ref 22–32)
Calcium: 8.7 mg/dL — ABNORMAL LOW (ref 8.9–10.3)
Chloride: 104 mmol/L (ref 98–111)
Creatinine, Ser: 0.87 mg/dL (ref 0.44–1.00)
GFR, Estimated: >60 mL/min (ref >60)
Glucose, Bld: 158 mg/dL — ABNORMAL HIGH (ref 70–99)
Potassium: 3.4 mmol/L — ABNORMAL LOW (ref 3.5–5.1)
Sodium: 139 mmol/L (ref 135–145)

## 2022-07-29 LAB — URINALYSIS, ROUTINE W REFLEX MICROSCOPIC
Bacteria, UA: NONE SEEN
Bilirubin Urine: NEGATIVE
Glucose, UA: >=500 mg/dL — AB
Hgb urine dipstick: NEGATIVE
Ketones, ur: NEGATIVE mg/dL
Leukocytes,Ua: NEGATIVE
Nitrite: NEGATIVE
Protein, ur: NEGATIVE mg/dL
Specific Gravity, Urine: 1.028 (ref 1.005–1.030)
pH: 5 (ref 5.0–8.0)

## 2022-07-29 LAB — CBC
HCT: 35.7 % — ABNORMAL LOW (ref 36.0–46.0)
Hemoglobin: 11.2 g/dL — ABNORMAL LOW (ref 12.0–15.0)
MCH: 24.3 pg — ABNORMAL LOW (ref 26.0–34.0)
MCHC: 31.4 g/dL (ref 30.0–36.0)
MCV: 77.4 fL — ABNORMAL LOW (ref 80.0–100.0)
Platelets: 197 10*3/uL (ref 150–400)
RBC: 4.61 MIL/uL (ref 3.87–5.11)
RDW: 15.8 % — ABNORMAL HIGH (ref 11.5–15.5)
WBC: 5.6 10*3/uL (ref 4.0–10.5)
nRBC: 0 % (ref 0.0–0.2)

## 2022-07-29 LAB — TROPONIN I (HIGH SENSITIVITY)
Troponin I (High Sensitivity): 5 ng/L (ref <18)
Troponin I (High Sensitivity): 6 ng/L (ref <18)

## 2022-07-29 MED ORDER — MECLIZINE HCL 12.5 MG PO TABS: 12.5000 mg | ORAL_TABLET | Freq: Three times a day (TID) | ORAL | 0 refills | Status: DC | PRN

## 2022-07-29 NOTE — ED Notes (Signed)
PT brought to ed rm 18H at this time, this RN now assuming care.

## 2022-07-29 NOTE — ED Provider Notes (Signed)
[facility] Provider Note    Event Date/Time   First MD Initiated Contact with Patient 07/29/22 2052     (approximate)   History   Dizziness   HPI  Helen Mccormick is a 82 y.o. female with a history of hypertension, hyperlipidemia, hypothyroidism, OSA, CHF, and anxiety who presents with 2 episodes of dizziness today which she describes as a sensation of movement and feel like she needed to hold onto things but also somewhat like lightheadedness.  The patient briefly felt that she might pass out.  One of the episodes occurred while she was standing to make a meal.  The episodes lasted for several minutes and then resolved.  She denies any associated headache, vision changes, weakness or numbness, difficulty walking, shortness of breath, chest pain, or any other acute symptoms.  She was admitted for syncope and back fracture a few weeks ago.  The patient reports some intermittent crackling in her right ear which is subacute in duration.  She denies any ringing or hearing loss.  I reviewed the past medical records.  The patient was admitted earlier this month and per the hospitalist discharge summary she was evaluated for syncope as well as a T12 fracture.  Echo from 4/4 did not show any significant findings.  She was seen again in the ED on 4/10 for worsening pain.   Physical Exam   Triage Vital Signs: ED Triage Vitals  Enc Vitals Group     BP 07/29/22 1722 129/61     Pulse Rate 07/29/22 1722 67     Resp 07/29/22 1722 18     Temp 07/29/22 1722 98.1 F (36.7 C)     Temp src --      SpO2 07/29/22 1722 98 %     Weight 07/29/22 1717 160 lb (72.6 kg)     Height 07/29/22 1717 5\' 4"  (1.626 m)     Head Circumference --      Peak Flow --      Pain Score 07/29/22 1717 0     Pain Loc --      Pain Edu? --      Excl. in GC? --     Most recent vital signs: Vitals:   07/29/22 1722 07/29/22 2250  BP: 129/61 138/60  Pulse: 67 70  Resp: 18 18  Temp: 98.1 F  (36.7 C)   SpO2: 98% 99%     General: Awake, no distress.  CV:  Good peripheral perfusion.  Resp:  Normal effort.  Abd:  No distention.  Other:  EOMI.  PERRLA.  No facial droop.  Motor intact in all extremities.  Normal coordination with no ataxia on finger-to-nose.  No pronator drift.  Right ear canal and TM appear normal.   ED Results / Procedures / Treatments   Labs (all labs ordered are listed, but only abnormal results are displayed) Labs Reviewed  BASIC METABOLIC PANEL - Abnormal; Notable for the following components:      Result Value   Potassium 3.4 (*)    Glucose, Bld 158 (*)    BUN 25 (*)    Calcium 8.7 (*)    All other components within normal limits  CBC - Abnormal; Notable for the following components:   Hemoglobin 11.2 (*)    HCT 35.7 (*)    MCV 77.4 (*)    MCH 24.3 (*)    RDW 15.8 (*)    All other components within normal limits  URINALYSIS, ROUTINE W  REFLEX MICROSCOPIC - Abnormal; Notable for the following components:   Color, Urine STRAW (*)    APPearance CLEAR (*)    Glucose, UA >=500 (*)    All other components within normal limits  TROPONIN I (HIGH SENSITIVITY)  TROPONIN I (HIGH SENSITIVITY)     EKG  ED ECG REPORT I, Dionne Bucy, the attending physician, personally viewed and interpreted this ECG.  Date: 07/29/2022 EKG Time: 1722 Rate: 65 Rhythm: normal sinus rhythm QRS Axis: Left axis Intervals: LBBB ST/T Wave abnormalities: normal Narrative Interpretation: no evidence of acute ischemia; no significant change when compared to EKG of 07/02/2022    RADIOLOGY  CT head: I independently viewed and interpreted the images; there is no ICH.  Radiology report indicates no acute abnormality.  PROCEDURES:  Critical Care performed: No  Procedures   MEDICATIONS ORDERED IN ED: Medications - No data to display   IMPRESSION / MDM / ASSESSMENT AND PLAN / ED COURSE  I reviewed the triage vital signs and the nursing  notes.  82 year old female with PMH as noted above and recent admission for syncope workup presents with 2 episodes of dizziness which she describes both as a movement or spinning as well as lightheadedness.  Exam is normal.  Differential diagnosis includes, but is not limited to, peripheral vertigo, dehydration, electrolyte abnormality, vasovagal episode, orthostatic hypotension, medication side effect.  I have a low suspicion for cardiac etiology especially given the patient's recent negative echocardiogram.  The presentation is not consistent with acute stroke or other CNS cause given the episodic nature and the normal neuroexam.  CT head was obtained from triage and is negative.  CBC shows a hemoglobin of 11.2.  BMP shows normal electrolytes.  Troponin is negative x 2.  Patient's presentation is most consistent with acute complicated illness / injury requiring diagnostic workup.  The patient is on the cardiac monitor to evaluate for evidence of arrhythmia and/or significant heart rate changes.  At this time the patient remains asymptomatic.  Given the negative ED workup and that recent syncope workup, there is no indication for admission at this time.  The patient feels well and is comfortable going home.  I counseled her on the results of the workup.  I will prescribe a trial of meclizine in case the symptoms are due to vertigo.  I instructed the patient to follow-up with her primary care provider.  I gave strict return precautions and she expresses understanding.   FINAL CLINICAL IMPRESSION(S) / ED DIAGNOSES   Final diagnoses:  Dizziness  Near syncope     Rx / DC Orders   ED Discharge Orders          Ordered    meclizine (ANTIVERT) 12.5 MG tablet  3 times daily PRN        07/29/22 2249             Note:  This document was prepared using Dragon voice recognition software and may include unintentional dictation errors.    Dionne Bucy, MD 07/30/22 [phone]

## 2022-07-29 NOTE — ED Notes (Signed)
ED Provider at bedside. 

## 2022-07-29 NOTE — Discharge Instructions (Addendum)
Your dizziness may be caused by vertigo as this can cause a spinning type of sensation and make you feel like you need to hold onto things.  You may take the meclizine to see if it helps with the symptoms.  Follow-up with your primary care doctor within the next week.  Return to the ER for new, worsening, or persistent severe dizziness or weakness, recurrent episodes of feeling like you are going to pass out, weakness or numbness in your arms or legs, difficulty with speaking or walking, changes in your vision, or any other new or worsening symptoms that concern you.

## 2022-07-29 NOTE — ED Triage Notes (Signed)
Pt via [facility] from home. Pt states she went into the kitchen and had a sudden onset of dizziness and lightheadedness. Denies pain. Denies any syncopal episode. States that 3 weeks ago she had an actual syncopal episode and since then she has not felt right. Pt is A&OX4 and NAD

## 2022-07-29 NOTE — ED Triage Notes (Signed)
First nurse note: Arrived by EMS from home with c/o sudden onset of dizziness. EMS reports able to ambulate with them.  EMS vitals: 158/70 b/p 99% RA [id number] [id number]

## 2022-07-29 NOTE — ED Notes (Signed)
Pt denies any dizziness at this time

## 2022-07-30 ENCOUNTER — Ambulatory Visit
Admission: RE | Admit: 2022-07-30 | Discharge: 2022-07-30 | Disposition: A | Discharge status: Home / Self Care | Payer: 59 | Source: Ambulatory Visit | Attending: Internal Medicine | Admitting: Internal Medicine

## 2022-07-30 DIAGNOSIS — Z1231 Encounter for screening mammogram for malignant neoplasm of breast: Secondary | ICD-10-CM | POA: Insufficient documentation

## 2022-07-31 ENCOUNTER — Telehealth: Payer: Self-pay

## 2022-07-31 ENCOUNTER — Other Ambulatory Visit: Payer: Self-pay | Admitting: Internal Medicine

## 2022-07-31 DIAGNOSIS — R600 Localized edema: Secondary | ICD-10-CM

## 2022-07-31 MED ORDER — FUROSEMIDE 20 MG PO TABS
ORAL_TABLET | ORAL | 0 refills | Status: DC
Start: 2022-07-31 — End: 2022-12-01

## 2022-07-31 NOTE — Telephone Encounter (Signed)
Pt's granddaughter called and left vm regarding pt's rx lasix. Said pt misplaced the bottle, they have looked around the house trying to find it but cannot find it. She asked if we can send rx refill again? Please advise

## 2022-08-04 ENCOUNTER — Other Ambulatory Visit: Payer: Self-pay | Admitting: Internal Medicine

## 2022-08-04 MED ORDER — EMPAGLIFLOZIN 25 MG PO TABS: 25.0000 mg | ORAL_TABLET | Freq: Every day | ORAL | 3 refills | Status: DC

## 2022-08-05 ENCOUNTER — Encounter: Payer: Self-pay | Admitting: Emergency Medicine

## 2022-08-05 ENCOUNTER — Other Ambulatory Visit: Payer: Self-pay

## 2022-08-05 ENCOUNTER — Emergency Department: Payer: 59

## 2022-08-05 DIAGNOSIS — W19XXXA Unspecified fall, initial encounter: Secondary | ICD-10-CM | POA: Insufficient documentation

## 2022-08-05 DIAGNOSIS — R0789 Other chest pain: Secondary | ICD-10-CM | POA: Diagnosis present

## 2022-08-05 LAB — CBC
HCT: 43.1 % (ref 36.0–46.0)
Hemoglobin: 13.4 g/dL (ref 12.0–15.0)
MCH: 23.8 pg — ABNORMAL LOW (ref 26.0–34.0)
MCHC: 31.1 g/dL (ref 30.0–36.0)
MCV: 76.6 fL — ABNORMAL LOW (ref 80.0–100.0)
Platelets: 246 10*3/uL (ref 150–400)
RBC: 5.63 MIL/uL — ABNORMAL HIGH (ref 3.87–5.11)
RDW: 15.6 % — ABNORMAL HIGH (ref 11.5–15.5)
WBC: 8.3 10*3/uL (ref 4.0–10.5)
nRBC: 0 % (ref 0.0–0.2)

## 2022-08-05 LAB — BASIC METABOLIC PANEL
Anion gap: 11 (ref 5–15)
BUN: 20 mg/dL (ref 8–23)
CO2: 26 mmol/L (ref 22–32)
Calcium: 9.8 mg/dL (ref 8.9–10.3)
Chloride: 102 mmol/L (ref 98–111)
Creatinine, Ser: 0.8 mg/dL (ref 0.44–1.00)
GFR, Estimated: >60 mL/min (ref >60)
Glucose, Bld: 131 mg/dL — ABNORMAL HIGH (ref 70–99)
Potassium: 3.3 mmol/L — ABNORMAL LOW (ref 3.5–5.1)
Sodium: 139 mmol/L (ref 135–145)

## 2022-08-05 LAB — TROPONIN I (HIGH SENSITIVITY): Troponin I (High Sensitivity): 7 ng/L (ref <18)

## 2022-08-05 NOTE — ED Triage Notes (Signed)
Pt to triage via w/c with no distress noted; reports she had a fall a wk ago; c/o bilat ribcage pain with no accomp symptoms

## 2022-08-06 ENCOUNTER — Emergency Department
Admission: EM | Admit: 2022-08-06 | Discharge: 2022-08-06 | Disposition: A | Discharge status: Home / Self Care | Payer: 59 | Attending: Emergency Medicine | Admitting: Emergency Medicine

## 2022-08-06 ENCOUNTER — Emergency Department: Payer: 59

## 2022-08-06 DIAGNOSIS — R0789 Other chest pain: Secondary | ICD-10-CM

## 2022-08-06 LAB — TROPONIN I (HIGH SENSITIVITY): Troponin I (High Sensitivity): 11 ng/L (ref <18)

## 2022-08-06 MED ORDER — LIDOCAINE 5 % EX PTCH: 1.0000 | MEDICATED_PATCH | Freq: Two times a day (BID) | CUTANEOUS | 0 refills | Status: DC

## 2022-08-06 MED ORDER — ACETAMINOPHEN 500 MG PO TABS
1000.0000 mg | ORAL_TABLET | Freq: Once | ORAL | Status: AC
  Administered 2022-08-06: 1000 mg via ORAL
  Filled 2022-08-06: qty 2

## 2022-08-06 MED ORDER — LIDOCAINE 5 % EX PTCH
1.0000 | MEDICATED_PATCH | CUTANEOUS | Status: DC
  Administered 2022-08-06: 1 via TRANSDERMAL
  Filled 2022-08-06: qty 1

## 2022-08-06 MED ORDER — KETOROLAC TROMETHAMINE 30 MG/ML IJ SOLN
30.0000 mg | Freq: Once | INTRAMUSCULAR | Status: AC
  Administered 2022-08-06: 30 mg via INTRAMUSCULAR
  Filled 2022-08-06: qty 1

## 2022-08-06 MED ORDER — METHOCARBAMOL 500 MG PO TABS
500.0000 mg | ORAL_TABLET | Freq: Once | ORAL | Status: AC
  Administered 2022-08-06: 500 mg via ORAL
  Filled 2022-08-06: qty 1

## 2022-08-06 NOTE — ED Notes (Signed)
Pt resting quietly at this time. Respirations equal and unlabored, waiting for EDP to update pt.

## 2022-08-06 NOTE — ED Provider Notes (Signed)
[facility] Provider Note    Event Date/Time   First MD Initiated Contact with Patient 08/06/22 0151     (approximate)   History   Chest Pain   HPI  Helen Mccormick is a 82 y.o. female who presents to the ED for evaluation of Chest Pain   Patient presents to the ED for evaluation of 1 week of chest discomfort after a mechanical fall that occurred 1 week ago.  Reports left-sided greater than right bilateral chest discomfort since her fall.  She denies any cough, fever, syncopal episodes or additional injuries.  She has been ambulatory but reports poor sleep due to pain so she presents to the ED for evaluation.   Physical Exam   Triage Vital Signs: ED Triage Vitals  Enc Vitals Group     BP 08/05/22 2306 (!) 167/83     Pulse Rate 08/05/22 2306 92     Resp 08/05/22 2306 17     Temp 08/05/22 2306 98.3 F (36.8 C)     Temp Source 08/05/22 2306 Oral     SpO2 08/05/22 2306 93 %     Weight 08/05/22 2256 168 lb (76.2 kg)     Height 08/05/22 2256 5\' 4"  (1.626 m)     Head Circumference --      Peak Flow --      Pain Score 08/05/22 2256 10     Pain Loc --      Pain Edu? --      Excl. in GC? --     Most recent vital signs: Vitals:   08/05/22 2306 08/06/22 0418  BP: (!) 167/83 (!) 165/75  Pulse: 92 74  Resp: 17 18  Temp: 98.3 F (36.8 C)   SpO2: 93% 94%    General: Awake, no distress.  CV:  Good peripheral perfusion.  Resp:  Normal effort.  Abd:  No distention.  MSK:  No deformity noted.  Mild and poorly localizing bilateral lower chest wall tenderness, really throughout her entire rib cage no overlying signs of trauma, bruising, step-off laceration.   Neuro:  No focal deficits appreciated. Other:     ED Results / Procedures / Treatments   Labs (all labs ordered are listed, but only abnormal results are displayed) Labs Reviewed  CBC - Abnormal; Notable for the following components:      Result Value   RBC 5.63 (*)    MCV 76.6 (*)     MCH 23.8 (*)    RDW 15.6 (*)    All other components within normal limits  BASIC METABOLIC PANEL - Abnormal; Notable for the following components:   Potassium 3.3 (*)    Glucose, Bld 131 (*)    All other components within normal limits  TROPONIN I (HIGH SENSITIVITY)  TROPONIN I (HIGH SENSITIVITY)    EKG Sinus rhythm at 84 bpm.  Normal axis.  Left bundle.  No STEMI by Sgarbossa deterioration  RADIOLOGY CXR interpreted by me without evidence of acute cardiopulmonary pathology.  Official radiology report(s): CT Chest Wo Contrast  Result Date: 08/06/2022 CLINICAL DATA:  Fall.  Evaluate for rib fracture. EXAM: CT CHEST WITHOUT CONTRAST TECHNIQUE: Multidetector CT imaging of the chest was performed following the standard protocol without IV contrast. RADIATION DOSE REDUCTION: This exam was performed according to the departmental dose-optimization program which includes automated exposure control, adjustment of the mA and/or kV according to patient size and/or use of iterative reconstruction technique. COMPARISON:  Chest CT dated 02/21/2021 and  radiograph dated 08/05/2022. FINDINGS: Evaluation of this exam is limited in the absence of intravenous contrast. Cardiovascular: There is no cardiomegaly or pericardial effusion. There is coronary vascular calcification. Moderate atherosclerotic calcification of the thoracic aorta. No aneurysmal dilatation. The central pulmonary arteries are grossly unremarkable on this noncontrast CT. Mediastinum/Nodes: No hilar or mediastinal adenopathy. Small hiatal hernia. The esophagus is grossly unremarkable. No mediastinal fluid collection. Lungs/Pleura: Bibasilar linear atelectasis/scarring. No focal consolidation, pleural effusion, or pneumothorax. The central airways are patent. Upper Abdomen: No acute abnormality. Musculoskeletal: Osteopenia with degenerative changes of the spine. Age indeterminate, likely chronic compression of superior endplate of T12. No acute  osseous pathology. No displaced rib fractures. IMPRESSION: 1. No acute intrathoracic pathology. No displaced rib fractures. 2. Age indeterminate, likely chronic compression of superior endplate of T12. 3.  Aortic Atherosclerosis (ICD10-I70.0). Electronically Signed   By: Elgie Collard M.D.   On: 08/06/2022 02:57   DG Chest 1 View  Result Date: 08/05/2022 CLINICAL DATA:  Fall. EXAM: CHEST  1 VIEW COMPARISON:  Chest radiograph dated 10/03/2021. FINDINGS: The heart size and mediastinal contours are within normal limits. Both lungs are clear. The visualized skeletal structures are unremarkable. IMPRESSION: No active disease. Electronically Signed   By: Elgie Collard M.D.   On: 08/05/2022 23:41    PROCEDURES and INTERVENTIONS:  .1-3 Lead EKG Interpretation  Performed by: Delton Prairie, MD Authorized by: Delton Prairie, MD     Interpretation: normal     ECG rate:  70   ECG rate assessment: normal     Rhythm: sinus rhythm     Ectopy: none     Conduction: normal     Medications  lidocaine (LIDODERM) 5 % 1 patch (1 patch Transdermal Patch Applied 08/06/22 0232)  ketorolac (TORADOL) 30 MG/ML injection 30 mg (30 mg Intramuscular Given 08/06/22 0231)  acetaminophen (TYLENOL) tablet 1,000 mg (1,000 mg Oral Given 08/06/22 0231)  methocarbamol (ROBAXIN) tablet 500 mg (500 mg Oral Given 08/06/22 0231)     IMPRESSION / MDM / ASSESSMENT AND PLAN / ED COURSE  I reviewed the triage vital signs and the nursing notes.  Differential diagnosis includes, but is not limited to, ACS, PTX, PNA, muscle strain/spasm, PE, dissection, anxiety, pleural effusion, rib fx  {Patient presents with symptoms of an acute illness or injury that is potentially life-threatening.  Pleasant 82 year old woman presents with chest discomfort a week out from a fall, possibly muscular in etiology and ultimately suitable for outpatient management.  Reassuring workup with a nonischemic EKG and 2 negative troponins.  CXR is clear but due to  the concern for rib fractures, CT without contrast obtained and is similarly reassuring.  Her symptoms resolved with nonnarcotic multimodal analgesia.  While I considered observation admission for this patient suspect outpatient management is reasonable with the significant improvement of her symptoms and the high likelihood of muscular etiology.  Clinical Course as of 08/06/22 0424  Thu Aug 06, 2022  0414 Reassessed. Feeling much better. No pain. We discussed reassuring workup. [DS]    Clinical Course User Index [DS] Delton Prairie, MD     FINAL CLINICAL IMPRESSION(S) / ED DIAGNOSES   Final diagnoses:  Other chest pain  Chest wall pain     Rx / DC Orders   ED Discharge Orders          Ordered    lidocaine (LIDODERM) 5 %  Every 12 hours        08/06/22 0415  Note:  This document was prepared using Dragon voice recognition software and may include unintentional dictation errors.   Delton Prairie, MD 08/06/22 [phone]

## 2022-08-06 NOTE — ED Notes (Signed)
Patient transported to CT 

## 2022-08-06 NOTE — Discharge Instructions (Addendum)
Use Tylenol for pain and fevers.  Up to 1000 mg per dose, up to 4 times per day.  Do not take more than 4000 mg of Tylenol/acetaminophen within 24 hours.. ° °Please use lidocaine patches at your site of pain.  Apply 1 patch at a time, leave on for 12 hours, then remove for 12 hours.  12 hours on, 12 hours off.  Do not apply more than 1 patch at a time. ° °

## 2022-08-07 ENCOUNTER — Telehealth: Payer: Self-pay

## 2022-08-07 ENCOUNTER — Other Ambulatory Visit: Payer: Self-pay

## 2022-08-07 MED ORDER — ARNUITY ELLIPTA 100 MCG/ACT IN AEPB: 1.0000 | INHALATION_SPRAY | Freq: Every day | RESPIRATORY_TRACT | 6 refills | Status: DC

## 2022-08-07 NOTE — Telephone Encounter (Signed)
Rx request 

## 2022-08-11 ENCOUNTER — Other Ambulatory Visit: Payer: Self-pay

## 2022-08-11 MED ORDER — ARNUITY ELLIPTA 100 MCG/ACT IN AEPB: 1.0000 | INHALATION_SPRAY | Freq: Two times a day (BID) | RESPIRATORY_TRACT | 3 refills | Status: DC

## 2022-08-13 ENCOUNTER — Other Ambulatory Visit: Payer: Self-pay

## 2022-08-14 NOTE — Progress Notes (Signed)
Referring Physician:  [name], MD [address] [place],  [place] [address]  Primary Physician:  [name], MD  History of Present Illness: 08/20/2022 Helen Mccormick has a history of HTN, nonischemic cardiomyopathy, OSA, asthma, gastritis, DM, hyperlipidemia, hypothyroidism, and macular degeneration both eyes.   Last seen by me on 07/16/22 for T12 compression fracture s/p syncopal episode on 07/01/22. She was to continue to wear her TLSO brace.   She is here for follow up and repeat xrays.   She is no better. She has constant mid to lower back pain on the left. No leg pain, but she has left knee pain. No numbness, tingling, or weakness in her legs. Pain is worse with any prolonged positions. She is getting spasms. She states she wears her brace, but does not have it on currently.   Some relief with flexeril. She needs a refill. She has been out of oxycodone.   Conservative measures:  Physical therapy:  denies Multimodal medical therapy including regular antiinflammatories: oxycodone, flexeril, mobic  Injections: denies epidural steroid injections  Past Surgery: denies  Baldwin Crown Tonnesen has no symptoms of cervical myelopathy.  The symptoms are causing a significant impact on the patient's life.   Review of Systems:  A 10 point review of systems is negative, except for the pertinent positives and negatives detailed in the HPI.  Past Medical History: Past Medical History:  Diagnosis Date   Allergy    Anxiety    CHF (congestive heart failure) (HCC)    Colon polyp    Depression    GERD (gastroesophageal reflux disease)    Heart murmur    Hyperlipidemia    Hypertension    Hypothyroidism    Sleep apnea    Thyroid disease     Past Surgical History: Past Surgical History:  Procedure Laterality Date   ABDOMINAL HYSTERECTOMY     BACK SURGERY     BACK SURGERY     CERVICAL FUSION     COLONOSCOPY WITH PROPOFOL N/A 05/28/2017   Procedure: COLONOSCOPY WITH  PROPOFOL;  Surgeon: Wyline Mood, MD;  Location: [facility] ENDOSCOPY;  Service: Gastroenterology;  Laterality: N/A;   COLONOSCOPY WITH PROPOFOL N/A 11/17/2019   Procedure: COLONOSCOPY WITH PROPOFOL;  Surgeon: Regis Bill, MD;  Location: [facility] ENDOSCOPY;  Service: Endoscopy;  Laterality: N/A;   ESOPHAGOGASTRODUODENOSCOPY (EGD) WITH PROPOFOL N/A 11/17/2019   Procedure: ESOPHAGOGASTRODUODENOSCOPY (EGD) WITH PROPOFOL;  Surgeon: Regis Bill, MD;  Location: [facility] ENDOSCOPY;  Service: Endoscopy;  Laterality: N/A;   NECK SURGERY     THYROID SURGERY     THYROID SURGERY      Allergies: Allergies as of 08/20/2022 - Review Complete 08/20/2022  Allergen Reaction Noted   Amlodipine Hives and Swelling 12/24/2014   Tramadol Other (See Comments) 06/14/2019   Isosorbide dinitrate  07/06/2018   Codeine Nausea Only 09/11/2020   Etodolac Other (See Comments) 04/22/2016    Medications: Outpatient Encounter Medications as of 08/20/2022  Medication Sig   acetaminophen (TYLENOL) 650 MG CR tablet Take 650 mg by mouth every 8 (eight) hours as needed for pain.   albuterol (VENTOLIN HFA) 108 (90 Base) MCG/ACT inhaler Inhale 2 puffs into the lungs every 6 (six) hours as needed for wheezing or shortness of breath.   amLODipine (NORVASC) 5 MG tablet TAKE 1 TABLET(5 MG) BY MOUTH DAILY FOR BLOOD PRESSURE   aspirin 81 MG chewable tablet Chew 81 mg by mouth daily.    Azelastine HCl 137 MCG/SPRAY SOLN SMARTSIG:1-2 Spray(s)  Both Nares Twice Daily   b complex vitamins capsule Take 1 capsule by mouth daily.   Blood Glucose Monitoring Suppl (ACCU-CHEK GUIDE) w/Device KIT See admin instructions.   carvedilol (COREG) 12.5 MG tablet Take 1 tablet by mouth daily   cyclobenzaprine (FLEXERIL) 5 MG tablet Take 1 tablet (5 mg total) by mouth 3 (three) times daily as needed for muscle spasms.   empagliflozin (JARDIANCE) 25 MG TABS tablet Take 1 tablet (25 mg total) by mouth daily.   ENTRESTO 24-26 MG Take 1 tablet by mouth 2  (two) times daily.   escitalopram (LEXAPRO) 10 MG tablet Take 1 tablet by mouth daily   FARXIGA 10 MG TABS tablet Take 10 mg by mouth daily.   fluticasone (FLOVENT HFA) 110 MCG/ACT inhaler Inhale 1 puff into the lungs 2 (two) times daily.   Fluticasone Furoate (ARNUITY ELLIPTA) 100 MCG/ACT AEPB Inhale 1 puff into the lungs daily.   Fluticasone Furoate (ARNUITY ELLIPTA) 100 MCG/ACT AEPB Inhale 1 puff into the lungs 2 (two) times daily.   furosemide (LASIX) 20 MG tablet TAKE 1 TABLET BY MOUTH MONDAY WEDNESDAY FRIDAY   gabapentin (NEURONTIN) 300 MG capsule Take 1 capsule (300 mg total) by mouth 3 (three) times daily.   glucose blood test strip Use 1 strip to check your blood sugar two times daily.   isosorbide dinitrate (ISORDIL) 30 MG tablet Take 1 tablet by mouth daily   isosorbide mononitrate (IMDUR) 30 MG 24 hr tablet TAKE 1 TABLET(30 MG) BY MOUTH DAILY (Patient taking differently: Take 30 mg by mouth daily.)   levothyroxine (SYNTHROID) 75 MCG tablet Take 1 tablet by mouth daily   lidocaine (LIDODERM) 5 % Place 1 patch onto the skin every 12 (twelve) hours. Remove & Discard patch within 12 hours or as directed by MD   meclizine (ANTIVERT) 12.5 MG tablet Take 1 tablet (12.5 mg total) by mouth 3 (three) times daily as needed for dizziness.   meloxicam (MOBIC) 15 MG tablet meloxicam 15 mg tablet  Take 1 tablet every day as needed for pain   nitroGLYCERIN (NITROSTAT) 0.4 MG SL tablet Place 1 tablet (0.4 mg total) under the tongue every 5 (five) minutes as needed for chest pain.   polyethylene glycol powder (GLYCOLAX/MIRALAX) 17 GM/SCOOP powder    RESTASIS 0.05 % ophthalmic emulsion Place 1 drop into both eyes 2 (two) times daily.   rosuvastatin (CRESTOR) 40 MG tablet Take 40 mg by mouth daily.   pantoprazole (PROTONIX) 20 MG tablet Take 1 tablet (20 mg total) by mouth daily. FOR ACID REFLUX AND STOMACH PAIN   [DISCONTINUED] albuterol (PROVENTIL HFA;VENTOLIN HFA) 108 (90 Base) MCG/ACT inhaler Inhale  into the lungs every 6 (six) hours as needed for wheezing or shortness of breath.   No facility-administered encounter medications on file as of 08/20/2022.    Social History: Social History   Tobacco Use   Smoking status: Never   Smokeless tobacco: Former    Types: Associate Professor Use: Never used  Substance Use Topics   Alcohol use: No   Drug use: No    Family Medical History: Family History  Problem Relation Age of Onset   Breast cancer Maternal Aunt 39   Diabetes Mother    Leukemia Mother    Heart Problems Mother    Diabetes Sister    Mental illness Sister    Colon polyps Sister    Stroke Father    Cancer Brother  rectal   Healthy Daughter    Hypertension Son    Diabetes Sister    Thyroid disease Sister    Healthy Sister    Healthy Sister    Stroke Brother    Healthy Son    Healthy Son    Healthy Daughter     Physical Examination: Vitals:   08/20/22 1349  BP: 115/60      Awake, alert, oriented to person, place, and time.  Speech is clear and fluent. Fund of knowledge is appropriate.   Cranial Nerves: Pupils equal round and reactive to light.  Facial tone is symmetric.    Mild tenderness TL junction.  Mild right sided lower lumbar tenderness.   No abnormal lesions on exposed skin.   Strength: Side Biceps Triceps Deltoid Interossei Grip Wrist Ext. Wrist Flex.  R 5 5 5 5 5 5 5   L 5 5 5 5 5 5 5    Side Iliopsoas Quads Hamstring PF DF EHL  R 5 5 5 5 5 5   L 5 5 5 5 5 5    Reflexes are 2+ and symmetric at the biceps, triceps, brachioradialis, patella and achilles.   Hoffman's is absent on right. She has positive hoffman's on left.  Clonus is not present.   Bilateral upper and lower extremity sensation is intact to light touch.     Gait is normal.     Medical Decision Making  Imaging: Lumbar xrays dated 08/20/22:  Stable T12 compression fracture. Slip at L3-L4 and L4-L5 with DDD L5-S1.   Radiology report not available for above  xrays.    Assessment and Plan: Helen Mccormick is a pleasant 82 y.o. female has known T12 compression fracture s/p syncopal episode on 07/01/22.   She is no better and pain is severe in left mid to lower back. No leg pain. No numbness, tingling, or weakness. She has not been wearing brace to appointments but states she wears it at home.   Xrays show stable T12 compression fracture.   Treatment options discussed with patient and following plan made:   - MRI of thoracic spine to evaluate T12 compression fracture.  - Depending on MRI results, consider referral to IR for kyphoplasty. She is interested in this.  - Continue with TLSO brace. Do not need to wear to eat, bathe, sleep.  - No bending, twisting, or lifting.  - Continue prn oxycodone.Reviewed dosing and side effects. Given refill.  - Continue with prn flexeril. Reviewed dosing and side effects. Discussed this can cause drowsiness. Given refill.  - Will set up phone visit to review her MRI results once I have them back.   I spent a total of 20 minutes in face-to-face and non-face-to-face activities related to this patient's care today including review of outside records, review of imaging, review of symptoms, physical exam, discussion of differential diagnosis, discussion of treatment options, and documentation.   Drake Leach PA-C Dept. of Neurosurgery

## 2022-08-19 ENCOUNTER — Ambulatory Visit
Admission: RE | Admit: 2022-08-19 | Discharge: 2022-08-19 | Disposition: A | Discharge status: Home / Self Care | Payer: 59 | Source: Ambulatory Visit | Attending: Orthopedic Surgery | Admitting: Orthopedic Surgery

## 2022-08-19 ENCOUNTER — Ambulatory Visit
Admission: RE | Admit: 2022-08-19 | Discharge: 2022-08-19 | Disposition: A | Discharge status: Home / Self Care | Payer: 59 | Attending: Orthopedic Surgery | Admitting: Orthopedic Surgery

## 2022-08-19 DIAGNOSIS — S22080D Wedge compression fracture of T11-T12 vertebra, subsequent encounter for fracture with routine healing: Secondary | ICD-10-CM

## 2022-08-20 ENCOUNTER — Other Ambulatory Visit: Payer: Self-pay

## 2022-08-20 ENCOUNTER — Ambulatory Visit (INDEPENDENT_AMBULATORY_CARE_PROVIDER_SITE_OTHER): Payer: 59 | Admitting: Orthopedic Surgery

## 2022-08-20 ENCOUNTER — Encounter: Payer: Self-pay | Admitting: Orthopedic Surgery

## 2022-08-20 VITALS — BP 115/60 | Ht 64.0 in | Wt 168.0 lb

## 2022-08-20 DIAGNOSIS — S22080D Wedge compression fracture of T11-T12 vertebra, subsequent encounter for fracture with routine healing: Secondary | ICD-10-CM | POA: Diagnosis not present

## 2022-08-20 MED ORDER — CYCLOBENZAPRINE HCL 5 MG PO TABS
5.0000 mg | ORAL_TABLET | Freq: Three times a day (TID) | ORAL | 0 refills | Status: DC | PRN
Start: 2022-08-20 — End: 2022-12-01

## 2022-08-20 MED ORDER — OXYCODONE HCL 5 MG PO TABS
5.0000 mg | ORAL_TABLET | Freq: Three times a day (TID) | ORAL | 0 refills | Status: DC | PRN
Start: 2022-08-20 — End: 2022-12-01

## 2022-08-20 MED ORDER — ALBUTEROL SULFATE HFA 108 (90 BASE) MCG/ACT IN AERS: 2.0000 | INHALATION_SPRAY | Freq: Four times a day (QID) | RESPIRATORY_TRACT | 6 refills | Status: DC | PRN

## 2022-08-20 NOTE — Patient Instructions (Signed)
It was so nice to see you today. Thank you so much for coming in.    You have a compression fracture at T12. Xrays from today show it is stable.   Continue to wear your brace. You can take off to eat and sleep. You can remove if you are sitting and watching TV.   No bending, twisting, or lifting.   I want to get an MRI of your back to see if you are a candidate for a kyphoplasty procedure (inject cement into the bone). I put the orders in and [facility] will call you to schedule this.   Continue on oxycodone only as needed to help with severe pain. It can make you sleepy and/or constipated. I sent a refill to your pharmacy.   Continue on cyclobenzaprine as needed to help with muscle spasms. Use only as needed and be careful, this can make you sleepy.   Once I have your MRI results back, we will call you to schedule a phone follow up with me.   Drake Leach PA-C [phone]

## 2022-08-23 ENCOUNTER — Other Ambulatory Visit: Payer: Self-pay | Admitting: Cardiovascular Disease

## 2022-08-23 DIAGNOSIS — R079 Chest pain, unspecified: Secondary | ICD-10-CM

## 2022-08-28 ENCOUNTER — Ambulatory Visit
Admission: RE | Admit: 2022-08-28 | Discharge: 2022-08-28 | Disposition: A | Discharge status: Home / Self Care | Payer: 59 | Source: Ambulatory Visit | Attending: Orthopedic Surgery | Admitting: Orthopedic Surgery

## 2022-08-28 ENCOUNTER — Telehealth: Payer: Self-pay | Admitting: Internal Medicine

## 2022-08-28 DIAGNOSIS — S22080D Wedge compression fracture of T11-T12 vertebra, subsequent encounter for fracture with routine healing: Secondary | ICD-10-CM | POA: Diagnosis present

## 2022-08-28 NOTE — Telephone Encounter (Signed)
Mindy with [employer] called to clarify if patient should still be taking Flovent HFA 110? Please advise.  [phone] reference # [id number]

## 2022-09-01 ENCOUNTER — Ambulatory Visit (INDEPENDENT_AMBULATORY_CARE_PROVIDER_SITE_OTHER): Payer: Medicaid Other | Admitting: Internal Medicine

## 2022-09-01 ENCOUNTER — Encounter: Payer: Self-pay | Admitting: Internal Medicine

## 2022-09-01 VITALS — BP 128/70 | HR 65 | Ht 64.0 in | Wt 168.0 lb

## 2022-09-01 DIAGNOSIS — D509 Iron deficiency anemia, unspecified: Secondary | ICD-10-CM

## 2022-09-01 DIAGNOSIS — I10 Essential (primary) hypertension: Secondary | ICD-10-CM

## 2022-09-01 DIAGNOSIS — I428 Other cardiomyopathies: Secondary | ICD-10-CM

## 2022-09-01 DIAGNOSIS — E890 Postprocedural hypothyroidism: Secondary | ICD-10-CM | POA: Diagnosis not present

## 2022-09-01 DIAGNOSIS — E782 Mixed hyperlipidemia: Secondary | ICD-10-CM

## 2022-09-01 DIAGNOSIS — E1165 Type 2 diabetes mellitus with hyperglycemia: Secondary | ICD-10-CM

## 2022-09-01 LAB — POCT CBG (FASTING - GLUCOSE)-MANUAL ENTRY: Glucose Fasting, POC: 137 mg/dL — AB (ref 70–99)

## 2022-09-01 NOTE — Progress Notes (Signed)
Established Patient Office Visit  Subjective:  Patient ID: Helen Mccormick, female    DOB: [birth year]  Age: 82 y.o. MRN: [id number]  Chief Complaint  Patient presents with   Follow-up    3 month follow up    Patient comes in for follow-up with her daughter.  She is still wearing a back brace because of her T12 compression fracture, and she is in a lot of pain . Patient had an MRI done, results not available.  She is currently taking all her medications as prescribed.  She is fasting for blood work.    No other concerns at this time.   Past Medical History:  Diagnosis Date   Allergy    Anxiety    CHF (congestive heart failure) (HCC)    Colon polyp    Depression    GERD (gastroesophageal reflux disease)    Heart murmur    Hyperlipidemia    Hypertension    Hypothyroidism    Sleep apnea    Thyroid disease     Past Surgical History:  Procedure Laterality Date   ABDOMINAL HYSTERECTOMY     BACK SURGERY     BACK SURGERY     CERVICAL FUSION     COLONOSCOPY WITH PROPOFOL N/A 05/28/2017   Procedure: COLONOSCOPY WITH PROPOFOL;  Surgeon: Wyline Mood, MD;  Location: [facility] ENDOSCOPY;  Service: Gastroenterology;  Laterality: N/A;   COLONOSCOPY WITH PROPOFOL N/A 11/17/2019   Procedure: COLONOSCOPY WITH PROPOFOL;  Surgeon: Regis Bill, MD;  Location: [facility] ENDOSCOPY;  Service: Endoscopy;  Laterality: N/A;   ESOPHAGOGASTRODUODENOSCOPY (EGD) WITH PROPOFOL N/A 11/17/2019   Procedure: ESOPHAGOGASTRODUODENOSCOPY (EGD) WITH PROPOFOL;  Surgeon: Regis Bill, MD;  Location: [facility] ENDOSCOPY;  Service: Endoscopy;  Laterality: N/A;   NECK SURGERY     THYROID SURGERY     THYROID SURGERY      Social History   Socioeconomic History   Marital status: Single    Spouse name: Not on file   Number of children: Not on file   Years of education: Not on file   Highest education level: 10th grade  Occupational History   Occupation: retired  Tobacco Use   Smoking status: Never    Smokeless tobacco: Former    Types: Associate Professor Use: Never used  Substance and Sexual Activity   Alcohol use: No   Drug use: No   Sexual activity: Not Currently  Other Topics Concern   Not on file  Social History Narrative   Not on file   Social Determinants of Health   Financial Resource Strain: Low Risk  (04/27/2017)   Overall Financial Resource Strain (CARDIA)    Difficulty of Paying Living Expenses: Not hard at all  Food Insecurity: No Food Insecurity (04/27/2017)   Hunger Vital Sign    Worried About Running Out of Food in the Last Year: Never true    Ran Out of Food in the Last Year: Never true  Transportation Needs: No Transportation Needs (04/27/2017)   PRAPARE - Administrator, Civil Service (Medical): No    Lack of Transportation (Non-Medical): No  Physical Activity: Sufficiently Active (12/20/2018)   Exercise Vital Sign    Days of Exercise per Week: 4 days    Minutes of Exercise per Session: 60 min  Stress: No Stress Concern Present (03/12/2017)   [employer] of Occupational Health - Occupational Stress Questionnaire    Feeling of Stress : Only a little  Social Connections:  Moderately Integrated (04/27/2017)   Social Connection and Isolation Panel [NHANES]    Frequency of Communication with Friends and Family: More than three times a week    Frequency of Social Gatherings with Friends and Family: Once a week    Attends Religious Services: More than 4 times per year    Active Member of [employer] or Organizations: Yes    Attends Banker Meetings: More than 4 times per year    Marital Status: Never married  Intimate Partner Violence: Unknown (03/12/2017)   Humiliation, Afraid, Rape, and Kick questionnaire    Fear of Current or Ex-Partner: Patient declined    Emotionally Abused: Patient declined    Physically Abused: Patient declined    Sexually Abused: Patient declined    Family History  Problem Relation Age of Onset    Breast cancer Maternal Aunt 65   Diabetes Mother    Leukemia Mother    Heart Problems Mother    Diabetes Sister    Mental illness Sister    Colon polyps Sister    Stroke Father    Cancer Brother        rectal   Healthy Daughter    Hypertension Son    Diabetes Sister    Thyroid disease Sister    Healthy Sister    Healthy Sister    Stroke Brother    Healthy Son    Healthy Son    Healthy Daughter     Allergies  Allergen Reactions   Amlodipine Hives and Swelling   Tramadol Other (See Comments)    Dizziness    Isosorbide Dinitrate     Other Reaction(s): Headache   Codeine Nausea Only   Etodolac Other (See Comments)    Other reaction(s): Abdominal Pain    Review of Systems  Constitutional:  Positive for malaise/fatigue. Negative for chills, diaphoresis, fever and weight loss.  HENT:  Negative for congestion, ear discharge, ear pain, hearing loss, nosebleeds, sore throat and tinnitus.   Eyes: Negative.   Respiratory:  Negative for cough, shortness of breath, wheezing and stridor.   Cardiovascular:  Negative for chest pain, palpitations, orthopnea, claudication, leg swelling and PND.  Gastrointestinal:  Negative for abdominal pain, blood in stool, diarrhea, heartburn, melena, nausea and vomiting.  Genitourinary:  Negative for dysuria, frequency and urgency.  Musculoskeletal:  Positive for back pain, joint pain and myalgias.  Skin: Negative.   Neurological:  Negative for dizziness, tingling, sensory change, speech change, focal weakness, seizures, loss of consciousness, weakness and headaches.  Psychiatric/Behavioral:  Negative for depression and memory loss. The patient is not nervous/anxious.        Objective:   BP 128/70   Pulse 65   Ht 5\' 4"  (1.626 m)   Wt 168 lb (76.2 kg)   LMP  (LMP Unknown)   SpO2 95%   BMI 28.84 kg/m   Vitals:   09/01/22 0959  BP: 128/70  Pulse: 65  Height: 5\' 4"  (1.626 m)  Weight: 168 lb (76.2 kg)  SpO2: 95%  BMI (Calculated):  28.82    Physical Exam Vitals and nursing note reviewed.  Constitutional:      Appearance: Normal appearance.  HENT:     Nose: Nose normal.     Mouth/Throat:     Mouth: Mucous membranes are moist.  Eyes:     Conjunctiva/sclera: Conjunctivae normal.  Neck:     Vascular: No carotid bruit.  Cardiovascular:     Rate and Rhythm: Normal rate and regular rhythm.  Pulses: Normal pulses.     Heart sounds: Normal heart sounds. No murmur heard. Pulmonary:     Effort: Pulmonary effort is normal.     Breath sounds: Normal breath sounds. No wheezing or rales.  Abdominal:     General: Bowel sounds are normal. There is no distension.     Tenderness: There is no right CVA tenderness, left CVA tenderness, guarding or rebound.  Musculoskeletal:        General: Normal range of motion.     Cervical back: Normal range of motion and neck supple.     Right lower leg: No edema.     Left lower leg: No edema.  Lymphadenopathy:     Cervical: No cervical adenopathy.  Skin:    General: Skin is warm and dry.  Neurological:     General: No focal deficit present.     Mental Status: She is alert and oriented to person, place, and time.  Psychiatric:        Mood and Affect: Mood normal.        Behavior: Behavior normal.      Results for orders placed or performed in visit on 09/01/22  POCT CBG (Fasting - Glucose)  Result Value Ref Range   Glucose Fasting, POC 137 (A) 70 - 99 mg/dL    Recent Results (from the past 2160 hour(s))  Hepatic function panel     Status: Abnormal   Collection Time: 07/01/22  2:15 PM  Result Value Ref Range   Total Protein 6.4 (L) 6.5 - 8.1 g/dL   Albumin 3.5 3.5 - 5.0 g/dL   AST 31 15 - 41 U/L   ALT 28 0 - 44 U/L   Alkaline Phosphatase 68 38 - 126 U/L   Total Bilirubin 0.7 0.3 - 1.2 mg/dL   Bilirubin, Direct <4.0 0.0 - 0.2 mg/dL   Indirect Bilirubin NOT CALCULATED 0.3 - 0.9 mg/dL    Comment: Performed at [facility], [address] [place],  [place] [address]  Lipase, blood     Status: None   Collection Time: 07/01/22  2:15 PM  Result Value Ref Range   Lipase 23 11 - 51 U/L    Comment: Performed at [facility], [address], [place], [place] [address]  Basic metabolic panel     Status: Abnormal   Collection Time: 07/01/22  2:16 PM  Result Value Ref Range   Sodium 140 135 - 145 mmol/L   Potassium 3.8 3.5 - 5.1 mmol/L   Chloride 107 98 - 111 mmol/L   CO2 24 22 - 32 mmol/L   Glucose, Bld 136 (H) 70 - 99 mg/dL    Comment: Glucose reference range applies only to samples taken after fasting for at least 8 hours.   BUN 20 8 - 23 mg/dL   Creatinine, Ser 9.56 0.44 - 1.00 mg/dL   Calcium 8.7 (L) 8.9 - 10.3 mg/dL   GFR, Estimated >21 >30 mL/min    Comment: (NOTE) Calculated using the CKD-EPI Creatinine Equation (2021)    Anion gap 9 5 - 15    Comment: Performed at [facility], [address] [place], [place] [address]  CBC     Status: Abnormal   Collection Time: 07/01/22  2:16 PM  Result Value Ref Range   WBC 5.1 4.0 - 10.5 K/uL   RBC 4.54 3.87 - 5.11 MIL/uL   Hemoglobin 11.1 (L) 12.0 - 15.0 g/dL   HCT 46.9 (L) 62.9 - 52.8 %  MCV 79.1 (L) 80.0 - 100.0 fL   MCH 24.4 (L) 26.0 - 34.0 pg   MCHC 30.9 30.0 - 36.0 g/dL   RDW 16.1 09.6 - 04.5 %   Platelets 179 150 - 400 K/uL   nRBC 0.0 0.0 - 0.2 %    Comment: Performed at [facility], [address] [place], [place] [address]  CBG monitoring, ED     Status: Abnormal   Collection Time: 07/01/22  6:48 PM  Result Value Ref Range   Glucose-Capillary 183 (H) 70 - 99 mg/dL    Comment: Glucose reference range applies only to samples taken after fasting for at least 8 hours.  Troponin I (High Sensitivity)     Status: None   Collection Time: 07/01/22  6:54 PM  Result Value Ref Range   Troponin I (High Sensitivity) 5 <18 ng/L    Comment: (NOTE) Elevated high sensitivity troponin I (hsTnI) values and significant  changes across serial measurements may  suggest ACS but many other  chronic and acute conditions are known to elevate hsTnI results.  Refer to the "Links" section for chest pain algorithms and additional  guidance. Performed at [facility], [address] [place], [place] [address]   Magnesium     Status: None   Collection Time: 07/01/22  7:00 PM  Result Value Ref Range   Magnesium 2.2 1.7 - 2.4 mg/dL    Comment: Performed at [facility], [address] [place], [place] [address]  Urinalysis, Routine w reflex microscopic -Urine, Clean Catch     Status: Abnormal   Collection Time: 07/01/22  8:30 PM  Result Value Ref Range   Color, Urine STRAW (A) YELLOW   APPearance CLEAR (A) CLEAR   Specific Gravity, Urine 1.037 (H) 1.005 - 1.030   pH 5.0 5.0 - 8.0   Glucose, UA >=500 (A) NEGATIVE mg/dL   Hgb urine dipstick NEGATIVE NEGATIVE   Bilirubin Urine NEGATIVE NEGATIVE   Ketones, ur NEGATIVE NEGATIVE mg/dL   Protein, ur NEGATIVE NEGATIVE mg/dL   Nitrite NEGATIVE NEGATIVE   Leukocytes,Ua NEGATIVE NEGATIVE   RBC / HPF 0-5 0 - 5 RBC/hpf   WBC, UA 0-5 0 - 5 WBC/hpf   Bacteria, UA NONE SEEN NONE SEEN   Squamous Epithelial / HPF 0-5 0 - 5 /HPF    Comment: Performed at [facility], [address] [place], [place] [address]  Troponin I (High Sensitivity)     Status: None   Collection Time: 07/02/22 12:10 AM  Result Value Ref Range   Troponin I (High Sensitivity) 6 <18 ng/L    Comment: (NOTE) Elevated high sensitivity troponin I (hsTnI) values and significant  changes across serial measurements may suggest ACS but many other  chronic and acute conditions are known to elevate hsTnI results.  Refer to the "Links" section for chest pain algorithms and additional  guidance. Performed at [facility], [address] [place], [place] [address]   CBG monitoring, ED     Status: Abnormal   Collection Time: 07/02/22  4:36 AM  Result Value Ref Range   Glucose-Capillary 108 (H) 70 - 99 mg/dL     Comment: Glucose reference range applies only to samples taken after fasting for at least 8 hours.   Comment 1 Document in Chart   Basic metabolic panel     Status: Abnormal   Collection Time: 07/02/22  4:41 AM  Result Value Ref Range   Sodium 141 135 - 145 mmol/L  Potassium 3.4 (L) 3.5 - 5.1 mmol/L   Chloride 109 98 - 111 mmol/L   CO2 25 22 - 32 mmol/L   Glucose, Bld 99 70 - 99 mg/dL    Comment: Glucose reference range applies only to samples taken after fasting for at least 8 hours.   BUN 18 8 - 23 mg/dL   Creatinine, Ser 1.61 0.44 - 1.00 mg/dL   Calcium 9.2 8.9 - 09.6 mg/dL   GFR, Estimated >04 >54 mL/min    Comment: (NOTE) Calculated using the CKD-EPI Creatinine Equation (2021)    Anion gap 7 5 - 15    Comment: Performed at [facility], [address] [place], [place] [address]  CBC     Status: Abnormal   Collection Time: 07/02/22  4:41 AM  Result Value Ref Range   WBC 6.5 4.0 - 10.5 K/uL   RBC 4.96 3.87 - 5.11 MIL/uL   Hemoglobin 12.2 12.0 - 15.0 g/dL   HCT 91.4 78.2 - 95.6 %   MCV 77.2 (L) 80.0 - 100.0 fL   MCH 24.6 (L) 26.0 - 34.0 pg   MCHC 31.9 30.0 - 36.0 g/dL   RDW 21.3 08.6 - 57.8 %   Platelets 183 150 - 400 K/uL   nRBC 0.0 0.0 - 0.2 %    Comment: Performed at [facility], [address], [place], [place] [address]  Troponin I (High Sensitivity)     Status: None   Collection Time: 07/02/22  4:41 AM  Result Value Ref Range   Troponin I (High Sensitivity) 9 <18 ng/L    Comment: (NOTE) Elevated high sensitivity troponin I (hsTnI) values and significant  changes across serial measurements may suggest ACS but many other  chronic and acute conditions are known to elevate hsTnI results.  Refer to the "Links" section for chest pain algorithms and additional  guidance. Performed at [facility], [address] [place], [place] [address]   Troponin I (High Sensitivity)     Status: None   Collection Time: 07/02/22  6:36 AM   Result Value Ref Range   Troponin I (High Sensitivity) 9 <18 ng/L    Comment: (NOTE) Elevated high sensitivity troponin I (hsTnI) values and significant  changes across serial measurements may suggest ACS but many other  chronic and acute conditions are known to elevate hsTnI results.  Refer to the "Links" section for chest pain algorithms and additional  guidance. Performed at [facility], [address] [place], [place] [address]   CBG monitoring, ED     Status: Abnormal   Collection Time: 07/02/22  9:04 AM  Result Value Ref Range   Glucose-Capillary 123 (H) 70 - 99 mg/dL    Comment: Glucose reference range applies only to samples taken after fasting for at least 8 hours.  ECHOCARDIOGRAM COMPLETE     Status: None   Collection Time: 07/02/22 10:07 AM  Result Value Ref Range   Weight 2,592 oz   Height 66 in   BP 176/85 mmHg   Ao pk vel 1.20 m/s   AV Area VTI 2.95 cm2   AR max vel 2.48 cm2   AV Mean grad 3.0 mmHg   AV Peak grad 5.8 mmHg   S' Lateral 3.30 cm   AV Area mean vel 2.86 cm2   Area-P 1/2 2.99 cm2   MV VTI 1.86 cm2   Est EF 55 - 60%   CBG monitoring, ED     Status: None  Collection Time: 07/02/22 12:02 PM  Result Value Ref Range   Glucose-Capillary 94 70 - 99 mg/dL    Comment: Glucose reference range applies only to samples taken after fasting for at least 8 hours.  Basic metabolic panel     Status: Abnormal   Collection Time: 07/08/22  3:12 PM  Result Value Ref Range   Sodium 142 135 - 145 mmol/L   Potassium 3.7 3.5 - 5.1 mmol/L   Chloride 106 98 - 111 mmol/L   CO2 26 22 - 32 mmol/L   Glucose, Bld 157 (H) 70 - 99 mg/dL    Comment: Glucose reference range applies only to samples taken after fasting for at least 8 hours.   BUN 20 8 - 23 mg/dL   Creatinine, Ser 1.61 0.44 - 1.00 mg/dL   Calcium 9.1 8.9 - 09.6 mg/dL   GFR, Estimated >04 >54 mL/min    Comment: (NOTE) Calculated using the CKD-EPI Creatinine Equation (2021)    Anion gap 10 5 - 15     Comment: Performed at [facility], [address] [place], [place] [address]  CBC     Status: Abnormal   Collection Time: 07/08/22  3:12 PM  Result Value Ref Range   WBC 6.6 4.0 - 10.5 K/uL   RBC 5.10 3.87 - 5.11 MIL/uL   Hemoglobin 12.4 12.0 - 15.0 g/dL   HCT 91.4 78.2 - 95.6 %   MCV 77.5 (L) 80.0 - 100.0 fL   MCH 24.3 (L) 26.0 - 34.0 pg   MCHC 31.4 30.0 - 36.0 g/dL   RDW 21.3 08.6 - 57.8 %   Platelets 184 150 - 400 K/uL   nRBC 0.0 0.0 - 0.2 %    Comment: Performed at [facility], [address] [place], [place] [address]  POCT CBG (Fasting - Glucose)     Status: Abnormal   Collection Time: 07/13/22  1:41 PM  Result Value Ref Range   Glucose Fasting, POC 131 (A) 70 - 99 mg/dL  POCT CBG (Fasting - Glucose)     Status: Abnormal   Collection Time: 07/27/22 10:02 AM  Result Value Ref Range   Glucose Fasting, POC 124 (A) 70 - 99 mg/dL  Basic metabolic panel     Status: Abnormal   Collection Time: 07/29/22  5:19 PM  Result Value Ref Range   Sodium 139 135 - 145 mmol/L   Potassium 3.4 (L) 3.5 - 5.1 mmol/L   Chloride 104 98 - 111 mmol/L   CO2 24 22 - 32 mmol/L   Glucose, Bld 158 (H) 70 - 99 mg/dL    Comment: Glucose reference range applies only to samples taken after fasting for at least 8 hours.   BUN 25 (H) 8 - 23 mg/dL   Creatinine, Ser 9.52 0.44 - 1.00 mg/dL   Calcium 8.7 (L) 8.9 - 10.3 mg/dL   GFR, Estimated >84 >13 mL/min    Comment: (NOTE) Calculated using the CKD-EPI Creatinine Equation (2021)    Anion gap 11 5 - 15    Comment: Performed at [facility], [address] [place], [place] [address]  CBC     Status: Abnormal   Collection Time: 07/29/22  5:19 PM  Result Value Ref Range   WBC 5.6 4.0 - 10.5 K/uL   RBC 4.61 3.87 - 5.11 MIL/uL   Hemoglobin 11.2 (L) 12.0 - 15.0 g/dL   HCT 02.7 (L) 25.3 - 66.4 %   MCV 77.4 (L) 80.0 - 100.0  fL   MCH 24.3 (L) 26.0 - 34.0 pg   MCHC 31.4 30.0 - 36.0 g/dL   RDW 16.1 (H) 09.6 - 04.5 %    Platelets 197 150 - 400 K/uL   nRBC 0.0 0.0 - 0.2 %    Comment: Performed at [facility] [address] [place], [place] [address]  Troponin I (High Sensitivity)     Status: None   Collection Time: 07/29/22  5:19 PM  Result Value Ref Range   Troponin I (High Sensitivity) 5 <18 ng/L    Comment: (NOTE) Elevated high sensitivity troponin I (hsTnI) values and significant  changes across serial measurements may suggest ACS but many other  chronic and acute conditions are known to elevate hsTnI results.  Refer to the "Links" section for chest pain algorithms and additional  guidance. Performed at [facility], [address] [place], [place] [address]   Urinalysis, Routine w reflex microscopic -Urine, Clean Catch     Status: Abnormal   Collection Time: 07/29/22  8:36 PM  Result Value Ref Range   Color, Urine STRAW (A) YELLOW   APPearance CLEAR (A) CLEAR   Specific Gravity, Urine 1.028 1.005 - 1.030   pH 5.0 5.0 - 8.0   Glucose, UA >=500 (A) NEGATIVE mg/dL   Hgb urine dipstick NEGATIVE NEGATIVE   Bilirubin Urine NEGATIVE NEGATIVE   Ketones, ur NEGATIVE NEGATIVE mg/dL   Protein, ur NEGATIVE NEGATIVE mg/dL   Nitrite NEGATIVE NEGATIVE   Leukocytes,Ua NEGATIVE NEGATIVE   RBC / HPF 0-5 0 - 5 RBC/hpf   WBC, UA 0-5 0 - 5 WBC/hpf   Bacteria, UA NONE SEEN NONE SEEN   Squamous Epithelial / HPF 0-5 0 - 5 /HPF   Mucus PRESENT     Comment: Performed at [facility], [address], [place], [place] [address]  Troponin I (High Sensitivity)     Status: None   Collection Time: 07/29/22  8:36 PM  Result Value Ref Range   Troponin I (High Sensitivity) 6 <18 ng/L    Comment: (NOTE) Elevated high sensitivity troponin I (hsTnI) values and significant  changes across serial measurements may suggest ACS but many other  chronic and acute conditions are known to elevate hsTnI results.  Refer to the "Links" section for chest pain algorithms and additional   guidance. Performed at [facility], [address] [place], [place] [address]   CBC     Status: Abnormal   Collection Time: 08/05/22 11:08 PM  Result Value Ref Range   WBC 8.3 4.0 - 10.5 K/uL   RBC 5.63 (H) 3.87 - 5.11 MIL/uL   Hemoglobin 13.4 12.0 - 15.0 g/dL   HCT 65.7 84.6 - 96.2 %   MCV 76.6 (L) 80.0 - 100.0 fL   MCH 23.8 (L) 26.0 - 34.0 pg   MCHC 31.1 30.0 - 36.0 g/dL   RDW 95.2 (H) 84.1 - 32.4 %   Platelets 246 150 - 400 K/uL   nRBC 0.0 0.0 - 0.2 %    Comment: Performed at [facility], [address], [place], [place] [address]  Basic metabolic panel     Status: Abnormal   Collection Time: 08/05/22 11:08 PM  Result Value Ref Range   Sodium 139 135 - 145 mmol/L   Potassium 3.3 (L) 3.5 - 5.1 mmol/L   Chloride 102 98 - 111 mmol/L   CO2 26 22 - 32 mmol/L   Glucose, Bld 131 (H) 70 - 99 mg/dL  Comment: Glucose reference range applies only to samples taken after fasting for at least 8 hours.   BUN 20 8 - 23 mg/dL   Creatinine, Ser 1.61 0.44 - 1.00 mg/dL   Calcium 9.8 8.9 - 09.6 mg/dL   GFR, Estimated >04 >54 mL/min    Comment: (NOTE) Calculated using the CKD-EPI Creatinine Equation (2021)    Anion gap 11 5 - 15    Comment: Performed at [facility], [address] [place], [place] [address]  Troponin I (High Sensitivity)     Status: None   Collection Time: 08/05/22 11:08 PM  Result Value Ref Range   Troponin I (High Sensitivity) 7 <18 ng/L    Comment: (NOTE) Elevated high sensitivity troponin I (hsTnI) values and significant  changes across serial measurements may suggest ACS but many other  chronic and acute conditions are known to elevate hsTnI results.  Refer to the "Links" section for chest pain algorithms and additional  guidance. Performed at [facility], [address] [place], [place] [address]   Troponin I (High Sensitivity)     Status: None   Collection Time: 08/06/22  2:49 AM  Result Value Ref Range    Troponin I (High Sensitivity) 11 <18 ng/L    Comment: (NOTE) Elevated high sensitivity troponin I (hsTnI) values and significant  changes across serial measurements may suggest ACS but many other  chronic and acute conditions are known to elevate hsTnI results.  Refer to the "Links" section for chest pain algorithms and additional  guidance. Performed at [facility], [address] [place], [place] [address]   POCT CBG (Fasting - Glucose)     Status: Abnormal   Collection Time: 09/01/22 10:04 AM  Result Value Ref Range   Glucose Fasting, POC 137 (A) 70 - 99 mg/dL      Assessment & Plan:  Patient advised to continue taking all her medications.  Fasting blood work today. Problem List Items Addressed This Visit     DM (diabetes mellitus), type 2 (HCC) - Primary   Relevant Orders   POCT CBG (Fasting - Glucose) (Completed)   Hemoglobin A1c   Hypothyroidism, postsurgical   Relevant Orders   TSH   Microcytic anemia   Relevant Orders   CBC With Differential   Nonischemic cardiomyopathy (HCC)   Mixed hyperlipidemia   Relevant Orders   Lipid Panel w/o Chol/HDL Ratio   Other Visit Diagnoses     Essential hypertension, benign       Relevant Orders   CMP14+EGFR       Return in about 2 months (around 11/01/2022).   Total time spent: 30 minutes  [name], MD  09/01/2022   This document may have been prepared by [facility] Voice Recognition software and as such may include unintentional dictation errors.

## 2022-09-02 ENCOUNTER — Encounter: Payer: Self-pay | Admitting: Internal Medicine

## 2022-09-02 LAB — HEMOGLOBIN A1C
Est. average glucose Bld gHb Est-mCnc: 154 mg/dL
Hgb A1c MFr Bld: 7 % — ABNORMAL HIGH (ref 4.8–5.6)

## 2022-09-02 LAB — CMP14+EGFR
ALT: 19 IU/L (ref 0–32)
AST: 22 IU/L (ref 0–40)
Albumin/Globulin Ratio: 1.7 (ref 1.2–2.2)
Albumin: 4.4 g/dL (ref 3.8–4.8)
Alkaline Phosphatase: 107 IU/L (ref 44–121)
BUN/Creatinine Ratio: 28 (ref 12–28)
BUN: 27 mg/dL (ref 8–27)
Bilirubin Total: 0.3 mg/dL (ref 0.0–1.2)
CO2: 24 mmol/L (ref 20–29)
Calcium: 9.6 mg/dL (ref 8.7–10.3)
Chloride: 107 mmol/L — ABNORMAL HIGH (ref 96–106)
Creatinine, Ser: 0.98 mg/dL (ref 0.57–1.00)
Globulin, Total: 2.6 g/dL (ref 1.5–4.5)
Glucose: 129 mg/dL — ABNORMAL HIGH (ref 70–99)
Potassium: 4.4 mmol/L (ref 3.5–5.2)
Sodium: 143 mmol/L (ref 134–144)
Total Protein: 7 g/dL (ref 6.0–8.5)
eGFR: 59 mL/min/{1.73_m2} — ABNORMAL LOW (ref >59)

## 2022-09-02 LAB — CBC WITH DIFFERENTIAL
Basophils Absolute: 0 10*3/uL (ref 0.0–0.2)
Basos: 0 %
EOS (ABSOLUTE): 0.2 10*3/uL (ref 0.0–0.4)
Eos: 4 %
Hematocrit: 37.6 % (ref 34.0–46.6)
Hemoglobin: 11.8 g/dL (ref 11.1–15.9)
Immature Grans (Abs): 0 10*3/uL (ref 0.0–0.1)
Immature Granulocytes: 0 %
Lymphocytes Absolute: 2.6 10*3/uL (ref 0.7–3.1)
Lymphs: 50 %
MCH: 23.5 pg — ABNORMAL LOW (ref 26.6–33.0)
MCHC: 31.4 g/dL — ABNORMAL LOW (ref 31.5–35.7)
MCV: 75 fL — ABNORMAL LOW (ref 79–97)
Monocytes Absolute: 0.4 10*3/uL (ref 0.1–0.9)
Monocytes: 7 %
Neutrophils Absolute: 2 10*3/uL (ref 1.4–7.0)
Neutrophils: 39 %
RBC: 5.02 x10E6/uL (ref 3.77–5.28)
RDW: 15.7 % — ABNORMAL HIGH (ref 11.7–15.4)
WBC: 5.2 10*3/uL (ref 3.4–10.8)

## 2022-09-02 LAB — LIPID PANEL W/O CHOL/HDL RATIO
Cholesterol, Total: 126 mg/dL (ref 100–199)
HDL: 40 mg/dL (ref >39)
LDL Chol Calc (NIH): 71 mg/dL (ref 0–99)
Triglycerides: 77 mg/dL (ref 0–149)
VLDL Cholesterol Cal: 15 mg/dL (ref 5–40)

## 2022-09-02 LAB — TSH: TSH: 0.314 u[IU]/mL — ABNORMAL LOW (ref 0.450–4.500)

## 2022-09-04 ENCOUNTER — Ambulatory Visit: Payer: Medicaid Other | Admitting: Internal Medicine

## 2022-09-08 NOTE — Progress Notes (Signed)
Patient notified

## 2022-09-09 ENCOUNTER — Encounter: Payer: Self-pay | Admitting: Orthopedic Surgery

## 2022-09-09 DIAGNOSIS — S22080D Wedge compression fracture of T11-T12 vertebra, subsequent encounter for fracture with routine healing: Secondary | ICD-10-CM

## 2022-09-09 NOTE — Telephone Encounter (Signed)
MRI of thoracic spine dated 08/28/22:  FINDINGS: Alignment:  Normal.   Vertebrae: There is a mild superior endplate compression fracture of T12 which is new since the prior MRI. Vertebral body height loss is estimated at up to 15%. There is mild marrow edema within the vertebral body. Edema is also seen in the T11 and T12 pedicles most consistent with stress change. No pedicle fracture is identified. Degenerative endplate signal change T6-7 and T7 a is stable in appearance.   Cord:  Normal signal throughout.   Paraspinal and other soft tissues: Negative.   Disc levels:   Degenerative disease is unchanged in appearance. As on the prior exam, loss of disc space height and disc bulging are most notable at T6-7 and T7-8. The central canal and foramina remains open at all levels.   IMPRESSION: 1. Mild superior endplate compression fracture of T12 with mild edema in the vertebral body consistent subacute injury. Vertebral body height loss is estimated at up to 15%. 2. Edema in the T11 and T12 pedicles is most consistent with stress change. No pedicle fracture is identified. 3. 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 The central canal and foramina remain open at all levels.     Electronically Signed   By: Drusilla Kanner M.D.   On: 09/09/2022 09:37  I have personally reviewed the images and agree with the above interpretation.   In previous MyChart  message, her grand daughter asked that results be sent in MyChart. Message sent.

## 2022-09-11 NOTE — Addendum Note (Signed)
Addended byDrake Leach on: 09/11/2022 03:28 PM   Modules accepted: Orders

## 2022-09-11 NOTE — Telephone Encounter (Signed)
Referral done to IR to consider kyphoplasty at T12. Order for DEXA done as well.

## 2022-09-22 ENCOUNTER — Ambulatory Visit
Admission: RE | Admit: 2022-09-22 | Discharge: 2022-09-22 | Disposition: A | Discharge status: Home / Self Care | Payer: 59 | Source: Ambulatory Visit | Attending: Orthopedic Surgery | Admitting: Orthopedic Surgery

## 2022-09-22 DIAGNOSIS — S22080D Wedge compression fracture of T11-T12 vertebra, subsequent encounter for fracture with routine healing: Secondary | ICD-10-CM

## 2022-09-22 HISTORY — PX: IR RADIOLOGIST EVAL & MGMT: IMG5224

## 2022-09-22 NOTE — H&P (Signed)
Interventional Radiology - Clinic Visit, Initial H&P    Referring Provider: Drake Leach, PA-C  Reason for Visit: T12 compression fracture     History of Present Illness   The patient was seen today in the interventional radiology clinic for further evaluation and management of ongoing back pain related to T12 compression fracture.  Patient stated that on April 3rd 2024, the patient experienced a fall at a store following a stress test earlier that day.  She was evaluated in the emergency room, were cross-sectional imaging demonstrated an acute appearing T12 compression fracture.  The patient was subsequently seen at the neurosurgery clinic, where she was given a TLSO brace and prescription pain medication including oxycodone, Flexeril and gabapentin.  She was re-evaluated in the neurosurgery clinic on Aug 20, 2022, where she had persistent and unchanged back pain despite compliance with her conservative therapy.  Re-evaluation with MRI thoracic spine on Aug 28, 2022 demonstrated persistent edema and subacute fracture of the T12 vertebral body, with estimated 15% height loss.    The patient reports severe 9/10 back pain at baseline.  There is mild improvement with the prescribed pain medication to 5 to 6/10.  She takes the medication daily, although she complains of sleepiness and being unable to perform her normal daily activities while on the medication.  She reports severe disability on the [employer] disability questionnaire with 24/24 positive.  Prior to the fracture, she was independent and able to drive, but now she is unable to walk or stand for any prolonged period of time.    Additional Past Medical History Past Medical History:  Diagnosis Date   Allergy    Anxiety    CHF (congestive heart failure) (HCC)    Colon polyp    Depression    GERD (gastroesophageal reflux disease)    Heart murmur    Hyperlipidemia    Hypertension    Hypothyroidism    Sleep apnea    Thyroid disease       Surgical History  Past Surgical History:  Procedure Laterality Date   ABDOMINAL HYSTERECTOMY     BACK SURGERY     BACK SURGERY     CERVICAL FUSION     COLONOSCOPY WITH PROPOFOL N/A 05/28/2017   Procedure: COLONOSCOPY WITH PROPOFOL;  Surgeon: Wyline Mood, MD;  Location: [facility] ENDOSCOPY;  Service: Gastroenterology;  Laterality: N/A;   COLONOSCOPY WITH PROPOFOL N/A 11/17/2019   Procedure: COLONOSCOPY WITH PROPOFOL;  Surgeon: Regis Bill, MD;  Location: [facility] ENDOSCOPY;  Service: Endoscopy;  Laterality: N/A;   ESOPHAGOGASTRODUODENOSCOPY (EGD) WITH PROPOFOL N/A 11/17/2019   Procedure: ESOPHAGOGASTRODUODENOSCOPY (EGD) WITH PROPOFOL;  Surgeon: Regis Bill, MD;  Location: [facility] ENDOSCOPY;  Service: Endoscopy;  Laterality: N/A;   NECK SURGERY     THYROID SURGERY     THYROID SURGERY       Medications  I have reviewed the current medication list. Refer to chart for details. Current Outpatient Medications  Medication Instructions   acetaminophen (TYLENOL) 650 mg, Oral, Every 8 hours PRN   albuterol (VENTOLIN HFA) 108 (90 Base) MCG/ACT inhaler 2 puffs, Inhalation, Every 6 hours PRN   amLODipine (NORVASC) 5 MG tablet TAKE 1 TABLET(5 MG) BY MOUTH DAILY FOR BLOOD PRESSURE   aspirin 81 mg, Oral, Daily   Azelastine HCl 137 MCG/SPRAY SOLN SMARTSIG:1-2 Spray(s) Both Nares Twice Daily   b complex vitamins capsule 1 capsule, Oral, Daily   Blood Glucose Monitoring Suppl (ACCU-CHEK GUIDE) w/Device KIT See admin instructions   carvedilol (COREG) 12.5  mg, Oral, 2 times daily   cyclobenzaprine (FLEXERIL) 5 mg, Oral, 3 times daily PRN, Be careful, this can make you sleepy.   empagliflozin (JARDIANCE) 25 mg, Oral, Daily   ENTRESTO 24-26 MG 1 tablet, Oral, 2 times daily   escitalopram (LEXAPRO) 10 MG tablet Take 1 tablet by mouth daily   Farxiga 10 mg, Oral, Daily   fluticasone (FLOVENT HFA) 110 MCG/ACT inhaler 1 puff, Inhalation, 2 times daily   Fluticasone Furoate (ARNUITY ELLIPTA) 100  MCG/ACT AEPB 1 puff, Inhalation, Daily   Fluticasone Furoate (ARNUITY ELLIPTA) 100 MCG/ACT AEPB 1 puff, Inhalation, 2 times daily   furosemide (LASIX) 20 MG tablet TAKE 1 TABLET BY MOUTH MONDAY WEDNESDAY FRIDAY   gabapentin (NEURONTIN) 300 mg, Oral, 3 times daily   glucose blood test strip Use 1 strip to check your blood sugar two times daily.   isosorbide dinitrate (ISORDIL) 30 mg, Oral, Daily   isosorbide mononitrate (IMDUR) 30 MG 24 hr tablet TAKE 1 TABLET(30 MG) BY MOUTH DAILY   levothyroxine (SYNTHROID) 75 MCG tablet Take 1 tablet by mouth daily   lidocaine (LIDODERM) 5 % 1 patch, Transdermal, Every 12 hours, Remove & Discard patch within 12 hours or as directed by MD   meclizine (ANTIVERT) 12.5 mg, Oral, 3 times daily PRN   meloxicam (MOBIC) 15 MG tablet meloxicam 15 mg tablet<BR> Take 1 tablet every day as needed for pain   nitroGLYCERIN (NITROSTAT) 0.4 mg, Sublingual, Every 5 min PRN   oxyCODONE (ROXICODONE) 5 mg, Oral, Every 8 hours PRN   pantoprazole (PROTONIX) 20 mg, Oral, Daily, FOR ACID REFLUX AND STOMACH PAIN   polyethylene glycol powder (GLYCOLAX/MIRALAX) 17 GM/SCOOP powder    RESTASIS 0.05 % ophthalmic emulsion 1 drop, Both Eyes, 2 times daily   rosuvastatin (CRESTOR) 40 mg, Oral, Daily      Allergies Allergies  Allergen Reactions   Amlodipine Hives and Swelling   Tramadol Other (See Comments)    Dizziness    Isosorbide Dinitrate     Other Reaction(s): Headache   Codeine Nausea Only   Etodolac Other (See Comments)    Other reaction(s): Abdominal Pain   Does patient have contrast allergy: No     Physical Exam Current Vitals Temp: (!) 97.4 F (36.3 C) ( )  Pulse Rate: 69  Resp: 16  BP: 134/79  SpO2: 96 %        There is no height or weight on file to calculate BMI.  General: Alert and answers questions appropriately.  HEENT: Normocephalic, atraumatic.  Cardiac: Regular rate.  Pulmonary: Normal work of breathing. On room air. Back: TLSO brace on.  Tenderness at the thoracolumbar junction.    Pertinent Lab Results    Latest Ref Rng & Units 09/01/2022   10:50 AM 08/05/2022   11:08 PM 07/29/2022    5:19 PM  CBC  WBC 3.4 - 10.8 x10E3/uL 5.2  8.3  5.6   Hemoglobin 11.1 - 15.9 g/dL 16.1  09.6  04.5   Hematocrit 34.0 - 46.6 % 37.6  43.1  35.7   Platelets 150 - 400 K/uL  246  197       Latest Ref Rng & Units 09/01/2022   10:50 AM 08/05/2022   11:08 PM 07/29/2022    5:19 PM  CMP  Glucose 70 - 99 mg/dL 409  811  914   BUN 8 - 27 mg/dL 27  20  25    Creatinine 0.57 - 1.00 mg/dL 7.82  9.56  2.13   Sodium  134 - 144 mmol/L 143  139  139   Potassium 3.5 - 5.2 mmol/L 4.4  3.3  3.4   Chloride 96 - 106 mmol/L 107  102  104   CO2 20 - 29 mmol/L 24  26  24    Calcium 8.7 - 10.3 mg/dL 9.6  9.8  8.7   Total Protein 6.0 - 8.5 g/dL 7.0     Total Bilirubin 0.0 - 1.2 mg/dL 0.3     Alkaline Phos 44 - 121 IU/L 107     AST 0 - 40 IU/L 22     ALT 0 - 32 IU/L 19         Relevant and/or Recent Imaging: MRI Thoracic spine Aug 28 2022  IMPRESSION: 1. Mild superior endplate compression fracture of T12 with mild edema in the vertebral body consistent subacute injury. Vertebral body height loss is estimated at up to 15%. 2. Edema in the T11 and T12 pedicles is most consistent with stress change. No pedicle fracture is identified. 3. 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 The central canal and foramina remain open at all levels. Electronically Signed By: Drusilla Kanner M.D. On: 09/09/2022 09:37      Assessment & Plan:   Patient has suffered subacute osteoporotic fracture of the T12 vertebra.   History and exam have demonstrated the following:  Acute/Subacute fracture by imaging dated 08/28/2022, Pain on exam concordant with level of fracture, Failure of conservative therapy and pain refractory to narcotic pain mediation, Inability to tolerate narcotic pain medication due to side effects, and Significant disability on  the [employer] Disability Questionnaire with 24/24 positive symptoms, reflecting significant impact/impairment of (ADLs)   ICD-10-CM Codes that Support Medical Necessity ([email or web address]?[email or web address])  M80.08XA    Age-related osteoporosis with current pathological fracture, vertebra(e), initial encounter for fracture  S22.080A    Wedge compression fracture of T11-T12 vertebra, initial encounter for closed fracture    Plan:  T12 vertebral body augmentation with balloon kyphoplasty  DEXA pending  Post-procedure disposition: outpatient DRI-A  Medication holds: aspirin   The patient has suffered a fracture of the T12 vertebral body. It is recommended that patients aged 22 years or older be evaluated for possible testing or treatment of osteoporosis. A copy of this consult report is sent to the patient's referring physician.  Advanced Care Plan: The patient did not want to provide an Advanced Care Plan at the time of this visit     Total time spent on today's visit was over 60 Minutes, including both face-to-face time and non face-to-face time, personally spent on review of chart (including labs and relevant imaging), discussing further workup and treatment options, referral to specialist if needed, reviewing outside records if pertinent, answering patient questions, and coordinating care regarding T12 fracture as well as management strategy.       Olive Bass, MD  Vascular and Interventional Radiology 09/22/2022 9:11 AM

## 2022-09-30 ENCOUNTER — Ambulatory Visit
Admission: RE | Admit: 2022-09-30 | Discharge: 2022-09-30 | Disposition: A | Discharge status: Home / Self Care | Payer: 59 | Source: Ambulatory Visit | Attending: Orthopedic Surgery | Admitting: Orthopedic Surgery

## 2022-09-30 DIAGNOSIS — Z780 Asymptomatic menopausal state: Secondary | ICD-10-CM | POA: Diagnosis not present

## 2022-09-30 DIAGNOSIS — M859 Disorder of bone density and structure, unspecified: Secondary | ICD-10-CM | POA: Insufficient documentation

## 2022-09-30 DIAGNOSIS — S22080D Wedge compression fracture of T11-T12 vertebra, subsequent encounter for fracture with routine healing: Secondary | ICD-10-CM | POA: Diagnosis not present

## 2022-09-30 DIAGNOSIS — X58XXXD Exposure to other specified factors, subsequent encounter: Secondary | ICD-10-CM | POA: Insufficient documentation

## 2022-10-05 ENCOUNTER — Other Ambulatory Visit: Payer: Self-pay | Admitting: Orthopedic Surgery

## 2022-10-05 DIAGNOSIS — S22080G Wedge compression fracture of T11-T12 vertebra, subsequent encounter for fracture with delayed healing: Secondary | ICD-10-CM

## 2022-10-09 ENCOUNTER — Encounter: Payer: Self-pay | Admitting: Cardiovascular Disease

## 2022-10-09 ENCOUNTER — Ambulatory Visit (INDEPENDENT_AMBULATORY_CARE_PROVIDER_SITE_OTHER): Payer: Medicaid Other | Admitting: Cardiovascular Disease

## 2022-10-09 VITALS — BP 150/79 | HR 79 | Ht 64.0 in | Wt 167.2 lb

## 2022-10-09 DIAGNOSIS — I428 Other cardiomyopathies: Secondary | ICD-10-CM | POA: Diagnosis not present

## 2022-10-09 DIAGNOSIS — I10 Essential (primary) hypertension: Secondary | ICD-10-CM | POA: Diagnosis not present

## 2022-10-09 DIAGNOSIS — E782 Mixed hyperlipidemia: Secondary | ICD-10-CM | POA: Diagnosis not present

## 2022-10-09 DIAGNOSIS — I25118 Atherosclerotic heart disease of native coronary artery with other forms of angina pectoris: Secondary | ICD-10-CM | POA: Diagnosis not present

## 2022-10-09 MED ORDER — ENTRESTO 49-51 MG PO TABS: 1.0000 | ORAL_TABLET | Freq: Two times a day (BID) | ORAL | 3 refills | Status: DC

## 2022-10-09 NOTE — Progress Notes (Signed)
Cardiology Office Note   Date:  10/09/2022   ID:  Helen Mccormick, DOB [birth year], MRN [id number]  PCP:  [name], MD  Cardiologist:  Adrian Blackwater, MD      History of Present Illness: Helen Mccormick is a 82 y.o. female who presents for  Chief Complaint  Patient presents with   Follow-up    3 mo F/U & NST result    Feel tired      Past Medical History:  Diagnosis Date   Allergy    Anxiety    CHF (congestive heart failure) (HCC)    Colon polyp    Depression    GERD (gastroesophageal reflux disease)    Heart murmur    Hyperlipidemia    Hypertension    Hypothyroidism    Sleep apnea    Thyroid disease      Past Surgical History:  Procedure Laterality Date   ABDOMINAL HYSTERECTOMY     BACK SURGERY     BACK SURGERY     CERVICAL FUSION     COLONOSCOPY WITH PROPOFOL N/A 05/28/2017   Procedure: COLONOSCOPY WITH PROPOFOL;  Surgeon: Wyline Mood, MD;  Location: [facility] ENDOSCOPY;  Service: Gastroenterology;  Laterality: N/A;   COLONOSCOPY WITH PROPOFOL N/A 11/17/2019   Procedure: COLONOSCOPY WITH PROPOFOL;  Surgeon: Regis Bill, MD;  Location: [facility] ENDOSCOPY;  Service: Endoscopy;  Laterality: N/A;   ESOPHAGOGASTRODUODENOSCOPY (EGD) WITH PROPOFOL N/A 11/17/2019   Procedure: ESOPHAGOGASTRODUODENOSCOPY (EGD) WITH PROPOFOL;  Surgeon: Regis Bill, MD;  Location: [facility] ENDOSCOPY;  Service: Endoscopy;  Laterality: N/A;   IR RADIOLOGIST EVAL & MGMT  09/22/2022   NECK SURGERY     THYROID SURGERY     THYROID SURGERY       Current Outpatient Medications  Medication Sig Dispense Refill   Azelastine HCl 137 MCG/SPRAY SOLN SMARTSIG:1-2 Spray(s) Both Nares Twice Daily     b complex vitamins capsule Take 1 capsule by mouth daily.     Blood Glucose Monitoring Suppl (ACCU-CHEK GUIDE) w/Device KIT See admin instructions.     carvedilol (COREG) 12.5 MG tablet TAKE 1 TABLET BY MOUTH TWICE A DAY 180 tablet 2   cyclobenzaprine (FLEXERIL) 5 MG tablet Take 1  tablet (5 mg total) by mouth 3 (three) times daily as needed for muscle spasms. Be careful, this can make you sleepy. 30 tablet 0   empagliflozin (JARDIANCE) 25 MG TABS tablet Take 1 tablet (25 mg total) by mouth daily. 90 tablet 3   escitalopram (LEXAPRO) 10 MG tablet Take 1 tablet by mouth daily 30 tablet 11   Fluticasone Furoate (ARNUITY ELLIPTA) 100 MCG/ACT AEPB Inhale 1 puff into the lungs daily. 30 each 6   gabapentin (NEURONTIN) 300 MG capsule Take 1 capsule (300 mg total) by mouth 3 (three) times daily. 90 capsule 3   glucose blood test strip Use 1 strip to check your blood sugar two times daily. 50 each 12   isosorbide dinitrate (ISORDIL) 30 MG tablet Take 1 tablet by mouth daily 30 tablet 11   sacubitril-valsartan (ENTRESTO) 49-51 MG Take 1 tablet by mouth 2 (two) times daily. 60 tablet 3   acetaminophen (TYLENOL) 650 MG CR tablet Take 650 mg by mouth every 8 (eight) hours as needed for pain.     albuterol (VENTOLIN HFA) 108 (90 Base) MCG/ACT inhaler Inhale 2 puffs into the lungs every 6 (six) hours as needed for wheezing or shortness of breath. 1 each 6   amLODipine (NORVASC) 5 MG tablet  TAKE 1 TABLET(5 MG) BY MOUTH DAILY FOR BLOOD PRESSURE 90 tablet 1   aspirin 81 MG chewable tablet Chew 81 mg by mouth daily.  (Patient not taking: Reported on 10/09/2022)     FARXIGA 10 MG TABS tablet Take 10 mg by mouth daily.     fluticasone (FLOVENT HFA) 110 MCG/ACT inhaler Inhale 1 puff into the lungs 2 (two) times daily.     Fluticasone Furoate (ARNUITY ELLIPTA) 100 MCG/ACT AEPB Inhale 1 puff into the lungs 2 (two) times daily. 30 each 3   furosemide (LASIX) 20 MG tablet TAKE 1 TABLET BY MOUTH MONDAY WEDNESDAY FRIDAY 30 tablet 0   isosorbide mononitrate (IMDUR) 30 MG 24 hr tablet TAKE 1 TABLET(30 MG) BY MOUTH DAILY (Patient taking differently: Take 30 mg by mouth daily.) 30 tablet 2   levothyroxine (SYNTHROID) 75 MCG tablet Take 1 tablet by mouth daily 30 tablet 11   lidocaine (LIDODERM) 5 % Place 1  patch onto the skin every 12 (twelve) hours. Remove & Discard patch within 12 hours or as directed by MD 10 patch 0   meclizine (ANTIVERT) 12.5 MG tablet Take 1 tablet (12.5 mg total) by mouth 3 (three) times daily as needed for dizziness. 15 tablet 0   meloxicam (MOBIC) 15 MG tablet meloxicam 15 mg tablet  Take 1 tablet every day as needed for pain 30 tablet 0   nitroGLYCERIN (NITROSTAT) 0.4 MG SL tablet Place 1 tablet (0.4 mg total) under the tongue every 5 (five) minutes as needed for chest pain. 50 tablet 3   oxyCODONE (ROXICODONE) 5 MG immediate release tablet Take 1 tablet (5 mg total) by mouth every 8 (eight) hours as needed for severe pain. 15 tablet 0   pantoprazole (PROTONIX) 20 MG tablet Take 1 tablet (20 mg total) by mouth daily. FOR ACID REFLUX AND STOMACH PAIN 90 tablet 1   polyethylene glycol powder (GLYCOLAX/MIRALAX) 17 GM/SCOOP powder      RESTASIS 0.05 % ophthalmic emulsion Place 1 drop into both eyes 2 (two) times daily.     rosuvastatin (CRESTOR) 40 MG tablet Take 40 mg by mouth daily.     No current facility-administered medications for this visit.    Allergies:   Amlodipine, Tramadol, Isosorbide dinitrate, Codeine, and Etodolac    Social History:   reports that she has never smoked. She has quit using smokeless tobacco.  Her smokeless tobacco use included chew. She reports that she does not drink alcohol and does not use drugs.   Family History:  family history includes Breast cancer (age of onset: 31) in her maternal aunt; Cancer in her brother; Colon polyps in her sister; Diabetes in her mother, sister, and sister; Healthy in her daughter, daughter, sister, sister, son, and son; Heart Problems in her mother; Hypertension in her son; Leukemia in her mother; Mental illness in her sister; Stroke in her brother and father; Thyroid disease in her sister.    ROS:     Review of Systems  Constitutional: Negative.   HENT: Negative.    Eyes: Negative.   Respiratory: Negative.     Gastrointestinal: Negative.   Genitourinary: Negative.   Musculoskeletal: Negative.   Skin: Negative.   Neurological: Negative.   Endo/Heme/Allergies: Negative.   Psychiatric/Behavioral: Negative.    All other systems reviewed and are negative.     All other systems are reviewed and negative.    PHYSICAL EXAM: VS:  BP (!) 150/79   Pulse 79   Ht 5\' 4"  (1.626 m)  Wt 167 lb 3.2 oz (75.8 kg)   LMP  (LMP Unknown)   SpO2 98%   BMI 28.70 kg/m  , BMI Body mass index is 28.7 kg/m. Last weight:  Wt Readings from Last 3 Encounters:  10/09/22 167 lb 3.2 oz (75.8 kg)  09/01/22 168 lb (76.2 kg)  08/20/22 168 lb (76.2 kg)     Physical Exam Constitutional:      Appearance: Normal appearance.  Cardiovascular:     Rate and Rhythm: Normal rate and regular rhythm.     Heart sounds: Normal heart sounds.  Pulmonary:     Effort: Pulmonary effort is normal.     Breath sounds: Normal breath sounds.  Musculoskeletal:     Right lower leg: No edema.     Left lower leg: No edema.  Neurological:     Mental Status: She is alert.       EKG:   Recent Labs: 07/01/2022: Magnesium 2.2 08/05/2022: Platelets 246 09/01/2022: ALT 19; BUN 27; Creatinine, Ser 0.98; Hemoglobin 11.8; Potassium 4.4; Sodium 143; TSH 0.314    Lipid Panel    Component Value Date/Time   CHOL 126 09/01/2022 1050   TRIG 77 09/01/2022 1050   HDL 40 09/01/2022 1050   CHOLHDL 2.8 11/08/2018 0806   VLDL 23 08/16/2017 0528   LDLCALC 71 09/01/2022 1050   LDLCALC 60 11/08/2018 0806      Other studies Reviewed: Additional studies/ records that were reviewed today include:  Review of the above records demonstrates:      09/27/2019    8:55 AM  PAD Screen  Previous PAD dx? No  Previous surgical procedure? No  Pain with walking? No  Feet/toe relief with dangling? No  Painful, non-healing ulcers? No  Extremities discolored? Yes      ASSESSMENT AND PLAN:    ICD-10-CM   1. Nonischemic cardiomyopathy (HCC)   I42.8    Go up entersto to 49 mg bid as repeat BP 16/80    2. Coronary artery disease of native artery of native heart with stable angina pectoris (HCC)  I25.118    stable    3. Essential hypertension  I10     4. Mixed hyperlipidemia  E78.2        Problem List Items Addressed This Visit       Cardiovascular and Mediastinum   Nonischemic cardiomyopathy (HCC) - Primary   Relevant Medications   sacubitril-valsartan (ENTRESTO) 49-51 MG   Essential hypertension   Relevant Medications   sacubitril-valsartan (ENTRESTO) 49-51 MG   Coronary artery disease of native artery of native heart with stable angina pectoris (HCC)   Relevant Medications   sacubitril-valsartan (ENTRESTO) 49-51 MG     Other   Mixed hyperlipidemia   Relevant Medications   sacubitril-valsartan (ENTRESTO) 49-51 MG       Disposition:   Return in about 6 weeks (around 11/20/2022).    Total time spent: 30 minutes  Signed,  Adrian Blackwater, MD  10/09/2022 9:48 AM    [facility]

## 2022-10-13 ENCOUNTER — Other Ambulatory Visit: Payer: Self-pay | Admitting: Internal Medicine

## 2022-10-13 DIAGNOSIS — I10 Essential (primary) hypertension: Secondary | ICD-10-CM

## 2022-10-13 MED ORDER — MECLIZINE HCL 12.5 MG PO TABS: 12.5000 mg | ORAL_TABLET | Freq: Three times a day (TID) | ORAL | 0 refills | Status: DC | PRN

## 2022-10-13 NOTE — Telephone Encounter (Signed)
Helen Mccormick from [facility] pharmacy called requesting refill on meclizine. This was originally prescribed by an ER doctor.

## 2022-10-15 ENCOUNTER — Telehealth (HOSPITAL_COMMUNITY): Payer: Self-pay

## 2022-10-15 ENCOUNTER — Other Ambulatory Visit (HOSPITAL_COMMUNITY): Payer: Self-pay | Admitting: Interventional Radiology

## 2022-10-15 DIAGNOSIS — S22080G Wedge compression fracture of T11-T12 vertebra, subsequent encounter for fracture with delayed healing: Secondary | ICD-10-CM

## 2022-10-15 NOTE — Telephone Encounter (Signed)
Called to schedule kp, no answer, left vm. AB

## 2022-10-15 NOTE — Telephone Encounter (Signed)
Ok per Dr. Corliss Skains for T12 KP. AB

## 2022-10-19 ENCOUNTER — Other Ambulatory Visit: Payer: Self-pay | Admitting: Student

## 2022-10-19 ENCOUNTER — Telehealth: Payer: Self-pay

## 2022-10-19 DIAGNOSIS — S22080A Wedge compression fracture of T11-T12 vertebra, initial encounter for closed fracture: Secondary | ICD-10-CM

## 2022-10-19 NOTE — Telephone Encounter (Signed)
Patient LM asking for call back, message had a lot of static and unable to understand

## 2022-10-20 ENCOUNTER — Other Ambulatory Visit: Payer: Self-pay | Admitting: Internal Medicine

## 2022-10-20 ENCOUNTER — Ambulatory Visit (HOSPITAL_COMMUNITY)
Admission: RE | Admit: 2022-10-20 | Discharge: 2022-10-20 | Disposition: A | Discharge status: Home / Self Care | Payer: 59 | Source: Ambulatory Visit | Attending: Interventional Radiology | Admitting: Interventional Radiology

## 2022-10-20 ENCOUNTER — Other Ambulatory Visit: Payer: Self-pay

## 2022-10-20 DIAGNOSIS — G4733 Obstructive sleep apnea (adult) (pediatric): Secondary | ICD-10-CM | POA: Insufficient documentation

## 2022-10-20 DIAGNOSIS — S22080G Wedge compression fracture of T11-T12 vertebra, subsequent encounter for fracture with delayed healing: Secondary | ICD-10-CM | POA: Insufficient documentation

## 2022-10-20 DIAGNOSIS — H3530 Unspecified macular degeneration: Secondary | ICD-10-CM | POA: Insufficient documentation

## 2022-10-20 DIAGNOSIS — W19XXXD Unspecified fall, subsequent encounter: Secondary | ICD-10-CM | POA: Insufficient documentation

## 2022-10-20 DIAGNOSIS — I10 Essential (primary) hypertension: Secondary | ICD-10-CM | POA: Insufficient documentation

## 2022-10-20 DIAGNOSIS — J45909 Unspecified asthma, uncomplicated: Secondary | ICD-10-CM | POA: Diagnosis not present

## 2022-10-20 DIAGNOSIS — I429 Cardiomyopathy, unspecified: Secondary | ICD-10-CM | POA: Diagnosis not present

## 2022-10-20 DIAGNOSIS — S22080A Wedge compression fracture of T11-T12 vertebra, initial encounter for closed fracture: Secondary | ICD-10-CM

## 2022-10-20 HISTORY — PX: IR KYPHO THORACIC WITH BONE BIOPSY: IMG5518

## 2022-10-20 LAB — PROTIME-INR
INR: 1.1 (ref 0.8–1.2)
INR: 1.1 (ref 0.8–1.2)
Prothrombin Time: 14.3 seconds (ref 11.4–15.2)
Prothrombin Time: 14.4 seconds (ref 11.4–15.2)

## 2022-10-20 LAB — CBC
HCT: 40.1 % (ref 36.0–46.0)
Hemoglobin: 12.5 g/dL (ref 12.0–15.0)
MCH: 24.1 pg — ABNORMAL LOW (ref 26.0–34.0)
MCHC: 31.2 g/dL (ref 30.0–36.0)
MCV: 77.3 fL — ABNORMAL LOW (ref 80.0–100.0)
Platelets: 209 10*3/uL (ref 150–400)
RBC: 5.19 MIL/uL — ABNORMAL HIGH (ref 3.87–5.11)
RDW: 16.6 % — ABNORMAL HIGH (ref 11.5–15.5)
WBC: 6.2 10*3/uL (ref 4.0–10.5)
nRBC: 0 % (ref 0.0–0.2)

## 2022-10-20 MED ORDER — FENTANYL CITRATE (PF) 100 MCG/2ML IJ SOLN
INTRAMUSCULAR | Status: AC
  Filled 2022-10-20: qty 2

## 2022-10-20 MED ORDER — HYDROMORPHONE HCL 1 MG/ML IJ SOLN
INTRAMUSCULAR | Status: AC | PRN
  Administered 2022-10-20: 1 mg via INTRAVENOUS

## 2022-10-20 MED ORDER — CEFAZOLIN SODIUM-DEXTROSE 2-4 GM/100ML-% IV SOLN
INTRAVENOUS | Status: AC
  Filled 2022-10-20: qty 100

## 2022-10-20 MED ORDER — LIDOCAINE HCL 1 % IJ SOLN
INTRAMUSCULAR | Status: AC
  Filled 2022-10-20: qty 20

## 2022-10-20 MED ORDER — CEFAZOLIN SODIUM-DEXTROSE 2-4 GM/100ML-% IV SOLN
2.0000 g | Freq: Once | INTRAVENOUS | Status: AC
  Administered 2022-10-20: 2 g via INTRAVENOUS

## 2022-10-20 MED ORDER — LIDOCAINE HCL (PF) 1 % IJ SOLN
INTRAMUSCULAR | Status: AC
  Filled 2022-10-20: qty 30

## 2022-10-20 MED ORDER — MIDAZOLAM HCL 2 MG/2ML IJ SOLN
INTRAMUSCULAR | Status: AC | PRN
  Administered 2022-10-20: 1 mg via INTRAVENOUS
  Administered 2022-10-20 (×3): .5 mg via INTRAVENOUS
  Administered 2022-10-20: 1 mg via INTRAVENOUS

## 2022-10-20 MED ORDER — FENTANYL CITRATE (PF) 100 MCG/2ML IJ SOLN
INTRAMUSCULAR | Status: AC | PRN
  Administered 2022-10-20 (×6): 25 ug via INTRAVENOUS

## 2022-10-20 MED ORDER — HYDROMORPHONE HCL 1 MG/ML IJ SOLN
INTRAMUSCULAR | Status: AC
  Filled 2022-10-20: qty 1

## 2022-10-20 MED ORDER — MIDAZOLAM HCL 2 MG/2ML IJ SOLN
INTRAMUSCULAR | Status: AC
  Filled 2022-10-20: qty 2

## 2022-10-20 MED ORDER — GELATIN ABSORBABLE 12-7 MM EX MISC
CUTANEOUS | Status: AC
  Filled 2022-10-20: qty 1

## 2022-10-20 MED ORDER — TOBRAMYCIN SULFATE 1.2 G IJ SOLR
1.2000 g | INTRAMUSCULAR | Status: AC
  Administered 2022-10-20: 0.01 g via TOPICAL

## 2022-10-20 MED ORDER — TOBRAMYCIN SULFATE 1.2 G IJ SOLR
INTRAMUSCULAR | Status: AC
  Filled 2022-10-20: qty 1.2

## 2022-10-20 MED ORDER — IOHEXOL 300 MG/ML  SOLN
50.0000 mL | Freq: Once | INTRAMUSCULAR | Status: AC | PRN
  Administered 2022-10-20: 3 mL

## 2022-10-20 MED ORDER — LIDOCAINE HCL (PF) 1 % IJ SOLN
30.0000 mL | Freq: Once | INTRAMUSCULAR | Status: AC
  Administered 2022-10-20: 20 mL via INTRADERMAL

## 2022-10-20 MED ORDER — BUPIVACAINE HCL 0.25 % IJ SOLN
30.0000 mL | Freq: Once | INTRAMUSCULAR | Status: AC
  Administered 2022-10-20: 15 mL
  Filled 2022-10-20: qty 30

## 2022-10-20 MED ORDER — BUPIVACAINE HCL (PF) 0.25 % IJ SOLN
INTRAMUSCULAR | Status: AC
  Filled 2022-10-20: qty 30

## 2022-10-20 MED ORDER — SODIUM CHLORIDE 0.9 % IV SOLN: INTRAVENOUS | Status: DC

## 2022-10-20 MED ORDER — SODIUM CHLORIDE 0.9 % IV SOLN: INTRAVENOUS | Status: AC

## 2022-10-20 NOTE — Discharge Instructions (Addendum)
1. No stooping ,.bending ,lifting weights above 10 pounds for 2 weeks.  2.  Use a walker to ambulate for 2 weeks.  3.  No driving for 2 weeks.  4.  Check with referring MD as needed in 2 weeks.  Fatima Sanger MD

## 2022-10-20 NOTE — Procedures (Signed)
INR.  Status post T12 balloon kyphoplasty.  Left transpedicular approach.  No acute complications.  The patient tolerated the procedure well.  Fatima Sanger MD

## 2022-10-20 NOTE — H&P (Signed)
Chief Complaint: Patient was seen in consultation today for T12 kyphoplasty/vertebroplasty  Referring Physician(s): Drake Leach, PA-C - Neurosurgery   Supervising Physician: Julieanne Cotton  Patient Status: [facility] - Out-pt  History of Present Illness:  Helen Mccormick is an 82 y.o. female with a medical history significant for HTN, cardiomyopathy, OSA, asthma and macular degeneration. She presented to the ED 07/01/22 due to a syncopal episode with a fall and was evaluated by Neurosurgery. A TLSO brace was recommended and she was discharged with outpatient follow up. Repeat appointments with Neurosurgery found the patient to have constant back pain that was not improving. She last met with Neurosurgery 08/20/22 and the patient complained of activity limiting pain and spasms unrelieved by OTC medications. She had been compliant with the back brace. Radiographs on that date showed a stable T12 compression fracture. An MRI was order for further evaluation.   MR Thoracic Spine without contrast 08/28/22 Vertebrae: There is a mild superior endplate compression fracture of T12 which is new since the prior MRI. Vertebral body height loss is estimated at up to 15%. There is mild marrow edema within the vertebral body. Edema is also seen in the T11 and T12 pedicles most consistent with stress change. No pedicle fracture is identified. Degenerative endplate signal change T6-7 and T7 a is stable in appearance. IMPRESSION: 1. Mild superior endplate compression fracture of T12 with mild edema in the vertebral body consistent subacute injury. Vertebral body height loss is estimated at up to 15%. 2. Edema in the T11 and T12 pedicles is most consistent with stress change. No pedicle fracture is identified. 3. 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 The central canal and foramina remain open at all levels.  Her neurosurgery team placed a referral to Interventional  Radiology and she met with Dr. Juliette Alcide 09/22/22 to discuss further treatment/management options. The patient reported 9/10 back pain with mild improvement with pain medication. The pain medications make her sleepy and she is unable to perform her normal activities. Prior to the fracture she was independent and able to drive. Now she is unable to walk or stand for any prolonged period of time.    She has failed conservative management, has pain refractory to narcotics and is unable to tolerate narcotic pain medication due to side effects. She has a significant disability on the [employer] Disability Questionnaire with 24/24 positive symptoms. This score reflects a significant impact/impairment in her abilities to perform her ADLs. Dr. Juliette Alcide discussed vertebral augmentation with balloon kyphoplasty and after a discussion of the risks/benefits the patient was in agreement to proceed.   Past Medical History:  Diagnosis Date   Allergy    Anxiety    CHF (congestive heart failure) (HCC)    Colon polyp    Depression    GERD (gastroesophageal reflux disease)    Heart murmur    Hyperlipidemia    Hypertension    Hypothyroidism    Sleep apnea    Thyroid disease     Past Surgical History:  Procedure Laterality Date   ABDOMINAL HYSTERECTOMY     BACK SURGERY     BACK SURGERY     CERVICAL FUSION     COLONOSCOPY WITH PROPOFOL N/A 05/28/2017   Procedure: COLONOSCOPY WITH PROPOFOL;  Surgeon: Wyline Mood, MD;  Location: [facility] ENDOSCOPY;  Service: Gastroenterology;  Laterality: N/A;   COLONOSCOPY WITH PROPOFOL N/A 11/17/2019   Procedure: COLONOSCOPY WITH PROPOFOL;  Surgeon: Regis Bill, MD;  Location: [facility] ENDOSCOPY;  Service: Endoscopy;  Laterality: N/A;   ESOPHAGOGASTRODUODENOSCOPY (EGD) WITH PROPOFOL N/A 11/17/2019   Procedure: ESOPHAGOGASTRODUODENOSCOPY (EGD) WITH PROPOFOL;  Surgeon: Regis Bill, MD;  Location: [facility] ENDOSCOPY;  Service: Endoscopy;  Laterality: N/A;   IR RADIOLOGIST  EVAL & MGMT  09/22/2022   NECK SURGERY     THYROID SURGERY     THYROID SURGERY      Allergies: Amlodipine, Tramadol, Isosorbide dinitrate, Codeine, and Etodolac  Medications: Prior to Admission medications   Medication Sig Start Date End Date Taking? Authorizing Provider  acetaminophen (TYLENOL) 650 MG CR tablet Take 650 mg by mouth every 8 (eight) hours as needed for pain.    [provider]  albuterol (VENTOLIN HFA) 108 (90 Base) MCG/ACT inhaler Inhale 2 puffs into the lungs every 6 (six) hours as needed for wheezing or shortness of breath. 08/20/22   [name], MD  amLODipine (NORVASC) 5 MG tablet Take 1 tablet by mouth every day 10/13/22   [name], MD  aspirin 81 MG chewable tablet Chew 81 mg by mouth daily.  Patient not taking: Reported on 10/09/2022    [provider]  Azelastine HCl 137 MCG/SPRAY SOLN SMARTSIG:1-2 Spray(s) Both Nares Twice Daily 07/24/19   [provider]  b complex vitamins capsule Take 1 capsule by mouth daily.    [provider]  Blood Glucose Monitoring Suppl (ACCU-CHEK GUIDE) w/Device KIT See admin instructions. 11/28/20   [provider]  carvedilol (COREG) 12.5 MG tablet TAKE 1 TABLET BY MOUTH TWICE A DAY 08/24/22   Laurier Nancy, MD  cyclobenzaprine (FLEXERIL) 5 MG tablet Take 1 tablet (5 mg total) by mouth 3 (three) times daily as needed for muscle spasms. Be careful, this can make you sleepy. 08/20/22   Drake Leach, PA-C  empagliflozin (JARDIANCE) 25 MG TABS tablet Take 1 tablet (25 mg total) by mouth daily. 08/04/22   [name], MD  escitalopram (LEXAPRO) 10 MG tablet Take 1 tablet by mouth daily 07/14/22   [name], MD  FARXIGA 10 MG TABS tablet Take 10 mg by mouth daily.    [provider]  fluticasone (FLOVENT HFA) 110 MCG/ACT inhaler Inhale 1 puff into the lungs 2 (two) times daily.    [provider]  Fluticasone Furoate (ARNUITY ELLIPTA) 100 MCG/ACT AEPB Inhale 1 puff into  the lungs daily. 08/07/22   [name], MD  Fluticasone Furoate (ARNUITY ELLIPTA) 100 MCG/ACT AEPB Inhale 1 puff into the lungs 2 (two) times daily. 08/11/22   [name], MD  furosemide (LASIX) 20 MG tablet TAKE 1 TABLET BY MOUTH MONDAY [facility] FRIDAY 07/31/22   [name], MD  gabapentin (NEURONTIN) 100 MG capsule Take 1 capsule by mouth 3 times a day 10/13/22   [name], MD  gabapentin (NEURONTIN) 300 MG capsule Take 1 capsule (300 mg total) by mouth 3 (three) times daily. 07/27/22   [name], MD  glucose blood test strip Use 1 strip to check your blood sugar two times daily. 06/21/18   Galen Manila, NP  isosorbide dinitrate (ISORDIL) 30 MG tablet Take 1 tablet by mouth daily 07/14/22   [name], MD  isosorbide mononitrate (IMDUR) 30 MG 24 hr tablet TAKE 1 TABLET(30 MG) BY MOUTH DAILY Patient taking differently: Take 30 mg by mouth daily. 07/24/18   Smitty Cords, DO  levothyroxine (SYNTHROID) 75 MCG tablet Take 1 tablet by mouth daily 07/14/22  [name], MD  lidocaine (LIDODERM) 5 % Place 1 patch onto the skin every 12 (twelve) hours. Remove & Discard patch within 12 hours or as directed by MD 08/06/22 08/06/23  Delton Prairie, MD  meclizine (ANTIVERT) 12.5 MG tablet Take 1 tablet (12.5 mg total) by mouth 3 (three) times daily as needed for dizziness. 10/13/22   [name], MD  meloxicam (MOBIC) 15 MG tablet meloxicam 15 mg tablet  Take 1 tablet every day as needed for pain 07/02/22   Arnetha Courser, MD  nitroGLYCERIN (NITROSTAT) 0.4 MG SL tablet Place 1 tablet (0.4 mg total) under the tongue every 5 (five) minutes as needed for chest pain. 08/20/17   Galen Manila, NP  oxyCODONE (ROXICODONE) 5 MG immediate release tablet Take 1 tablet (5 mg total) by mouth every 8 (eight) hours as needed for severe pain. 08/20/22   Drake Leach, PA-C  pantoprazole (PROTONIX) 20 MG tablet Take 1 tablet (20 mg total) by mouth daily. FOR ACID REFLUX AND STOMACH PAIN  06/21/18 07/10/22  Galen Manila, NP  polyethylene glycol powder (GLYCOLAX/MIRALAX) 17 GM/SCOOP powder     [provider]  RESTASIS 0.05 % ophthalmic emulsion Place 1 drop into both eyes 2 (two) times daily. 06/26/22   [provider]  rosuvastatin (CRESTOR) 40 MG tablet Take 1 tablet by mouth every day 10/13/22   [name], MD  sacubitril-valsartan (ENTRESTO) 49-51 MG Take 1 tablet by mouth 2 (two) times daily. 10/09/22   Laurier Nancy, MD     Family History  Problem Relation Age of Onset   Breast cancer Maternal Aunt 63   Diabetes Mother    Leukemia Mother    Heart Problems Mother    Diabetes Sister    Mental illness Sister    Colon polyps Sister    Stroke Father    Cancer Brother        rectal   Healthy Daughter    Hypertension Son    Diabetes Sister    Thyroid disease Sister    Healthy Sister    Healthy Sister    Stroke Brother    Healthy Son    Healthy Son    Healthy Daughter     Social History   Socioeconomic History   Marital status: Single    Spouse name: Not on file   Number of children: Not on file   Years of education: Not on file   Highest education level: 10th grade  Occupational History   Occupation: retired  Tobacco Use   Smoking status: Never   Smokeless tobacco: Former    Types: Associate Professor status: Never Used  Substance and Sexual Activity   Alcohol use: No   Drug use: No   Sexual activity: Not Currently  Other Topics Concern   Not on file  Social History Narrative   Not on file   Social Determinants of Health   Financial Resource Strain: Low Risk  (04/27/2017)   Overall Financial Resource Strain (CARDIA)    Difficulty of Paying Living Expenses: Not hard at all  Food Insecurity: No Food Insecurity (04/27/2017)   Hunger Vital Sign    Worried About Running Out of Food in the Last Year: Never true    Ran Out of Food in the Last Year: Never true  Transportation Needs: No Transportation Needs  (04/27/2017)   PRAPARE - Transportation    Lack of Transportation (Medical): No    Lack of  Transportation (Non-Medical): No  Physical Activity: Sufficiently Active (12/20/2018)   Exercise Vital Sign    Days of Exercise per Week: 4 days    Minutes of Exercise per Session: 60 min  Stress: No Stress Concern Present (03/12/2017)   [employer] of Occupational Health - Occupational Stress Questionnaire    Feeling of Stress : Only a little  Social Connections: Moderately Integrated (04/27/2017)   Social Connection and Isolation Panel [NHANES]    Frequency of Communication with Friends and Family: More than three times a week    Frequency of Social Gatherings with Friends and Family: Once a week    Attends Religious Services: More than 4 times per year    Active Member of [employer] or Organizations: Yes    Attends Engineer, structural: More than 4 times per year    Marital Status: Never married    Review of Systems: A 12 point ROS discussed and pertinent positives are indicated in the HPI above.  All other systems are negative.  Review of Systems  Constitutional:  Positive for appetite change and fatigue.  Respiratory:  Negative for cough and shortness of breath.   Cardiovascular:  Negative for chest pain and leg swelling.  Gastrointestinal:  Positive for diarrhea, nausea and vomiting. Negative for abdominal pain.  Musculoskeletal:  Positive for arthralgias, back pain and myalgias.  Neurological:  Negative for dizziness and headaches.    Vital Signs: BP (!) 166/74   Pulse 68   Temp 97.9 F (36.6 C) (Temporal)   Resp 16   Ht 5\' 4"  (1.626 m)   Wt 160 lb (72.6 kg)   LMP  (LMP Unknown)   SpO2 98%   BMI 27.46 kg/m   Physical Exam Constitutional:      General: She is not in acute distress.    Appearance: She is not ill-appearing.  HENT:     Mouth/Throat:     Mouth: Mucous membranes are moist.     Pharynx: Oropharynx is clear.  Cardiovascular:     Rate and Rhythm:  Normal rate and regular rhythm.     Pulses: Normal pulses.     Heart sounds: Normal heart sounds.  Pulmonary:     Effort: Pulmonary effort is normal.     Breath sounds: Normal breath sounds.  Abdominal:     General: Bowel sounds are normal.     Palpations: Abdomen is soft.  Musculoskeletal:        General: Signs of injury present.     Comments: Back pain   Skin:    General: Skin is warm and dry.  Neurological:     Mental Status: She is alert and oriented to person, place, and time.  Psychiatric:        Mood and Affect: Mood normal.        Behavior: Behavior normal.        Thought Content: Thought content normal.        Judgment: Judgment normal.     Imaging: DG Bone Density  Result Date: 09/30/2022 EXAM: DUAL X-RAY ABSORPTIOMETRY (DXA) FOR BONE MINERAL DENSITY IMPRESSION: Your patient Honour Schwieger completed a BMD test on 09/30/2022 using the [employer] Advance DXA System (analysis version: 14.10) manufactured by [employer]. The following summarizes the results of our evaluation. Technologist: LCE PATIENT BIOGRAPHICAL: Name: Everley, Evora Patient ID: [id number] Birth Date: 1940-11-20 Height: 63.0 in. Gender: Female Exam Date: 09/30/2022 Weight: 169.0 lbs. Indications: Advanced Age, History of Fracture (Adult), POSTmenopausal Fractures: C-Spine Treatments: ASSESSMENT:  The BMD measured at Femur Neck Left is 0.924 g/cm2 with a T-score of -0.8. This patient is considered normal according to World Health Organization [facility]) criteria. L-3 and L-4 were excluded due to degenerative changes. The scan quality is good. Site Region Measured Measured WHO Young Adult BMD Date       Age      Classification T-score AP Spine L1-L2 09/30/2022 80.0 Normal -0.3 1.133 g/cm2 DualFemur Neck Left 09/30/2022 80.0 Normal -0.8 0.924 g/cm2 World Health Organization [facility]) criteria for post-menopausal, Caucasian Women: Normal:       T-score at or above -1 SD Osteopenia:   T-score between -1 and -2.5 SD  Osteoporosis: T-score at or below -2.5 SD RECOMMENDATIONS: 1. All patients should optimize calcium and vitamin D intake. 2. Consider FDA-approved medical therapies in postmenopausal women and men aged 104 years and older, based on the following: a. A hip or vertebral (clinical or morphometric) fracture b. T-score < -2.5 at the femoral neck or spine after appropriate evaluation to exclude secondary causes c. Low bone mass (T-score between -1.0 and -2.5 at the femoral neck or spine) and a 10-year probability of a hip fracture > 3% or a 10-year probability of a major osteoporosis-related fracture > 20% based on the [place]-adapted WHO algorithm d. Clinician judgment and/or patient preferences may indicate treatment for people with 10-year fracture probabilities above or below these levels FOLLOW-UP: People with diagnosed cases of osteoporosis or at high risk for fracture should have regular bone mineral density tests. For patients eligible for Medicare, routine testing is allowed once every 2 years. The testing frequency can be increased to one year for patients who have rapidly progressing disease, those who are receiving or discontinuing medical therapy to restore bone mass, or have additional risk factors. I have reviewed this report, and agree with the above findings. [facility] Electronically Signed   By: Frederico Hamman M.D.   On: 09/30/2022 15:29   IR Radiologist Eval & Mgmt  Result Date: 09/22/2022 EXAM: NEW PATIENT OFFICE VISIT CHIEF COMPLAINT: Documented in the EMR HISTORY OF PRESENT ILLNESS: The patient was seen today in the interventional radiology clinic for further evaluation and management of ongoing back pain related to T12 compression fracture. Patient stated that on April 3rd 2024, the patient experienced a fall at a store following a stress test earlier that day. She was evaluated in the emergency room, were cross-sectional imaging demonstrated an acute appearing T12 compression fracture.  The patient was subsequently seen at the neurosurgery clinic, where she was given a TLSO brace and prescription pain medication including oxycodone, Flexeril and gabapentin. She was re-evaluated in the neurosurgery clinic on Aug 20, 2022, where she had persistent and unchanged back pain despite compliance with her conservative therapy. Re-evaluation with MRI thoracic spine on Aug 28, 2022 demonstrated persistent edema and subacute fracture of the T12 vertebral body, with estimated 15% height loss. The patient reports severe 9/10 back pain at baseline. There is mild improvement with the prescribed pain medication to 5 to 6/10. She takes the medication daily, although she complains of sleepiness and being unable to perform her normal daily activities while on the medication. She reports severe disability on the [employer] disability questionnaire with 24/24 positive. Prior to the fracture, she was independent and able to drive, but now she is unable to walk or stand for any prolonged period of time. REVIEW OF SYSTEMS: Documented in the EMR PHYSICAL EXAMINATION: Documented in the EMR ASSESSMENT AND PLAN: Documented in the  EMR Electronically Signed   By: Olive Bass M.D.   On: 09/22/2022 09:20    Labs:  CBC: Recent Labs    07/08/22 1512 07/29/22 1719 08/05/22 2308 09/01/22 1050 10/20/22 0915  WBC 6.6 5.6 8.3 5.2 6.2  HGB 12.4 11.2* 13.4 11.8 12.5  HCT 39.5 35.7* 43.1 37.6 40.1  PLT 184 197 246  --  209    COAGS: No results for input(s): "INR", "APTT" in the last 8760 hours.  BMP: Recent Labs    07/02/22 0441 07/08/22 1512 07/29/22 1719 08/05/22 2308 09/01/22 1050  NA 141 142 139 139 143  K 3.4* 3.7 3.4* 3.3* 4.4  CL 109 106 104 102 107*  CO2 25 26 24 26 24   GLUCOSE 99 157* 158* 131* 129*  BUN 18 20 25* 20 27  CALCIUM 9.2 9.1 8.7* 9.8 9.6  CREATININE 0.80 0.84 0.87 0.80 0.98  GFRNONAA >60 >60 >60 >60  --     LIVER FUNCTION TESTS: Recent Labs    04/24/22 0928 06/01/22 1107  07/01/22 1415 09/01/22 1050  BILITOT 0.7 0.4 0.7 0.3  AST 22 25 31 22   ALT 17 24 28 19   ALKPHOS 72 79 68 107  PROT 7.4 6.7 6.4* 7.0  ALBUMIN 3.8 4.2 3.5 4.4    TUMOR MARKERS: No results for input(s): "AFPTM", "CEA", "CA199", "CHROMGRNA" in the last 8760 hours.  Assessment and Plan:  T12 compression fracture: Babs Sciara. Shively, 82 year old female, presents today to the [facility] Neuro Interventional Radiology department for an image-guided T12 kyphoplasty/vertebroplasty  Risks and benefits of T12 kyphoplasty/vertebroplasty were discussed with the patient including, but not limited to education regarding the natural healing process of compression fractures without intervention, bleeding, infection, cement migration which may cause spinal cord damage, paralysis, pulmonary embolism or even death. This interventional procedure involves the use of X-rays and because of the nature of the planned procedure, it is possible that we will have prolonged use of X-ray fluoroscopy. Potential radiation risks to you include (but are not limited to) the following: - A slightly elevated risk for cancer  several years later in life. This risk is typically less than 0.5% percent. This risk is low in comparison to the normal incidence of human cancer, which is 33% for women and 50% for men according to the American Cancer Society. - Radiation induced injury can include skin redness, resembling a rash, tissue breakdown / ulcers and hair loss (which can be temporary or permanent).  The likelihood of either of these occurring depends on the difficulty of the procedure and whether you are sensitive to radiation due to previous procedures, disease, or genetic conditions.  IF your procedure requires a prolonged use of radiation, you will be notified and given written instructions for further action.  It is your responsibility to monitor the irradiated area for the 2 weeks following the procedure and to notify your  physician if you are concerned that you have suffered a radiation induced injury.    All of the patient's questions were answered, patient is agreeable to proceed. She has been NPO. She is a full code. She does not take any blood-thinning medications.   Consent signed and in chart.  Thank you for this interesting consult.  I greatly enjoyed meeting Mikenzie Mccannon Rochefort and look forward to participating in their care.  A copy of this report was sent to the requesting provider on this date.  Electronically Signed: Alwyn Ren, AGACNP-BC [phone] 10/20/2022, 9:55 AM   I spent  a total of  30 Minutes   in face to face in clinical consultation, greater than 50% of which was counseling/coordinating care for T12 kyphoplasty/vertebroplasty

## 2022-10-21 ENCOUNTER — Other Ambulatory Visit: Payer: Self-pay | Admitting: Student

## 2022-10-22 ENCOUNTER — Other Ambulatory Visit (HOSPITAL_COMMUNITY): Payer: Self-pay | Admitting: Interventional Radiology

## 2022-10-22 DIAGNOSIS — S22080G Wedge compression fracture of T11-T12 vertebra, subsequent encounter for fracture with delayed healing: Secondary | ICD-10-CM

## 2022-10-24 ENCOUNTER — Other Ambulatory Visit: Payer: Self-pay

## 2022-10-24 ENCOUNTER — Emergency Department
Admission: EM | Admit: 2022-10-24 | Discharge: 2022-10-24 | Disposition: A | Discharge status: Home / Self Care | Payer: 59 | Attending: Student in an Organized Health Care Education/Training Program | Admitting: Student in an Organized Health Care Education/Training Program

## 2022-10-24 ENCOUNTER — Emergency Department: Payer: 59

## 2022-10-24 DIAGNOSIS — R109 Unspecified abdominal pain: Secondary | ICD-10-CM | POA: Diagnosis present

## 2022-10-24 DIAGNOSIS — R300 Dysuria: Secondary | ICD-10-CM | POA: Insufficient documentation

## 2022-10-24 DIAGNOSIS — I10 Essential (primary) hypertension: Secondary | ICD-10-CM | POA: Diagnosis not present

## 2022-10-24 DIAGNOSIS — E119 Type 2 diabetes mellitus without complications: Secondary | ICD-10-CM | POA: Diagnosis not present

## 2022-10-24 LAB — URINALYSIS, ROUTINE W REFLEX MICROSCOPIC
Bacteria, UA: NONE SEEN
Bilirubin Urine: NEGATIVE
Glucose, UA: >=500 mg/dL — AB
Hgb urine dipstick: NEGATIVE
Ketones, ur: NEGATIVE mg/dL
Leukocytes,Ua: NEGATIVE
Nitrite: NEGATIVE
Protein, ur: NEGATIVE mg/dL
Specific Gravity, Urine: 1.023 (ref 1.005–1.030)
pH: 6 (ref 5.0–8.0)

## 2022-10-24 LAB — BASIC METABOLIC PANEL
Anion gap: 9 (ref 5–15)
BUN: 18 mg/dL (ref 8–23)
CO2: 26 mmol/L (ref 22–32)
Calcium: 8.9 mg/dL (ref 8.9–10.3)
Chloride: 105 mmol/L (ref 98–111)
Creatinine, Ser: 0.83 mg/dL (ref 0.44–1.00)
GFR, Estimated: >60 mL/min (ref >60)
Glucose, Bld: 117 mg/dL — ABNORMAL HIGH (ref 70–99)
Potassium: 3.4 mmol/L — ABNORMAL LOW (ref 3.5–5.1)
Sodium: 140 mmol/L (ref 135–145)

## 2022-10-24 LAB — CBC
HCT: 38.9 % (ref 36.0–46.0)
Hemoglobin: 12.1 g/dL (ref 12.0–15.0)
MCH: 24 pg — ABNORMAL LOW (ref 26.0–34.0)
MCHC: 31.1 g/dL (ref 30.0–36.0)
MCV: 77 fL — ABNORMAL LOW (ref 80.0–100.0)
Platelets: 201 10*3/uL (ref 150–400)
RBC: 5.05 MIL/uL (ref 3.87–5.11)
RDW: 16 % — ABNORMAL HIGH (ref 11.5–15.5)
WBC: 4.9 10*3/uL (ref 4.0–10.5)
nRBC: 0 % (ref 0.0–0.2)

## 2022-10-24 NOTE — ED Provider Notes (Signed)
[facility] Provider Note    Event Date/Time   First MD Initiated Contact with Patient 10/24/22 1123     (approximate)   History   Flank Pain   HPI  Helen Mccormick is a 82 y.o. female with a history of hypertension, diabetes, degenerative disc disease presents to the ER for evaluation of left flank pain radiating down her left groin.  States it is moderate to severe waxing and waning currently pain-free.  Thought she might of had some dysuria but not currently describing any burning discomfort.  No hematuria no foul odor.  No fevers or chills.     Physical Exam   Triage Vital Signs: ED Triage Vitals  Encounter Vitals Group     BP 10/24/22 1048 134/74     Systolic BP Percentile --      Diastolic BP Percentile --      Pulse Rate 10/24/22 1048 73     Resp 10/24/22 1048 20     Temp 10/24/22 1051 98.1 F (36.7 C)     Temp Source 10/24/22 1051 Oral     SpO2 10/24/22 1048 96 %     Weight 10/24/22 1045 160 lb (72.6 kg)     Height 10/24/22 1045 5\' 4"  (1.626 m)     Head Circumference --      Peak Flow --      Pain Score 10/24/22 1045 10     Pain Loc --      Pain Education --      Exclude from Growth Chart --     Most recent vital signs: Vitals:   10/24/22 1048 10/24/22 1051  BP: 134/74   Pulse: 73   Resp: 20   Temp:  98.1 F (36.7 C)  SpO2: 96%      Constitutional: Alert  Eyes: Conjunctivae are normal.  Head: Atraumatic. Nose: No congestion/rhinnorhea. Mouth/Throat: Mucous membranes are moist.   Neck: Painless ROM.  Cardiovascular:   Good peripheral circulation. Respiratory: Normal respiratory effort.  No retractions.  Gastrointestinal: Soft and nontender.  Musculoskeletal:  no deformity, no swelling or edema. Neurologic:  MAE spontaneously. No gross focal neurologic deficits are appreciated.  Skin:  Skin is warm, dry and intact. No rash noted. Psychiatric: Mood and affect are normal. Speech and behavior are normal.    ED  Results / Procedures / Treatments   Labs (all labs ordered are listed, but only abnormal results are displayed) Labs Reviewed  URINALYSIS, ROUTINE W REFLEX MICROSCOPIC - Abnormal; Notable for the following components:      Result Value   Color, Urine YELLOW (*)    APPearance CLEAR (*)    Glucose, UA >=500 (*)    All other components within normal limits  BASIC METABOLIC PANEL - Abnormal; Notable for the following components:   Potassium 3.4 (*)    Glucose, Bld 117 (*)    All other components within normal limits  CBC - Abnormal; Notable for the following components:   MCV 77.0 (*)    MCH 24.0 (*)    RDW 16.0 (*)    All other components within normal limits     EKG     RADIOLOGY Please see ED Course for my review and interpretation.  I personally reviewed all radiographic images ordered to evaluate for the above acute complaints and reviewed radiology reports and findings.  These findings were personally discussed with the patient.  Please see medical record for radiology report.    PROCEDURES:  Critical Care performed: No  Procedures   MEDICATIONS ORDERED IN ED: Medications - No data to display   IMPRESSION / MDM / ASSESSMENT AND PLAN / ED COURSE  I reviewed the triage vital signs and the nursing notes.                              Differential diagnosis includes, but is not limited to, kidney stone, pyelonephritis, UTI, colitis, diverticulitis, AAA, musculoskeletal strain, shingles  Patient presenting to the ER for evaluation of symptoms as described above.  Based on symptoms, risk factors and considered above differential, this presenting complaint could reflect a potentially life-threatening illness therefore the patient will be placed on continuous pulse oximetry and telemetry for monitoring.  Laboratory evaluation will be sent to evaluate for the above complaints.  She is currently pain-free.  Declining any pain medication.  Will order CT to evaluate for the  but differential.   Clinical Course as of 10/24/22 1229  Sat Oct 24, 2022  1202 CT imaging my review and interpretation without evidence of stone or AAA.  Will await formal radiology report [PR]  1227 Patient reassessed.  Remains pain-free.  CT imaging without evidence of acute abnormality.  At this point does appear stable and appropriate for outpatient follow-up. [PR]    Clinical Course User Index [PR] Willy Eddy, MD     FINAL CLINICAL IMPRESSION(S) / ED DIAGNOSES   Final diagnoses:  Left flank pain     Rx / DC Orders   ED Discharge Orders     None        Note:  This document was prepared using Dragon voice recognition software and may include unintentional dictation errors.    Willy Eddy, MD 10/24/22 1229

## 2022-10-24 NOTE — ED Triage Notes (Signed)
Pt reports pain to her left flank that radiates around and down to her left groin. Pt reports some urinary frequency as well. Pt states she was not able to sleep last pm due to the pain.

## 2022-11-02 ENCOUNTER — Ambulatory Visit (INDEPENDENT_AMBULATORY_CARE_PROVIDER_SITE_OTHER): Payer: Medicaid Other | Admitting: Internal Medicine

## 2022-11-02 ENCOUNTER — Encounter: Payer: Self-pay | Admitting: Internal Medicine

## 2022-11-02 VITALS — BP 96/58 | HR 62 | Ht 64.0 in | Wt 165.8 lb

## 2022-11-02 DIAGNOSIS — I10 Essential (primary) hypertension: Secondary | ICD-10-CM

## 2022-11-02 DIAGNOSIS — E782 Mixed hyperlipidemia: Secondary | ICD-10-CM | POA: Diagnosis not present

## 2022-11-02 DIAGNOSIS — K219 Gastro-esophageal reflux disease without esophagitis: Secondary | ICD-10-CM | POA: Diagnosis not present

## 2022-11-02 DIAGNOSIS — E1165 Type 2 diabetes mellitus with hyperglycemia: Secondary | ICD-10-CM

## 2022-11-02 LAB — POCT CBG (FASTING - GLUCOSE)-MANUAL ENTRY: Glucose Fasting, POC: 180 mg/dL — AB (ref 70–99)

## 2022-11-02 MED ORDER — PANTOPRAZOLE SODIUM 40 MG PO TBEC
40.0000 mg | DELAYED_RELEASE_TABLET | Freq: Every day | ORAL | 1 refills | Status: DC
Start: 2022-11-02 — End: 2022-11-19

## 2022-11-02 NOTE — Progress Notes (Signed)
Established Patient Office Visit  Subjective:  Patient ID: Helen Mccormick, female    DOB: 1941-01-16  Age: 82 y.o. MRN: [id number]  Chief Complaint  Patient presents with   Follow-up    2 month follow up    Patient is accompanied by a family member today.  She reports of some midepigastric discomfort and has been taking Pepto-Bismol frequently.  She is not sure if she is taking her PPI.  Will send in a new prescription for pantoprazole. Patient mentions that she is passing soft to liquid black-colored stools.  She is not taking any iron supplements, but she is taking her Pepto-Bismol which she was advised to stop now.  Patient will also take home Hemoccult cards to return at her next follow-up. Will check labs today. Her blood pressure is low today, but she has already cut back on her Entresto.  Will continue to monitor it at home.    No other concerns at this time.   Past Medical History:  Diagnosis Date   Allergy    Anxiety    CHF (congestive heart failure) (HCC)    Colon polyp    Depression    GERD (gastroesophageal reflux disease)    Heart murmur    Hyperlipidemia    Hypertension    Hypothyroidism    Sleep apnea    Thyroid disease     Past Surgical History:  Procedure Laterality Date   ABDOMINAL HYSTERECTOMY     BACK SURGERY     BACK SURGERY     CERVICAL FUSION     COLONOSCOPY WITH PROPOFOL N/A 05/28/2017   Procedure: COLONOSCOPY WITH PROPOFOL;  Surgeon: Wyline Mood, MD;  Location: [facility] ENDOSCOPY;  Service: Gastroenterology;  Laterality: N/A;   COLONOSCOPY WITH PROPOFOL N/A 11/17/2019   Procedure: COLONOSCOPY WITH PROPOFOL;  Surgeon: Regis Bill, MD;  Location: [facility] ENDOSCOPY;  Service: Endoscopy;  Laterality: N/A;   ESOPHAGOGASTRODUODENOSCOPY (EGD) WITH PROPOFOL N/A 11/17/2019   Procedure: ESOPHAGOGASTRODUODENOSCOPY (EGD) WITH PROPOFOL;  Surgeon: Regis Bill, MD;  Location: [facility] ENDOSCOPY;  Service: Endoscopy;  Laterality: N/A;   IR KYPHO  THORACIC WITH BONE BIOPSY  10/20/2022   IR RADIOLOGIST EVAL & MGMT  09/22/2022   NECK SURGERY     THYROID SURGERY     THYROID SURGERY      Social History   Socioeconomic History   Marital status: Single    Spouse name: Not on file   Number of children: Not on file   Years of education: Not on file   Highest education level: 10th grade  Occupational History   Occupation: retired  Tobacco Use   Smoking status: Never   Smokeless tobacco: Former    Types: Associate Professor status: Never Used  Substance and Sexual Activity   Alcohol use: No   Drug use: No   Sexual activity: Not Currently  Other Topics Concern   Not on file  Social History Narrative   Not on file   Social Determinants of Health   Financial Resource Strain: Low Risk  (04/27/2017)   Overall Financial Resource Strain (CARDIA)    Difficulty of Paying Living Expenses: Not hard at all  Food Insecurity: No Food Insecurity (04/27/2017)   Hunger Vital Sign    Worried About Running Out of Food in the Last Year: Never true    Ran Out of Food in the Last Year: Never true  Transportation Needs: No Transportation Needs (04/27/2017)   PRAPARE - Transportation  Lack of Transportation (Medical): No    Lack of Transportation (Non-Medical): No  Physical Activity: Sufficiently Active (12/20/2018)   Exercise Vital Sign    Days of Exercise per Week: 4 days    Minutes of Exercise per Session: 60 min  Stress: No Stress Concern Present (03/12/2017)   [employer] of Occupational Health - Occupational Stress Questionnaire    Feeling of Stress : Only a little  Social Connections: Moderately Integrated (04/27/2017)   Social Connection and Isolation Panel [NHANES]    Frequency of Communication with Friends and Family: More than three times a week    Frequency of Social Gatherings with Friends and Family: Once a week    Attends Religious Services: More than 4 times per year    Active Member of [employer] or Organizations: Yes     Attends Banker Meetings: More than 4 times per year    Marital Status: Never married  Intimate Partner Violence: Unknown (03/12/2017)   Humiliation, Afraid, Rape, and Kick questionnaire    Fear of Current or Ex-Partner: Patient declined    Emotionally Abused: Patient declined    Physically Abused: Patient declined    Sexually Abused: Patient declined    Family History  Problem Relation Age of Onset   Breast cancer Maternal Aunt 49   Diabetes Mother    Leukemia Mother    Heart Problems Mother    Diabetes Sister    Mental illness Sister    Colon polyps Sister    Stroke Father    Cancer Brother        rectal   Healthy Daughter    Hypertension Son    Diabetes Sister    Thyroid disease Sister    Healthy Sister    Healthy Sister    Stroke Brother    Healthy Son    Healthy Son    Healthy Daughter     Allergies  Allergen Reactions   Amlodipine Hives and Swelling   Tramadol Other (See Comments)    Dizziness    Isosorbide Dinitrate     Other Reaction(s): Headache   Codeine Nausea Only   Etodolac Other (See Comments)    Other reaction(s): Abdominal Pain    Review of Systems  Constitutional: Negative.  Negative for chills, diaphoresis, fever, malaise/fatigue and weight loss.  HENT: Negative.  Negative for hearing loss and sore throat.   Eyes: Negative.   Respiratory: Negative.  Negative for cough and shortness of breath.   Cardiovascular: Negative.  Negative for chest pain, palpitations and leg swelling.  Gastrointestinal:  Positive for heartburn and melena. Negative for abdominal pain, blood in stool, constipation, diarrhea, nausea and vomiting.  Genitourinary: Negative.  Negative for dysuria and flank pain.  Musculoskeletal: Negative.  Negative for joint pain and myalgias.  Skin: Negative.   Neurological: Negative.  Negative for dizziness and headaches.  Endo/Heme/Allergies: Negative.   Psychiatric/Behavioral: Negative.  Negative for depression and  suicidal ideas. The patient is not nervous/anxious.        Objective:   BP (!) 96/58   Pulse 62   Ht 5\' 4"  (1.626 m)   Wt 165 lb 12.8 oz (75.2 kg)   LMP  (LMP Unknown)   SpO2 95%   BMI 28.46 kg/m   Vitals:   11/02/22 0939  BP: (!) 96/58  Pulse: 62  Height: 5\' 4"  (1.626 m)  Weight: 165 lb 12.8 oz (75.2 kg)  SpO2: 95%  BMI (Calculated): 28.45    Physical Exam Vitals and nursing  note reviewed.  Constitutional:      Appearance: Normal appearance.  HENT:     Head: Normocephalic and atraumatic.     Nose: Nose normal.     Mouth/Throat:     Mouth: Mucous membranes are moist.     Pharynx: Oropharynx is clear.  Eyes:     Conjunctiva/sclera: Conjunctivae normal.     Pupils: Pupils are equal, round, and reactive to light.  Cardiovascular:     Rate and Rhythm: Normal rate and regular rhythm.     Pulses: Normal pulses.     Heart sounds: Normal heart sounds. No murmur heard. Pulmonary:     Effort: Pulmonary effort is normal.     Breath sounds: Normal breath sounds. No wheezing.  Abdominal:     General: Bowel sounds are normal. There is no distension.     Palpations: Abdomen is soft. There is no mass.     Tenderness: There is no abdominal tenderness. There is no right CVA tenderness or left CVA tenderness.     Hernia: No hernia is present.  Musculoskeletal:        General: Normal range of motion.     Cervical back: Normal range of motion.     Right lower leg: No edema.     Left lower leg: No edema.  Skin:    General: Skin is warm and dry.  Neurological:     General: No focal deficit present.     Mental Status: She is alert and oriented to person, place, and time.  Psychiatric:        Mood and Affect: Mood normal.        Behavior: Behavior normal.      Results for orders placed or performed in visit on 11/02/22  POCT CBG (Fasting - Glucose)  Result Value Ref Range   Glucose Fasting, POC 180 (A) 70 - 99 mg/dL        Assessment & Plan:  Start Protonix.   Check labs and Hemoccult cards. Monitor blood pressure. Problem List Items Addressed This Visit     DM (diabetes mellitus), type 2 (HCC) - Primary   Relevant Orders   POCT CBG (Fasting - Glucose) (Completed)   Hemoglobin A1c   Essential hypertension   Relevant Orders   CMP14+EGFR   Mixed hyperlipidemia   Relevant Orders   Lipid Panel w/o Chol/HDL Ratio   Other Visit Diagnoses     Gastroesophageal reflux disease without esophagitis       Relevant Medications   pantoprazole (PROTONIX) 40 MG tablet   Other Relevant Orders   CBC With Differential       Return in about 1 week (around 11/09/2022).   Total time spent: 30 minutes  [name], MD  11/02/2022   This document may have been prepared by [facility] Voice Recognition software and as such may include unintentional dictation errors.

## 2022-11-03 ENCOUNTER — Other Ambulatory Visit: Payer: Self-pay | Admitting: Internal Medicine

## 2022-11-03 DIAGNOSIS — D500 Iron deficiency anemia secondary to blood loss (chronic): Secondary | ICD-10-CM

## 2022-11-03 DIAGNOSIS — K921 Melena: Secondary | ICD-10-CM

## 2022-11-03 LAB — CBC WITH DIFFERENTIAL/PLATELET
Basophils Absolute: 0 10*3/uL (ref 0.0–0.2)
Basos: 0 %
EOS (ABSOLUTE): 0.1 10*3/uL (ref 0.0–0.4)
Eos: 3 %
Hematocrit: 35.5 % (ref 34.0–46.6)
Hemoglobin: 10.8 g/dL — ABNORMAL LOW (ref 11.1–15.9)
Immature Grans (Abs): 0 10*3/uL (ref 0.0–0.1)
Immature Granulocytes: 0 %
Lymphocytes Absolute: 2.1 10*3/uL (ref 0.7–3.1)
Lymphs: 48 %
MCH: 24.1 pg — ABNORMAL LOW (ref 26.6–33.0)
MCHC: 30.4 g/dL — ABNORMAL LOW (ref 31.5–35.7)
MCV: 79 fL (ref 79–97)
Monocytes Absolute: 0.4 10*3/uL (ref 0.1–0.9)
Monocytes: 8 %
Neutrophils Absolute: 1.8 10*3/uL (ref 1.4–7.0)
Neutrophils: 41 %
Platelets: 234 10*3/uL (ref 150–450)
RBC: 4.48 x10E6/uL (ref 3.77–5.28)
RDW: 16.7 % — ABNORMAL HIGH (ref 11.7–15.4)
WBC: 4.5 10*3/uL (ref 3.4–10.8)

## 2022-11-03 LAB — CMP14+EGFR
ALT: 16 [IU]/L (ref 0–32)
AST: 24 [IU]/L (ref 0–40)
Albumin: 4.2 g/dL (ref 3.8–4.8)
Alkaline Phosphatase: 121 [IU]/L (ref 44–121)
BUN/Creatinine Ratio: 24 (ref 12–28)
BUN: 23 mg/dL (ref 8–27)
Bilirubin Total: 0.3 mg/dL (ref 0.0–1.2)
CO2: 25 mmol/L (ref 20–29)
Calcium: 8.6 mg/dL — ABNORMAL LOW (ref 8.7–10.3)
Chloride: 106 mmol/L (ref 96–106)
Creatinine, Ser: 0.97 mg/dL (ref 0.57–1.00)
Globulin, Total: 2.4 g/dL (ref 1.5–4.5)
Glucose: 136 mg/dL — ABNORMAL HIGH (ref 70–99)
Potassium: 3.9 mmol/L (ref 3.5–5.2)
Sodium: 144 mmol/L (ref 134–144)
Total Protein: 6.6 g/dL (ref 6.0–8.5)
eGFR: 59 mL/min/{1.73_m2} — ABNORMAL LOW (ref >59)

## 2022-11-03 LAB — LIPID PANEL W/O CHOL/HDL RATIO
Cholesterol, Total: 113 mg/dL (ref 100–199)
HDL: 39 mg/dL — ABNORMAL LOW (ref >39)
LDL Chol Calc (NIH): 57 mg/dL (ref 0–99)
Triglycerides: 87 mg/dL (ref 0–149)
VLDL Cholesterol Cal: 17 mg/dL (ref 5–40)

## 2022-11-03 LAB — HEMOGLOBIN A1C
Est. average glucose Bld gHb Est-mCnc: 146 mg/dL
Hgb A1c MFr Bld: 6.7 % — ABNORMAL HIGH (ref 4.8–5.6)

## 2022-11-04 ENCOUNTER — Encounter: Payer: Self-pay | Admitting: Internal Medicine

## 2022-11-04 NOTE — Progress Notes (Signed)
Patient notified

## 2022-11-06 ENCOUNTER — Encounter: Payer: Self-pay | Admitting: Internal Medicine

## 2022-11-06 ENCOUNTER — Ambulatory Visit (INDEPENDENT_AMBULATORY_CARE_PROVIDER_SITE_OTHER): Payer: Medicaid Other | Admitting: Internal Medicine

## 2022-11-06 VITALS — BP 150/70 | HR 62 | Ht 64.0 in | Wt 166.0 lb

## 2022-11-06 DIAGNOSIS — D500 Iron deficiency anemia secondary to blood loss (chronic): Secondary | ICD-10-CM | POA: Diagnosis not present

## 2022-11-06 DIAGNOSIS — E782 Mixed hyperlipidemia: Secondary | ICD-10-CM | POA: Diagnosis not present

## 2022-11-06 DIAGNOSIS — K921 Melena: Secondary | ICD-10-CM | POA: Diagnosis not present

## 2022-11-06 DIAGNOSIS — E1165 Type 2 diabetes mellitus with hyperglycemia: Secondary | ICD-10-CM | POA: Diagnosis not present

## 2022-11-06 DIAGNOSIS — I10 Essential (primary) hypertension: Secondary | ICD-10-CM

## 2022-11-06 LAB — POCT CBG (FASTING - GLUCOSE)-MANUAL ENTRY: Glucose Fasting, POC: 93 mg/dL (ref 70–99)

## 2022-11-06 NOTE — Progress Notes (Signed)
Established Patient Office Visit  Subjective:  Patient ID: Helen Mccormick, female    DOB: 1940/06/16  Age: 82 y.o. MRN: [id number]  Chief Complaint  Patient presents with   Acute Visit    Black stools, fatigue, weakness.    Patient comes in today accompanied by her son.  He has a few questions as to what kind of diet she should be eating.  Patient was in recently and had complained of black stools.  Her blood work showed a drop in her H&H.  She was also having some midepigastric discomfort and was started on a PPI.  An appointment with a GI has been requested.  Today she reports that her black stools have eased up and the color has become more lighter.  She has not completed her Hemoccult cards yet. She is not complaining of any epigastric pain, no nausea vomiting or diarrhea.  But she understands she has to maintain a bland low-fat low spice diet until she gets evaluated by the GI.    No other concerns at this time.   Past Medical History:  Diagnosis Date   Allergy    Anxiety    CHF (congestive heart failure) (HCC)    Colon polyp    Depression    GERD (gastroesophageal reflux disease)    Heart murmur    Hyperlipidemia    Hypertension    Hypothyroidism    Sleep apnea    Thyroid disease     Past Surgical History:  Procedure Laterality Date   ABDOMINAL HYSTERECTOMY     BACK SURGERY     BACK SURGERY     CERVICAL FUSION     COLONOSCOPY WITH PROPOFOL N/A 05/28/2017   Procedure: COLONOSCOPY WITH PROPOFOL;  Surgeon: Wyline Mood, MD;  Location: [facility] ENDOSCOPY;  Service: Gastroenterology;  Laterality: N/A;   COLONOSCOPY WITH PROPOFOL N/A 11/17/2019   Procedure: COLONOSCOPY WITH PROPOFOL;  Surgeon: Regis Bill, MD;  Location: [facility] ENDOSCOPY;  Service: Endoscopy;  Laterality: N/A;   ESOPHAGOGASTRODUODENOSCOPY (EGD) WITH PROPOFOL N/A 11/17/2019   Procedure: ESOPHAGOGASTRODUODENOSCOPY (EGD) WITH PROPOFOL;  Surgeon: Regis Bill, MD;  Location: [facility] ENDOSCOPY;   Service: Endoscopy;  Laterality: N/A;   IR KYPHO THORACIC WITH BONE BIOPSY  10/20/2022   IR RADIOLOGIST EVAL & MGMT  09/22/2022   NECK SURGERY     THYROID SURGERY     THYROID SURGERY      Social History   Socioeconomic History   Marital status: Single    Spouse name: Not on file   Number of children: Not on file   Years of education: Not on file   Highest education level: 10th grade  Occupational History   Occupation: retired  Tobacco Use   Smoking status: Never   Smokeless tobacco: Former    Types: Associate Professor status: Never Used  Substance and Sexual Activity   Alcohol use: No   Drug use: No   Sexual activity: Not Currently  Other Topics Concern   Not on file  Social History Narrative   Not on file   Social Determinants of Health   Financial Resource Strain: Low Risk  (04/27/2017)   Overall Financial Resource Strain (CARDIA)    Difficulty of Paying Living Expenses: Not hard at all  Food Insecurity: No Food Insecurity (04/27/2017)   Hunger Vital Sign    Worried About Running Out of Food in the Last Year: Never true    Ran Out of Food in the Last  Year: Never true  Transportation Needs: No Transportation Needs (04/27/2017)   PRAPARE - Administrator, Civil Service (Medical): No    Lack of Transportation (Non-Medical): No  Physical Activity: Sufficiently Active (12/20/2018)   Exercise Vital Sign    Days of Exercise per Week: 4 days    Minutes of Exercise per Session: 60 min  Stress: No Stress Concern Present (03/12/2017)   [employer] of Occupational Health - Occupational Stress Questionnaire    Feeling of Stress : Only a little  Social Connections: Moderately Integrated (04/27/2017)   Social Connection and Isolation Panel [NHANES]    Frequency of Communication with Friends and Family: More than three times a week    Frequency of Social Gatherings with Friends and Family: Once a week    Attends Religious Services: More than 4 times per  year    Active Member of [employer] or Organizations: Yes    Attends Banker Meetings: More than 4 times per year    Marital Status: Never married  Intimate Partner Violence: Unknown (03/12/2017)   Humiliation, Afraid, Rape, and Kick questionnaire    Fear of Current or Ex-Partner: Patient declined    Emotionally Abused: Patient declined    Physically Abused: Patient declined    Sexually Abused: Patient declined    Family History  Problem Relation Age of Onset   Breast cancer Maternal Aunt 34   Diabetes Mother    Leukemia Mother    Heart Problems Mother    Diabetes Sister    Mental illness Sister    Colon polyps Sister    Stroke Father    Cancer Brother        rectal   Healthy Daughter    Hypertension Son    Diabetes Sister    Thyroid disease Sister    Healthy Sister    Healthy Sister    Stroke Brother    Healthy Son    Healthy Son    Healthy Daughter     Allergies  Allergen Reactions   Amlodipine Hives and Swelling   Tramadol Other (See Comments)    Dizziness    Isosorbide Dinitrate     Other Reaction(s): Headache   Codeine Nausea Only   Etodolac Other (See Comments)    Other reaction(s): Abdominal Pain    Review of Systems  Constitutional: Negative.   HENT: Negative.    Eyes: Negative.   Respiratory: Negative.  Negative for cough and shortness of breath.   Cardiovascular: Negative.  Negative for chest pain, palpitations and leg swelling.  Gastrointestinal:  Positive for melena. Negative for abdominal pain, blood in stool, constipation, diarrhea, heartburn, nausea and vomiting.  Genitourinary: Negative.  Negative for dysuria and flank pain.  Musculoskeletal: Negative.  Negative for joint pain and myalgias.  Skin: Negative.   Neurological: Negative.  Negative for dizziness and headaches.  Endo/Heme/Allergies: Negative.   Psychiatric/Behavioral: Negative.  Negative for depression and suicidal ideas. The patient is not nervous/anxious.         Objective:   BP (!) 150/70   Pulse 62   Ht 5\' 4"  (1.626 m)   Wt 166 lb (75.3 kg)   LMP  (LMP Unknown)   SpO2 96%   BMI 28.49 kg/m   Vitals:   11/06/22 1514  BP: (!) 150/70  Pulse: 62  Height: 5\' 4"  (1.626 m)  Weight: 166 lb (75.3 kg)  SpO2: 96%  BMI (Calculated): 28.48    Physical Exam Vitals and nursing note reviewed.  Constitutional:      Appearance: Normal appearance.  HENT:     Head: Normocephalic and atraumatic.     Nose: Nose normal.     Mouth/Throat:     Mouth: Mucous membranes are moist.     Pharynx: Oropharynx is clear.  Eyes:     Conjunctiva/sclera: Conjunctivae normal.     Pupils: Pupils are equal, round, and reactive to light.  Cardiovascular:     Rate and Rhythm: Normal rate and regular rhythm.     Pulses: Normal pulses.     Heart sounds: Normal heart sounds. No murmur heard. Pulmonary:     Effort: Pulmonary effort is normal.     Breath sounds: Normal breath sounds. No wheezing.  Abdominal:     General: Bowel sounds are normal.     Palpations: Abdomen is soft.     Tenderness: There is no abdominal tenderness. There is no right CVA tenderness or left CVA tenderness.  Musculoskeletal:        General: Normal range of motion.     Cervical back: Normal range of motion.     Right lower leg: No edema.     Left lower leg: No edema.  Skin:    General: Skin is warm and dry.  Neurological:     General: No focal deficit present.     Mental Status: She is alert and oriented to person, place, and time.  Psychiatric:        Mood and Affect: Mood normal.        Behavior: Behavior normal.      Results for orders placed or performed in visit on 11/06/22  POCT CBG (Fasting - Glucose)  Result Value Ref Range   Glucose Fasting, POC 93 70 - 99 mg/dL    Recent Results (from the past 2160 hour(s))  POCT CBG (Fasting - Glucose)     Status: Abnormal   Collection Time: 09/01/22 10:04 AM  Result Value Ref Range   Glucose Fasting, POC 137 (A) 70 - 99 mg/dL   CBC With Differential     Status: Abnormal   Collection Time: 09/01/22 10:50 AM  Result Value Ref Range   WBC 5.2 3.4 - 10.8 x10E3/uL   RBC 5.02 3.77 - 5.28 x10E6/uL   Hemoglobin 11.8 11.1 - 15.9 g/dL   Hematocrit 38.7 56.4 - 46.6 %   MCV 75 (L) 79 - 97 fL   MCH 23.5 (L) 26.6 - 33.0 pg   MCHC 31.4 (L) 31.5 - 35.7 g/dL   RDW 33.2 (H) 95.1 - 88.4 %   Neutrophils 39 Not Estab. %   Lymphs 50 Not Estab. %   Monocytes 7 Not Estab. %   Eos 4 Not Estab. %   Basos 0 Not Estab. %   Neutrophils Absolute 2.0 1.4 - 7.0 x10E3/uL   Lymphocytes Absolute 2.6 0.7 - 3.1 x10E3/uL   Monocytes Absolute 0.4 0.1 - 0.9 x10E3/uL   EOS (ABSOLUTE) 0.2 0.0 - 0.4 x10E3/uL   Basophils Absolute 0.0 0.0 - 0.2 x10E3/uL   Immature Granulocytes 0 Not Estab. %   Immature Grans (Abs) 0.0 0.0 - 0.1 x10E3/uL  CMP14+EGFR     Status: Abnormal   Collection Time: 09/01/22 10:50 AM  Result Value Ref Range   Glucose 129 (H) 70 - 99 mg/dL   BUN 27 8 - 27 mg/dL   Creatinine, Ser 1.66 0.57 - 1.00 mg/dL   eGFR 59 (L) >06 TK/ZSW/1.09   BUN/Creatinine Ratio 28 12 - 28  Sodium 143 134 - 144 mmol/L   Potassium 4.4 3.5 - 5.2 mmol/L   Chloride 107 (H) 96 - 106 mmol/L   CO2 24 20 - 29 mmol/L   Calcium 9.6 8.7 - 10.3 mg/dL   Total Protein 7.0 6.0 - 8.5 g/dL   Albumin 4.4 3.8 - 4.8 g/dL   Globulin, Total 2.6 1.5 - 4.5 g/dL   Albumin/Globulin Ratio 1.7 1.2 - 2.2   Bilirubin Total 0.3 0.0 - 1.2 mg/dL   Alkaline Phosphatase 107 44 - 121 IU/L   AST 22 0 - 40 IU/L   ALT 19 0 - 32 IU/L  TSH     Status: Abnormal   Collection Time: 09/01/22 10:50 AM  Result Value Ref Range   TSH 0.314 (L) 0.450 - 4.500 uIU/mL  Lipid Panel w/o Chol/HDL Ratio     Status: None   Collection Time: 09/01/22 10:50 AM  Result Value Ref Range   Cholesterol, Total 126 100 - 199 mg/dL   Triglycerides 77 0 - 149 mg/dL   HDL 40 >16 mg/dL   VLDL Cholesterol Cal 15 5 - 40 mg/dL   LDL Chol Calc (NIH) 71 0 - 99 mg/dL  Hemoglobin X0R     Status: Abnormal    Collection Time: 09/01/22 10:50 AM  Result Value Ref Range   Hgb A1c MFr Bld 7.0 (H) 4.8 - 5.6 %    Comment:          Prediabetes: 5.7 - 6.4          Diabetes: >6.4          Glycemic control for adults with diabetes: <7.0    Est. average glucose Bld gHb Est-mCnc 154 mg/dL  CBC upon arrival     Status: Abnormal   Collection Time: 10/20/22  9:15 AM  Result Value Ref Range   WBC 6.2 4.0 - 10.5 K/uL   RBC 5.19 (H) 3.87 - 5.11 MIL/uL   Hemoglobin 12.5 12.0 - 15.0 g/dL   HCT 60.4 54.0 - 98.1 %   MCV 77.3 (L) 80.0 - 100.0 fL   MCH 24.1 (L) 26.0 - 34.0 pg   MCHC 31.2 30.0 - 36.0 g/dL   RDW 19.1 (H) 47.8 - 29.5 %   Platelets 209 150 - 400 K/uL   nRBC 0.0 0.0 - 0.2 %    Comment: Performed at [facility], [address], [place], [place] [address]  Protime-INR     Status: None   Collection Time: 10/20/22  9:47 AM  Result Value Ref Range   Prothrombin Time 14.3 11.4 - 15.2 seconds   INR 1.1 0.8 - 1.2    Comment: (NOTE) INR goal varies based on device and disease states. Performed at [facility], [address] [place], [place] [address]   Protime-INR     Status: None   Collection Time: 10/20/22 10:19 AM  Result Value Ref Range   Prothrombin Time 14.4 11.4 - 15.2 seconds   INR 1.1 0.8 - 1.2    Comment: (NOTE) INR goal varies based on device and disease states. Performed at [facility], [address], [place], [place] [address]   Basic metabolic panel     Status: Abnormal   Collection Time: 10/24/22 10:51 AM  Result Value Ref Range   Sodium 140 135 - 145 mmol/L   Potassium 3.4 (L) 3.5 - 5.1 mmol/L   Chloride 105 98 - 111 mmol/L   CO2 26 22 - 32  mmol/L   Glucose, Bld 117 (H) 70 - 99 mg/dL    Comment: Glucose reference range applies only to samples taken after fasting for at least 8 hours.   BUN 18 8 - 23 mg/dL   Creatinine, Ser 6.44 0.44 - 1.00 mg/dL   Calcium 8.9 8.9 - 03.4 mg/dL   GFR, Estimated >74 >25 mL/min    Comment: (NOTE) Calculated using the  CKD-EPI Creatinine Equation (2021)    Anion gap 9 5 - 15    Comment: Performed at [facility], [address] [place], [place] [address]  CBC     Status: Abnormal   Collection Time: 10/24/22 10:51 AM  Result Value Ref Range   WBC 4.9 4.0 - 10.5 K/uL   RBC 5.05 3.87 - 5.11 MIL/uL   Hemoglobin 12.1 12.0 - 15.0 g/dL   HCT 75.6 43.3 - 29.5 %   MCV 77.0 (L) 80.0 - 100.0 fL   MCH 24.0 (L) 26.0 - 34.0 pg   MCHC 31.1 30.0 - 36.0 g/dL   RDW 18.8 (H) 41.6 - 60.6 %   Platelets 201 150 - 400 K/uL   nRBC 0.0 0.0 - 0.2 %    Comment: Performed at [facility], [address] [place], [place] [address]  Urinalysis, Routine w reflex microscopic -Urine, Clean Catch     Status: Abnormal   Collection Time: 10/24/22 11:36 AM  Result Value Ref Range   Color, Urine YELLOW (A) YELLOW   APPearance CLEAR (A) CLEAR   Specific Gravity, Urine 1.023 1.005 - 1.030   pH 6.0 5.0 - 8.0   Glucose, UA >=500 (A) NEGATIVE mg/dL   Hgb urine dipstick NEGATIVE NEGATIVE   Bilirubin Urine NEGATIVE NEGATIVE   Ketones, ur NEGATIVE NEGATIVE mg/dL   Protein, ur NEGATIVE NEGATIVE mg/dL   Nitrite NEGATIVE NEGATIVE   Leukocytes,Ua NEGATIVE NEGATIVE   RBC / HPF 0-5 0 - 5 RBC/hpf   WBC, UA 0-5 0 - 5 WBC/hpf   Bacteria, UA NONE SEEN NONE SEEN   Squamous Epithelial / HPF 0-5 0 - 5 /HPF   Mucus PRESENT     Comment: Performed at [facility], [address] [place], [place] [address]  POCT CBG (Fasting - Glucose)     Status: Abnormal   Collection Time: 11/02/22  9:45 AM  Result Value Ref Range   Glucose Fasting, POC 180 (A) 70 - 99 mg/dL  [id number]     Status: Abnormal   Collection Time: 11/02/22 10:29 AM  Result Value Ref Range   Glucose 136 (H) 70 - 99 mg/dL   BUN 23 8 - 27 mg/dL   Creatinine, Ser 5.42 0.57 - 1.00 mg/dL   eGFR 59 (L) >70 WC/BJS/2.83   BUN/Creatinine Ratio 24 12 - 28   Sodium 144 134 - 144 mmol/L   Potassium 3.9 3.5 - 5.2 mmol/L   Chloride 106 96 - 106 mmol/L   CO2  25 20 - 29 mmol/L   Calcium 8.6 (L) 8.7 - 10.3 mg/dL   Total Protein 6.6 6.0 - 8.5 g/dL   Albumin 4.2 3.8 - 4.8 g/dL   Globulin, Total 2.4 1.5 - 4.5 g/dL   Bilirubin Total 0.3 0.0 - 1.2 mg/dL   Alkaline Phosphatase 121 44 - 121 IU/L   AST 24 0 - 40 IU/L   ALT 16 0 - 32 IU/L  Hemoglobin A1c     Status: Abnormal   Collection Time: 11/02/22 10:29 AM  Result Value Ref Range  Hgb A1c MFr Bld 6.7 (H) 4.8 - 5.6 %    Comment:          Prediabetes: 5.7 - 6.4          Diabetes: >6.4          Glycemic control for adults with diabetes: <7.0    Est. average glucose Bld gHb Est-mCnc 146 mg/dL  Lipid Panel w/o Chol/HDL Ratio     Status: Abnormal   Collection Time: 11/02/22 10:29 AM  Result Value Ref Range   Cholesterol, Total 113 100 - 199 mg/dL   Triglycerides 87 0 - 149 mg/dL   HDL 39 (L) >78 mg/dL   VLDL Cholesterol Cal 17 5 - 40 mg/dL   LDL Chol Calc (NIH) 57 0 - 99 mg/dL  CBC with Differential/Platelet     Status: Abnormal   Collection Time: 11/02/22 10:29 AM  Result Value Ref Range   WBC 4.5 3.4 - 10.8 x10E3/uL   RBC 4.48 3.77 - 5.28 x10E6/uL   Hemoglobin 10.8 (L) 11.1 - 15.9 g/dL   Hematocrit 46.9 62.9 - 46.6 %   MCV 79 79 - 97 fL   MCH 24.1 (L) 26.6 - 33.0 pg   MCHC 30.4 (L) 31.5 - 35.7 g/dL   RDW 52.8 (H) 41.3 - 24.4 %   Platelets 234 150 - 450 x10E3/uL   Neutrophils 41 Not Estab. %   Lymphs 48 Not Estab. %   Monocytes 8 Not Estab. %   Eos 3 Not Estab. %   Basos 0 Not Estab. %   Neutrophils Absolute 1.8 1.4 - 7.0 x10E3/uL   Lymphocytes Absolute 2.1 0.7 - 3.1 x10E3/uL   Monocytes Absolute 0.4 0.1 - 0.9 x10E3/uL   EOS (ABSOLUTE) 0.1 0.0 - 0.4 x10E3/uL   Basophils Absolute 0.0 0.0 - 0.2 x10E3/uL   Immature Granulocytes 0 Not Estab. %   Immature Grans (Abs) 0.0 0.0 - 0.1 x10E3/uL  POCT CBG (Fasting - Glucose)     Status: None   Collection Time: 11/06/22  3:19 PM  Result Value Ref Range   Glucose Fasting, POC 93 70 - 99 mg/dL      Assessment & Plan:  Patient advised to  continue all her medications.  Her diet was discussed in detail.  She will let us know if her abdominal pain or melena gets worse.  Meanwhile wait for the GI evaluation. Problem List Items Addressed This Visit     DM (diabetes mellitus), type 2 (HCC) - Primary   Relevant Orders   POCT CBG (Fasting - Glucose) (Completed)   Essential hypertension, benign   Mixed hyperlipidemia   Iron deficiency anemia due to chronic blood loss   Melena    Return in about 6 weeks (around 12/18/2022).   Total time spent: 30 minutes  [name], MD  11/06/2022   This document may have been prepared by [facility] Voice Recognition software and as such may include unintentional dictation errors.

## 2022-11-09 ENCOUNTER — Telehealth: Payer: Self-pay

## 2022-11-09 ENCOUNTER — Ambulatory Visit: Payer: Medicaid Other | Admitting: Internal Medicine

## 2022-11-09 NOTE — Telephone Encounter (Signed)
Patient daughter in law left a message to find out if she can make or when the patient is scheduled for a appointment. She is schedule with [facility] but unable to give any information to her because there is no sign DPR form. Called her back and informed her I could not give any information about the patient due to HIPAA with out a sign DPR form or the patient giving me verbally permission to talk to you. She said okay she will try to call back with the patient

## 2022-11-09 NOTE — Telephone Encounter (Signed)
Patient son called in wanting to set schedule appt for his mother I informed him that he will have to call [facility] since she is currently schedule with them and they have to referral.

## 2022-11-10 ENCOUNTER — Ambulatory Visit: Payer: Medicaid Other | Admitting: Internal Medicine

## 2022-11-17 ENCOUNTER — Other Ambulatory Visit (INDEPENDENT_AMBULATORY_CARE_PROVIDER_SITE_OTHER): Payer: Medicaid Other

## 2022-11-17 DIAGNOSIS — D500 Iron deficiency anemia secondary to blood loss (chronic): Secondary | ICD-10-CM | POA: Diagnosis not present

## 2022-11-17 LAB — POC HEMOCCULT BLD/STL (HOME/3-CARD/SCREEN)
Card #2 Fecal Occult Blod, POC: POSITIVE
Card #3 Fecal Occult Blood, POC: POSITIVE
Fecal Occult Blood, POC: POSITIVE — AB

## 2022-11-18 ENCOUNTER — Other Ambulatory Visit: Payer: Self-pay | Admitting: Internal Medicine

## 2022-11-18 DIAGNOSIS — K219 Gastro-esophageal reflux disease without esophagitis: Secondary | ICD-10-CM

## 2022-11-20 ENCOUNTER — Encounter: Payer: Self-pay | Admitting: Cardiovascular Disease

## 2022-11-20 ENCOUNTER — Ambulatory Visit (INDEPENDENT_AMBULATORY_CARE_PROVIDER_SITE_OTHER): Payer: Medicaid Other | Admitting: Cardiovascular Disease

## 2022-11-20 VITALS — BP 154/80 | HR 64 | Ht 64.0 in | Wt 165.5 lb

## 2022-11-20 DIAGNOSIS — I25118 Atherosclerotic heart disease of native coronary artery with other forms of angina pectoris: Secondary | ICD-10-CM | POA: Diagnosis not present

## 2022-11-20 DIAGNOSIS — I10 Essential (primary) hypertension: Secondary | ICD-10-CM

## 2022-11-20 DIAGNOSIS — E119 Type 2 diabetes mellitus without complications: Secondary | ICD-10-CM | POA: Diagnosis not present

## 2022-11-20 DIAGNOSIS — I428 Other cardiomyopathies: Secondary | ICD-10-CM | POA: Diagnosis not present

## 2022-11-20 IMAGING — CR DG CHEST 2V
2 series · 2 of 2 positions shown · non-contrast
Comparison: Two-view chest x-ray 02/21/2021

CLINICAL DATA: Dizziness.

EXAM:
CHEST - 2 VIEW

[chest pa]
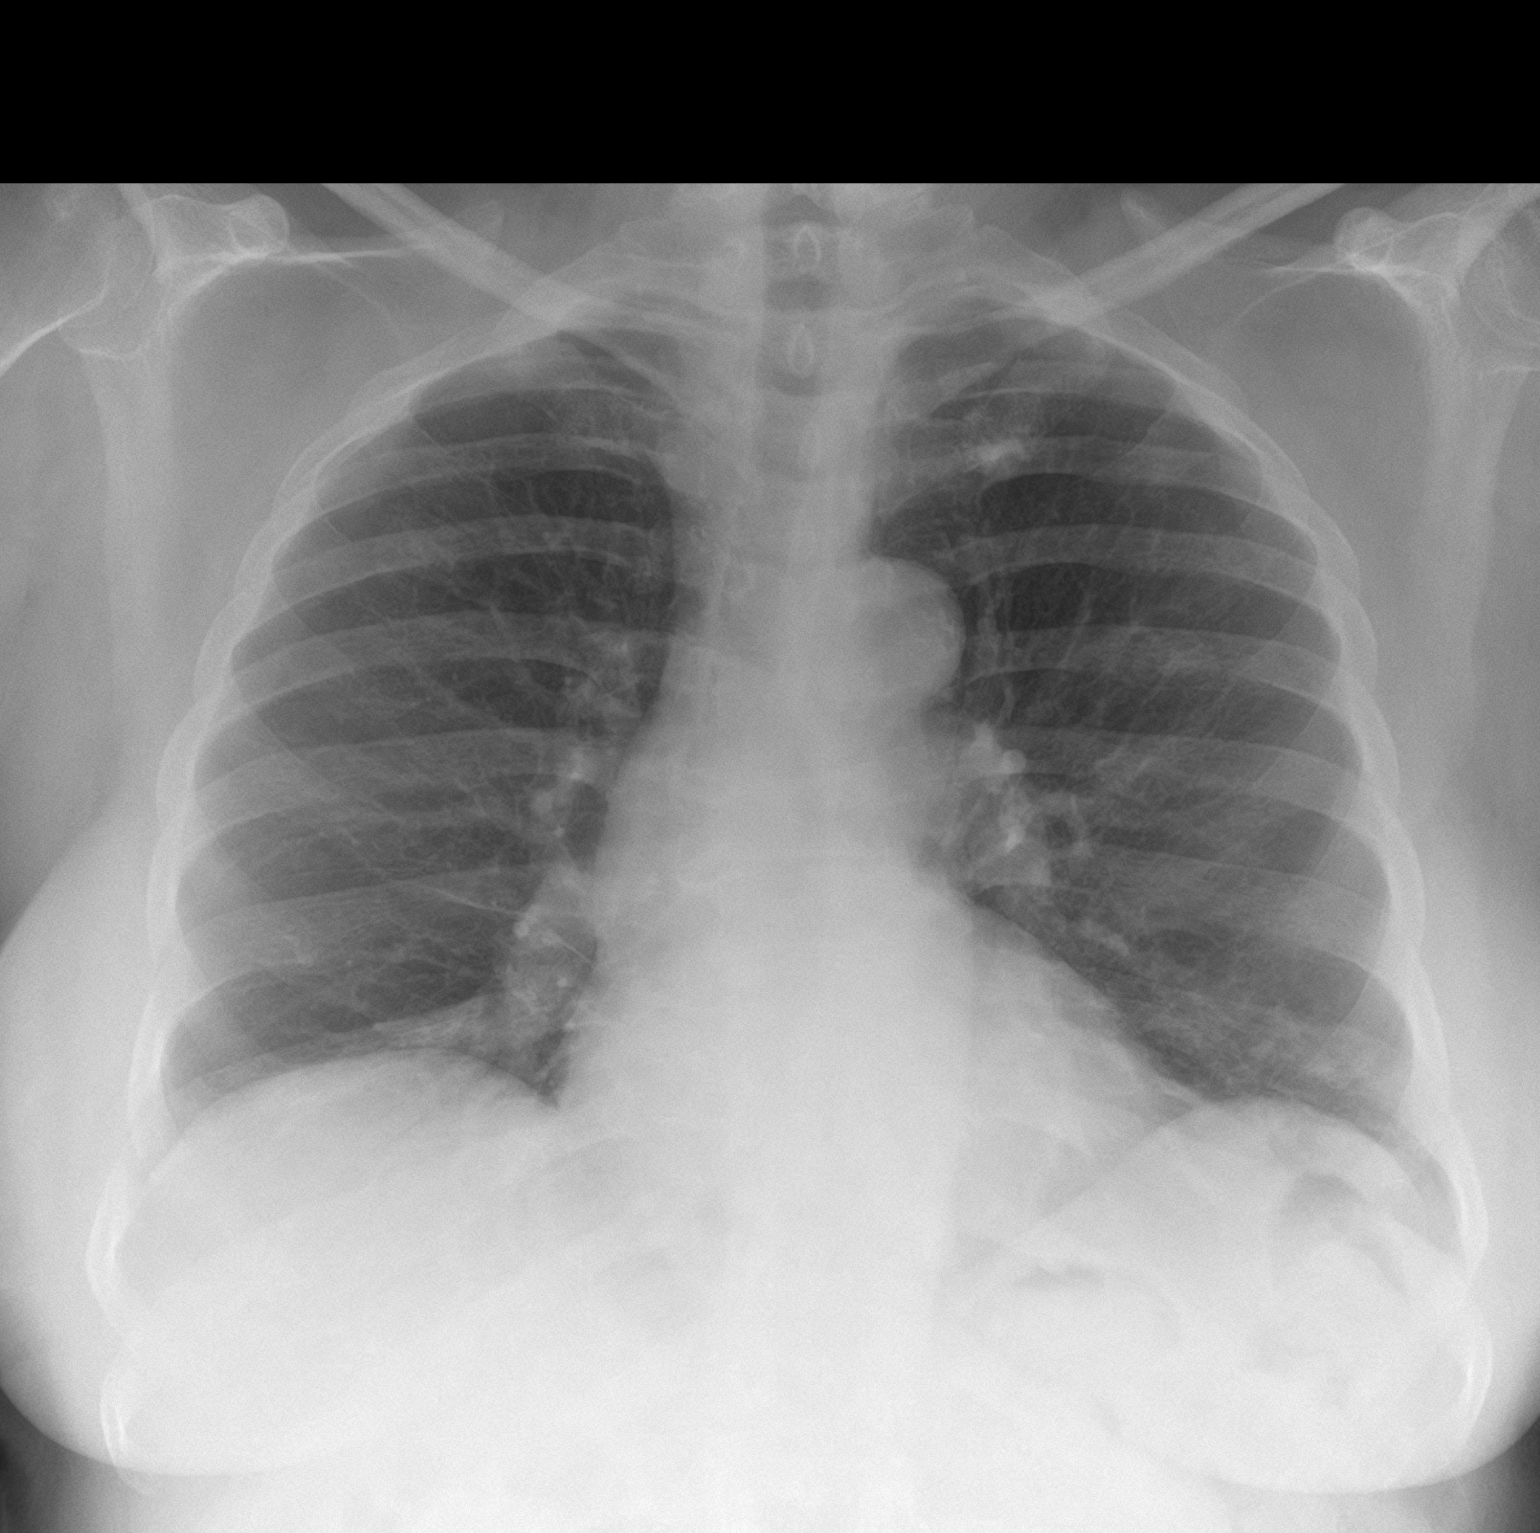

[chest lat]
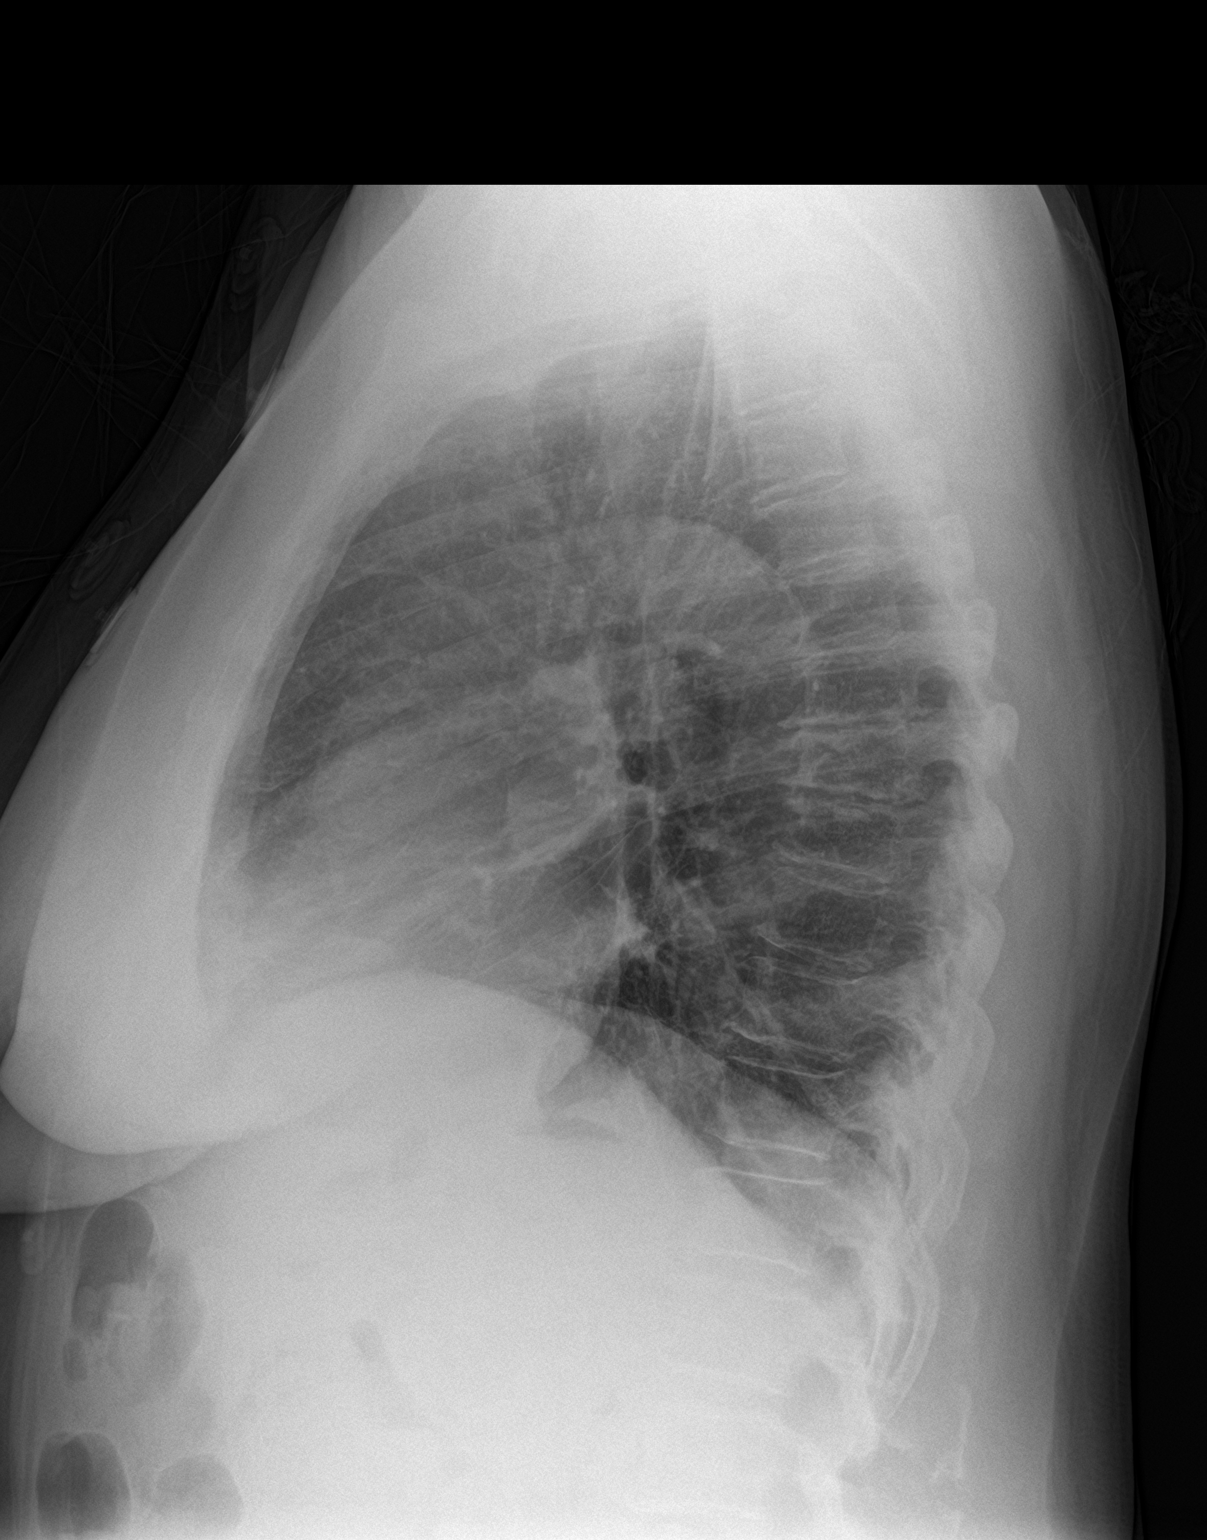

[2 of 2 positions shown; findings below may reference images not displayed]

FINDINGS: Heart size is normal. Lung volumes are low. No edema or effusion is
present. No focal airspace disease present. Degenerative changes of
the thoracic spine are within normal limits.
IMPRESSION: Low lung volumes.

## 2022-11-20 NOTE — Progress Notes (Signed)
Cardiology Office Note   Date:  11/20/2022   ID:  Helen Mccormick, Helen Mccormick 03/18/1941, MRN [id number]  PCP:  [name], MD  Cardiologist:  Adrian Blackwater, MD      History of Present Illness: Helen Mccormick is a 82 y.o. female who presents for  Chief Complaint  Patient presents with   Follow-up    Did not take meds as BP high.      Past Medical History:  Diagnosis Date   Allergy    Anxiety    CHF (congestive heart failure) (HCC)    Colon polyp    Depression    GERD (gastroesophageal reflux disease)    Heart murmur    Hyperlipidemia    Hypertension    Hypothyroidism    Sleep apnea    Thyroid disease      Past Surgical History:  Procedure Laterality Date   ABDOMINAL HYSTERECTOMY     BACK SURGERY     BACK SURGERY     CERVICAL FUSION     COLONOSCOPY WITH PROPOFOL N/A 05/28/2017   Procedure: COLONOSCOPY WITH PROPOFOL;  Surgeon: Wyline Mood, MD;  Location: [facility] ENDOSCOPY;  Service: Gastroenterology;  Laterality: N/A;   COLONOSCOPY WITH PROPOFOL N/A 11/17/2019   Procedure: COLONOSCOPY WITH PROPOFOL;  Surgeon: Regis Bill, MD;  Location: [facility] ENDOSCOPY;  Service: Endoscopy;  Laterality: N/A;   ESOPHAGOGASTRODUODENOSCOPY (EGD) WITH PROPOFOL N/A 11/17/2019   Procedure: ESOPHAGOGASTRODUODENOSCOPY (EGD) WITH PROPOFOL;  Surgeon: Regis Bill, MD;  Location: [facility] ENDOSCOPY;  Service: Endoscopy;  Laterality: N/A;   IR KYPHO THORACIC WITH BONE BIOPSY  10/20/2022   IR RADIOLOGIST EVAL & MGMT  09/22/2022   NECK SURGERY     THYROID SURGERY     THYROID SURGERY       Current Outpatient Medications  Medication Sig Dispense Refill   acetaminophen (TYLENOL) 650 MG CR tablet Take 650 mg by mouth every 8 (eight) hours as needed for pain.     albuterol (VENTOLIN HFA) 108 (90 Base) MCG/ACT inhaler Inhale 2 puffs into the lungs every 6 (six) hours as needed for wheezing or shortness of breath. 1 each 6   amLODipine (NORVASC) 5 MG tablet Take 1 tablet by mouth  every day 30 tablet 11   aspirin 81 MG chewable tablet Chew 81 mg by mouth daily.     Azelastine HCl 137 MCG/SPRAY SOLN SMARTSIG:1-2 Spray(s) Both Nares Twice Daily     b complex vitamins capsule Take 1 capsule by mouth daily.     Blood Glucose Monitoring Suppl (ACCU-CHEK GUIDE) w/Device KIT See admin instructions.     carvedilol (COREG) 12.5 MG tablet TAKE 1 TABLET BY MOUTH TWICE A DAY 180 tablet 2   cyclobenzaprine (FLEXERIL) 5 MG tablet Take 1 tablet (5 mg total) by mouth 3 (three) times daily as needed for muscle spasms. Be careful, this can make you sleepy. 30 tablet 0   empagliflozin (JARDIANCE) 25 MG TABS tablet Take 1 tablet (25 mg total) by mouth daily. 90 tablet 3   escitalopram (LEXAPRO) 10 MG tablet Take 1 tablet by mouth daily 30 tablet 11   FARXIGA 10 MG TABS tablet Take 10 mg by mouth daily.     fluticasone (FLOVENT HFA) 110 MCG/ACT inhaler Inhale 1 puff into the lungs 2 (two) times daily.     Fluticasone Furoate (ARNUITY ELLIPTA) 100 MCG/ACT AEPB Inhale 1 puff into the lungs daily. 30 each 6   Fluticasone Furoate (ARNUITY ELLIPTA) 100 MCG/ACT AEPB Inhale 1  puff into the lungs 2 (two) times daily. 30 each 3   furosemide (LASIX) 20 MG tablet TAKE 1 TABLET BY MOUTH MONDAY WEDNESDAY FRIDAY 30 tablet 0   gabapentin (NEURONTIN) 100 MG capsule Take 1 capsule by mouth 3 times a day 90 capsule 11   gabapentin (NEURONTIN) 300 MG capsule Take 1 capsule (300 mg total) by mouth 3 (three) times daily. 90 capsule 3   glucose blood test strip Use 1 strip to check your blood sugar two times daily. 50 each 12   isosorbide dinitrate (ISORDIL) 30 MG tablet Take 1 tablet by mouth daily 30 tablet 11   isosorbide mononitrate (IMDUR) 30 MG 24 hr tablet TAKE 1 TABLET(30 MG) BY MOUTH DAILY (Patient taking differently: Take 30 mg by mouth daily.) 30 tablet 2   levothyroxine (SYNTHROID) 75 MCG tablet Take 1 tablet by mouth daily 30 tablet 11   lidocaine (LIDODERM) 5 % Place 1 patch onto the skin every 12  (twelve) hours. Remove & Discard patch within 12 hours or as directed by MD 10 patch 0   meclizine (ANTIVERT) 12.5 MG tablet Take 1 tablet (12.5 mg total) by mouth 3 (three) times daily as needed for dizziness. 90 tablet 0   meloxicam (MOBIC) 15 MG tablet meloxicam 15 mg tablet  Take 1 tablet every day as needed for pain 30 tablet 0   nitroGLYCERIN (NITROSTAT) 0.4 MG SL tablet Place 1 tablet (0.4 mg total) under the tongue every 5 (five) minutes as needed for chest pain. 50 tablet 3   oxyCODONE (ROXICODONE) 5 MG immediate release tablet Take 1 tablet (5 mg total) by mouth every 8 (eight) hours as needed for severe pain. 15 tablet 0   pantoprazole (PROTONIX) 40 MG tablet Take one tablet by mouth daily 30 tablet 11   polyethylene glycol powder (GLYCOLAX/MIRALAX) 17 GM/SCOOP powder      RESTASIS 0.05 % ophthalmic emulsion Place 1 drop into both eyes 2 (two) times daily.     rosuvastatin (CRESTOR) 40 MG tablet Take 1 tablet by mouth every day 30 tablet 11   sacubitril-valsartan (ENTRESTO) 49-51 MG Take 1 tablet by mouth 2 (two) times daily. 60 tablet 3   No current facility-administered medications for this visit.    Allergies:   Amlodipine, Tramadol, Isosorbide dinitrate, Codeine, and Etodolac    Social History:   reports that she has never smoked. She has quit using smokeless tobacco.  Her smokeless tobacco use included chew. She reports that she does not drink alcohol and does not use drugs.   Family History:  family history includes Breast cancer (age of onset: 25) in her maternal aunt; Cancer in her brother; Colon polyps in her sister; Diabetes in her mother, sister, and sister; Healthy in her daughter, daughter, sister, sister, son, and son; Heart Problems in her mother; Hypertension in her son; Leukemia in her mother; Mental illness in her sister; Stroke in her brother and father; Thyroid disease in her sister.    ROS:     Review of Systems  Constitutional: Negative.   HENT: Negative.     Eyes: Negative.   Respiratory: Negative.    Gastrointestinal: Negative.   Genitourinary: Negative.   Musculoskeletal: Negative.   Skin: Negative.   Neurological: Negative.   Endo/Heme/Allergies: Negative.   Psychiatric/Behavioral: Negative.    All other systems reviewed and are negative.     All other systems are reviewed and negative.    PHYSICAL EXAM: VS:  BP (!) 154/80   Pulse 64  Ht 5\' 4"  (1.626 m)   Wt 165 lb 8 oz (75.1 kg)   LMP  (LMP Unknown)   SpO2 97%   BMI 28.41 kg/m  , BMI Body mass index is 28.41 kg/m. Last weight:  Wt Readings from Last 3 Encounters:  11/20/22 165 lb 8 oz (75.1 kg)  11/06/22 166 lb (75.3 kg)  11/02/22 165 lb 12.8 oz (75.2 kg)     Physical Exam Constitutional:      Appearance: Normal appearance.  Cardiovascular:     Rate and Rhythm: Normal rate and regular rhythm.     Heart sounds: Normal heart sounds.  Pulmonary:     Effort: Pulmonary effort is normal.     Breath sounds: Normal breath sounds.  Musculoskeletal:     Right lower leg: No edema.     Left lower leg: No edema.  Neurological:     Mental Status: She is alert.       EKG:   Recent Labs: 07/01/2022: Magnesium 2.2 09/01/2022: TSH 0.314 11/02/2022: ALT 16; BUN 23; Creatinine, Ser 0.97; Hemoglobin 10.8; Platelets 234; Potassium 3.9; Sodium 144    Lipid Panel    Component Value Date/Time   CHOL 113 11/02/2022 1029   TRIG 87 11/02/2022 1029   HDL 39 (L) 11/02/2022 1029   CHOLHDL 2.8 11/08/2018 0806   VLDL 23 08/16/2017 0528   LDLCALC 57 11/02/2022 1029   LDLCALC 60 11/08/2018 0806      Other studies Reviewed: Additional studies/ records that were reviewed today include:  Review of the above records demonstrates:      09/27/2019    8:55 AM  PAD Screen  Previous PAD dx? No  Previous surgical procedure? No  Pain with walking? No  Feet/toe relief with dangling? No  Painful, non-healing ulcers? No  Extremities discolored? Yes      ASSESSMENT AND PLAN:     ICD-10-CM   1. Coronary artery disease of native artery of native heart with stable angina pectoris (HCC)  I25.118     2. Essential hypertension, benign  I10    At home normal blood pressures    3. Nonischemic cardiomyopathy (HCC)  I42.8     4. Primary hypertension  I10     5. Type 2 diabetes mellitus without complication, without long-term current use of insulin (HCC)  E11.9        Problem List Items Addressed This Visit       Cardiovascular and Mediastinum   Hypertension   Nonischemic cardiomyopathy (HCC)   Essential hypertension, benign   Coronary artery disease of native artery of native heart with stable angina pectoris (HCC) - Primary     Endocrine   Type 2 diabetes mellitus without complications (HCC)       Disposition:   Return in about 3 months (around 02/20/2023).    Total time spent: 40 minutes  Signed,  Adrian Blackwater, MD  11/20/2022 10:02 AM    [facility]

## 2022-11-26 ENCOUNTER — Ambulatory Visit: Payer: Medicaid Other | Admitting: Internal Medicine

## 2022-11-27 ENCOUNTER — Ambulatory Visit: Payer: Medicaid Other | Admitting: Internal Medicine

## 2022-11-27 NOTE — Progress Notes (Unsigned)
Referring Physician:  No referring provider defined for this encounter.  Primary Physician:  [name], MD  History of Present Illness: 11/27/2022 [name] has a history of HTN, nonischemic cardiomyopathy, OSA, asthma, gastritis, DM, hyperlipidemia, hypothyroidism, and macular degeneration both eyes.   She had T12 kyphoplasty by IR on 10/20/22. She is here for follow up. This helped her mid back pain.   Now with 4- 6 weeks weeks of intermittent right LBP/buttock pain. No leg pain noted. No left sided pain. No numbness, tingling, or weakness. Sitting makes her pain worse. Pain is also worse with standing and walking. She is better with laying flat.   History of lumbar surgery years ago.   Also with 4 weeks of constant  neck pain with left arm pain. No right arm pain. She notes pain in her jaw when she opens wide. She has numbness, tingling, and weakness in her right arm.   History of cervical fusion 4 years ago- she got better after the surgery.    Conservative measures:  Physical therapy:  denies Multimodal medical therapy including regular antiinflammatories: oxycodone, flexeril, mobic  Injections: denies epidural steroid injections  Past Surgery:  T12 kyphoplasty by IR on 10/20/22 Cervical fusion 4 years ago.   [name] has no symptoms of cervical myelopathy.  The symptoms are causing a significant impact on the patient's life.   Review of Systems:  A 10 point review of systems is negative, except for the pertinent positives and negatives detailed in the HPI.  Past Medical History: Past Medical History:  Diagnosis Date   Allergy    Anxiety    CHF (congestive heart failure) (HCC)    Colon polyp    Depression    GERD (gastroesophageal reflux disease)    Heart murmur    Hyperlipidemia    Hypertension    Hypothyroidism    Sleep apnea    Thyroid disease     Past Surgical History: Past Surgical History:  Procedure Laterality Date   ABDOMINAL  HYSTERECTOMY     BACK SURGERY     BACK SURGERY     CERVICAL FUSION     COLONOSCOPY WITH PROPOFOL N/A 05/28/2017   Procedure: COLONOSCOPY WITH PROPOFOL;  Surgeon: Wyline Mood, MD;  Location: [facility] ENDOSCOPY;  Service: Gastroenterology;  Laterality: N/A;   COLONOSCOPY WITH PROPOFOL N/A 11/17/2019   Procedure: COLONOSCOPY WITH PROPOFOL;  Surgeon: Regis Bill, MD;  Location: [facility] ENDOSCOPY;  Service: Endoscopy;  Laterality: N/A;   ESOPHAGOGASTRODUODENOSCOPY (EGD) WITH PROPOFOL N/A 11/17/2019   Procedure: ESOPHAGOGASTRODUODENOSCOPY (EGD) WITH PROPOFOL;  Surgeon: Regis Bill, MD;  Location: [facility] ENDOSCOPY;  Service: Endoscopy;  Laterality: N/A;   IR KYPHO THORACIC WITH BONE BIOPSY  10/20/2022   IR RADIOLOGIST EVAL & MGMT  09/22/2022   NECK SURGERY     THYROID SURGERY     THYROID SURGERY      Allergies: Allergies as of 12/01/2022 - Review Complete 11/20/2022  Allergen Reaction Noted   Amlodipine Hives and Swelling 12/24/2014   Tramadol Other (See Comments) 06/14/2019   Isosorbide dinitrate  07/06/2018   Codeine Nausea Only 09/11/2020   Etodolac Other (See Comments) 04/22/2016    Medications: Outpatient Encounter Medications as of 12/01/2022  Medication Sig   acetaminophen (TYLENOL) 650 MG CR tablet Take 650 mg by mouth every 8 (eight) hours as needed for pain.   albuterol (VENTOLIN HFA) 108 (90 Base) MCG/ACT inhaler Inhale 2 puffs into the lungs every 6 (six) hours as needed  for wheezing or shortness of breath.   amLODipine (NORVASC) 5 MG tablet Take 1 tablet by mouth every day   aspirin 81 MG chewable tablet Chew 81 mg by mouth daily.   Azelastine HCl 137 MCG/SPRAY SOLN SMARTSIG:1-2 Spray(s) Both Nares Twice Daily   b complex vitamins capsule Take 1 capsule by mouth daily.   Blood Glucose Monitoring Suppl (ACCU-CHEK GUIDE) w/Device KIT See admin instructions.   carvedilol (COREG) 12.5 MG tablet TAKE 1 TABLET BY MOUTH TWICE A DAY   cyclobenzaprine (FLEXERIL) 5 MG tablet Take  1 tablet (5 mg total) by mouth 3 (three) times daily as needed for muscle spasms. Be careful, this can make you sleepy.   empagliflozin (JARDIANCE) 25 MG TABS tablet Take 1 tablet (25 mg total) by mouth daily.   escitalopram (LEXAPRO) 10 MG tablet Take 1 tablet by mouth daily   FARXIGA 10 MG TABS tablet Take 10 mg by mouth daily.   fluticasone (FLOVENT HFA) 110 MCG/ACT inhaler Inhale 1 puff into the lungs 2 (two) times daily.   Fluticasone Furoate (ARNUITY ELLIPTA) 100 MCG/ACT AEPB Inhale 1 puff into the lungs daily.   Fluticasone Furoate (ARNUITY ELLIPTA) 100 MCG/ACT AEPB Inhale 1 puff into the lungs 2 (two) times daily.   furosemide (LASIX) 20 MG tablet TAKE 1 TABLET BY MOUTH MONDAY WEDNESDAY FRIDAY   gabapentin (NEURONTIN) 100 MG capsule Take 1 capsule by mouth 3 times a day   gabapentin (NEURONTIN) 300 MG capsule Take 1 capsule (300 mg total) by mouth 3 (three) times daily.   glucose blood test strip Use 1 strip to check your blood sugar two times daily.   isosorbide dinitrate (ISORDIL) 30 MG tablet Take 1 tablet by mouth daily   isosorbide mononitrate (IMDUR) 30 MG 24 hr tablet TAKE 1 TABLET(30 MG) BY MOUTH DAILY (Patient taking differently: Take 30 mg by mouth daily.)   levothyroxine (SYNTHROID) 75 MCG tablet Take 1 tablet by mouth daily   lidocaine (LIDODERM) 5 % Place 1 patch onto the skin every 12 (twelve) hours. Remove & Discard patch within 12 hours or as directed by MD   meclizine (ANTIVERT) 12.5 MG tablet Take 1 tablet (12.5 mg total) by mouth 3 (three) times daily as needed for dizziness.   meloxicam (MOBIC) 15 MG tablet meloxicam 15 mg tablet  Take 1 tablet every day as needed for pain   nitroGLYCERIN (NITROSTAT) 0.4 MG SL tablet Place 1 tablet (0.4 mg total) under the tongue every 5 (five) minutes as needed for chest pain.   oxyCODONE (ROXICODONE) 5 MG immediate release tablet Take 1 tablet (5 mg total) by mouth every 8 (eight) hours as needed for severe pain.   pantoprazole  (PROTONIX) 40 MG tablet Take one tablet by mouth daily   polyethylene glycol powder (GLYCOLAX/MIRALAX) 17 GM/SCOOP powder    RESTASIS 0.05 % ophthalmic emulsion Place 1 drop into both eyes 2 (two) times daily.   rosuvastatin (CRESTOR) 40 MG tablet Take 1 tablet by mouth every day   sacubitril-valsartan (ENTRESTO) 49-51 MG Take 1 tablet by mouth 2 (two) times daily.   No facility-administered encounter medications on file as of 12/01/2022.    Social History: Social History   Tobacco Use   Smoking status: Never   Smokeless tobacco: Former    Types: Associate Professor status: Never Used  Substance Use Topics   Alcohol use: No   Drug use: No    Family Medical History: Family History  Problem Relation Age  of Onset   Breast cancer Maternal Aunt 66   Diabetes Mother    Leukemia Mother    Heart Problems Mother    Diabetes Sister    Mental illness Sister    Colon polyps Sister    Stroke Father    Cancer Brother        rectal   Healthy Daughter    Hypertension Son    Diabetes Sister    Thyroid disease Sister    Healthy Sister    Healthy Sister    Stroke Brother    Healthy Son    Healthy Son    Healthy Daughter     Physical Examination: There were no vitals filed for this visit.     Awake, alert, oriented to person, place, and time.  Speech is clear and fluent. Fund of knowledge is appropriate.   Cranial Nerves: Pupils equal round and reactive to light.  Facial tone is symmetric.    Mild lower posterior cervical tenderness and mild right trapezial tenderness.   Well healed cervical incision.   No tenderness TL junction.  No right sided lower lumbar tenderness.   No abnormal lesions on exposed skin.   Strength: Side Biceps Triceps Deltoid Interossei Grip Wrist Ext. Wrist Flex.  R 5 5 5 5 5 5 5   L 5 5 5 5 5 5 5    Side Iliopsoas Quads Hamstring PF DF EHL  R 5 5 5 5 5 5   L 5 5 5 5 5 5    Reflexes are 2+ and symmetric at the biceps, brachioradialis,  patella and achilles.   Hoffman's is absent bilaterally. Clonus is not present.   Bilateral upper and lower extremity sensation is intact to light touch.     No pain with IR/ER of both hips.   She has mild pain with limited ROM of right shoulder. Reasonable ROM of left shoulder with no pain.   She uses a cane to ambulate.    Medical Decision Making  Imaging: CT renal stone study on 10/24/22:  Musculoskeletal: Prior T12 vertebroplasty. Degenerative lower lumbar facet arthropathy with grade 1 anterolisthesis of L3 on L4 and L4 on L5. No new or acute bony abnormality.   CT of cervical spine dated 07/01/22: FINDINGS: Alignment: Stable anatomic alignment.   Skull base and vertebrae: No acute fracture. No primary bone lesion or focal pathologic process.   Soft tissues and spinal canal: No prevertebral fluid or swelling. No visible canal hematoma.   Disc levels: Stable C3-4 ACDF. Stable lower cervical spondylosis and facet hypertrophy greatest at the C4-5 level.   Upper chest: Airway is patent.  Lung apices are clear.   Other: Reconstructed images demonstrate no additional findings.   IMPRESSION: 1. No acute cervical spine fracture. 2. Stable C3-4 ACDF. Stable lower cervical spondylosis and facet hypertrophy.     Electronically Signed   By: Sharlet Salina M.D.   On: 07/01/2022 20:09  I have personally reviewed the images and agree with the above interpretation.    Assessment and Plan: Helen Mccormick is a pleasant 82 y.o. female who had T12 kyphoplasty by IR on 10/20/22. She is here for follow up. This helped her mid back pain.   History of lumbar surgery years ago. History of cervical fusion 4 years ago- she got better after the surgery.   Now with 4-6 weeks weeks of intermittent right LBP/buttock pain. No leg pain noted. No left sided pain. No numbness, tingling, or weakness.   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   Also with 4 weeks of constant  neck pain with left arm pain. No right arm pain. She has numbness, tingling, and weakness in her right arm.   CT from April shows ACDF C3-C4 with cervical spondylosis.   Treatment options discussed with patient and following plan made:   - MRI of cervical and lumbar spine for further evaluation. History of cervical and lumbar surgery with new pain.  - Depending on MRI results may consider PT and/or injections. She has seen Dr. Mariah Milling previously in [facility].  - She has some jaw pain/popping. Advised to follow up with PCP and/or dentist about this.  - Once I have MRI results back, will schedule phone visit with patient and her grand daughter to review.   I spent a total of 30 minutes in face-to-face and non-face-to-face activities related to this patient's care today including review of outside records, review of imaging, review of symptoms, physical exam, discussion of differential diagnosis, discussion of treatment options, and documentation.   Drake Leach PA-C Dept. of Neurosurgery

## 2022-12-01 ENCOUNTER — Ambulatory Visit (INDEPENDENT_AMBULATORY_CARE_PROVIDER_SITE_OTHER): Payer: Medicaid Other | Admitting: Orthopedic Surgery

## 2022-12-01 ENCOUNTER — Encounter: Payer: Self-pay | Admitting: Orthopedic Surgery

## 2022-12-01 VITALS — BP 140/78 | Ht 64.0 in | Wt 162.0 lb

## 2022-12-01 DIAGNOSIS — M47816 Spondylosis without myelopathy or radiculopathy, lumbar region: Secondary | ICD-10-CM | POA: Diagnosis not present

## 2022-12-01 DIAGNOSIS — M5412 Radiculopathy, cervical region: Secondary | ICD-10-CM

## 2022-12-01 DIAGNOSIS — M47812 Spondylosis without myelopathy or radiculopathy, cervical region: Secondary | ICD-10-CM

## 2022-12-01 DIAGNOSIS — Z981 Arthrodesis status: Secondary | ICD-10-CM

## 2022-12-01 DIAGNOSIS — M4722 Other spondylosis with radiculopathy, cervical region: Secondary | ICD-10-CM | POA: Diagnosis not present

## 2022-12-01 DIAGNOSIS — M4316 Spondylolisthesis, lumbar region: Secondary | ICD-10-CM

## 2022-12-01 DIAGNOSIS — Z9889 Other specified postprocedural states: Secondary | ICD-10-CM

## 2022-12-01 NOTE — Patient Instructions (Signed)
It was so nice to see you today. Thank you so much for coming in.    You have some wear and tear in your neck and back. This may be causing your symptoms.   I want to get an MRI of your neck and lower back to look into things further. We will get this approved through your insurance and [facility] will call you to schedule the appointment.   [facility] (building with the white pillars) is located off of [address]. The address is [address], [place], [place] [address].   After you have the MRI, it takes 10-14 days for me to get the results back. Once I have them, we will call you to schedule a follow up phone visit with me to review them.   Recommend you follow up with your PCP or dentist about your jaw pain.   Please do not hesitate to call if you have any questions or concerns. You can also message me in MyChart.   Drake Leach PA-C [phone]

## 2022-12-04 ENCOUNTER — Encounter: Payer: Self-pay | Admitting: Internal Medicine

## 2022-12-04 ENCOUNTER — Ambulatory Visit (INDEPENDENT_AMBULATORY_CARE_PROVIDER_SITE_OTHER): Payer: Medicare HMO | Admitting: Internal Medicine

## 2022-12-04 VITALS — BP 122/68 | HR 108 | Ht 64.0 in | Wt 164.6 lb

## 2022-12-04 DIAGNOSIS — E782 Mixed hyperlipidemia: Secondary | ICD-10-CM

## 2022-12-04 DIAGNOSIS — I10 Essential (primary) hypertension: Secondary | ICD-10-CM

## 2022-12-04 DIAGNOSIS — K921 Melena: Secondary | ICD-10-CM

## 2022-12-04 DIAGNOSIS — D509 Iron deficiency anemia, unspecified: Secondary | ICD-10-CM | POA: Diagnosis not present

## 2022-12-04 DIAGNOSIS — B356 Tinea cruris: Secondary | ICD-10-CM | POA: Diagnosis not present

## 2022-12-04 MED ORDER — CLOTRIMAZOLE-BETAMETHASONE 1-0.05 % EX CREA
1.0000 | TOPICAL_CREAM | Freq: Two times a day (BID) | CUTANEOUS | 0 refills | Status: AC
Start: 2022-12-04 — End: ?

## 2022-12-04 NOTE — Progress Notes (Signed)
Patient Notified via MyChart Messgae

## 2022-12-05 ENCOUNTER — Encounter: Payer: Self-pay | Admitting: Internal Medicine

## 2022-12-05 LAB — CBC WITH DIFFERENTIAL/PLATELET
Basophils Absolute: 0 10*3/uL (ref 0.0–0.2)
Basos: 1 %
EOS (ABSOLUTE): 0.1 10*3/uL (ref 0.0–0.4)
Eos: 3 %
Hematocrit: 36.4 % (ref 34.0–46.6)
Hemoglobin: 11.6 g/dL (ref 11.1–15.9)
Immature Grans (Abs): 0 10*3/uL (ref 0.0–0.1)
Immature Granulocytes: 0 %
Lymphocytes Absolute: 2.3 10*3/uL (ref 0.7–3.1)
Lymphs: 54 %
MCH: 24.8 pg — ABNORMAL LOW (ref 26.6–33.0)
MCHC: 31.9 g/dL (ref 31.5–35.7)
MCV: 78 fL — ABNORMAL LOW (ref 79–97)
Monocytes Absolute: 0.3 10*3/uL (ref 0.1–0.9)
Monocytes: 7 %
Neutrophils Absolute: 1.5 10*3/uL (ref 1.4–7.0)
Neutrophils: 35 %
Platelets: 181 10*3/uL (ref 150–450)
RBC: 4.67 x10E6/uL (ref 3.77–5.28)
RDW: 16.2 % — ABNORMAL HIGH (ref 11.7–15.4)
WBC: 4.3 10*3/uL (ref 3.4–10.8)

## 2022-12-05 NOTE — Progress Notes (Signed)
Established Patient Office Visit  Subjective:  Patient ID: Helen Mccormick, female    DOB: January 03, 1941  Age: 82 y.o. MRN: [id number]  Chief Complaint  Patient presents with   Follow-up    1 week follow up    Patient comes in for follow up. Feels well, no more dark colored stools. Waiting for GI appointment for anemia and Heme Positive Stools. C/o pruritic tiny patches on chest wall- will rx Lotrisone cream.    No other concerns at this time.   Past Medical History:  Diagnosis Date   Allergy    Anxiety    CHF (congestive heart failure) (HCC)    Colon polyp    Depression    GERD (gastroesophageal reflux disease)    Heart murmur    Hyperlipidemia    Hypertension    Hypothyroidism    Sleep apnea    Thyroid disease     Past Surgical History:  Procedure Laterality Date   ABDOMINAL HYSTERECTOMY     BACK SURGERY     BACK SURGERY     CERVICAL FUSION     COLONOSCOPY WITH PROPOFOL N/A 05/28/2017   Procedure: COLONOSCOPY WITH PROPOFOL;  Surgeon: Wyline Mood, MD;  Location: [facility] ENDOSCOPY;  Service: Gastroenterology;  Laterality: N/A;   COLONOSCOPY WITH PROPOFOL N/A 11/17/2019   Procedure: COLONOSCOPY WITH PROPOFOL;  Surgeon: Regis Bill, MD;  Location: [facility] ENDOSCOPY;  Service: Endoscopy;  Laterality: N/A;   ESOPHAGOGASTRODUODENOSCOPY (EGD) WITH PROPOFOL N/A 11/17/2019   Procedure: ESOPHAGOGASTRODUODENOSCOPY (EGD) WITH PROPOFOL;  Surgeon: Regis Bill, MD;  Location: [facility] ENDOSCOPY;  Service: Endoscopy;  Laterality: N/A;   IR KYPHO THORACIC WITH BONE BIOPSY  10/20/2022   IR RADIOLOGIST EVAL & MGMT  09/22/2022   NECK SURGERY     THYROID SURGERY     THYROID SURGERY      Social History   Socioeconomic History   Marital status: Single    Spouse name: Not on file   Number of children: Not on file   Years of education: Not on file   Highest education level: 10th grade  Occupational History   Occupation: retired  Tobacco Use   Smoking status: Never    Smokeless tobacco: Former    Types: Associate Professor status: Never Used  Substance and Sexual Activity   Alcohol use: No   Drug use: No   Sexual activity: Not Currently  Other Topics Concern   Not on file  Social History Narrative   Not on file   Social Determinants of Health   Financial Resource Strain: Low Risk  (04/27/2017)   Overall Financial Resource Strain (CARDIA)    Difficulty of Paying Living Expenses: Not hard at all  Food Insecurity: No Food Insecurity (04/27/2017)   Hunger Vital Sign    Worried About Running Out of Food in the Last Year: Never true    Ran Out of Food in the Last Year: Never true  Transportation Needs: No Transportation Needs (04/27/2017)   PRAPARE - Administrator, Civil Service (Medical): No    Lack of Transportation (Non-Medical): No  Physical Activity: Sufficiently Active (12/20/2018)   Exercise Vital Sign    Days of Exercise per Week: 4 days    Minutes of Exercise per Session: 60 min  Stress: No Stress Concern Present (03/12/2017)   [employer] of Occupational Health - Occupational Stress Questionnaire    Feeling of Stress : Only a little  Social Connections: Moderately Integrated (04/27/2017)  Social Advertising account executive [NHANES]    Frequency of Communication with Friends and Family: More than three times a week    Frequency of Social Gatherings with Friends and Family: Once a week    Attends Religious Services: More than 4 times per year    Active Member of [employer] or Organizations: Yes    Attends Banker Meetings: More than 4 times per year    Marital Status: Never married  Intimate Partner Violence: Unknown (03/12/2017)   Humiliation, Afraid, Rape, and Kick questionnaire    Fear of Current or Ex-Partner: Patient declined    Emotionally Abused: Patient declined    Physically Abused: Patient declined    Sexually Abused: Patient declined    Family History  Problem Relation Age of Onset    Breast cancer Maternal Aunt 65   Diabetes Mother    Leukemia Mother    Heart Problems Mother    Diabetes Sister    Mental illness Sister    Colon polyps Sister    Stroke Father    Cancer Brother        rectal   Healthy Daughter    Hypertension Son    Diabetes Sister    Thyroid disease Sister    Healthy Sister    Healthy Sister    Stroke Brother    Healthy Son    Healthy Son    Healthy Daughter     Allergies  Allergen Reactions   Amlodipine Hives and Swelling   Tramadol Other (See Comments)    Dizziness    Isosorbide Dinitrate     Other Reaction(s): Headache   Codeine Nausea Only   Etodolac Other (See Comments)    Other reaction(s): Abdominal Pain    Review of Systems  Constitutional: Negative.  Negative for chills, diaphoresis, malaise/fatigue and weight loss.  HENT: Negative.    Eyes: Negative.   Respiratory: Negative.  Negative for cough and shortness of breath.   Cardiovascular: Negative.  Negative for chest pain, palpitations and leg swelling.  Gastrointestinal: Negative.  Negative for abdominal pain, blood in stool, constipation, diarrhea, heartburn, melena, nausea and vomiting.  Genitourinary: Negative.  Negative for dysuria and flank pain.  Musculoskeletal: Negative.  Negative for joint pain and myalgias.  Skin:  Positive for itching and rash.  Neurological: Negative.  Negative for dizziness and headaches.  Endo/Heme/Allergies: Negative.   Psychiatric/Behavioral: Negative.  Negative for depression and suicidal ideas. The patient is not nervous/anxious.        Objective:   BP 122/68   Pulse (!) 108   Ht 5\' 4"  (1.626 m)   Wt 164 lb 9.6 oz (74.7 kg)   LMP  (LMP Unknown)   SpO2 96%   BMI 28.25 kg/m   Vitals:   12/04/22 1427  BP: 122/68  Pulse: (!) 108  Height: 5\' 4"  (1.626 m)  Weight: 164 lb 9.6 oz (74.7 kg)  SpO2: 96%  BMI (Calculated): 28.24    Physical Exam Vitals and nursing note reviewed.  Constitutional:      Appearance: Normal  appearance.  HENT:     Head: Normocephalic and atraumatic.     Nose: Nose normal.     Mouth/Throat:     Mouth: Mucous membranes are moist.     Pharynx: Oropharynx is clear.  Eyes:     Conjunctiva/sclera: Conjunctivae normal.     Pupils: Pupils are equal, round, and reactive to light.  Cardiovascular:     Rate and Rhythm: Normal rate and regular  rhythm.     Pulses: Normal pulses.     Heart sounds: Normal heart sounds. No murmur heard. Pulmonary:     Effort: Pulmonary effort is normal.     Breath sounds: Normal breath sounds. No wheezing.  Abdominal:     General: Bowel sounds are normal.     Palpations: Abdomen is soft.     Tenderness: There is no abdominal tenderness. There is no right CVA tenderness or left CVA tenderness.  Musculoskeletal:        General: Normal range of motion.     Cervical back: Normal range of motion.     Right lower leg: No edema.     Left lower leg: No edema.  Skin:    General: Skin is warm and dry.     Findings: Rash present.  Neurological:     General: No focal deficit present.     Mental Status: She is alert and oriented to person, place, and time.  Psychiatric:        Mood and Affect: Mood normal.        Behavior: Behavior normal.      Results for orders placed or performed in visit on 12/04/22  CBC with Diff  Result Value Ref Range   WBC 4.3 3.4 - 10.8 x10E3/uL   RBC 4.67 3.77 - 5.28 x10E6/uL   Hemoglobin 11.6 11.1 - 15.9 g/dL   Hematocrit 57.8 46.9 - 46.6 %   MCV 78 (L) 79 - 97 fL   MCH 24.8 (L) 26.6 - 33.0 pg   MCHC 31.9 31.5 - 35.7 g/dL   RDW 62.9 (H) 52.8 - 41.3 %   Platelets 181 150 - 450 x10E3/uL   Neutrophils 35 Not Estab. %   Lymphs 54 Not Estab. %   Monocytes 7 Not Estab. %   Eos 3 Not Estab. %   Basos 1 Not Estab. %   Neutrophils Absolute 1.5 1.4 - 7.0 x10E3/uL   Lymphocytes Absolute 2.3 0.7 - 3.1 x10E3/uL   Monocytes Absolute 0.3 0.1 - 0.9 x10E3/uL   EOS (ABSOLUTE) 0.1 0.0 - 0.4 x10E3/uL   Basophils Absolute 0.0 0.0 -  0.2 x10E3/uL   Immature Granulocytes 0 Not Estab. %   Immature Grans (Abs) 0.0 0.0 - 0.1 x10E3/uL    Recent Results (from the past 2160 hour(s))  CBC upon arrival     Status: Abnormal   Collection Time: 10/20/22  9:15 AM  Result Value Ref Range   WBC 6.2 4.0 - 10.5 K/uL   RBC 5.19 (H) 3.87 - 5.11 MIL/uL   Hemoglobin 12.5 12.0 - 15.0 g/dL   HCT 24.4 01.0 - 27.2 %   MCV 77.3 (L) 80.0 - 100.0 fL   MCH 24.1 (L) 26.0 - 34.0 pg   MCHC 31.2 30.0 - 36.0 g/dL   RDW 53.6 (H) 64.4 - 03.4 %   Platelets 209 150 - 400 K/uL   nRBC 0.0 0.0 - 0.2 %    Comment: Performed at [facility], [address] [place], [place] [address]  Protime-INR     Status: None   Collection Time: 10/20/22  9:47 AM  Result Value Ref Range   Prothrombin Time 14.3 11.4 - 15.2 seconds   INR 1.1 0.8 - 1.2    Comment: (NOTE) INR goal varies based on device and disease states. Performed at [facility], [address] [place], [place] [address]   Protime-INR     Status: None   Collection Time:  10/20/22 10:19 AM  Result Value Ref Range   Prothrombin Time 14.4 11.4 - 15.2 seconds   INR 1.1 0.8 - 1.2    Comment: (NOTE) INR goal varies based on device and disease states. Performed at [facility], [address] [place], [place] [address]   Basic metabolic panel     Status: Abnormal   Collection Time: 10/24/22 10:51 AM  Result Value Ref Range   Sodium 140 135 - 145 mmol/L   Potassium 3.4 (L) 3.5 - 5.1 mmol/L   Chloride 105 98 - 111 mmol/L   CO2 26 22 - 32 mmol/L   Glucose, Bld 117 (H) 70 - 99 mg/dL    Comment: Glucose reference range applies only to samples taken after fasting for at least 8 hours.   BUN 18 8 - 23 mg/dL   Creatinine, Ser 6.04 0.44 - 1.00 mg/dL   Calcium 8.9 8.9 - 54.0 mg/dL   GFR, Estimated >98 >11 mL/min    Comment: (NOTE) Calculated using the CKD-EPI Creatinine Equation (2021)    Anion gap 9 5 - 15    Comment: Performed at [facility], [address]  [place], [place] [address]  CBC     Status: Abnormal   Collection Time: 10/24/22 10:51 AM  Result Value Ref Range   WBC 4.9 4.0 - 10.5 K/uL   RBC 5.05 3.87 - 5.11 MIL/uL   Hemoglobin 12.1 12.0 - 15.0 g/dL   HCT 29.5 62.1 - 30.8 %   MCV 77.0 (L) 80.0 - 100.0 fL   MCH 24.0 (L) 26.0 - 34.0 pg   MCHC 31.1 30.0 - 36.0 g/dL   RDW 65.7 (H) 84.6 - 96.2 %   Platelets 201 150 - 400 K/uL   nRBC 0.0 0.0 - 0.2 %    Comment: Performed at [facility], [address] [place], [place] [address]  Urinalysis, Routine w reflex microscopic -Urine, Clean Catch     Status: Abnormal   Collection Time: 10/24/22 11:36 AM  Result Value Ref Range   Color, Urine YELLOW (A) YELLOW   APPearance CLEAR (A) CLEAR   Specific Gravity, Urine 1.023 1.005 - 1.030   pH 6.0 5.0 - 8.0   Glucose, UA >=500 (A) NEGATIVE mg/dL   Hgb urine dipstick NEGATIVE NEGATIVE   Bilirubin Urine NEGATIVE NEGATIVE   Ketones, ur NEGATIVE NEGATIVE mg/dL   Protein, ur NEGATIVE NEGATIVE mg/dL   Nitrite NEGATIVE NEGATIVE   Leukocytes,Ua NEGATIVE NEGATIVE   RBC / HPF 0-5 0 - 5 RBC/hpf   WBC, UA 0-5 0 - 5 WBC/hpf   Bacteria, UA NONE SEEN NONE SEEN   Squamous Epithelial / HPF 0-5 0 - 5 /HPF   Mucus PRESENT     Comment: Performed at [facility], [address] [place], [place] [address]  POCT CBG (Fasting - Glucose)     Status: Abnormal   Collection Time: 11/02/22  9:45 AM  Result Value Ref Range   Glucose Fasting, POC 180 (A) 70 - 99 mg/dL  [id number]     Status: Abnormal   Collection Time: 11/02/22 10:29 AM  Result Value Ref Range   Glucose 136 (H) 70 - 99 mg/dL   BUN 23 8 - 27 mg/dL   Creatinine, Ser 6.44 0.57 - 1.00 mg/dL   eGFR 59 (L) >03 KV/QQV/9.56   BUN/Creatinine Ratio 24 12 - 28   Sodium 144 134 - 144 mmol/L   Potassium 3.9 3.5 - 5.2 mmol/L  Chloride 106 96 - 106 mmol/L   CO2 25 20 - 29 mmol/L   Calcium 8.6 (L) 8.7 - 10.3 mg/dL   Total Protein 6.6 6.0 - 8.5 g/dL   Albumin 4.2 3.8 - 4.8 g/dL   Globulin,  Total 2.4 1.5 - 4.5 g/dL   Bilirubin Total 0.3 0.0 - 1.2 mg/dL   Alkaline Phosphatase 121 44 - 121 IU/L   AST 24 0 - 40 IU/L   ALT 16 0 - 32 IU/L  Hemoglobin A1c     Status: Abnormal   Collection Time: 11/02/22 10:29 AM  Result Value Ref Range   Hgb A1c MFr Bld 6.7 (H) 4.8 - 5.6 %    Comment:          Prediabetes: 5.7 - 6.4          Diabetes: >6.4          Glycemic control for adults with diabetes: <7.0    Est. average glucose Bld gHb Est-mCnc 146 mg/dL  Lipid Panel w/o Chol/HDL Ratio     Status: Abnormal   Collection Time: 11/02/22 10:29 AM  Result Value Ref Range   Cholesterol, Total 113 100 - 199 mg/dL   Triglycerides 87 0 - 149 mg/dL   HDL 39 (L) >32 mg/dL   VLDL Cholesterol Cal 17 5 - 40 mg/dL   LDL Chol Calc (NIH) 57 0 - 99 mg/dL  CBC with Differential/Platelet     Status: Abnormal   Collection Time: 11/02/22 10:29 AM  Result Value Ref Range   WBC 4.5 3.4 - 10.8 x10E3/uL   RBC 4.48 3.77 - 5.28 x10E6/uL   Hemoglobin 10.8 (L) 11.1 - 15.9 g/dL   Hematocrit 44.0 10.2 - 46.6 %   MCV 79 79 - 97 fL   MCH 24.1 (L) 26.6 - 33.0 pg   MCHC 30.4 (L) 31.5 - 35.7 g/dL   RDW 72.5 (H) 36.6 - 44.0 %   Platelets 234 150 - 450 x10E3/uL   Neutrophils 41 Not Estab. %   Lymphs 48 Not Estab. %   Monocytes 8 Not Estab. %   Eos 3 Not Estab. %   Basos 0 Not Estab. %   Neutrophils Absolute 1.8 1.4 - 7.0 x10E3/uL   Lymphocytes Absolute 2.1 0.7 - 3.1 x10E3/uL   Monocytes Absolute 0.4 0.1 - 0.9 x10E3/uL   EOS (ABSOLUTE) 0.1 0.0 - 0.4 x10E3/uL   Basophils Absolute 0.0 0.0 - 0.2 x10E3/uL   Immature Granulocytes 0 Not Estab. %   Immature Grans (Abs) 0.0 0.0 - 0.1 x10E3/uL  POCT CBG (Fasting - Glucose)     Status: None   Collection Time: 11/06/22  3:19 PM  Result Value Ref Range   Glucose Fasting, POC 93 70 - 99 mg/dL  POC Hemoccult Bld/Stl (3-Cd Home Screen)     Status: Abnormal   Collection Time: 11/17/22 11:39 AM  Result Value Ref Range   Card #1 Date     Fecal Occult Blood, POC Positive  (A) Negative   Card #2 Date     Card #2 Fecal Occult Blod, POC Positive    Card #3 Date     Card #3 Fecal Occult Blood, POC Positive   CBC with Diff     Status: Abnormal   Collection Time: 12/04/22  3:05 PM  Result Value Ref Range   WBC 4.3 3.4 - 10.8 x10E3/uL   RBC 4.67 3.77 - 5.28 x10E6/uL   Hemoglobin 11.6 11.1 - 15.9 g/dL   Hematocrit 34.7 42.5 -  46.6 %   MCV 78 (L) 79 - 97 fL   MCH 24.8 (L) 26.6 - 33.0 pg   MCHC 31.9 31.5 - 35.7 g/dL   RDW 63.8 (H) 75.6 - 43.3 %   Platelets 181 150 - 450 x10E3/uL   Neutrophils 35 Not Estab. %   Lymphs 54 Not Estab. %   Monocytes 7 Not Estab. %   Eos 3 Not Estab. %   Basos 1 Not Estab. %   Neutrophils Absolute 1.5 1.4 - 7.0 x10E3/uL   Lymphocytes Absolute 2.3 0.7 - 3.1 x10E3/uL   Monocytes Absolute 0.3 0.1 - 0.9 x10E3/uL   EOS (ABSOLUTE) 0.1 0.0 - 0.4 x10E3/uL   Basophils Absolute 0.0 0.0 - 0.2 x10E3/uL   Immature Granulocytes 0 Not Estab. %   Immature Grans (Abs) 0.0 0.0 - 0.1 x10E3/uL      Assessment & Plan:  Gi referral again. Continue meds. Avoid spicy food. Problem List Items Addressed This Visit     Microcytic anemia - Primary   Relevant Orders   CBC with Diff (Completed)   Essential hypertension, benign   Mixed hyperlipidemia   Melena   Relevant Orders   Ambulatory referral to Gastroenterology   Other Visit Diagnoses     Tinea cruris       Relevant Medications   clotrimazole-betamethasone (LOTRISONE) cream      Follow up one month.    Total time spent: 25 minutes  [name], MD  12/04/2022   This document may have been prepared by [facility] Voice Recognition software and as such may include unintentional dictation errors.

## 2022-12-16 NOTE — Telephone Encounter (Signed)
Patient granddaughter called in wanting to schedule appt for her grandmother I informed her due to her not been on the [facility] or her not been around give me verbally permission to talk to you. She said okay she will try to call back with the patient

## 2022-12-23 ENCOUNTER — Ambulatory Visit
Admission: RE | Admit: 2022-12-23 | Discharge: 2022-12-23 | Disposition: A | Discharge status: Home / Self Care | Payer: Medicare HMO | Source: Ambulatory Visit | Attending: Orthopedic Surgery | Admitting: Orthopedic Surgery

## 2022-12-23 DIAGNOSIS — M47816 Spondylosis without myelopathy or radiculopathy, lumbar region: Secondary | ICD-10-CM | POA: Diagnosis present

## 2022-12-23 DIAGNOSIS — Z9889 Other specified postprocedural states: Secondary | ICD-10-CM | POA: Diagnosis present

## 2022-12-23 DIAGNOSIS — M47812 Spondylosis without myelopathy or radiculopathy, cervical region: Secondary | ICD-10-CM | POA: Diagnosis present

## 2022-12-23 DIAGNOSIS — Z981 Arthrodesis status: Secondary | ICD-10-CM | POA: Diagnosis present

## 2022-12-23 DIAGNOSIS — M5412 Radiculopathy, cervical region: Secondary | ICD-10-CM | POA: Insufficient documentation

## 2023-01-01 ENCOUNTER — Telehealth: Payer: Self-pay | Admitting: Internal Medicine

## 2023-01-01 ENCOUNTER — Other Ambulatory Visit: Payer: Self-pay | Admitting: Internal Medicine

## 2023-01-01 DIAGNOSIS — E890 Postprocedural hypothyroidism: Secondary | ICD-10-CM

## 2023-01-01 MED ORDER — LEVOTHYROXINE SODIUM 75 MCG PO TABS: 75.0000 ug | ORAL_TABLET | Freq: Every day | ORAL | 11 refills | Status: DC

## 2023-01-01 MED ORDER — LEVOTHYROXINE SODIUM 75 MCG PO TABS: 75.0000 ug | ORAL_TABLET | Freq: Every day | ORAL | 3 refills | Status: DC

## 2023-01-01 NOTE — Telephone Encounter (Signed)
Patient's granddaughter called in stating she was working on getting the patient's medications straightened out and realized she is missing her Synthroid. It is not on her current med list. She wants to know if she should be taking it or not. Please advise.

## 2023-01-13 ENCOUNTER — Telehealth: Payer: Self-pay | Admitting: Internal Medicine

## 2023-01-13 ENCOUNTER — Encounter: Payer: Self-pay | Admitting: Internal Medicine

## 2023-01-13 NOTE — Telephone Encounter (Signed)
Patient's granddaughter left VM wanting a call back to discuss the patient's medications.

## 2023-01-15 NOTE — Progress Notes (Addendum)
Telephone Follow up Note: Referring Physician:  No referring provider defined for this encounter.  Primary Physician:  [name], MD  History of Present Illness: Helen Mccormick is a 82 y.o. female has a history of HTN, nonischemic cardiomyopathy, OSA, asthma, gastritis, DM, hyperlipidemia, hypothyroidism, and macular degeneration both eyes.    She had T12 kyphoplasty by IR on 10/20/22. This helped her mid back pain. History of cervical fusion years ago. History of lumbar surgery years ago.   Last seen by me on 12/01/22 for neck and left arm pain along with right sided LBP.   CT from April shows ACDF C3-C4 with cervical spondylosis. She has known lumbar spondylosis with slip L3-L4 and L4-L5 (more recent imaging was CT renal study).   At her request, I called and discussed MRI results with her grand daughter Helen Mccormick.   She has known lumbar spondylosis with slip L3-L4 and L4-L5 along with facet hypertrophy and bilateral foraminal stenosis L3-S1. LBP is likely from underlying spondylosis.   She has moderate central stenosis C2-C3 with right foraminal stenosis, fusion C3-C4, and right foraminal stenosis C4-C5 with question of myelomalacia on left and mild central stenosis.   Helen Mccormick was unsure regarding [name]'s symptoms. She thought she was having more upper than lower back pain. She thinks she did not have any neck and left arm pain.   We discussed possible referral back to [facility]R Mariah Milling) for lumbar injections. I wanted to discuss symptoms with patient and called her with no answer. Will try to call her again to speak with her.   Spoke with patient on 01/19/23. She is a poor historian.   She has intermittent neck pain with left arm pain to her elbow. She has numbness in left hand. She notes she is dropping things. She has some issue with hand dexterity. Her balance is not great, but has not gotten worse.   She also has constant LBP with right lateral leg pain to her ankle. She  also has some pain from her knees into her ankles on both legs. Pain is worse with standing and walking. Her lower back is her primary complaint.   No bowel or bladder issues.   Will plan to do referral to PMR for lumbar injections. She has seen Mariah Milling in [place] previously. Will review cervical MRI with Dr. Myer Haff and call her grand daughter with further plan.   Imaging: Cervical MRI dated 12/23/22:  FINDINGS: Alignment: Straightening of cervical lordosis with slight C4-5 anterolisthesis.   Vertebrae: No fracture, evidence of discitis, or bone lesion. Solid arthrodesis at C3-4   Cord: Normal morphology. A tiny focus of myelomalacia is questioned in the left cord at the level of C4-5 where there is mild stenosis.   Posterior Fossa, vertebral arteries, paraspinal tissues: Negative.   Disc levels:   C2-3: Degenerative facet and uncovertebral spurring on the right where there is moderate stenosis.   C3-4: ACDF with solid arthrodesis and no impingement   C4-5: Facet degeneration especially pronounced on the right with mild anterolisthesis. Uncovertebral spurring on both sides. Right foraminal impingement.   C5-6: Uncovertebral and facet spurring. Disc space narrowing and ridging. No neural impingement   C6-7: Disc space narrowing and bulging. Degenerative facet spurring asymmetric to the left. No neural impingement   C7-T1:Mild facet spurring.   IMPRESSION: Uncomplicated C3-4 ACDF with solid arthrodesis and no impingement.   Degenerative foraminal impingement on the right at C2-3 and C4-5.   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     Electronically Signed   By: Tiburcio Pea M.D.   On: 01/16/2023 06:34    Lumbar MRI dated 12/23/22:  FINDINGS: Segmentation:  Standard.   Alignment: Grade 1 anterolisthesis of L4 on L5 secondary to facet disease. Minimal grade 1 anterolisthesis of L3 on L4. 2 mm retrolisthesis of L5 on S1.   Vertebrae: Chronic  T12 vertebral body compression fracture with 10% height loss, prior vertebral body augmentation and mild residual marrow edema in the posterior aspect of the vertebral body. Mild marrow edema partially visualized in the posterior aspect of the T11 vertebral body. No discitis or osteomyelitis. No aggressive osseous lesion.   Conus medullaris and cauda equina: Conus extends to the L1-2 level. Conus and cauda equina appear normal.   Paraspinal and other soft tissues: No acute paraspinal abnormality.   Disc levels:   Disc spaces: Degenerative disease with disc height loss at L4-5 and L5-S1.   T11-12: Broad-based disc bulge. Moderate bilateral facet arthropathy. Moderate right and mild left foraminal stenosis. No spinal stenosis.   T12-L1: Mild broad-based disc bulge. Mild bilateral facet arthropathy. No foraminal or central canal stenosis.   L1-L2: No disc protrusion, foraminal stenosis or central canal stenosis. To   L2-L3: Mild broad-based disc bulge. Mild bilateral facet arthropathy. No foraminal or central canal stenosis.   L3-L4: Broad-based disc bulge. Moderate bilateral facet arthropathy with ligamentum flavum infolding. Moderate spinal stenosis. Bilateral lateral recess stenosis. Mild right and moderate left foraminal stenosis.   L4-L5: Broad-based disc bulge. Severe bilateral facet arthropathy with ligamentum flavum infolding. Moderate spinal stenosis. Bilateral lateral recess stenosis. Moderate left and mild-moderate right foraminal stenosis.   L5-S1: Mild broad-based disc bulge. Mild bilateral facet arthropathy. Mild left and moderate right foraminal stenosis. No spinal stenosis.   IMPRESSION: 1. At L3-4 there is a broad-based disc bulge. Moderate bilateral facet arthropathy with ligamentum flavum infolding. Moderate spinal stenosis. Bilateral lateral recess stenosis. Mild right and moderate left foraminal stenosis. 2. At L4-5 there is a broad-based disc bulge.  Severe bilateral facet arthropathy with ligamentum flavum infolding. Moderate spinal stenosis. Bilateral lateral recess stenosis. Moderate left and mild-moderate right foraminal stenosis. 3. At L5-S1 there is a mild broad-based disc bulge. Mild bilateral facet arthropathy. Mild left and moderate right foraminal stenosis. 4. Chronic T12 vertebral body compression fracture with 10% height loss, prior augmentation, and mild residual marrow edema in the posterior aspect of the vertebral body. Mild marrow edema partially visualized in the posterior aspect of the T11 vertebral body.     Electronically Signed   By: Elige Ko M.D.   On: 01/14/2023 10:56  I have personally reviewed the images and agree with the above interpretation.  Drake Leach PA-C Neurosurgery

## 2023-01-18 ENCOUNTER — Ambulatory Visit (INDEPENDENT_AMBULATORY_CARE_PROVIDER_SITE_OTHER): Payer: 59 | Admitting: Orthopedic Surgery

## 2023-01-18 DIAGNOSIS — M47812 Spondylosis without myelopathy or radiculopathy, cervical region: Secondary | ICD-10-CM

## 2023-01-18 DIAGNOSIS — M47816 Spondylosis without myelopathy or radiculopathy, lumbar region: Secondary | ICD-10-CM

## 2023-01-20 ENCOUNTER — Telehealth: Payer: Self-pay | Admitting: Orthopedic Surgery

## 2023-01-20 DIAGNOSIS — M5412 Radiculopathy, cervical region: Secondary | ICD-10-CM

## 2023-01-20 DIAGNOSIS — M4316 Spondylolisthesis, lumbar region: Secondary | ICD-10-CM

## 2023-01-20 DIAGNOSIS — Z9889 Other specified postprocedural states: Secondary | ICD-10-CM

## 2023-01-20 DIAGNOSIS — Z981 Arthrodesis status: Secondary | ICD-10-CM

## 2023-01-20 DIAGNOSIS — M47816 Spondylosis without myelopathy or radiculopathy, lumbar region: Secondary | ICD-10-CM

## 2023-01-20 DIAGNOSIS — M5416 Radiculopathy, lumbar region: Secondary | ICD-10-CM

## 2023-01-20 DIAGNOSIS — M47812 Spondylosis without myelopathy or radiculopathy, cervical region: Secondary | ICD-10-CM

## 2023-01-20 NOTE — Telephone Encounter (Signed)
Cervical MRI reviewed with Dr. Myer Haff regarding question of myelomalacia at C4-C5. Would follow for now unless she has worsening of myelopathic symptoms.   May consider EMG at some point if she continues with numbness in left hand.   Please call her grand daughter [name] and let her know we don't need to anything for her neck. I put in referral for her to see Dr. Mariah Milling in [facility] to discuss injections.   Please schedule her a follow up with me in 6-8 weeks.

## 2023-01-21 NOTE — Telephone Encounter (Signed)
Left message on Helen Mccormick # for her to return call.

## 2023-01-21 NOTE — Telephone Encounter (Signed)
I spoke with Helen Mccormick and relayed Helen Mccormick's recommendations. I scheduled an appointment for 03/10/23.

## 2023-01-21 NOTE — Telephone Encounter (Signed)
[phone] [name] the granddaughter returning your call.

## 2023-01-28 ENCOUNTER — Encounter: Payer: Self-pay | Admitting: Gastroenterology

## 2023-01-28 ENCOUNTER — Ambulatory Visit (INDEPENDENT_AMBULATORY_CARE_PROVIDER_SITE_OTHER): Payer: 59 | Admitting: Gastroenterology

## 2023-01-28 ENCOUNTER — Other Ambulatory Visit: Payer: Self-pay

## 2023-01-28 VITALS — BP 147/78 | HR 92 | Temp 98.7°F | Ht 64.0 in | Wt 156.0 lb

## 2023-01-28 DIAGNOSIS — R195 Other fecal abnormalities: Secondary | ICD-10-CM

## 2023-01-28 DIAGNOSIS — D509 Iron deficiency anemia, unspecified: Secondary | ICD-10-CM

## 2023-01-28 MED ORDER — CLENPIQ 10-3.5-12 MG-GM -GM/160ML PO SOLN: ORAL | 0 refills | Status: DC

## 2023-01-28 NOTE — Addendum Note (Signed)
Addended by: Adela Ports on: 01/28/2023 03:16 PM   Modules accepted: Orders

## 2023-01-28 NOTE — Progress Notes (Signed)
Wyline Mood MD, MRCP(U.K) [address]  [address]  [place], [place] [address]  Main: [phone]  Fax: [phone]   Gastroenterology Consultation  Referring Provider:     [name], MD Primary Care Physician:  [name], MD Primary Gastroenterologist:  Dr. Wyline Mood  Reason for Consultation:     Melena         HPI:   Helen Mccormick is a 82 y.o. y/o female referred for consultation & management  by Dr. [name], MD.     MCV 78.  I cannot see any recent iron studies that were checked.  Stool occult was checked for unclear reason she is 82 years old positive x 3.  Urine analysis in July 2024 was negative for any blood.  Last colonoscopy by Dr. Mia Creek in 11/17/2019 showed diverticulosis of the colon no polyps.  She denies any change in her bowel habits no black stools.  No NSAID use.  No other complaints.    Latest Ref Rng & Units 12/04/2022    3:05 PM 11/02/2022   10:29 AM 10/24/2022   10:51 AM  CBC  WBC 3.4 - 10.8 x10E3/uL 4.3  4.5  4.9   Hemoglobin 11.1 - 15.9 g/dL 53.6  64.4  03.4   Hematocrit 34.0 - 46.6 % 36.4  35.5  38.9   Platelets 150 - 450 x10E3/uL 181  234  201    Stool occult positive unclear reason for checking  Past Medical History:  Diagnosis Date   Allergy    Anxiety    CHF (congestive heart failure) (HCC)    Colon polyp    Depression    GERD (gastroesophageal reflux disease)    Heart murmur    Hyperlipidemia    Hypertension    Hypothyroidism    Sleep apnea    Thyroid disease     Past Surgical History:  Procedure Laterality Date   ABDOMINAL HYSTERECTOMY     BACK SURGERY     BACK SURGERY     CERVICAL FUSION     COLONOSCOPY WITH PROPOFOL N/A 05/28/2017   Procedure: COLONOSCOPY WITH PROPOFOL;  Surgeon: Wyline Mood, MD;  Location: [facility] ENDOSCOPY;  Service: Gastroenterology;  Laterality: N/A;   COLONOSCOPY WITH PROPOFOL N/A 11/17/2019   Procedure: COLONOSCOPY WITH PROPOFOL;  Surgeon: Regis Bill, MD;  Location:  [facility] ENDOSCOPY;  Service: Endoscopy;  Laterality: N/A;   ESOPHAGOGASTRODUODENOSCOPY (EGD) WITH PROPOFOL N/A 11/17/2019   Procedure: ESOPHAGOGASTRODUODENOSCOPY (EGD) WITH PROPOFOL;  Surgeon: Regis Bill, MD;  Location: [facility] ENDOSCOPY;  Service: Endoscopy;  Laterality: N/A;   IR KYPHO THORACIC WITH BONE BIOPSY  10/20/2022   IR RADIOLOGIST EVAL & MGMT  09/22/2022   NECK SURGERY     THYROID SURGERY     THYROID SURGERY      Prior to Admission medications   Medication Sig Start Date End Date Taking? Authorizing Provider  acetaminophen (TYLENOL) 650 MG CR tablet Take 650 mg by mouth every 8 (eight) hours as needed for pain.    [provider]  albuterol (VENTOLIN HFA) 108 (90 Base) MCG/ACT inhaler Inhale 2 puffs into the lungs every 6 (six) hours as needed for wheezing or shortness of breath. 08/20/22   [name], MD  amLODipine (NORVASC) 5 MG tablet Take 1 tablet by mouth every day 10/13/22   [name], MD  aspirin 81 MG chewable tablet Chew 81 mg by mouth daily.    [provider]  Azelastine HCl  137 MCG/SPRAY SOLN SMARTSIG:1-2 Spray(s) Both Nares Twice Daily 07/24/19   [provider]  Blood Glucose Monitoring Suppl (ACCU-CHEK GUIDE) w/Device KIT See admin instructions. 11/28/20   [provider]  Cholecalciferol (VITAMIN D-3) 25 MCG (1000 UT) CAPS Take by mouth.    [provider]  clotrimazole-betamethasone (LOTRISONE) cream Apply 1 Application topically 2 (two) times daily. 12/04/22   [name], MD  empagliflozin (JARDIANCE) 25 MG TABS tablet Take 1 tablet (25 mg total) by mouth daily. 08/04/22   [name], MD  escitalopram (LEXAPRO) 10 MG tablet Take 1 tablet by mouth daily 07/14/22   [name], MD  FARXIGA 10 MG TABS tablet Take 10 mg by mouth daily.    [provider]  fluticasone (FLOVENT HFA) 110 MCG/ACT inhaler Inhale 1 puff into the lungs 2 (two) times daily.    [provider]  glucose blood test strip  Use 1 strip to check your blood sugar two times daily. 06/21/18   Galen Manila, NP  ibuprofen (ADVIL) 200 MG tablet Take 200 mg by mouth every 6 (six) hours as needed.    [provider]  isosorbide dinitrate (ISORDIL) 30 MG tablet Take 1 tablet by mouth daily 07/14/22   [name], MD  levothyroxine (SYNTHROID) 75 MCG tablet Take 1 tablet (75 mcg total) by mouth daily. 01/01/23 01/01/24  [name], MD  levothyroxine (SYNTHROID) 75 MCG tablet Take 1 tablet (75 mcg total) by mouth daily. 01/01/23 01/01/24  [name], MD  meclizine (ANTIVERT) 12.5 MG tablet Take 1 tablet (12.5 mg total) by mouth 3 (three) times daily as needed for dizziness. 10/13/22   [name], MD  nitroGLYCERIN (NITROSTAT) 0.4 MG SL tablet Place 1 tablet (0.4 mg total) under the tongue every 5 (five) minutes as needed for chest pain. 08/20/17   Galen Manila, NP  Omega-3 Fatty Acids (FISH OIL) 1000 MG CAPS Take by mouth.    [provider]  pantoprazole (PROTONIX) 40 MG tablet Take one tablet by mouth daily 11/19/22   [name], MD  polyethylene glycol powder (GLYCOLAX/MIRALAX) 17 GM/SCOOP powder     [provider]  RESTASIS 0.05 % ophthalmic emulsion Place 1 drop into both eyes 2 (two) times daily. 06/26/22   [provider]  sacubitril-valsartan (ENTRESTO) 49-51 MG Take 1 tablet by mouth 2 (two) times daily. 10/09/22   Laurier Nancy, MD    Family History  Problem Relation Age of Onset   Breast cancer Maternal Aunt 78   Diabetes Mother    Leukemia Mother    Heart Problems Mother    Diabetes Sister    Mental illness Sister    Colon polyps Sister    Stroke Father    Cancer Brother        rectal   Healthy Daughter    Hypertension Son    Diabetes Sister    Thyroid disease Sister    Healthy Sister    Healthy Sister    Stroke Brother    Healthy Son    Healthy Son    Healthy Daughter      Social History   Tobacco Use   Smoking status: Never    Smokeless tobacco: Former    Types: Engineer, drilling   Vaping status: Never Used  Substance Use Topics   Alcohol use: No   Drug use: No    Allergies as of 01/28/2023 - Review Complete 01/28/2023  Allergen Reaction Noted   Amlodipine Hives and Swelling 12/24/2014   Tramadol Other (See Comments) 06/14/2019   Isosorbide dinitrate  07/06/2018   Codeine Nausea Only 09/11/2020   Etodolac Other (See Comments) 04/22/2016    Review of Systems:    All systems reviewed and negative except where noted in HPI.   Physical Exam:  BP (!) 147/78   Pulse 92   Temp 98.7 F (37.1 C) (Oral)   Ht 5\' 4"  (1.626 m)   Wt 156 lb (70.8 kg)   LMP  (LMP Unknown)   BMI 26.78 kg/m  No LMP recorded (lmp unknown). Patient has had a hysterectomy. Psych:  Alert and cooperative. Normal mood and affect. General:   Alert,  Well-developed, well-nourished, pleasant and cooperative in NAD Head:  Normocephalic and atraumatic. Eyes:  Sclera clear, no icterus.   Conjunctiva pink. Ears:  Normal auditory acuity. Neck:  Supple; no masses or thyromegaly. Lungs:  Respirations even and unlabored.  Clear throughout to auscultation.   No wheezes, crackles, or rhonchi. No acute distress. Heart:  Regular rate and rhythm; no murmurs, clicks, rubs, or gallops. Abdomen:  Normal bowel sounds.  No bruits.  Soft, non-tender and non-distended without masses, hepatosplenomegaly or hernias noted.  No guarding or rebound tenderness.    Neurologic:  Alert and oriented x3;  grossly normal neurologically. Psych:  Alert and cooperative. Normal mood and affect.  Imaging Studies: No results found.  Assessment and Plan:   Helen Mccormick is a 82 y.o. y/o female has been referred for melena.  Recent labs suggest microcytic anemia.  No recent iron studies checked.  Stool occult checked unclear reason positive x 3.  Plan 1.  Iron studies B12 folate to evaluate for microcytic anemia if positive for iron deficiency will need EGD and  colonoscopy otherwise since stool occult was tested and is positive will require only a colonoscopy.  Explained to her that since her stool test is positive we have no other option but to do her colonoscopy and she should not have any further stool tests in the future as it is only meant for colon cancer screening and she is over 80 .  I have discussed alternative options, risks & benefits,  which include, but are not limited to, bleeding, infection, perforation,respiratory complication & drug reaction.  The patient agrees with this plan & written consent will be obtained.     Follow up in 8 weeks with Celso Amy  Dr Wyline Mood MD,MRCP(U.K)

## 2023-01-29 LAB — CBC WITH DIFFERENTIAL/PLATELET
Basophils Absolute: 0 10*3/uL (ref 0.0–0.2)
Basos: 1 %
EOS (ABSOLUTE): 0.2 10*3/uL (ref 0.0–0.4)
Eos: 3 %
Hematocrit: 41.8 % (ref 34.0–46.6)
Hemoglobin: 12.5 g/dL (ref 11.1–15.9)
Immature Grans (Abs): 0 10*3/uL (ref 0.0–0.1)
Immature Granulocytes: 0 %
Lymphocytes Absolute: 3.1 10*3/uL (ref 0.7–3.1)
Lymphs: 58 %
MCH: 23.9 pg — ABNORMAL LOW (ref 26.6–33.0)
MCHC: 29.9 g/dL — ABNORMAL LOW (ref 31.5–35.7)
MCV: 80 fL (ref 79–97)
Monocytes Absolute: 0.4 10*3/uL (ref 0.1–0.9)
Monocytes: 7 %
Neutrophils Absolute: 1.7 10*3/uL (ref 1.4–7.0)
Neutrophils: 31 %
Platelets: 225 10*3/uL (ref 150–450)
RBC: 5.24 x10E6/uL (ref 3.77–5.28)
RDW: 16.1 % — ABNORMAL HIGH (ref 11.7–15.4)
WBC: 5.3 10*3/uL (ref 3.4–10.8)

## 2023-01-29 LAB — IRON,TIBC AND FERRITIN PANEL
Ferritin: 184 ng/mL — ABNORMAL HIGH (ref 15–150)
Iron Saturation: 50 % (ref 15–55)
Iron: 146 ug/dL — ABNORMAL HIGH (ref 27–139)
Total Iron Binding Capacity: 294 ug/dL (ref 250–450)
UIBC: 148 ug/dL (ref 118–369)

## 2023-01-29 LAB — COMPREHENSIVE METABOLIC PANEL
ALT: 35 [IU]/L — ABNORMAL HIGH (ref 0–32)
AST: 39 [IU]/L (ref 0–40)
Albumin: 4.6 g/dL (ref 3.8–4.8)
Alkaline Phosphatase: 190 [IU]/L — ABNORMAL HIGH (ref 44–121)
BUN/Creatinine Ratio: 19 (ref 12–28)
BUN: 19 mg/dL (ref 8–27)
Bilirubin Total: 0.4 mg/dL (ref 0.0–1.2)
CO2: 24 mmol/L (ref 20–29)
Calcium: 9.8 mg/dL (ref 8.7–10.3)
Chloride: 108 mmol/L — ABNORMAL HIGH (ref 96–106)
Creatinine, Ser: 0.98 mg/dL (ref 0.57–1.00)
Globulin, Total: 2.7 g/dL (ref 1.5–4.5)
Glucose: 95 mg/dL (ref 70–99)
Potassium: 4 mmol/L (ref 3.5–5.2)
Sodium: 147 mmol/L — ABNORMAL HIGH (ref 134–144)
Total Protein: 7.3 g/dL (ref 6.0–8.5)
eGFR: 58 mL/min/{1.73_m2} — ABNORMAL LOW (ref >59)

## 2023-01-29 LAB — B12 AND FOLATE PANEL
Folate: >20 ng/mL (ref >3.0)
Vitamin B-12: 715 pg/mL (ref 232–1245)

## 2023-02-01 ENCOUNTER — Encounter: Payer: Self-pay | Admitting: Internal Medicine

## 2023-02-02 ENCOUNTER — Emergency Department: Payer: 59

## 2023-02-02 ENCOUNTER — Encounter: Payer: Self-pay | Admitting: Emergency Medicine

## 2023-02-02 ENCOUNTER — Emergency Department
Admission: EM | Admit: 2023-02-02 | Discharge: 2023-02-02 | Disposition: A | Discharge status: Home / Self Care | Payer: 59 | Attending: Emergency Medicine | Admitting: Emergency Medicine

## 2023-02-02 ENCOUNTER — Other Ambulatory Visit: Payer: Self-pay

## 2023-02-02 DIAGNOSIS — E119 Type 2 diabetes mellitus without complications: Secondary | ICD-10-CM | POA: Insufficient documentation

## 2023-02-02 DIAGNOSIS — I509 Heart failure, unspecified: Secondary | ICD-10-CM | POA: Diagnosis not present

## 2023-02-02 DIAGNOSIS — I110 Hypertensive heart disease with heart failure: Secondary | ICD-10-CM | POA: Insufficient documentation

## 2023-02-02 DIAGNOSIS — M542 Cervicalgia: Secondary | ICD-10-CM | POA: Insufficient documentation

## 2023-02-02 DIAGNOSIS — E039 Hypothyroidism, unspecified: Secondary | ICD-10-CM | POA: Diagnosis not present

## 2023-02-02 DIAGNOSIS — Z4789 Encounter for other orthopedic aftercare: Secondary | ICD-10-CM | POA: Diagnosis not present

## 2023-02-02 DIAGNOSIS — M47812 Spondylosis without myelopathy or radiculopathy, cervical region: Secondary | ICD-10-CM | POA: Diagnosis not present

## 2023-02-02 DIAGNOSIS — Z981 Arthrodesis status: Secondary | ICD-10-CM | POA: Diagnosis not present

## 2023-02-02 MED ORDER — ACETAMINOPHEN 325 MG PO TABS
650.0000 mg | ORAL_TABLET | Freq: Once | ORAL | Status: AC
  Administered 2023-02-02: 650 mg via ORAL
  Filled 2023-02-02: qty 2

## 2023-02-02 MED ORDER — OXYCODONE HCL 5 MG PO TABS: 5.0000 mg | ORAL_TABLET | Freq: Three times a day (TID) | ORAL | 0 refills | Status: DC | PRN

## 2023-02-02 NOTE — ED Notes (Signed)
Pt states that they have pain in their jaw and head that started yesterday. Pain is a 9/10.

## 2023-02-02 NOTE — ED Provider Notes (Signed)
[facility] Provider Note    Event Date/Time   First MD Initiated Contact with Patient 02/02/23 0805     (approximate)   History   Neck Pain   HPI  Helen Mccormick is a 82 y.o. female   presents to the ED with complaint of neck pain without history of recent injury.  Patient denies any recent injury but states she did have neck surgery approximately 15 years ago.  Records show that she did have a cervical fusion.  Patient has not take any over-the-counter medication as she is scheduled for a endoscopy/colonoscopy the end of the month.  Patient has history of congestive heart failure, hypertension, hypothyroidism, diabetes type 2, cervical degenerative disc disease and cervical radiculopathy, GERD, depression, anxiety, and history of compression fracture T12.      Physical Exam   Triage Vital Signs: ED Triage Vitals  Encounter Vitals Group     BP 02/02/23 0743 138/79     Systolic BP Percentile --      Diastolic BP Percentile --      Pulse Rate 02/02/23 0743 67     Resp 02/02/23 0743 18     Temp 02/02/23 0743 97.6 F (36.4 C)     Temp Source 02/02/23 0743 Oral     SpO2 02/02/23 0743 95 %     Weight 02/02/23 0744 160 lb (72.6 kg)     Height 02/02/23 0744 5\' 4"  (1.626 m)     Head Circumference --      Peak Flow --      Pain Score 02/02/23 0744 10     Pain Loc --      Pain Education --      Exclude from Growth Chart --     Most recent vital signs: Vitals:   02/02/23 0743 02/02/23 1045  BP: 138/79 (!) 156/82  Pulse: 67 72  Resp: 18 16  Temp: 97.6 F (36.4 C) 97.8 F (36.6 C)  SpO2: 95% 96%     General: Awake, no distress.  Pleasant, talkative. CV:  Good peripheral perfusion.  Heart regular rate and rhythm. Resp:  Normal effort.  Lungs clear bilaterally. Abd:  No distention.  Other:  Examination of cervical spine there is no gross deformity and no noted tenderness on palpation posteriorly.  Moderate tenderness on palpation of the  cervical muscles and trapezius bilaterally.  Range of motion is minimally restricted secondary to discomfort.  No skin abrasions or discoloration is present.  Patient is able to move upper and lower extremities without any difficulty.  She is able to stand and ambulate without any assistance.  Good muscle strength at 5/5 bilaterally.   ED Results / Procedures / Treatments   Labs (all labs ordered are listed, but only abnormal results are displayed) Labs Reviewed - No data to display    RADIOLOGY Cervical spine x-ray images were reviewed by myself independent of the radiologist and fusion is noted with surgical hardware in place.  Degenerative changes noted C4-C5.    PROCEDURES:  Critical Care performed:   Procedures   MEDICATIONS ORDERED IN ED: Medications  acetaminophen (TYLENOL) tablet 650 mg (650 mg Oral Given 02/02/23 0903)     IMPRESSION / MDM / ASSESSMENT AND PLAN / ED COURSE  I reviewed the triage vital signs and the nursing notes.   Differential diagnosis includes, but is not limited to, cervical pain, cervical strain, degenerative disc disease, musculoskeletal pain.  82 year old female presents to the ED with complaint  of cervical pain without history of recent injury.  Patient did have surgery approximately 15 years ago and is worried something may have happened.  No over-the-counter medication has been taken due to her being scheduled for a endoscopy/colonoscopy at the end of the month.  She was told not to take NSAIDs.  Patient is currently driving.  A prescription for oxycodone every 8 hours as needed for moderate to severe pain was sent to the pharmacy.  She is to continue using ice or heat to her neck as needed for discomfort.  She is aware that she cannot drive or operate machinery while taking this medication.      Patient's presentation is most consistent with acute complicated illness / injury requiring diagnostic workup.  FINAL CLINICAL IMPRESSION(S) / ED  DIAGNOSES   Final diagnoses:  Cervical muscle pain     Rx / DC Orders   ED Discharge Orders          Ordered    oxyCODONE (OXY IR/ROXICODONE) 5 MG immediate release tablet  Every 8 hours PRN        02/02/23 1026             Note:  This document was prepared using Dragon voice recognition software and may include unintentional dictation errors.   Tommi Rumps, PA-C 02/02/23 1109    Jene Every, MD 02/02/23 1146

## 2023-02-02 NOTE — ED Triage Notes (Signed)
Patient to ED via POV for neck pain that radiates into head and bilateral arms. Denies any injury.

## 2023-02-02 NOTE — Discharge Instructions (Signed)
Call make appointment with your primary care provider if any continued problems or concerns.  You may also use ice or heat to your neck as needed for discomfort.  A limited number of oxycodone was sent to the pharmacy for you to take.  Do not take anti-inflammatories such as ibuprofen, Aleve, Motrin before having your procedure done as you have already been told by your provider.

## 2023-02-04 ENCOUNTER — Encounter: Payer: Self-pay | Admitting: Internal Medicine

## 2023-02-04 ENCOUNTER — Ambulatory Visit (INDEPENDENT_AMBULATORY_CARE_PROVIDER_SITE_OTHER): Payer: 59 | Admitting: Internal Medicine

## 2023-02-04 VITALS — BP 102/68 | HR 78 | Ht 64.0 in | Wt 157.0 lb

## 2023-02-04 DIAGNOSIS — R748 Abnormal levels of other serum enzymes: Secondary | ICD-10-CM | POA: Diagnosis not present

## 2023-02-04 DIAGNOSIS — E782 Mixed hyperlipidemia: Secondary | ICD-10-CM | POA: Diagnosis not present

## 2023-02-04 DIAGNOSIS — D509 Iron deficiency anemia, unspecified: Secondary | ICD-10-CM | POA: Diagnosis not present

## 2023-02-04 DIAGNOSIS — Z23 Encounter for immunization: Secondary | ICD-10-CM

## 2023-02-04 DIAGNOSIS — I25118 Atherosclerotic heart disease of native coronary artery with other forms of angina pectoris: Secondary | ICD-10-CM

## 2023-02-04 DIAGNOSIS — E119 Type 2 diabetes mellitus without complications: Secondary | ICD-10-CM

## 2023-02-04 DIAGNOSIS — I10 Essential (primary) hypertension: Secondary | ICD-10-CM

## 2023-02-04 DIAGNOSIS — Z832 Family history of diseases of the blood and blood-forming organs and certain disorders involving the immune mechanism: Secondary | ICD-10-CM

## 2023-02-04 LAB — POCT CBG (FASTING - GLUCOSE)-MANUAL ENTRY: Glucose Fasting, POC: 129 mg/dL — AB (ref 70–99)

## 2023-02-04 NOTE — Progress Notes (Signed)
Established Patient Office Visit  Subjective:  Patient ID: Helen Mccormick, female    DOB: [birth year]  Age: 82 y.o. MRN: [id number]  Chief Complaint  Patient presents with   Follow-up    2 month follow up    Patient comes in for her follow-up today.  She is scheduled for a colonoscopy in December. 2 months ago she had a sharp drop in her hemoglobin, along with reports of black-colored stools and abdominal pain.  Hemoccult cards were positive for blood 3 / 3, thus a GI consult was requested. She did not have any further episodes of melena since then.  She was started on p.o. iron, so now her H&H has improved along with high ferritin. She can stop her iron supplement for now. At her last labs, alk phos was also elevated, will repeat today. Patient today is feeling well and has no new complaints.    No other concerns at this time.   Past Medical History:  Diagnosis Date   Allergy    Anxiety    CHF (congestive heart failure) (HCC)    Colon polyp    Depression    GERD (gastroesophageal reflux disease)    Heart murmur    Hyperlipidemia    Hypertension    Hypothyroidism    Sleep apnea    Thyroid disease     Past Surgical History:  Procedure Laterality Date   ABDOMINAL HYSTERECTOMY     BACK SURGERY     BACK SURGERY     CERVICAL FUSION     COLONOSCOPY WITH PROPOFOL N/A 05/28/2017   Procedure: COLONOSCOPY WITH PROPOFOL;  Surgeon: Wyline Mood, MD;  Location: [facility] ENDOSCOPY;  Service: Gastroenterology;  Laterality: N/A;   COLONOSCOPY WITH PROPOFOL N/A 11/17/2019   Procedure: COLONOSCOPY WITH PROPOFOL;  Surgeon: Regis Bill, MD;  Location: [facility] ENDOSCOPY;  Service: Endoscopy;  Laterality: N/A;   ESOPHAGOGASTRODUODENOSCOPY (EGD) WITH PROPOFOL N/A 11/17/2019   Procedure: ESOPHAGOGASTRODUODENOSCOPY (EGD) WITH PROPOFOL;  Surgeon: Regis Bill, MD;  Location: [facility] ENDOSCOPY;  Service: Endoscopy;  Laterality: N/A;   IR KYPHO THORACIC WITH BONE BIOPSY  10/20/2022    IR RADIOLOGIST EVAL & MGMT  09/22/2022   NECK SURGERY     THYROID SURGERY     THYROID SURGERY      Social History   Socioeconomic History   Marital status: Single    Spouse name: Not on file   Number of children: Not on file   Years of education: Not on file   Highest education level: 10th grade  Occupational History   Occupation: retired  Tobacco Use   Smoking status: Never   Smokeless tobacco: Former    Types: Associate Professor status: Never Used  Substance and Sexual Activity   Alcohol use: No   Drug use: No   Sexual activity: Not Currently  Other Topics Concern   Not on file  Social History Narrative   Not on file   Social Determinants of Health   Financial Resource Strain: Low Risk  (04/27/2017)   Overall Financial Resource Strain (CARDIA)    Difficulty of Paying Living Expenses: Not hard at all  Food Insecurity: No Food Insecurity (04/27/2017)   Hunger Vital Sign    Worried About Running Out of Food in the Last Year: Never true    Ran Out of Food in the Last Year: Never true  Transportation Needs: No Transportation Needs (04/27/2017)   PRAPARE - Transportation    Lack of  Transportation (Medical): No    Lack of Transportation (Non-Medical): No  Physical Activity: Sufficiently Active (12/20/2018)   Exercise Vital Sign    Days of Exercise per Week: 4 days    Minutes of Exercise per Session: 60 min  Stress: No Stress Concern Present (03/12/2017)   [employer] of Occupational Health - Occupational Stress Questionnaire    Feeling of Stress : Only a little  Social Connections: Moderately Integrated (04/27/2017)   Social Connection and Isolation Panel [NHANES]    Frequency of Communication with Friends and Family: More than three times a week    Frequency of Social Gatherings with Friends and Family: Once a week    Attends Religious Services: More than 4 times per year    Active Member of [employer] or Organizations: Yes    Attends Banker  Meetings: More than 4 times per year    Marital Status: Never married  Intimate Partner Violence: Unknown (03/12/2017)   Humiliation, Afraid, Rape, and Kick questionnaire    Fear of Current or Ex-Partner: Patient declined    Emotionally Abused: Patient declined    Physically Abused: Patient declined    Sexually Abused: Patient declined    Family History  Problem Relation Age of Onset   Breast cancer Maternal Aunt 51   Diabetes Mother    Leukemia Mother    Heart Problems Mother    Diabetes Sister    Mental illness Sister    Colon polyps Sister    Stroke Father    Cancer Brother        rectal   Healthy Daughter    Hypertension Son    Diabetes Sister    Thyroid disease Sister    Healthy Sister    Healthy Sister    Stroke Brother    Healthy Son    Healthy Son    Healthy Daughter     Allergies  Allergen Reactions   Amlodipine Hives and Swelling   Tramadol Other (See Comments)    Dizziness    Isosorbide Dinitrate     Other Reaction(s): Headache   Codeine Nausea Only   Etodolac Other (See Comments)    Other reaction(s): Abdominal Pain    Review of Systems  Constitutional: Negative.  Negative for chills, diaphoresis, fever, malaise/fatigue and weight loss.  HENT: Negative.    Eyes: Negative.   Respiratory: Negative.  Negative for cough and shortness of breath.   Cardiovascular: Negative.  Negative for chest pain, palpitations and leg swelling.  Gastrointestinal: Negative.  Negative for abdominal pain, blood in stool, constipation, diarrhea, heartburn, melena, nausea and vomiting.  Genitourinary: Negative.  Negative for dysuria and flank pain.  Musculoskeletal: Negative.  Negative for joint pain and myalgias.  Skin: Negative.   Neurological: Negative.  Negative for dizziness and headaches.  Endo/Heme/Allergies: Negative.   Psychiatric/Behavioral: Negative.  Negative for depression and suicidal ideas. The patient is not nervous/anxious.        Objective:   BP  102/68   Pulse 78   Ht 5\' 4"  (1.626 m)   Wt 157 lb (71.2 kg)   LMP  (LMP Unknown)   SpO2 95%   BMI 26.95 kg/m   Vitals:   02/04/23 1112  BP: 102/68  Pulse: 78  Height: 5\' 4"  (1.626 m)  Weight: 157 lb (71.2 kg)  SpO2: 95%  BMI (Calculated): 26.94    Physical Exam Vitals and nursing note reviewed.  Constitutional:      Appearance: Normal appearance.  HENT:  Head: Normocephalic and atraumatic.     Nose: Nose normal.     Mouth/Throat:     Mouth: Mucous membranes are moist.     Pharynx: Oropharynx is clear.  Eyes:     Conjunctiva/sclera: Conjunctivae normal.     Pupils: Pupils are equal, round, and reactive to light.  Cardiovascular:     Rate and Rhythm: Normal rate and regular rhythm.     Pulses: Normal pulses.     Heart sounds: Normal heart sounds. No murmur heard. Pulmonary:     Effort: Pulmonary effort is normal.     Breath sounds: Normal breath sounds. No wheezing.  Abdominal:     General: Bowel sounds are normal.     Palpations: Abdomen is soft.     Tenderness: There is no abdominal tenderness. There is no right CVA tenderness or left CVA tenderness.  Musculoskeletal:        General: Normal range of motion.     Cervical back: Normal range of motion.     Right lower leg: No edema.     Left lower leg: No edema.  Skin:    General: Skin is warm and dry.  Neurological:     General: No focal deficit present.     Mental Status: She is alert and oriented to person, place, and time.  Psychiatric:        Mood and Affect: Mood normal.        Behavior: Behavior normal.      Results for orders placed or performed in visit on 02/04/23  POCT CBG (Fasting - Glucose)  Result Value Ref Range   Glucose Fasting, POC 129 (A) 70 - 99 mg/dL    Recent Results (from the past 2160 hour(s))  POC Hemoccult Bld/Stl (3-Cd Home Screen)     Status: Abnormal   Collection Time: 11/17/22 11:39 AM  Result Value Ref Range   Card #1 Date     Fecal Occult Blood, POC Positive (A)  Negative   Card #2 Date     Card #2 Fecal Occult Blod, POC Positive    Card #3 Date     Card #3 Fecal Occult Blood, POC Positive   CBC with Diff     Status: Abnormal   Collection Time: 12/04/22  3:05 PM  Result Value Ref Range   WBC 4.3 3.4 - 10.8 x10E3/uL   RBC 4.67 3.77 - 5.28 x10E6/uL   Hemoglobin 11.6 11.1 - 15.9 g/dL   Hematocrit 16.1 09.6 - 46.6 %   MCV 78 (L) 79 - 97 fL   MCH 24.8 (L) 26.6 - 33.0 pg   MCHC 31.9 31.5 - 35.7 g/dL   RDW 04.5 (H) 40.9 - 81.1 %   Platelets 181 150 - 450 x10E3/uL   Neutrophils 35 Not Estab. %   Lymphs 54 Not Estab. %   Monocytes 7 Not Estab. %   Eos 3 Not Estab. %   Basos 1 Not Estab. %   Neutrophils Absolute 1.5 1.4 - 7.0 x10E3/uL   Lymphocytes Absolute 2.3 0.7 - 3.1 x10E3/uL   Monocytes Absolute 0.3 0.1 - 0.9 x10E3/uL   EOS (ABSOLUTE) 0.1 0.0 - 0.4 x10E3/uL   Basophils Absolute 0.0 0.0 - 0.2 x10E3/uL   Immature Granulocytes 0 Not Estab. %   Immature Grans (Abs) 0.0 0.0 - 0.1 x10E3/uL  Iron, TIBC and Ferritin Panel     Status: Abnormal   Collection Time: 01/28/23  3:21 PM  Result Value Ref Range   Total Iron  Binding Capacity 294 250 - 450 ug/dL   UIBC 161 096 - 045 ug/dL   Iron 409 (H) 27 - 811 ug/dL   Iron Saturation 50 15 - 55 %   Ferritin 184 (H) 15 - 150 ng/mL  B12 and Folate Panel     Status: None   Collection Time: 01/28/23  3:21 PM  Result Value Ref Range   Vitamin B-12 715 232 - 1,245 pg/mL   Folate >20.0 >3.0 ng/mL    Comment: A serum folate concentration of less than 3.1 ng/mL is considered to represent clinical deficiency.   CBC with Differential/Platelet     Status: Abnormal   Collection Time: 01/28/23  3:21 PM  Result Value Ref Range   WBC 5.3 3.4 - 10.8 x10E3/uL   RBC 5.24 3.77 - 5.28 x10E6/uL   Hemoglobin 12.5 11.1 - 15.9 g/dL   Hematocrit 91.4 78.2 - 46.6 %   MCV 80 79 - 97 fL   MCH 23.9 (L) 26.6 - 33.0 pg   MCHC 29.9 (L) 31.5 - 35.7 g/dL   RDW 95.6 (H) 21.3 - 08.6 %   Platelets 225 150 - 450 x10E3/uL    Neutrophils 31 Not Estab. %   Lymphs 58 Not Estab. %   Monocytes 7 Not Estab. %   Eos 3 Not Estab. %   Basos 1 Not Estab. %   Neutrophils Absolute 1.7 1.4 - 7.0 x10E3/uL   Lymphocytes Absolute 3.1 0.7 - 3.1 x10E3/uL   Monocytes Absolute 0.4 0.1 - 0.9 x10E3/uL   EOS (ABSOLUTE) 0.2 0.0 - 0.4 x10E3/uL   Basophils Absolute 0.0 0.0 - 0.2 x10E3/uL   Immature Granulocytes 0 Not Estab. %   Immature Grans (Abs) 0.0 0.0 - 0.1 x10E3/uL  Comprehensive Metabolic Panel (CMET)     Status: Abnormal   Collection Time: 01/28/23  3:21 PM  Result Value Ref Range   Glucose 95 70 - 99 mg/dL   BUN 19 8 - 27 mg/dL   Creatinine, Ser 5.78 0.57 - 1.00 mg/dL   eGFR 58 (L) >46 NG/EXB/2.84   BUN/Creatinine Ratio 19 12 - 28   Sodium 147 (H) 134 - 144 mmol/L   Potassium 4.0 3.5 - 5.2 mmol/L   Chloride 108 (H) 96 - 106 mmol/L   CO2 24 20 - 29 mmol/L   Calcium 9.8 8.7 - 10.3 mg/dL   Total Protein 7.3 6.0 - 8.5 g/dL   Albumin 4.6 3.8 - 4.8 g/dL   Globulin, Total 2.7 1.5 - 4.5 g/dL   Bilirubin Total 0.4 0.0 - 1.2 mg/dL   Alkaline Phosphatase 190 (H) 44 - 121 IU/L   AST 39 0 - 40 IU/L   ALT 35 (H) 0 - 32 IU/L  POCT CBG (Fasting - Glucose)     Status: Abnormal   Collection Time: 02/04/23 11:20 AM  Result Value Ref Range   Glucose Fasting, POC 129 (A) 70 - 99 mg/dL      Assessment & Plan:  Continue medications. Can stop iron supplement. Check for sickle cell trait. Problem List Items Addressed This Visit     DM (diabetes mellitus), type 2 (HCC)   Relevant Medications   rosuvastatin (CRESTOR) 40 MG tablet   Other Relevant Orders   POCT CBG (Fasting - Glucose) (Completed)   Microcytic anemia   Relevant Orders   Sickle cell screen   Essential hypertension, benign - Primary   Relevant Medications   rosuvastatin (CRESTOR) 40 MG tablet   Mixed hyperlipidemia   Relevant Medications  rosuvastatin (CRESTOR) 40 MG tablet   Coronary artery disease of native artery of native heart with stable angina  pectoris (HCC)   Relevant Medications   rosuvastatin (CRESTOR) 40 MG tablet   gabapentin (NEURONTIN) 100 MG capsule   Other Visit Diagnoses     Elevated liver enzymes       Relevant Orders   Liver Profile   Family history of sickle cell anemia       Relevant Orders   Sickle Cell Panel   Need for immunization against influenza       Relevant Orders   Flu Vaccine Trivalent High Dose (Fluad) (Completed)       Return in about 1 month (around 03/06/2023).   Total time spent: 30 minutes  [name], MD  02/04/2023   This document may have been prepared by [facility] Voice Recognition software and as such may include unintentional dictation errors.

## 2023-02-05 ENCOUNTER — Telehealth: Payer: Self-pay | Admitting: Internal Medicine

## 2023-02-05 DIAGNOSIS — E119 Type 2 diabetes mellitus without complications: Secondary | ICD-10-CM | POA: Diagnosis not present

## 2023-02-05 DIAGNOSIS — M5416 Radiculopathy, lumbar region: Secondary | ICD-10-CM | POA: Diagnosis not present

## 2023-02-05 NOTE — Telephone Encounter (Signed)
Patient's granddaughter left VM wanting to clarify what meds the patient should be taking.

## 2023-02-06 LAB — CMP14+CBC/D/PLT+FER+RETIC+V...
ALT: 28 [IU]/L (ref 0–32)
AST: 29 [IU]/L (ref 0–40)
Albumin: 4.3 g/dL (ref 3.8–4.8)
Alkaline Phosphatase: 185 [IU]/L — ABNORMAL HIGH (ref 44–121)
BUN/Creatinine Ratio: 21 (ref 12–28)
BUN: 21 mg/dL (ref 8–27)
Basophils Absolute: 0 10*3/uL (ref 0.0–0.2)
Basos: 0 %
Bilirubin Total: 0.3 mg/dL (ref 0.0–1.2)
CO2: 25 mmol/L (ref 20–29)
Calcium: 9.1 mg/dL (ref 8.7–10.3)
Chloride: 106 mmol/L (ref 96–106)
Creatinine, Ser: 1.01 mg/dL — ABNORMAL HIGH (ref 0.57–1.00)
EOS (ABSOLUTE): 0.1 10*3/uL (ref 0.0–0.4)
Eos: 3 %
Ferritin: 173 ng/mL — ABNORMAL HIGH (ref 15–150)
Globulin, Total: 2.3 g/dL (ref 1.5–4.5)
Glucose: 106 mg/dL — ABNORMAL HIGH (ref 70–99)
Hematocrit: 39 % (ref 34.0–46.6)
Hemoglobin: 11.9 g/dL (ref 11.1–15.9)
Immature Grans (Abs): 0 10*3/uL (ref 0.0–0.1)
Immature Granulocytes: 0 %
Lymphocytes Absolute: 2.6 10*3/uL (ref 0.7–3.1)
Lymphs: 56 %
MCH: 24.6 pg — ABNORMAL LOW (ref 26.6–33.0)
MCHC: 30.5 g/dL — ABNORMAL LOW (ref 31.5–35.7)
MCV: 81 fL (ref 79–97)
Monocytes Absolute: 0.3 10*3/uL (ref 0.1–0.9)
Monocytes: 6 %
Neutrophils Absolute: 1.6 10*3/uL (ref 1.4–7.0)
Neutrophils: 35 %
Platelets: 243 10*3/uL (ref 150–450)
Potassium: 3.8 mmol/L (ref 3.5–5.2)
RBC: 4.83 x10E6/uL (ref 3.77–5.28)
RDW: 16 % — ABNORMAL HIGH (ref 11.7–15.4)
Retic Ct Pct: 1.4 % (ref 0.6–2.6)
Sodium: 145 mmol/L — ABNORMAL HIGH (ref 134–144)
Total Protein: 6.6 g/dL (ref 6.0–8.5)
Vit D, 25-Hydroxy: 25.3 ng/mL — ABNORMAL LOW (ref 30.0–100.0)
WBC: 4.6 10*3/uL (ref 3.4–10.8)
eGFR: 56 mL/min/{1.73_m2} — ABNORMAL LOW (ref >59)

## 2023-02-06 LAB — SICKLE CELL SCREEN: Sickle Cell Screen: NEGATIVE

## 2023-02-06 LAB — HEPATIC FUNCTION PANEL: Bilirubin, Direct: 0.11 mg/dL (ref 0.00–0.40)

## 2023-02-08 ENCOUNTER — Other Ambulatory Visit: Payer: Self-pay

## 2023-02-08 MED ORDER — ENTRESTO 49-51 MG PO TABS: 1.0000 | ORAL_TABLET | Freq: Two times a day (BID) | ORAL | 3 refills | Status: DC

## 2023-02-10 ENCOUNTER — Other Ambulatory Visit: Payer: Self-pay

## 2023-02-10 ENCOUNTER — Other Ambulatory Visit: Payer: Self-pay | Admitting: Internal Medicine

## 2023-02-10 DIAGNOSIS — R600 Localized edema: Secondary | ICD-10-CM

## 2023-02-11 MED ORDER — FUROSEMIDE 20 MG PO TABS: ORAL_TABLET | ORAL | 0 refills | Status: DC

## 2023-02-12 ENCOUNTER — Other Ambulatory Visit: Payer: Self-pay

## 2023-02-12 DIAGNOSIS — E1169 Type 2 diabetes mellitus with other specified complication: Secondary | ICD-10-CM

## 2023-02-15 ENCOUNTER — Other Ambulatory Visit: Payer: Self-pay | Admitting: Internal Medicine

## 2023-02-15 DIAGNOSIS — E1169 Type 2 diabetes mellitus with other specified complication: Secondary | ICD-10-CM

## 2023-02-17 ENCOUNTER — Telehealth: Payer: Self-pay

## 2023-02-17 NOTE — Telephone Encounter (Signed)
Mary from [facility] called to verify and get verbal order for Furosemide 20 mg take one by mouth Monday, Wednesday, and Friday. Reference # [id number]

## 2023-02-18 ENCOUNTER — Other Ambulatory Visit: Payer: Self-pay

## 2023-02-18 DIAGNOSIS — R600 Localized edema: Secondary | ICD-10-CM

## 2023-02-19 ENCOUNTER — Other Ambulatory Visit: Payer: Self-pay | Admitting: Internal Medicine

## 2023-02-19 ENCOUNTER — Telehealth: Payer: Self-pay | Admitting: Internal Medicine

## 2023-02-19 DIAGNOSIS — M5416 Radiculopathy, lumbar region: Secondary | ICD-10-CM | POA: Diagnosis not present

## 2023-02-19 DIAGNOSIS — E1169 Type 2 diabetes mellitus with other specified complication: Secondary | ICD-10-CM

## 2023-02-19 DIAGNOSIS — M5412 Radiculopathy, cervical region: Secondary | ICD-10-CM | POA: Diagnosis not present

## 2023-02-19 DIAGNOSIS — G8929 Other chronic pain: Secondary | ICD-10-CM | POA: Diagnosis not present

## 2023-02-19 DIAGNOSIS — M5441 Lumbago with sciatica, right side: Secondary | ICD-10-CM | POA: Diagnosis not present

## 2023-02-19 DIAGNOSIS — M542 Cervicalgia: Secondary | ICD-10-CM | POA: Diagnosis not present

## 2023-02-19 MED ORDER — ACCU-CHEK SOFTCLIX LANCETS MISC: 12 refills | Status: AC

## 2023-02-19 MED ORDER — FUROSEMIDE 20 MG PO TABS: ORAL_TABLET | ORAL | 3 refills | Status: DC

## 2023-02-19 NOTE — Telephone Encounter (Signed)
Patient stopped by the office requesting some lancets be sent in so that she can check her blood sugar. She has been out for two months and hasn't been able to check her sugar. She would prefer the single use lancets that are disposable.   [facility]

## 2023-02-22 ENCOUNTER — Ambulatory Visit: Payer: 59 | Admitting: Cardiovascular Disease

## 2023-03-01 ENCOUNTER — Other Ambulatory Visit: Payer: Self-pay | Admitting: Gastroenterology

## 2023-03-02 ENCOUNTER — Other Ambulatory Visit: Payer: Self-pay

## 2023-03-02 MED ORDER — NA SULFATE-K SULFATE-MG SULF 17.5-3.13-1.6 GM/177ML PO SOLN
354.0000 mL | Freq: Once | ORAL | 0 refills | Status: AC
Start: 2023-03-02 — End: 2023-03-02

## 2023-03-04 ENCOUNTER — Telehealth: Payer: Self-pay

## 2023-03-04 NOTE — Telephone Encounter (Signed)
Betty from Dr. Welton Flakes office called and left a voicemail and states at this time they can not sign off on the Cardiac clearance because patient has not been seen since August and Dr. Welton Flakes states she needs a appointment. They said they would be calling her to try to get in the office as soon as possible.

## 2023-03-04 NOTE — Telephone Encounter (Signed)
Called patient's niece-Kendra to notify her that Dr. Welton Flakes did not give her cardiac clearance at the moment and that she needed to schedule an appointment. I let [name] know that once her aunt see's Dr. Welton Flakes and gets cardiac clearance, to please give [name] a call so we could reschedule her colonoscopy. [name] understood and had no further questions. I then called the endoscopy unit and spoke with Baxter Hire, RN and let her know that we needed patient's procedure to be cancelled until we received cardiac clearance.

## 2023-03-05 ENCOUNTER — Encounter: Payer: Self-pay | Admitting: Cardiovascular Disease

## 2023-03-05 ENCOUNTER — Ambulatory Visit (INDEPENDENT_AMBULATORY_CARE_PROVIDER_SITE_OTHER): Payer: 59 | Admitting: Cardiovascular Disease

## 2023-03-05 VITALS — BP 143/81 | HR 65 | Ht 64.0 in | Wt 156.4 lb

## 2023-03-05 DIAGNOSIS — I428 Other cardiomyopathies: Secondary | ICD-10-CM

## 2023-03-05 DIAGNOSIS — R42 Dizziness and giddiness: Secondary | ICD-10-CM

## 2023-03-05 DIAGNOSIS — E1169 Type 2 diabetes mellitus with other specified complication: Secondary | ICD-10-CM

## 2023-03-05 DIAGNOSIS — E785 Hyperlipidemia, unspecified: Secondary | ICD-10-CM | POA: Diagnosis not present

## 2023-03-05 DIAGNOSIS — I10 Essential (primary) hypertension: Secondary | ICD-10-CM

## 2023-03-05 MED ORDER — ENTRESTO 97-103 MG PO TABS: 1.0000 | ORAL_TABLET | Freq: Two times a day (BID) | ORAL | 2 refills | Status: DC

## 2023-03-05 NOTE — Progress Notes (Signed)
Cardiology Office Note   Date:  03/05/2023   ID:  Helen Mccormick, DOB [birth year], MRN [id number]  PCP:  [name], MD  Cardiologist:  Adrian Blackwater, MD      History of Present Illness: Helen Mccormick is a 82 y.o. female who presents for No chief complaint on file.   Has knot on right side  Dizziness This is a recurrent problem. The current episode started more than 1 month ago. The problem has been waxing and waning. Pertinent negatives include no chest pain, congestion or weakness.      Past Medical History:  Diagnosis Date   Allergy    Anxiety    CHF (congestive heart failure) (HCC)    Colon polyp    Depression    GERD (gastroesophageal reflux disease)    Heart murmur    Hyperlipidemia    Hypertension    Hypothyroidism    Sleep apnea    Thyroid disease      Past Surgical History:  Procedure Laterality Date   ABDOMINAL HYSTERECTOMY     BACK SURGERY     BACK SURGERY     CERVICAL FUSION     COLONOSCOPY WITH PROPOFOL N/A 05/28/2017   Procedure: COLONOSCOPY WITH PROPOFOL;  Surgeon: Wyline Mood, MD;  Location: [facility] ENDOSCOPY;  Service: Gastroenterology;  Laterality: N/A;   COLONOSCOPY WITH PROPOFOL N/A 11/17/2019   Procedure: COLONOSCOPY WITH PROPOFOL;  Surgeon: Regis Bill, MD;  Location: [facility] ENDOSCOPY;  Service: Endoscopy;  Laterality: N/A;   ESOPHAGOGASTRODUODENOSCOPY (EGD) WITH PROPOFOL N/A 11/17/2019   Procedure: ESOPHAGOGASTRODUODENOSCOPY (EGD) WITH PROPOFOL;  Surgeon: Regis Bill, MD;  Location: [facility] ENDOSCOPY;  Service: Endoscopy;  Laterality: N/A;   IR KYPHO THORACIC WITH BONE BIOPSY  10/20/2022   IR RADIOLOGIST EVAL & MGMT  09/22/2022   NECK SURGERY     THYROID SURGERY     THYROID SURGERY       Current Outpatient Medications  Medication Sig Dispense Refill   sacubitril-valsartan (ENTRESTO) 97-103 MG Take 1 tablet by mouth 2 (two) times daily. 60 tablet 2   Accu-Chek Softclix Lancets lancets Use as instructed 100 each 12    acetaminophen (TYLENOL) 650 MG CR tablet Take 650 mg by mouth every 8 (eight) hours as needed for pain. (Patient not taking: Reported on 02/04/2023)     albuterol (VENTOLIN HFA) 108 (90 Base) MCG/ACT inhaler Inhale 2 puffs into the lungs every 6 (six) hours as needed for wheezing or shortness of breath. 1 each 6   aspirin 81 MG chewable tablet Chew 81 mg by mouth daily. (Patient not taking: Reported on 02/04/2023)     Azelastine HCl 137 MCG/SPRAY SOLN Use 1 spray into each nostril every day as directed 11.1 mL 11   Blood Glucose Monitoring Suppl (ACCU-CHEK GUIDE) w/Device KIT See admin instructions. (Patient not taking: Reported on 02/04/2023)     Cholecalciferol (VITAMIN D-3) 25 MCG (1000 UT) CAPS Take by mouth. (Patient not taking: Reported on 02/04/2023)     clotrimazole-betamethasone (LOTRISONE) cream Apply 1 Application topically 2 (two) times daily. (Patient not taking: Reported on 02/04/2023) 30 g 0   empagliflozin (JARDIANCE) 25 MG TABS tablet Take 1 tablet (25 mg total) by mouth daily. (Patient not taking: Reported on 02/04/2023) 90 tablet 3   escitalopram (LEXAPRO) 10 MG tablet Take 1 tablet by mouth daily 30 tablet 11   FARXIGA 10 MG TABS tablet Take 10 mg by mouth daily. (Patient not taking: Reported on 02/04/2023)  fluticasone (FLOVENT HFA) 110 MCG/ACT inhaler Inhale 1 puff into the lungs 2 (two) times daily. (Patient not taking: Reported on 02/04/2023)     Fluticasone Furoate (ARNUITY ELLIPTA) 100 MCG/ACT AEPB Inhale into the lungs.     furosemide (LASIX) 20 MG tablet TAKE 1 TABLET BY MOUTH MONDAY WEDNESDAY FRIDAY 30 tablet 3   gabapentin (NEURONTIN) 100 MG capsule Take 100 mg by mouth 3 (three) times daily.     ibuprofen (ADVIL) 200 MG tablet Take 200 mg by mouth every 6 (six) hours as needed. (Patient not taking: Reported on 02/04/2023)     isosorbide dinitrate (ISORDIL) 30 MG tablet Take 1 tablet by mouth daily 30 tablet 11   levothyroxine (SYNTHROID) 75 MCG tablet Take 1 tablet (75 mcg  total) by mouth daily. 90 tablet 3   levothyroxine (SYNTHROID) 75 MCG tablet Take 1 tablet (75 mcg total) by mouth daily. (Patient not taking: Reported on 02/04/2023) 30 tablet 11   meclizine (ANTIVERT) 12.5 MG tablet Take 1 tablet (12.5 mg total) by mouth 3 (three) times daily as needed for dizziness. (Patient not taking: Reported on 02/04/2023) 90 tablet 0   nitroGLYCERIN (NITROSTAT) 0.4 MG SL tablet Place 1 tablet (0.4 mg total) under the tongue every 5 (five) minutes as needed for chest pain. (Patient not taking: Reported on 02/04/2023) 50 tablet 3   Omega-3 Fatty Acids (FISH OIL) 1000 MG CAPS Take by mouth. (Patient not taking: Reported on 02/04/2023)     ONETOUCH VERIO test strip USE TO CHECK BLOOD SUGAR TWICE DAILY 100 strip 6   oxyCODONE (OXY IR/ROXICODONE) 5 MG immediate release tablet Take 1 tablet (5 mg total) by mouth every 8 (eight) hours as needed for severe pain (pain score 7-10). (Patient not taking: Reported on 02/04/2023) 8 tablet 0   pantoprazole (PROTONIX) 40 MG tablet Take one tablet by mouth daily 30 tablet 11   polyethylene glycol powder (GLYCOLAX/MIRALAX) 17 GM/SCOOP powder  (Patient not taking: Reported on 02/04/2023)     RESTASIS 0.05 % ophthalmic emulsion Place 1 drop into both eyes 2 (two) times daily.     rosuvastatin (CRESTOR) 40 MG tablet Take 40 mg by mouth daily.     Sod Picosulfate-Mag Ox-Cit Acd (CLENPIQ) 10-3.5-12 MG-GM -GM/160ML SOLN Take 1 bottle at 5 PM followed by five 8 oz cups of water and repeat 5 hours before procedure. (Patient not taking: Reported on 02/04/2023) 320 mL 0   No current facility-administered medications for this visit.    Allergies:   Amlodipine, Tramadol, Isosorbide dinitrate, Codeine, and Etodolac    Social History:   reports that she has never smoked. She has quit using smokeless tobacco.  Her smokeless tobacco use included chew. She reports that she does not drink alcohol and does not use drugs.   Family History:  family history includes  Breast cancer (age of onset: 22) in her maternal aunt; Cancer in her brother; Colon polyps in her sister; Diabetes in her mother, sister, and sister; Healthy in her daughter, daughter, sister, sister, son, and son; Heart Problems in her mother; Hypertension in her son; Leukemia in her mother; Mental illness in her sister; Stroke in her brother and father; Thyroid disease in her sister.    ROS:     Review of Systems  Constitutional: Negative.   HENT: Negative.  Negative for congestion.   Eyes: Negative.   Respiratory: Negative.    Cardiovascular:  Negative for chest pain.  Gastrointestinal: Negative.   Genitourinary: Negative.   Musculoskeletal: Negative.  Skin: Negative.   Neurological:  Positive for dizziness. Negative for weakness.  Endo/Heme/Allergies: Negative.   Psychiatric/Behavioral: Negative.    All other systems reviewed and are negative.     All other systems are reviewed and negative.    PHYSICAL EXAM: VS:  BP (!) 143/81   Pulse 65   Ht 5\' 4"  (1.626 m)   Wt 156 lb 6.4 oz (70.9 kg)   LMP  (LMP Unknown)   SpO2 97%   BMI 26.85 kg/m  , BMI Body mass index is 26.85 kg/m. Last weight:  Wt Readings from Last 3 Encounters:  03/05/23 156 lb 6.4 oz (70.9 kg)  02/04/23 157 lb (71.2 kg)  02/02/23 160 lb (72.6 kg)     Physical Exam Constitutional:      Appearance: Normal appearance.  Cardiovascular:     Rate and Rhythm: Normal rate and regular rhythm.     Heart sounds: Normal heart sounds.  Pulmonary:     Effort: Pulmonary effort is normal.     Breath sounds: Normal breath sounds.  Musculoskeletal:     Right lower leg: No edema.     Left lower leg: No edema.  Neurological:     Mental Status: She is alert.       EKG:   Recent Labs: 07/01/2022: Magnesium 2.2 09/01/2022: TSH 0.314 02/04/2023: ALT 28; BUN 21; Creatinine, Ser 1.01; Hemoglobin 11.9; Platelets 243; Potassium 3.8; Sodium 145    Lipid Panel    Component Value Date/Time   CHOL 113 11/02/2022  1029   TRIG 87 11/02/2022 1029   HDL 39 (L) 11/02/2022 1029   CHOLHDL 2.8 11/08/2018 0806   VLDL 23 08/16/2017 0528   LDLCALC 57 11/02/2022 1029   LDLCALC 60 11/08/2018 0806      Other studies Reviewed: Additional studies/ records that were reviewed today include:  Review of the above records demonstrates:      09/27/2019    8:55 AM  PAD Screen  Previous PAD dx? No  Previous surgical procedure? No  Pain with walking? No  Feet/toe relief with dangling? No  Painful, non-healing ulcers? No  Extremities discolored? Yes      ASSESSMENT AND PLAN:    ICD-10-CM   1. Nonischemic cardiomyopathy (HCC)  I42.8 sacubitril-valsartan (ENTRESTO) 97-103 MG   doing much better, no SOB. BP HIGH SO WILL INCREASE DOSAGE ENTREST TO 97 MG    2. Primary hypertension  I10 sacubitril-valsartan (ENTRESTO) 97-103 MG    3. Essential hypertension, benign  I10 sacubitril-valsartan (ENTRESTO) 97-103 MG    4. Hyperlipidemia due to type 2 diabetes mellitus (HCC)  E11.69 sacubitril-valsartan (ENTRESTO) 97-103 MG   E78.5     5. Type 2 diabetes mellitus with other specified complication, without long-term current use of insulin (HCC)  E11.69 sacubitril-valsartan (ENTRESTO) 97-103 MG    6. Dizziness  R42 sacubitril-valsartan (ENTRESTO) 97-103 MG    7. Essential hypertension  I10 sacubitril-valsartan (ENTRESTO) 97-103 MG       Problem List Items Addressed This Visit       Cardiovascular and Mediastinum   Hypertension   Relevant Medications   sacubitril-valsartan (ENTRESTO) 97-103 MG   Nonischemic cardiomyopathy (HCC) - Primary   Relevant Medications   sacubitril-valsartan (ENTRESTO) 97-103 MG   Essential hypertension, benign   Relevant Medications   sacubitril-valsartan (ENTRESTO) 97-103 MG     Endocrine   DM (diabetes mellitus), type 2 (HCC)   Relevant Medications   sacubitril-valsartan (ENTRESTO) 97-103 MG   Hyperlipidemia due to type 2 diabetes  mellitus (HCC)   Relevant Medications    sacubitril-valsartan (ENTRESTO) 97-103 MG     Other   Dizziness   Relevant Medications   sacubitril-valsartan (ENTRESTO) 97-103 MG   Other Visit Diagnoses     Essential hypertension       Relevant Medications   sacubitril-valsartan (ENTRESTO) 97-103 MG          Disposition:   Return in about 2 months (around 05/06/2023).    Total time spent: 30 minutes  Signed,  Adrian Blackwater, MD  03/05/2023 2:30 PM    [facility]

## 2023-03-05 NOTE — Progress Notes (Deleted)
Referring Physician:  [name], MD [address] [place],  [place] [address]  Primary Physician:  [name], MD  History of Present Illness: Helen Mccormick is a 82 y.o. female has a history of HTN, nonischemic cardiomyopathy, OSA, asthma, gastritis, DM, hyperlipidemia, hypothyroidism, and macular degeneration both eyes.    She had T12 kyphoplasty by IR on 10/20/22. This helped her mid back pain. History of cervical fusion years ago. History of lumbar surgery years ago.   Phone visit done on 01/18/23. She is a poor historian.   She has moderate central stenosis C2-C3 with right foraminal stenosis, fusion C3-C4, and right foraminal stenosis C4-C5 with question of myelomalacia on left and mild central stenosis.   She has known lumbar spondylosis with slip L3-L4 and L4-L5 along with facet hypertrophy and bilateral foraminal stenosis L3-S1. LBP is likely from underlying spondylosis.    Cervical MRI reviewed with Dr. Myer Haff regarding question of myelomalacia at C4-C5. Would follow for now unless she has worsening of myelopathic symptoms.    May consider EMG at some point if she continues with numbness in left hand.   She was sent back to Dr. Mariah Milling to discuss possible lumbar injections. She had bilateral L5-S1 TF ESI on 02/05/23 with great improvement. Advised to call back for right C5-C6 TF ESI when needed.   She is here for follow up.     She has intermittent neck pain with left arm pain to her elbow. She has numbness in left hand. She notes she is dropping things. She has some issue with hand dexterity. Her balance is not great, but has not gotten worse.    She also has constant LBP with right lateral leg pain to her ankle. She also has some pain from her knees into her ankles on both legs. Pain is worse with standing and walking. Her lower back is her primary complaint.    No bowel or bladder issues.     History of cervical fusion 4 years ago- she got better after  the surgery.    Conservative measures:  Physical therapy:  denies Multimodal medical therapy including regular antiinflammatories: oxycodone, flexeril, mobic  Injections: -s/p bilateral L5-S1 TFESI 02/05/2023 with 90% improvement  -s/p right C5-6 TFESI 05/29/2022 with 70% relief -s/p left L4-5 and L5-S1 TFESI 03/16/2022 with 100 % improvement   Past Surgery:  T12 kyphoplasty by IR on 10/20/22 Cervical fusion 4 years ago.   Baldwin Crown Cronic has no symptoms of cervical myelopathy.  The symptoms are causing a significant impact on the patient's life.   Review of Systems:  A 10 point review of systems is negative, except for the pertinent positives and negatives detailed in the HPI.  Past Medical History: Past Medical History:  Diagnosis Date   Allergy    Anxiety    CHF (congestive heart failure) (HCC)    Colon polyp    Depression    GERD (gastroesophageal reflux disease)    Heart murmur    Hyperlipidemia    Hypertension    Hypothyroidism    Sleep apnea    Thyroid disease     Past Surgical History: Past Surgical History:  Procedure Laterality Date   ABDOMINAL HYSTERECTOMY     BACK SURGERY     BACK SURGERY     CERVICAL FUSION     COLONOSCOPY WITH PROPOFOL N/A 05/28/2017   Procedure: COLONOSCOPY WITH PROPOFOL;  Surgeon: Wyline Mood, MD;  Location: [facility] ENDOSCOPY;  Service: Gastroenterology;  Laterality: N/A;  COLONOSCOPY WITH PROPOFOL N/A 11/17/2019   Procedure: COLONOSCOPY WITH PROPOFOL;  Surgeon: Regis Bill, MD;  Location: [facility] ENDOSCOPY;  Service: Endoscopy;  Laterality: N/A;   ESOPHAGOGASTRODUODENOSCOPY (EGD) WITH PROPOFOL N/A 11/17/2019   Procedure: ESOPHAGOGASTRODUODENOSCOPY (EGD) WITH PROPOFOL;  Surgeon: Regis Bill, MD;  Location: [facility] ENDOSCOPY;  Service: Endoscopy;  Laterality: N/A;   IR KYPHO THORACIC WITH BONE BIOPSY  10/20/2022   IR RADIOLOGIST EVAL & MGMT  09/22/2022   NECK SURGERY     THYROID SURGERY     THYROID SURGERY       Allergies: Allergies as of 03/10/2023 - Review Complete 03/05/2023  Allergen Reaction Noted   Amlodipine Hives and Swelling 12/24/2014   Tramadol Other (See Comments) 06/14/2019   Isosorbide dinitrate  07/06/2018   Codeine Nausea Only 09/11/2020   Etodolac Other (See Comments) 04/22/2016    Medications: Outpatient Encounter Medications as of 03/10/2023  Medication Sig   Accu-Chek Softclix Lancets lancets Use as instructed   acetaminophen (TYLENOL) 650 MG CR tablet Take 650 mg by mouth every 8 (eight) hours as needed for pain. (Patient not taking: Reported on 02/04/2023)   albuterol (VENTOLIN HFA) 108 (90 Base) MCG/ACT inhaler Inhale 2 puffs into the lungs every 6 (six) hours as needed for wheezing or shortness of breath.   aspirin 81 MG chewable tablet Chew 81 mg by mouth daily. (Patient not taking: Reported on 02/04/2023)   Azelastine HCl 137 MCG/SPRAY SOLN Use 1 spray into each nostril every day as directed   Blood Glucose Monitoring Suppl (ACCU-CHEK GUIDE) w/Device KIT See admin instructions. (Patient not taking: Reported on 02/04/2023)   Cholecalciferol (VITAMIN D-3) 25 MCG (1000 UT) CAPS Take by mouth. (Patient not taking: Reported on 02/04/2023)   clotrimazole-betamethasone (LOTRISONE) cream Apply 1 Application topically 2 (two) times daily. (Patient not taking: Reported on 02/04/2023)   empagliflozin (JARDIANCE) 25 MG TABS tablet Take 1 tablet (25 mg total) by mouth daily. (Patient not taking: Reported on 02/04/2023)   escitalopram (LEXAPRO) 10 MG tablet Take 1 tablet by mouth daily   FARXIGA 10 MG TABS tablet Take 10 mg by mouth daily. (Patient not taking: Reported on 02/04/2023)   fluticasone (FLOVENT HFA) 110 MCG/ACT inhaler Inhale 1 puff into the lungs 2 (two) times daily. (Patient not taking: Reported on 02/04/2023)   Fluticasone Furoate (ARNUITY ELLIPTA) 100 MCG/ACT AEPB Inhale into the lungs.   furosemide (LASIX) 20 MG tablet TAKE 1 TABLET BY MOUTH MONDAY WEDNESDAY FRIDAY    gabapentin (NEURONTIN) 100 MG capsule Take 100 mg by mouth 3 (three) times daily.   ibuprofen (ADVIL) 200 MG tablet Take 200 mg by mouth every 6 (six) hours as needed. (Patient not taking: Reported on 02/04/2023)   isosorbide dinitrate (ISORDIL) 30 MG tablet Take 1 tablet by mouth daily   levothyroxine (SYNTHROID) 75 MCG tablet Take 1 tablet (75 mcg total) by mouth daily.   levothyroxine (SYNTHROID) 75 MCG tablet Take 1 tablet (75 mcg total) by mouth daily. (Patient not taking: Reported on 02/04/2023)   meclizine (ANTIVERT) 12.5 MG tablet Take 1 tablet (12.5 mg total) by mouth 3 (three) times daily as needed for dizziness. (Patient not taking: Reported on 02/04/2023)   nitroGLYCERIN (NITROSTAT) 0.4 MG SL tablet Place 1 tablet (0.4 mg total) under the tongue every 5 (five) minutes as needed for chest pain. (Patient not taking: Reported on 02/04/2023)   Omega-3 Fatty Acids (FISH OIL) 1000 MG CAPS Take by mouth. (Patient not taking: Reported on 02/04/2023)   ONETOUCH VERIO test  strip USE TO CHECK BLOOD SUGAR TWICE DAILY   oxyCODONE (OXY IR/ROXICODONE) 5 MG immediate release tablet Take 1 tablet (5 mg total) by mouth every 8 (eight) hours as needed for severe pain (pain score 7-10). (Patient not taking: Reported on 02/04/2023)   pantoprazole (PROTONIX) 40 MG tablet Take one tablet by mouth daily   polyethylene glycol powder (GLYCOLAX/MIRALAX) 17 GM/SCOOP powder  (Patient not taking: Reported on 02/04/2023)   RESTASIS 0.05 % ophthalmic emulsion Place 1 drop into both eyes 2 (two) times daily.   rosuvastatin (CRESTOR) 40 MG tablet Take 40 mg by mouth daily.   sacubitril-valsartan (ENTRESTO) 49-51 MG Take 1 tablet by mouth 2 (two) times daily.   Sod Picosulfate-Mag Ox-Cit Acd (CLENPIQ) 10-3.5-12 MG-GM -GM/160ML SOLN Take 1 bottle at 5 PM followed by five 8 oz cups of water and repeat 5 hours before procedure. (Patient not taking: Reported on 02/04/2023)   No facility-administered encounter medications on file as  of 03/10/2023.    Social History: Social History   Tobacco Use   Smoking status: Never   Smokeless tobacco: Former    Types: Associate Professor status: Never Used  Substance Use Topics   Alcohol use: No   Drug use: No    Family Medical History: Family History  Problem Relation Age of Onset   Breast cancer Maternal Aunt 81   Diabetes Mother    Leukemia Mother    Heart Problems Mother    Diabetes Sister    Mental illness Sister    Colon polyps Sister    Stroke Father    Cancer Brother        rectal   Healthy Daughter    Hypertension Son    Diabetes Sister    Thyroid disease Sister    Healthy Sister    Healthy Sister    Stroke Brother    Healthy Son    Healthy Son    Healthy Daughter     Physical Examination: There were no vitals filed for this visit.  Awake, alert, oriented to person, place, and time.  Speech is clear and fluent. Fund of knowledge is appropriate.   Cranial Nerves: Pupils equal round and reactive to light.  Facial tone is symmetric.    Mild lower posterior cervical tenderness and mild right trapezial tenderness.   Well healed cervical incision.   No tenderness TL junction.  No right sided lower lumbar tenderness.   No abnormal lesions on exposed skin.   Strength: Side Biceps Triceps Deltoid Interossei Grip Wrist Ext. Wrist Flex.  R 5 5 5 5 5 5 5   L 5 5 5 5 5 5 5    Side Iliopsoas Quads Hamstring PF DF EHL  R 5 5 5 5 5 5   L 5 5 5 5 5 5    Reflexes are 2+ and symmetric at the biceps, brachioradialis, patella and achilles.   Hoffman's is absent bilaterally. Clonus is not present.   Bilateral upper and lower extremity sensation is intact to light touch.     No pain with IR/ER of both hips.   She has mild pain with limited ROM of right shoulder. Reasonable ROM of left shoulder with no pain.   She uses a cane to ambulate.    Medical Decision Making  Imaging: Cervical xrays dated 02/02/23:  FINDINGS: Cervical spine is  visualized to the level of T1. Stable C3-4 ACDF with osseous fusion across the disc space. Hardware is intact with stable alignment. No  acute fracture. Similar degenerative changes with disc height loss and endplate osteophytosis of the lower cervical spine, most pronounced at C4-C5. Prevertebral soft tissues are unremarkable.   IMPRESSION: 1. No acute fracture of the cervical spine. 2. Stable C3-4 ACDF with intact hardware. Similar degenerative changes of the lower cervical spine, most pronounced at C4-C5.     Electronically Signed   By: Hart Robinsons M.D.   On: 02/02/2023 10:06    I have personally reviewed the images and agree with the above interpretation.    Assessment and Plan: Ms. Silcott is a pleasant 82 y.o. female who had T12 kyphoplasty by IR on 10/20/22. She is here for follow up. This helped her mid back pain.   History of lumbar surgery years ago. History of cervical fusion 4 years ago- she got better after the surgery.   Now with 4-6 weeks weeks of intermittent right LBP/buttock pain. No leg pain noted. No left sided pain. No numbness, tingling, or weakness.   She has known lumbar spondylosis with slip L3-L4 and L4-L5 (more recent imaging was CT renal study).   Also with 4 weeks of constant  neck pain with left arm pain. No right arm pain. She has numbness, tingling, and weakness in her right arm.   CT from April shows ACDF C3-C4 with cervical spondylosis.   Treatment options discussed with patient and following plan made:   - MRI of cervical and lumbar spine for further evaluation. History of cervical and lumbar surgery with new pain.  - Depending on MRI results may consider PT and/or injections. She has seen Dr. Mariah Milling previously in [facility].  - She has some jaw pain/popping. Advised to follow up with PCP and/or dentist about this.  - Once I have MRI results back, will schedule phone visit with patient and her grand daughter to review.   I spent a total of 30  minutes in face-to-face and non-face-to-face activities related to this patient's care today including review of outside records, review of imaging, review of symptoms, physical exam, discussion of differential diagnosis, discussion of treatment options, and documentation.   Drake Leach PA-C Dept. of Neurosurgery

## 2023-03-08 ENCOUNTER — Ambulatory Visit (INDEPENDENT_AMBULATORY_CARE_PROVIDER_SITE_OTHER): Payer: 59 | Admitting: Internal Medicine

## 2023-03-08 ENCOUNTER — Encounter: Payer: Self-pay | Admitting: Internal Medicine

## 2023-03-08 ENCOUNTER — Encounter: Payer: Self-pay | Admitting: Gastroenterology

## 2023-03-08 ENCOUNTER — Encounter: Payer: Self-pay | Admitting: Cardiovascular Disease

## 2023-03-08 VITALS — BP 130/80 | HR 68 | Ht 64.0 in | Wt 158.4 lb

## 2023-03-08 DIAGNOSIS — D500 Iron deficiency anemia secondary to blood loss (chronic): Secondary | ICD-10-CM | POA: Diagnosis not present

## 2023-03-08 DIAGNOSIS — E785 Hyperlipidemia, unspecified: Secondary | ICD-10-CM | POA: Diagnosis not present

## 2023-03-08 DIAGNOSIS — E890 Postprocedural hypothyroidism: Secondary | ICD-10-CM

## 2023-03-08 DIAGNOSIS — E1169 Type 2 diabetes mellitus with other specified complication: Secondary | ICD-10-CM | POA: Diagnosis not present

## 2023-03-08 DIAGNOSIS — E1159 Type 2 diabetes mellitus with other circulatory complications: Secondary | ICD-10-CM | POA: Diagnosis not present

## 2023-03-08 DIAGNOSIS — E119 Type 2 diabetes mellitus without complications: Secondary | ICD-10-CM

## 2023-03-08 DIAGNOSIS — I152 Hypertension secondary to endocrine disorders: Secondary | ICD-10-CM | POA: Diagnosis not present

## 2023-03-08 DIAGNOSIS — E782 Mixed hyperlipidemia: Secondary | ICD-10-CM

## 2023-03-08 LAB — POC CREATINE & ALBUMIN,URINE
Creatinine, POC: 200 mg/dL
Microalbumin Ur, POC: 150 mg/L

## 2023-03-08 LAB — GLUCOSE, POCT (MANUAL RESULT ENTRY): POC Glucose: 100 mg/dL — AB (ref 70–99)

## 2023-03-09 ENCOUNTER — Encounter: Admission: RE | Payer: Self-pay | Source: Home / Self Care

## 2023-03-09 ENCOUNTER — Encounter: Payer: Self-pay | Admitting: Internal Medicine

## 2023-03-09 ENCOUNTER — Ambulatory Visit: Admission: RE | Admit: 2023-03-09 | Payer: 59 | Source: Home / Self Care | Admitting: Gastroenterology

## 2023-03-09 LAB — HEMOGLOBIN A1C
Est. average glucose Bld gHb Est-mCnc: 146 mg/dL
Hgb A1c MFr Bld: 6.7 % — ABNORMAL HIGH (ref 4.8–5.6)

## 2023-03-09 LAB — LIPID PANEL W/O CHOL/HDL RATIO
Cholesterol, Total: 146 mg/dL (ref 100–199)
HDL: 48 mg/dL (ref >39)
LDL Chol Calc (NIH): 85 mg/dL (ref 0–99)
Triglycerides: 63 mg/dL (ref 0–149)
VLDL Cholesterol Cal: 13 mg/dL (ref 5–40)

## 2023-03-09 SURGERY — COLONOSCOPY WITH PROPOFOL
Anesthesia: General

## 2023-03-09 NOTE — Progress Notes (Signed)
Established Patient Office Visit  Subjective:  Patient ID: Helen Mccormick, female    DOB: 11-22-1940  Age: 82 y.o. MRN: [id number]  Chief Complaint  Patient presents with   Follow-up    1 mo    Patient comes in for her follow-up today.  She is generally feeling well and has no new complaints.  Denies chest pain, no shortness of breath and no palpitations.  She is not having any further black-colored stools and her hemoglobin has improved.  She is scheduled for colonoscopy.  Needs to be checked for hemoglobin A1c and a lipid panel.    No other concerns at this time.   Past Medical History:  Diagnosis Date   Allergy    Anxiety    CHF (congestive heart failure) (HCC)    Colon polyp    Depression    GERD (gastroesophageal reflux disease)    Heart murmur    Hyperlipidemia    Hypertension    Hypothyroidism    Sleep apnea    Thyroid disease     Past Surgical History:  Procedure Laterality Date   ABDOMINAL HYSTERECTOMY     BACK SURGERY     BACK SURGERY     CERVICAL FUSION     COLONOSCOPY WITH PROPOFOL N/A 05/28/2017   Procedure: COLONOSCOPY WITH PROPOFOL;  Surgeon: Wyline Mood, MD;  Location: [facility] ENDOSCOPY;  Service: Gastroenterology;  Laterality: N/A;   COLONOSCOPY WITH PROPOFOL N/A 11/17/2019   Procedure: COLONOSCOPY WITH PROPOFOL;  Surgeon: Regis Bill, MD;  Location: [facility] ENDOSCOPY;  Service: Endoscopy;  Laterality: N/A;   ESOPHAGOGASTRODUODENOSCOPY (EGD) WITH PROPOFOL N/A 11/17/2019   Procedure: ESOPHAGOGASTRODUODENOSCOPY (EGD) WITH PROPOFOL;  Surgeon: Regis Bill, MD;  Location: [facility] ENDOSCOPY;  Service: Endoscopy;  Laterality: N/A;   IR KYPHO THORACIC WITH BONE BIOPSY  10/20/2022   IR RADIOLOGIST EVAL & MGMT  09/22/2022   NECK SURGERY     THYROID SURGERY     THYROID SURGERY      Social History   Socioeconomic History   Marital status: Single    Spouse name: Not on file   Number of children: Not on file   Years of education: Not on file    Highest education level: 10th grade  Occupational History   Occupation: retired  Tobacco Use   Smoking status: Never   Smokeless tobacco: Former    Types: Associate Professor status: Never Used  Substance and Sexual Activity   Alcohol use: No   Drug use: No   Sexual activity: Not Currently  Other Topics Concern   Not on file  Social History Narrative   Not on file   Social Determinants of Health   Financial Resource Strain: Low Risk  (04/27/2017)   Overall Financial Resource Strain (CARDIA)    Difficulty of Paying Living Expenses: Not hard at all  Food Insecurity: No Food Insecurity (04/27/2017)   Hunger Vital Sign    Worried About Running Out of Food in the Last Year: Never true    Ran Out of Food in the Last Year: Never true  Transportation Needs: No Transportation Needs (04/27/2017)   PRAPARE - Administrator, Civil Service (Medical): No    Lack of Transportation (Non-Medical): No  Physical Activity: Sufficiently Active (12/20/2018)   Exercise Vital Sign    Days of Exercise per Week: 4 days    Minutes of Exercise per Session: 60 min  Stress: No Stress Concern Present (03/12/2017)  [employer] of Occupational Health - Occupational Stress Questionnaire    Feeling of Stress : Only a little  Social Connections: Moderately Integrated (04/27/2017)   Social Connection and Isolation Panel [NHANES]    Frequency of Communication with Friends and Family: More than three times a week    Frequency of Social Gatherings with Friends and Family: Once a week    Attends Religious Services: More than 4 times per year    Active Member of [employer] or Organizations: Yes    Attends Banker Meetings: More than 4 times per year    Marital Status: Never married  Intimate Partner Violence: Unknown (03/12/2017)   Humiliation, Afraid, Rape, and Kick questionnaire    Fear of Current or Ex-Partner: Patient declined    Emotionally Abused: Patient declined     Physically Abused: Patient declined    Sexually Abused: Patient declined    Family History  Problem Relation Age of Onset   Breast cancer Maternal Aunt 20   Diabetes Mother    Leukemia Mother    Heart Problems Mother    Diabetes Sister    Mental illness Sister    Colon polyps Sister    Stroke Father    Cancer Brother        rectal   Healthy Daughter    Hypertension Son    Diabetes Sister    Thyroid disease Sister    Healthy Sister    Healthy Sister    Stroke Brother    Healthy Son    Healthy Son    Healthy Daughter     Allergies  Allergen Reactions   Amlodipine Hives and Swelling   Tramadol Other (See Comments)    Dizziness    Isosorbide Dinitrate     Other Reaction(s): Headache   Codeine Nausea Only   Etodolac Other (See Comments)    Other reaction(s): Abdominal Pain    Outpatient Medications Prior to Visit  Medication Sig   Accu-Chek Softclix Lancets lancets Use as instructed   acetaminophen (TYLENOL) 650 MG CR tablet Take 650 mg by mouth every 8 (eight) hours as needed for pain.   albuterol (VENTOLIN HFA) 108 (90 Base) MCG/ACT inhaler Inhale 2 puffs into the lungs every 6 (six) hours as needed for wheezing or shortness of breath.   Azelastine HCl 137 MCG/SPRAY SOLN Use 1 spray into each nostril every day as directed   Blood Glucose Monitoring Suppl (ACCU-CHEK GUIDE) w/Device KIT See admin instructions.   Cholecalciferol (VITAMIN D-3) 25 MCG (1000 UT) CAPS Take by mouth.   clotrimazole-betamethasone (LOTRISONE) cream Apply 1 Application topically 2 (two) times daily.   empagliflozin (JARDIANCE) 25 MG TABS tablet Take 1 tablet (25 mg total) by mouth daily.   FARXIGA 10 MG TABS tablet Take 10 mg by mouth daily.   fluticasone (FLOVENT HFA) 110 MCG/ACT inhaler Inhale 1 puff into the lungs 2 (two) times daily.   Fluticasone Furoate (ARNUITY ELLIPTA) 100 MCG/ACT AEPB Inhale into the lungs.   furosemide (LASIX) 20 MG tablet TAKE 1 TABLET BY MOUTH MONDAY WEDNESDAY  FRIDAY   gabapentin (NEURONTIN) 100 MG capsule Take 100 mg by mouth 3 (three) times daily.   isosorbide dinitrate (ISORDIL) 30 MG tablet Take 1 tablet by mouth daily   levothyroxine (SYNTHROID) 75 MCG tablet Take 1 tablet (75 mcg total) by mouth daily.   meclizine (ANTIVERT) 12.5 MG tablet Take 1 tablet (12.5 mg total) by mouth 3 (three) times daily as needed for dizziness.   nitroGLYCERIN (NITROSTAT) 0.4  MG SL tablet Place 1 tablet (0.4 mg total) under the tongue every 5 (five) minutes as needed for chest pain.   ONETOUCH VERIO test strip USE TO CHECK BLOOD SUGAR TWICE DAILY   oxyCODONE (OXY IR/ROXICODONE) 5 MG immediate release tablet Take 1 tablet (5 mg total) by mouth every 8 (eight) hours as needed for severe pain (pain score 7-10).   pantoprazole (PROTONIX) 40 MG tablet Take one tablet by mouth daily   polyethylene glycol powder (GLYCOLAX/MIRALAX) 17 GM/SCOOP powder    RESTASIS 0.05 % ophthalmic emulsion Place 1 drop into both eyes 2 (two) times daily.   rosuvastatin (CRESTOR) 40 MG tablet Take 40 mg by mouth daily.   sacubitril-valsartan (ENTRESTO) 97-103 MG Take 1 tablet by mouth 2 (two) times daily.   aspirin 81 MG chewable tablet Chew 81 mg by mouth daily. (Patient not taking: Reported on 03/08/2023)   escitalopram (LEXAPRO) 10 MG tablet Take 1 tablet by mouth daily   ibuprofen (ADVIL) 200 MG tablet Take 200 mg by mouth every 6 (six) hours as needed. (Patient not taking: Reported on 02/04/2023)   Omega-3 Fatty Acids (FISH OIL) 1000 MG CAPS Take by mouth. (Patient not taking: Reported on 02/04/2023)   Sod Picosulfate-Mag Ox-Cit Acd (CLENPIQ) 10-3.5-12 MG-GM -GM/160ML SOLN Take 1 bottle at 5 PM followed by five 8 oz cups of water and repeat 5 hours before procedure. (Patient not taking: Reported on 03/08/2023)   [DISCONTINUED] levothyroxine (SYNTHROID) 75 MCG tablet Take 1 tablet (75 mcg total) by mouth daily. (Patient not taking: Reported on 02/04/2023)   No facility-administered medications  prior to visit.    Review of Systems  Constitutional: Negative.  Negative for chills, fever, malaise/fatigue and weight loss.  HENT: Negative.  Negative for congestion, hearing loss, sinus pain and sore throat.   Eyes: Negative.   Respiratory: Negative.  Negative for cough, shortness of breath and stridor.   Cardiovascular: Negative.  Negative for chest pain, palpitations and leg swelling.  Gastrointestinal: Negative.  Negative for abdominal pain, constipation, diarrhea, heartburn, nausea and vomiting.  Genitourinary: Negative.  Negative for dysuria and flank pain.  Musculoskeletal: Negative.  Negative for joint pain and myalgias.  Skin: Negative.   Neurological: Negative.  Negative for dizziness, tingling, tremors, sensory change and headaches.  Endo/Heme/Allergies: Negative.   Psychiatric/Behavioral: Negative.  Negative for depression and suicidal ideas. The patient is not nervous/anxious.        Objective:   BP 130/80   Pulse 68   Ht 5\' 4"  (1.626 m)   Wt 158 lb 6.4 oz (71.8 kg)   LMP  (LMP Unknown)   SpO2 97%   BMI 27.19 kg/m   Vitals:   03/08/23 1101  BP: 130/80  Pulse: 68  Height: 5\' 4"  (1.626 m)  Weight: 158 lb 6.4 oz (71.8 kg)  SpO2: 97%  BMI (Calculated): 27.18    Physical Exam Vitals and nursing note reviewed.  Constitutional:      Appearance: Normal appearance.  HENT:     Head: Normocephalic and atraumatic.     Nose: Nose normal.     Mouth/Throat:     Mouth: Mucous membranes are moist.     Pharynx: Oropharynx is clear.  Eyes:     Conjunctiva/sclera: Conjunctivae normal.     Pupils: Pupils are equal, round, and reactive to light.  Cardiovascular:     Rate and Rhythm: Normal rate and regular rhythm.     Pulses: Normal pulses.     Heart sounds: Normal heart sounds.  No murmur heard. Pulmonary:     Effort: Pulmonary effort is normal.     Breath sounds: Normal breath sounds. No wheezing.  Abdominal:     General: Bowel sounds are normal.      Palpations: Abdomen is soft.     Tenderness: There is no abdominal tenderness. There is no right CVA tenderness or left CVA tenderness.  Musculoskeletal:        General: Normal range of motion.     Cervical back: Normal range of motion.     Right lower leg: No edema.     Left lower leg: No edema.  Skin:    General: Skin is warm and dry.  Neurological:     General: No focal deficit present.     Mental Status: She is alert and oriented to person, place, and time.  Psychiatric:        Mood and Affect: Mood normal.        Behavior: Behavior normal.      Results for orders placed or performed in visit on 03/08/23  Hemoglobin A1c  Result Value Ref Range   Hgb A1c MFr Bld 6.7 (H) 4.8 - 5.6 %   Est. average glucose Bld gHb Est-mCnc 146 mg/dL  Lipid Panel w/o Chol/HDL Ratio  Result Value Ref Range   Cholesterol, Total 146 100 - 199 mg/dL   Triglycerides 63 0 - 149 mg/dL   HDL 48 >40 mg/dL   VLDL Cholesterol Cal 13 5 - 40 mg/dL   LDL Chol Calc (NIH) 85 0 - 99 mg/dL  POCT Glucose (CBG)  Result Value Ref Range   POC Glucose 100 (A) 70 - 99 mg/dl  POC CREATINE & ALBUMIN,URINE  Result Value Ref Range   Microalbumin Ur, POC 150 mg/L   Creatinine, POC 200 mg/dL   Albumin/Creatinine Ratio, Urine, POC 30-300     Recent Results (from the past 2160 hour(s))  Iron, TIBC and Ferritin Panel     Status: Abnormal   Collection Time: 01/28/23  3:21 PM  Result Value Ref Range   Total Iron Binding Capacity 294 250 - 450 ug/dL   UIBC 981 191 - 478 ug/dL   Iron 295 (H) 27 - 621 ug/dL   Iron Saturation 50 15 - 55 %   Ferritin 184 (H) 15 - 150 ng/mL  B12 and Folate Panel     Status: None   Collection Time: 01/28/23  3:21 PM  Result Value Ref Range   Vitamin B-12 715 232 - 1,245 pg/mL   Folate >20.0 >3.0 ng/mL    Comment: A serum folate concentration of less than 3.1 ng/mL is considered to represent clinical deficiency.   CBC with Differential/Platelet     Status: Abnormal   Collection Time:  01/28/23  3:21 PM  Result Value Ref Range   WBC 5.3 3.4 - 10.8 x10E3/uL   RBC 5.24 3.77 - 5.28 x10E6/uL   Hemoglobin 12.5 11.1 - 15.9 g/dL   Hematocrit 30.8 65.7 - 46.6 %   MCV 80 79 - 97 fL   MCH 23.9 (L) 26.6 - 33.0 pg   MCHC 29.9 (L) 31.5 - 35.7 g/dL   RDW 84.6 (H) 96.2 - 95.2 %   Platelets 225 150 - 450 x10E3/uL   Neutrophils 31 Not Estab. %   Lymphs 58 Not Estab. %   Monocytes 7 Not Estab. %   Eos 3 Not Estab. %   Basos 1 Not Estab. %   Neutrophils Absolute 1.7 1.4 - 7.0  x10E3/uL   Lymphocytes Absolute 3.1 0.7 - 3.1 x10E3/uL   Monocytes Absolute 0.4 0.1 - 0.9 x10E3/uL   EOS (ABSOLUTE) 0.2 0.0 - 0.4 x10E3/uL   Basophils Absolute 0.0 0.0 - 0.2 x10E3/uL   Immature Granulocytes 0 Not Estab. %   Immature Grans (Abs) 0.0 0.0 - 0.1 x10E3/uL  Comprehensive Metabolic Panel (CMET)     Status: Abnormal   Collection Time: 01/28/23  3:21 PM  Result Value Ref Range   Glucose 95 70 - 99 mg/dL   BUN 19 8 - 27 mg/dL   Creatinine, Ser 1.61 0.57 - 1.00 mg/dL   eGFR 58 (L) >09 UE/AVW/0.98   BUN/Creatinine Ratio 19 12 - 28   Sodium 147 (H) 134 - 144 mmol/L   Potassium 4.0 3.5 - 5.2 mmol/L   Chloride 108 (H) 96 - 106 mmol/L   CO2 24 20 - 29 mmol/L   Calcium 9.8 8.7 - 10.3 mg/dL   Total Protein 7.3 6.0 - 8.5 g/dL   Albumin 4.6 3.8 - 4.8 g/dL   Globulin, Total 2.7 1.5 - 4.5 g/dL   Bilirubin Total 0.4 0.0 - 1.2 mg/dL   Alkaline Phosphatase 190 (H) 44 - 121 IU/L   AST 39 0 - 40 IU/L   ALT 35 (H) 0 - 32 IU/L  POCT CBG (Fasting - Glucose)     Status: Abnormal   Collection Time: 02/04/23 11:20 AM  Result Value Ref Range   Glucose Fasting, POC 129 (A) 70 - 99 mg/dL  Sickle cell screen     Status: None   Collection Time: 02/04/23 11:53 AM  Result Value Ref Range   Sickle Cell Screen Negative Negative    Comment: Since a variety of conditions and other abnormal hemoglobins in addition to Hemoglobin S may give false-positive results, positive Hemoglobin Solubility tests should be confirmed  by hemoglobin fractionation testing.   Liver Profile     Status: None   Collection Time: 02/04/23 11:53 AM  Result Value Ref Range   Bilirubin, Direct 0.11 0.00 - 0.40 mg/dL  Sickle Cell Panel     Status: Abnormal   Collection Time: 02/04/23 11:53 AM  Result Value Ref Range   Glucose 106 (H) 70 - 99 mg/dL   BUN 21 8 - 27 mg/dL   Creatinine, Ser 1.19 (H) 0.57 - 1.00 mg/dL   eGFR 56 (L) >14 NW/GNF/6.21   BUN/Creatinine Ratio 21 12 - 28   Sodium 145 (H) 134 - 144 mmol/L   Potassium 3.8 3.5 - 5.2 mmol/L   Chloride 106 96 - 106 mmol/L   CO2 25 20 - 29 mmol/L   Calcium 9.1 8.7 - 10.3 mg/dL   Total Protein 6.6 6.0 - 8.5 g/dL   Albumin 4.3 3.8 - 4.8 g/dL   Globulin, Total 2.3 1.5 - 4.5 g/dL   Bilirubin Total 0.3 0.0 - 1.2 mg/dL   Alkaline Phosphatase 185 (H) 44 - 121 IU/L   AST 29 0 - 40 IU/L   ALT 28 0 - 32 IU/L   Ferritin 173 (H) 15 - 150 ng/mL   Vit D, 25-Hydroxy 25.3 (L) 30.0 - 100.0 ng/mL    Comment: Vitamin D deficiency has been defined by the Institute of Medicine and an Endocrine Society practice guideline as a level of serum 25-OH vitamin D less than 20 ng/mL (1,2). The Endocrine Society went on to further define vitamin D insufficiency as a level between 21 and 29 ng/mL (2). 1. IOM (Institute of Medicine). 2010. Dietary reference  intakes for calcium and D. [place]: The    [employer]. 2. Holick MF, Binkley Callaway, Bischoff-Ferrari HA, et al.    Evaluation, treatment, and prevention of vitamin D    deficiency: an Endocrine Society clinical practice    guideline. JCEM. 2011 Jul; 96(7):1911-30.    WBC 4.6 3.4 - 10.8 x10E3/uL   RBC 4.83 3.77 - 5.28 x10E6/uL   Hemoglobin 11.9 11.1 - 15.9 g/dL   Hematocrit 41.3 24.4 - 46.6 %   MCV 81 79 - 97 fL   MCH 24.6 (L) 26.6 - 33.0 pg   MCHC 30.5 (L) 31.5 - 35.7 g/dL   RDW 01.0 (H) 27.2 - 53.6 %   Platelets 243 150 - 450 x10E3/uL   Neutrophils 35 Not Estab. %   Lymphs 56 Not Estab. %   Monocytes 6 Not Estab. %    Eos 3 Not Estab. %   Basos 0 Not Estab. %   Neutrophils Absolute 1.6 1.4 - 7.0 x10E3/uL   Lymphocytes Absolute 2.6 0.7 - 3.1 x10E3/uL   Monocytes Absolute 0.3 0.1 - 0.9 x10E3/uL   EOS (ABSOLUTE) 0.1 0.0 - 0.4 x10E3/uL   Basophils Absolute 0.0 0.0 - 0.2 x10E3/uL   Immature Granulocytes 0 Not Estab. %   Immature Grans (Abs) 0.0 0.0 - 0.1 x10E3/uL   Retic Ct Pct 1.4 0.6 - 2.6 %  POCT Glucose (CBG)     Status: Abnormal   Collection Time: 03/08/23 11:09 AM  Result Value Ref Range   POC Glucose 100 (A) 70 - 99 mg/dl  POC CREATINE & ALBUMIN,URINE     Status: Abnormal   Collection Time: 03/08/23 11:50 AM  Result Value Ref Range   Microalbumin Ur, POC 150 mg/L   Creatinine, POC 200 mg/dL   Albumin/Creatinine Ratio, Urine, POC 30-300   Hemoglobin A1c     Status: Abnormal   Collection Time: 03/08/23 11:57 AM  Result Value Ref Range   Hgb A1c MFr Bld 6.7 (H) 4.8 - 5.6 %    Comment:          Prediabetes: 5.7 - 6.4          Diabetes: >6.4          Glycemic control for adults with diabetes: <7.0    Est. average glucose Bld gHb Est-mCnc 146 mg/dL  Lipid Panel w/o Chol/HDL Ratio     Status: None   Collection Time: 03/08/23 11:57 AM  Result Value Ref Range   Cholesterol, Total 146 100 - 199 mg/dL   Triglycerides 63 0 - 149 mg/dL   HDL 48 >64 mg/dL   VLDL Cholesterol Cal 13 5 - 40 mg/dL   LDL Chol Calc (NIH) 85 0 - 99 mg/dL      Assessment & Plan:  Continue all current medications. Problem List Items Addressed This Visit     Hypertension associated with diabetes (HCC) - Primary   Hypothyroidism, postsurgical   Hyperlipidemia due to type 2 diabetes mellitus (HCC)   Type 2 diabetes mellitus without complications (HCC)   Relevant Orders   POCT Glucose (CBG) (Completed)   Hemoglobin A1c (Completed)   POC CREATINE & ALBUMIN,URINE (Completed)   Mixed hyperlipidemia   Relevant Orders   Lipid Panel w/o Chol/HDL Ratio (Completed)   POC CREATINE & ALBUMIN,URINE (Completed)   Iron  deficiency anemia due to chronic blood loss    Return in about 3 months (around 06/06/2023).   Total time spent: 30 minutes  [name], MD  03/08/2023  This document may have been prepared by [employer] Voice Recognition software and as such may include unintentional dictation errors.

## 2023-03-10 ENCOUNTER — Ambulatory Visit: Payer: 59 | Admitting: Orthopedic Surgery

## 2023-03-17 ENCOUNTER — Encounter: Payer: Self-pay | Admitting: Internal Medicine

## 2023-03-17 ENCOUNTER — Other Ambulatory Visit: Payer: Self-pay

## 2023-03-17 DIAGNOSIS — D509 Iron deficiency anemia, unspecified: Secondary | ICD-10-CM

## 2023-03-17 DIAGNOSIS — R195 Other fecal abnormalities: Secondary | ICD-10-CM

## 2023-03-17 MED ORDER — NA SULFATE-K SULFATE-MG SULF 17.5-3.13-1.6 GM/177ML PO SOLN: 354.0000 mL | Freq: Once | ORAL | 0 refills | Status: DC

## 2023-03-17 MED ORDER — CLENPIQ 10-3.5-12 MG-GM -GM/160ML PO SOLN: ORAL | 0 refills | Status: DC

## 2023-03-17 NOTE — Progress Notes (Signed)
Patient received cardiac clearance from Dr. Welton Flakes and procedure was scheduled.

## 2023-03-23 ENCOUNTER — Other Ambulatory Visit: Payer: Self-pay

## 2023-03-25 ENCOUNTER — Other Ambulatory Visit: Payer: Self-pay | Admitting: Internal Medicine

## 2023-03-29 ENCOUNTER — Other Ambulatory Visit: Payer: Self-pay

## 2023-03-29 ENCOUNTER — Telehealth: Payer: Self-pay

## 2023-03-29 DIAGNOSIS — R748 Abnormal levels of other serum enzymes: Secondary | ICD-10-CM

## 2023-03-29 NOTE — Telephone Encounter (Signed)
Good morning. No she does not. I will reschedule her to a later date after she has her procedure. You will see it in her MyChart appointments. Happy New Year!

## 2023-03-29 NOTE — Telephone Encounter (Signed)
Called patient's daughter Keefauver and was not able to leave her a voicemail since it's not set-up. Therefore, I sent patient a MyChart message to let them know that her alkaline phosphatase level was elevated and that's why Dr. Tobi Bastos wanted her to have additional labs drawn. These labs were ordered.

## 2023-03-30 ENCOUNTER — Ambulatory Visit: Payer: 59 | Admitting: Physician Assistant

## 2023-04-02 LAB — PTH, INTACT AND CALCIUM
Calcium: 9.4 mg/dL (ref 8.7–10.3)
PTH: 78 pg/mL — ABNORMAL HIGH (ref 15–65)

## 2023-04-02 LAB — GAMMA GT: GGT: 31 [IU]/L (ref 0–60)

## 2023-04-02 LAB — VITAMIN D 25 HYDROXY (VIT D DEFICIENCY, FRACTURES): Vit D, 25-Hydroxy: 33.5 ng/mL (ref 30.0–100.0)

## 2023-04-02 NOTE — Progress Notes (Signed)
 Referring Physician:  No referring provider defined for this encounter.  Primary Physician:  Helen Fredy RAMAN, MD  History of Present Illness: Helen Mccormick is a 83 y.o. female has a history of HTN, nonischemic cardiomyopathy, OSA, asthma, gastritis, DM, hyperlipidemia, hypothyroidism, and macular degeneration both eyes.    She had T12 kyphoplasty by IR on 10/20/22. This helped her mid back pain. History of cervical fusion years ago. History of lumbar surgery years ago.   Phone visit done on 01/18/23. She is a poor historian.   She has moderate central stenosis C2-C3 with right foraminal stenosis, fusion C3-C4, and right foraminal stenosis C4-C5 with question of myelomalacia on left and mild central stenosis.   She has known lumbar spondylosis with slip L3-L4 and L4-L5 along with facet hypertrophy and bilateral foraminal stenosis L3-S1. LBP is likely from underlying spondylosis.    Cervical MRI reviewed with Dr. Clois regarding question of myelomalacia at C4-C5. Would follow for now unless she has worsening of myelopathic symptoms.    May consider EMG at some point if she continues with numbness in left hand.   She was sent back to Dr. Dodson to discuss possible lumbar injections. She had bilateral L5-S1 TF ESI on 02/05/23 with great improvement. Advised to call back for right C5-C6 TF ESI when needed.   She is here for follow up.   She has intermittent neck pain with bilateral shoulder pain into both arms into her hands. She has intermittent numbness and tingling in both hands. She has weakness in both hands. She thinks her balance has gotten worse. Has trouble with buttons, can put in her earrings.   She had good relief with lumbar ESI in November but is still having intermittent  LBP with right lateral leg pain to her ankle. Pain is worse with standing and walking. She has pain when she gets up from sitting. She has numbness and tingling in her legs. Occasional weakness in her  legs.    No bowel or bladder issues except for urinary urgency.   History of cervical fusion 4 years ago- she got better after the surgery.    Conservative measures:  Physical therapy:  denies Multimodal medical therapy including regular antiinflammatories: oxycodone , flexeril , mobic   Injections: -s/p bilateral L5-S1 TFESI 02/05/2023 with 90% improvement  -s/p right C5-6 TFESI 05/29/2022 with 70% relief -s/p left L4-5 and L5-S1 TFESI 03/16/2022 with 100 % improvement   Past Surgery:  T12 kyphoplasty by IR on 10/20/22 Cervical fusion 4 years ago.   Helen Mccormick has no symptoms of cervical myelopathy.  The symptoms are causing a significant impact on the patient's life.   Review of Systems:  A 10 point review of systems is negative, except for the pertinent positives and negatives detailed in the HPI.  Past Medical History: Past Medical History:  Diagnosis Date   Allergy    Anxiety    CHF (congestive heart failure) (HCC)    Colon polyp    Depression    GERD (gastroesophageal reflux disease)    Heart murmur    Hyperlipidemia    Hypertension    Hypothyroidism    Sleep apnea    Thyroid  disease     Past Surgical History: Past Surgical History:  Procedure Laterality Date   ABDOMINAL HYSTERECTOMY     BACK SURGERY     BACK SURGERY     CERVICAL FUSION     COLONOSCOPY WITH PROPOFOL  N/A 05/28/2017   Procedure: COLONOSCOPY WITH PROPOFOL ;  Surgeon: Helen,  Ruel, MD;  Location: [facility] ENDOSCOPY;  Service: Gastroenterology;  Laterality: N/A;   COLONOSCOPY WITH PROPOFOL  N/A 11/17/2019   Procedure: COLONOSCOPY WITH PROPOFOL ;  Surgeon: Helen Ole DASEN, MD;  Location: [facility] ENDOSCOPY;  Service: Endoscopy;  Laterality: N/A;   ESOPHAGOGASTRODUODENOSCOPY (EGD) WITH PROPOFOL  N/A 11/17/2019   Procedure: ESOPHAGOGASTRODUODENOSCOPY (EGD) WITH PROPOFOL ;  Surgeon: Helen Ole DASEN, MD;  Location: [facility] ENDOSCOPY;  Service: Endoscopy;  Laterality: N/A;   IR KYPHO THORACIC WITH BONE  BIOPSY  10/20/2022   IR RADIOLOGIST EVAL & MGMT  09/22/2022   NECK SURGERY     THYROID  SURGERY     THYROID  SURGERY      Allergies: Allergies as of 04/08/2023 - Review Complete 03/09/2023  Allergen Reaction Noted   Amlodipine  Hives and Swelling 12/24/2014   Tramadol  Other (See Comments) 06/14/2019   Isosorbide  dinitrate  07/06/2018   Codeine Nausea Only 09/11/2020   Etodolac Other (See Comments) 04/22/2016    Medications: Outpatient Encounter Medications as of 04/08/2023  Medication Sig   Accu-Chek Softclix Lancets lancets Use as instructed   acetaminophen  (TYLENOL ) 650 MG CR tablet Take 650 mg by mouth every 8 (eight) hours as needed for pain.   albuterol  (VENTOLIN  HFA) 108 (90 Base) MCG/ACT inhaler Inhale 2 puffs by mouth every 6 hours as needed for wheezing/shortness of breath   ARNUITY ELLIPTA  100 MCG/ACT AEPB Inhale 1 puff by mouth every day   aspirin  81 MG chewable tablet Chew 81 mg by mouth daily. (Patient not taking: Reported on 03/08/2023)   Azelastine HCl 137 MCG/SPRAY SOLN Use 1 spray into each nostril every day as directed   Blood Glucose Monitoring Suppl (ACCU-CHEK GUIDE) w/Device KIT See admin instructions.   busPIRone (BUSPAR) 15 MG tablet Take 15 mg by mouth 2 (two) times daily.   carvedilol  (COREG ) 12.5 MG tablet Take 12.5 mg by mouth 2 (two) times daily.   Cholecalciferol  (VITAMIN D -3) 25 MCG (1000 UT) CAPS Take by mouth.   clotrimazole -betamethasone  (LOTRISONE ) cream Apply 1 Application topically 2 (two) times daily.   empagliflozin  (JARDIANCE ) 25 MG TABS tablet Take 1 tablet (25 mg total) by mouth daily.   escitalopram  (LEXAPRO ) 10 MG tablet Take 1 tablet by mouth daily   FARXIGA 10 MG TABS tablet Take 10 mg by mouth daily.   fluticasone  (FLOVENT  HFA) 110 MCG/ACT inhaler Inhale 1 puff into the lungs 2 (two) times daily.   furosemide  (LASIX ) 20 MG tablet TAKE 1 TABLET BY MOUTH MONDAY WEDNESDAY FRIDAY   gabapentin  (NEURONTIN ) 100 MG capsule Take 100 mg by mouth 3  (three) times daily.   ibuprofen  (ADVIL ) 200 MG tablet Take 200 mg by mouth every 6 (six) hours as needed. (Patient not taking: Reported on 02/04/2023)   isosorbide  dinitrate (ISORDIL ) 30 MG tablet Take 1 tablet by mouth daily   levothyroxine  (SYNTHROID ) 75 MCG tablet Take 1 tablet (75 mcg total) by mouth daily.   meclizine  (ANTIVERT ) 12.5 MG tablet Take 1 tablet (12.5 mg total) by mouth 3 (three) times daily as needed for dizziness.   nitroGLYCERIN  (NITROSTAT ) 0.4 MG SL tablet Place 1 tablet (0.4 mg total) under the tongue every 5 (five) minutes as needed for chest pain.   Omega-3 Fatty Acids (FISH OIL) 1000 MG CAPS Take by mouth. (Patient not taking: Reported on 02/04/2023)   ONETOUCH VERIO test strip USE TO CHECK BLOOD SUGAR TWICE DAILY   oxyCODONE  (OXY IR/ROXICODONE ) 5 MG immediate release tablet Take 1 tablet (5 mg total) by mouth every 8 (eight) hours as needed for  severe pain (pain score 7-10).   pantoprazole  (PROTONIX ) 40 MG tablet Take one tablet by mouth daily   polyethylene glycol powder (GLYCOLAX /MIRALAX ) 17 GM/SCOOP powder    RESTASIS  0.05 % ophthalmic emulsion Place 1 drop into both eyes 2 (two) times daily.   rosuvastatin  (CRESTOR ) 40 MG tablet Take 40 mg by mouth daily.   sacubitril -valsartan  (ENTRESTO ) 97-103 MG Take 1 tablet by mouth 2 (two) times daily.   Sod Picosulfate-Mag Ox-Cit Acd (CLENPIQ ) 10-3.5-12 MG-GM -GM/160ML SOLN Take 1 bottle at 5 PM followed by five 8 oz cups of water and repeat 5 hours before procedure.   No facility-administered encounter medications on file as of 04/08/2023.    Social History: Social History   Tobacco Use   Smoking status: Never   Smokeless tobacco: Former    Types: Associate Professor status: Never Used  Substance Use Topics   Alcohol use: No   Drug use: No    Family Medical History: Family History  Problem Relation Age of Onset   Breast cancer Maternal Aunt 82   Diabetes Mother    Leukemia Mother    Heart Problems Mother     Diabetes Sister    Mental illness Sister    Colon polyps Sister    Stroke Father    Cancer Brother        rectal   Healthy Daughter    Hypertension Son    Diabetes Sister    Thyroid  disease Sister    Healthy Sister    Healthy Sister    Stroke Brother    Healthy Son    Healthy Son    Healthy Daughter     Physical Examination: There were no vitals filed for this visit.  Awake, alert, oriented to person, place, and time.  Speech is clear and fluent. Fund of knowledge is appropriate.   Cranial Nerves: Pupils equal round and reactive to light.  Facial tone is symmetric.    Mild lower posterior cervical tenderness and mild right trapezial tenderness.   Well healed cervical incision.   No abnormal lesions on exposed skin.   Strength: Side Biceps Triceps Deltoid Interossei Grip Wrist Ext. Wrist Flex.  R 5 5 5 5 5 5 5   L 5 5 5 5 5 5 5    Side Iliopsoas Quads Hamstring PF DF EHL  R 5 5 5 5 5 5   L 5 5 5 5 5 5    Reflexes are 2+ and symmetric at the biceps, brachioradialis, patella and achilles.   Hoffman's is absent bilaterally. Clonus is not present.   Bilateral upper and lower extremity sensation is intact to light touch.     No pain with IR/ER of both hips.   She has mild pain with limited ROM of right shoulder. Reasonable ROM of left shoulder with no pain.   Gait is unsteady.    Medical Decision Making  Imaging: Cervical xrays dated 02/02/23:  FINDINGS: Cervical spine is visualized to the level of T1. Stable C3-4 ACDF with osseous fusion across the disc space. Hardware is intact with stable alignment. No acute fracture. Similar degenerative changes with disc height loss and endplate osteophytosis of the lower cervical spine, most pronounced at C4-C5. Prevertebral soft tissues are unremarkable.   IMPRESSION: 1. No acute fracture of the cervical spine. 2. Stable C3-4 ACDF with intact hardware. Similar degenerative changes of the lower cervical spine, most  pronounced at C4-C5.     Electronically Signed   By: Harrietta  Lateef M.D.   On: 02/02/2023 10:06    I have personally reviewed the images and agree with the above interpretation.    Assessment and Plan: Ms. Schaff is a pleasant 83 y.o. female who had T12 kyphoplasty by IR on 10/20/22.  History of lumbar surgery years ago. History of cervical fusion 4 years ago- she got better after the surgery.   She has intermittent neck pain with bilateral shoulder pain into both arms into her hands. She has intermittent numbness and tingling in both hands. She has weakness in both hands.   She has moderate central stenosis C2-C3 with right foraminal stenosis, fusion C3-C4, and right foraminal stenosis C4-C5 with question of myelomalacia on left and mild central stenosis.   Cervical MRI reviewed previously with Dr. Clois regarding question of myelomalacia at C4-C5. He wanted follow for now unless she has worsening of myelopathic symptoms.   She thinks her balance has gotten worse. Has trouble with buttons, can still put in her earrings.   She has limited painful ROM of her right shoulder that appears shoulder mediated.   She had good relief with lumbar ESI in November but is still having intermittent  LBP with right lateral leg pain to her ankle. Pain is worse with standing and walking. She has pain when she gets up from sitting. She has numbness and tingling in her legs. Occasional weakness in her legs.   She has known lumbar spondylosis with slip L3-L4 and L4-L5 along with facet hypertrophy and bilateral foraminal stenosis L3-S1. LBP is likely from underlying spondylosis.   Treatment options discussed with patient and following plan made:   - Balance and dexterity are worse. She has unsteady gait. Will have her follow up with Dr. Clois to discuss possible myelomalacia C4-C5.  - Referral to ortho for evaluation of right shoulder ([facility] here in [place]).  - Consider repeat lumbar ESI  if these symptoms get worse. Can also repeat cervical ESI as well depending on her follow up with Dr. Clois.   I spent a total of 20 minutes in face-to-face and non-face-to-face activities related to this patient's care today including review of outside records, review of imaging, review of symptoms, physical exam, discussion of differential diagnosis, discussion of treatment options, and documentation.   Glade Boys PA-C Dept. of Neurosurgery

## 2023-04-08 ENCOUNTER — Encounter: Payer: Self-pay | Admitting: Orthopedic Surgery

## 2023-04-08 ENCOUNTER — Ambulatory Visit (INDEPENDENT_AMBULATORY_CARE_PROVIDER_SITE_OTHER): Payer: Medicare HMO | Admitting: Orthopedic Surgery

## 2023-04-08 VITALS — BP 132/78 | Ht 64.0 in | Wt 158.0 lb

## 2023-04-08 DIAGNOSIS — M5412 Radiculopathy, cervical region: Secondary | ICD-10-CM

## 2023-04-08 DIAGNOSIS — M48061 Spinal stenosis, lumbar region without neurogenic claudication: Secondary | ICD-10-CM

## 2023-04-08 DIAGNOSIS — M25511 Pain in right shoulder: Secondary | ICD-10-CM

## 2023-04-08 DIAGNOSIS — M4316 Spondylolisthesis, lumbar region: Secondary | ICD-10-CM

## 2023-04-08 DIAGNOSIS — M47816 Spondylosis without myelopathy or radiculopathy, lumbar region: Secondary | ICD-10-CM

## 2023-04-08 DIAGNOSIS — M4726 Other spondylosis with radiculopathy, lumbar region: Secondary | ICD-10-CM | POA: Diagnosis not present

## 2023-04-08 DIAGNOSIS — M5416 Radiculopathy, lumbar region: Secondary | ICD-10-CM

## 2023-04-08 DIAGNOSIS — Z981 Arthrodesis status: Secondary | ICD-10-CM | POA: Diagnosis not present

## 2023-04-08 DIAGNOSIS — M4802 Spinal stenosis, cervical region: Secondary | ICD-10-CM | POA: Diagnosis not present

## 2023-04-08 DIAGNOSIS — Z9889 Other specified postprocedural states: Secondary | ICD-10-CM

## 2023-04-08 NOTE — Patient Instructions (Signed)
 It was so nice to see you today. Thank you so much for coming in.    I want you to follow up with Dr. Clois for your neck. I'm worried that your balance is getting worse and that you are having more trouble using your hands.   I want you to see ortho at our office (Dr. Genelle) for evaluation of your right shoulder. They should call you to schedule an appointment or you can call them at number below.    Please do not hesitate to call if you have any questions or concerns. You can also message me in MyChart.   Glade Boys PA-C [phone]     The physicians and staff at [facility] at [facility] are committed to providing excellent care. You may receive a survey asking for feedback about your experience at our office. We value you your feedback and appreciate you taking the time to to fill it out. The [facility] leadership team is also available to discuss your experience in person, feel free to contact us  [phone].

## 2023-04-09 ENCOUNTER — Other Ambulatory Visit: Payer: Self-pay | Admitting: Internal Medicine

## 2023-04-09 DIAGNOSIS — R600 Localized edema: Secondary | ICD-10-CM

## 2023-04-09 MED ORDER — FUROSEMIDE 20 MG PO TABS: ORAL_TABLET | ORAL | 3 refills | Status: DC

## 2023-04-15 ENCOUNTER — Other Ambulatory Visit: Payer: Self-pay | Admitting: Internal Medicine

## 2023-04-15 MED ORDER — EMPAGLIFLOZIN 25 MG PO TABS: 25.0000 mg | ORAL_TABLET | Freq: Every day | ORAL | 3 refills | Status: DC

## 2023-04-20 ENCOUNTER — Encounter: Payer: Self-pay | Admitting: Gastroenterology

## 2023-04-20 ENCOUNTER — Ambulatory Visit
Admission: RE | Admit: 2023-04-20 | Discharge: 2023-04-20 | Disposition: A | Discharge status: Home / Self Care | Payer: Medicare HMO | Attending: Gastroenterology | Admitting: Gastroenterology

## 2023-04-20 ENCOUNTER — Ambulatory Visit: Payer: Medicare HMO | Admitting: Anesthesiology

## 2023-04-20 ENCOUNTER — Other Ambulatory Visit: Payer: Self-pay

## 2023-04-20 ENCOUNTER — Encounter
Admission: RE | Disposition: A | Discharge status: Home / Self Care | Payer: Self-pay | Source: Home / Self Care | Attending: Gastroenterology

## 2023-04-20 DIAGNOSIS — K5790 Diverticulosis of intestine, part unspecified, without perforation or abscess without bleeding: Secondary | ICD-10-CM | POA: Diagnosis not present

## 2023-04-20 DIAGNOSIS — D509 Iron deficiency anemia, unspecified: Secondary | ICD-10-CM

## 2023-04-20 DIAGNOSIS — F419 Anxiety disorder, unspecified: Secondary | ICD-10-CM | POA: Diagnosis not present

## 2023-04-20 DIAGNOSIS — G709 Myoneural disorder, unspecified: Secondary | ICD-10-CM | POA: Insufficient documentation

## 2023-04-20 DIAGNOSIS — J45909 Unspecified asthma, uncomplicated: Secondary | ICD-10-CM | POA: Diagnosis not present

## 2023-04-20 DIAGNOSIS — K921 Melena: Secondary | ICD-10-CM | POA: Insufficient documentation

## 2023-04-20 DIAGNOSIS — G4730 Sleep apnea, unspecified: Secondary | ICD-10-CM | POA: Insufficient documentation

## 2023-04-20 DIAGNOSIS — Z833 Family history of diabetes mellitus: Secondary | ICD-10-CM | POA: Insufficient documentation

## 2023-04-20 DIAGNOSIS — K5730 Diverticulosis of large intestine without perforation or abscess without bleeding: Secondary | ICD-10-CM | POA: Diagnosis not present

## 2023-04-20 DIAGNOSIS — I25119 Atherosclerotic heart disease of native coronary artery with unspecified angina pectoris: Secondary | ICD-10-CM | POA: Insufficient documentation

## 2023-04-20 DIAGNOSIS — Z8249 Family history of ischemic heart disease and other diseases of the circulatory system: Secondary | ICD-10-CM | POA: Insufficient documentation

## 2023-04-20 DIAGNOSIS — I358 Other nonrheumatic aortic valve disorders: Secondary | ICD-10-CM | POA: Diagnosis not present

## 2023-04-20 DIAGNOSIS — D123 Benign neoplasm of transverse colon: Secondary | ICD-10-CM | POA: Diagnosis not present

## 2023-04-20 DIAGNOSIS — D126 Benign neoplasm of colon, unspecified: Secondary | ICD-10-CM

## 2023-04-20 DIAGNOSIS — Z8349 Family history of other endocrine, nutritional and metabolic diseases: Secondary | ICD-10-CM | POA: Diagnosis not present

## 2023-04-20 DIAGNOSIS — E039 Hypothyroidism, unspecified: Secondary | ICD-10-CM | POA: Insufficient documentation

## 2023-04-20 DIAGNOSIS — I25118 Atherosclerotic heart disease of native coronary artery with other forms of angina pectoris: Secondary | ICD-10-CM | POA: Diagnosis not present

## 2023-04-20 DIAGNOSIS — Z87891 Personal history of nicotine dependence: Secondary | ICD-10-CM | POA: Diagnosis not present

## 2023-04-20 DIAGNOSIS — K635 Polyp of colon: Secondary | ICD-10-CM | POA: Diagnosis not present

## 2023-04-20 DIAGNOSIS — K219 Gastro-esophageal reflux disease without esophagitis: Secondary | ICD-10-CM | POA: Insufficient documentation

## 2023-04-20 DIAGNOSIS — E119 Type 2 diabetes mellitus without complications: Secondary | ICD-10-CM | POA: Diagnosis not present

## 2023-04-20 DIAGNOSIS — I110 Hypertensive heart disease with heart failure: Secondary | ICD-10-CM | POA: Diagnosis not present

## 2023-04-20 DIAGNOSIS — I509 Heart failure, unspecified: Secondary | ICD-10-CM | POA: Insufficient documentation

## 2023-04-20 DIAGNOSIS — Z7984 Long term (current) use of oral hypoglycemic drugs: Secondary | ICD-10-CM | POA: Insufficient documentation

## 2023-04-20 DIAGNOSIS — D500 Iron deficiency anemia secondary to blood loss (chronic): Secondary | ICD-10-CM | POA: Diagnosis not present

## 2023-04-20 DIAGNOSIS — R195 Other fecal abnormalities: Secondary | ICD-10-CM

## 2023-04-20 HISTORY — PX: COLONOSCOPY WITH PROPOFOL: SHX5780

## 2023-04-20 HISTORY — PX: ESOPHAGOGASTRODUODENOSCOPY (EGD) WITH PROPOFOL: SHX5813

## 2023-04-20 HISTORY — PX: POLYPECTOMY: SHX5525

## 2023-04-20 LAB — GLUCOSE, CAPILLARY: Glucose-Capillary: 95 mg/dL (ref 70–99)

## 2023-04-20 SURGERY — ESOPHAGOGASTRODUODENOSCOPY (EGD) WITH PROPOFOL
Anesthesia: General

## 2023-04-20 MED ORDER — LIDOCAINE HCL (CARDIAC) PF 100 MG/5ML IV SOSY
PREFILLED_SYRINGE | INTRAVENOUS | Status: DC | PRN
  Administered 2023-04-20: 100 mg via INTRAVENOUS

## 2023-04-20 MED ORDER — SODIUM CHLORIDE 0.9 % IV SOLN: INTRAVENOUS | Status: DC

## 2023-04-20 MED ORDER — PROPOFOL 500 MG/50ML IV EMUL
INTRAVENOUS | Status: DC | PRN
  Administered 2023-04-20: 150 ug/kg/min via INTRAVENOUS

## 2023-04-20 MED ORDER — PROPOFOL 10 MG/ML IV BOLUS
INTRAVENOUS | Status: DC | PRN
  Administered 2023-04-20: 40 mg via INTRAVENOUS
  Administered 2023-04-20: 20 mg via INTRAVENOUS

## 2023-04-20 NOTE — Op Note (Signed)
[facility] Gastroenterology Patient Name: Helen Mccormick Procedure Date: 04/20/2023 9:26 AM MRN: [id number] Account #: [id number] Date of Birth: 07/27/1940 Admit Type: Outpatient Age: 83 Room: [facility] ENDO ROOM 2 Gender: Female Note Status: Finalized Instrument Name: Nelda Marseille [id number] Procedure:             Colonoscopy Indications:           Melena Providers:             Wyline Mood MD, MD Medicines:             Monitored Anesthesia Care Complications:         No immediate complications. Procedure:             Pre-Anesthesia Assessment:                        - Prior to the procedure, a History and Physical was                         performed, and patient medications, allergies and                         sensitivities were reviewed. The patient's tolerance                         of previous anesthesia was reviewed.                        - The risks and benefits of the procedure and the                         sedation options and risks were discussed with the                         patient. All questions were answered and informed                         consent was obtained.                        - ASA Grade Assessment: II - A patient with mild                         systemic disease.                        After obtaining informed consent, the colonoscope was                         passed under direct vision. Throughout the procedure,                         the patient's blood pressure, pulse, and oxygen                         saturations were monitored continuously. The                         Colonoscope was introduced through the anus and  advanced to the the cecum, identified by the                         appendiceal orifice. The colonoscopy was performed                         with ease. The patient tolerated the procedure well.                         The quality of the bowel preparation was good.                          Anatomical landmarks were photographed. Findings:      The perianal and digital rectal examinations were normal.      A 5 mm polyp was found in the transverse colon. The polyp was sessile.       The polyp was removed with a cold snare. Resection and retrieval were       complete.      Multiple small-mouthed diverticula were found in the sigmoid colon.      The exam was otherwise without abnormality on direct and retroflexion       views. Impression:            - One 5 mm polyp in the transverse colon, removed with                         a cold snare. Resected and retrieved.                        - Diverticulosis in the sigmoid colon.                        - The examination was otherwise normal on direct and                         retroflexion views. Recommendation:        - Discharge patient to home (with escort).                        - Resume previous diet.                        - Continue present medications.                        - Await pathology results.                        - Repeat colonoscopy is not recommended due to current                         age (56 years or older) for surveillance. Procedure Code(s):     --- Professional ---                        [phone], Colonoscopy, flexible; with removal of                         tumor(s), polyp(s), or other lesion(s) by snare  technique Diagnosis Code(s):     --- Professional ---                        D12.3, Benign neoplasm of transverse colon (hepatic                         flexure or splenic flexure)                        K57.30, Diverticulosis of large intestine without                         perforation or abscess without bleeding                        K92.1, Melena (includes Hematochezia) CPT copyright 2022 American Medical Association. All rights reserved. The codes documented in this report are preliminary and upon coder review may  be revised to meet current compliance  requirements. Wyline Mood, MD Wyline Mood MD, MD 04/20/2023 10:09:35 AM This report has been signed electronically. Number of Addenda: 0 Note Initiated On: 04/20/2023 9:26 AM Scope Withdrawal Time: 0 hours 8 minutes 16 seconds  Total Procedure Duration: 0 hours 14 minutes 28 seconds  Estimated Blood Loss:  Estimated blood loss: none.      [facility]

## 2023-04-20 NOTE — Anesthesia Procedure Notes (Signed)
Date/Time: 04/20/2023 9:44 AM  Performed by: Malva Cogan, CRNAPre-anesthesia Checklist: Patient identified, Emergency Drugs available, Suction available, Patient being monitored and Timeout performed Patient Re-evaluated:Patient Re-evaluated prior to induction Oxygen Delivery Method: Nasal cannula Induction Type: IV induction Airway Equipment and Method: Bite block Placement Confirmation: CO2 detector and positive ETCO2

## 2023-04-20 NOTE — Transfer of Care (Signed)
Immediate Anesthesia Transfer of Care Note  Patient: Helen Mccormick  Procedure(s) Performed: ESOPHAGOGASTRODUODENOSCOPY (EGD) WITH PROPOFOL COLONOSCOPY WITH PROPOFOL  Patient Location: PACU  Anesthesia Type:General  Level of Consciousness: drowsy  Airway & Oxygen Therapy: Patient Spontanous Breathing  Post-op Assessment: Report given to RN and Post -op Vital signs reviewed and stable  Post vital signs: Reviewed and stable  Last Vitals:  Vitals Value Taken Time  BP    Temp    Pulse    Resp    SpO2      Last Pain:  Vitals:   04/20/23 0921  TempSrc: Temporal  PainSc: 0-No pain         Complications: No notable events documented.

## 2023-04-20 NOTE — Anesthesia Postprocedure Evaluation (Signed)
Anesthesia Post Note  Patient: Helen Mccormick  Procedure(s) Performed: ESOPHAGOGASTRODUODENOSCOPY (EGD) WITH PROPOFOL COLONOSCOPY WITH PROPOFOL POLYPECTOMY  Patient location during evaluation: Endoscopy Anesthesia Type: General Level of consciousness: awake and alert Pain management: pain level controlled Vital Signs Assessment: post-procedure vital signs reviewed and stable Respiratory status: spontaneous breathing, nonlabored ventilation, respiratory function stable and patient connected to nasal cannula oxygen Cardiovascular status: blood pressure returned to baseline and stable Postop Assessment: no apparent nausea or vomiting Anesthetic complications: no   No notable events documented.   Last Vitals:  Vitals:   04/20/23 0921 04/20/23 1011  BP: (!) 182/91   Pulse: 62   Resp: 16   Temp: (!) 36.2 C (!) 36.1 C  SpO2: 100%     Last Pain:  Vitals:   04/20/23 1021  TempSrc:   PainSc: 0-No pain                 Louie Boston

## 2023-04-20 NOTE — Anesthesia Preprocedure Evaluation (Addendum)
Anesthesia Evaluation  Patient identified by MRN, date of birth, ID band Patient awake    Reviewed: Allergy & Precautions, NPO status , Patient's Chart, lab work & pertinent test results  History of Anesthesia Complications Negative for: history of anesthetic complications  Airway Mallampati: III  TM Distance: >3 FB Neck ROM: full    Dental  (+) Poor Dentition   Pulmonary asthma , sleep apnea    Pulmonary exam normal        Cardiovascular hypertension, + angina  + CAD and +CHF  Normal cardiovascular exam+ Valvular Problems/Murmurs   Echo 4/24 IMPRESSIONS     1. Left ventricular ejection fraction, by estimation, is 55 to 60%. The  left ventricle has normal function. Left ventricular endocardial border  not optimally defined to evaluate regional wall motion. There is mild left  ventricular hypertrophy. Left  ventricular diastolic parameters are consistent with Grade I diastolic  dysfunction (impaired relaxation).   2. Right ventricular systolic function is normal. The right ventricular  size is normal.   3. The mitral valve is normal in structure. Mild mitral valve  regurgitation. No evidence of mitral stenosis.   4. The aortic valve is normal in structure. Aortic valve regurgitation is  not visualized. Aortic valve sclerosis is present, with no evidence of  aortic valve stenosis.   5. The inferior vena cava is normal in size with greater than 50%  respiratory variability, suggesting right atrial pressure of 3 mmHg.     Neuro/Psych  PSYCHIATRIC DISORDERS Anxiety Depression     Neuromuscular disease    GI/Hepatic Neg liver ROS,GERD  ,,  Endo/Other  diabetesHypothyroidism    Renal/GU negative Renal ROS  negative genitourinary   Musculoskeletal   Abdominal   Peds  Hematology  (+) Blood dyscrasia, anemia   Anesthesia Other Findings Past Medical History: No date: Allergy No date: Anxiety No date: CHF  (congestive heart failure) (HCC) No date: Colon polyp No date: Depression No date: GERD (gastroesophageal reflux disease) No date: Heart murmur No date: Hyperlipidemia No date: Hypertension No date: Hypothyroidism No date: Sleep apnea No date: Thyroid disease  Past Surgical History: No date: ABDOMINAL HYSTERECTOMY No date: BACK SURGERY No date: BACK SURGERY No date: CERVICAL FUSION 05/28/2017: COLONOSCOPY WITH PROPOFOL; N/A     Comment:  Procedure: COLONOSCOPY WITH PROPOFOL;  Surgeon: Wyline Mood, MD;  Location: [facility] ENDOSCOPY;  Service:               Gastroenterology;  Laterality: N/A; 11/17/2019: COLONOSCOPY WITH PROPOFOL; N/A     Comment:  Procedure: COLONOSCOPY WITH PROPOFOL;  Surgeon:               Regis Bill, MD;  Location: [facility] ENDOSCOPY;                Service: Endoscopy;  Laterality: N/A; 11/17/2019: ESOPHAGOGASTRODUODENOSCOPY (EGD) WITH PROPOFOL; N/A     Comment:  Procedure: ESOPHAGOGASTRODUODENOSCOPY (EGD) WITH               PROPOFOL;  Surgeon: Regis Bill, MD;  Location:               [facility] ENDOSCOPY;  Service: Endoscopy;  Laterality: N/A; 10/20/2022: IR KYPHO THORACIC WITH BONE BIOPSY 09/22/2022: IR RADIOLOGIST EVAL & MGMT No date: NECK SURGERY No date: THYROID SURGERY No date: THYROID SURGERY     Reproductive/Obstetrics negative OB ROS  Anesthesia Physical Anesthesia Plan  ASA: 3  Anesthesia Plan: General   Post-op Pain Management: Minimal or no pain anticipated   Induction: Intravenous  PONV Risk Score and Plan: 2 and Propofol infusion and TIVA  Airway Management Planned: Natural Airway and Nasal Cannula  Additional Equipment:   Intra-op Plan:   Post-operative Plan:   Informed Consent: I have reviewed the patients History and Physical, chart, labs and discussed the procedure including the risks, benefits and alternatives for the proposed anesthesia with the patient  or authorized representative who has indicated his/her understanding and acceptance.     Dental Advisory Given  Plan Discussed with: Anesthesiologist, CRNA and Surgeon  Anesthesia Plan Comments: (Patient consented for risks of anesthesia including but not limited to:  - adverse reactions to medications - risk of airway placement if required - damage to eyes, teeth, lips or other oral mucosa - nerve damage due to positioning  - sore throat or hoarseness - Damage to heart, brain, nerves, lungs, other parts of body or loss of life  Patient voiced understanding and assent.)       Anesthesia Quick Evaluation

## 2023-04-20 NOTE — Op Note (Signed)
[facility] Gastroenterology Patient Name: Helen Mccormick Procedure Date: 04/20/2023 9:26 AM MRN: [id number] Account #: [id number] Date of Birth: [birth year] Admit Type: Outpatient Age: 83 Room: [facility] ENDO ROOM 2 Gender: Female Note Status: Finalized Instrument Name: Patton Salles Endoscope [id number] Procedure:             Upper GI endoscopy Indications:           Melena Providers:             Wyline Mood MD, MD Medicines:             Monitored Anesthesia Care Complications:         No immediate complications. Procedure:             Pre-Anesthesia Assessment:                        - Prior to the procedure, a History and Physical was                         performed, and patient medications, allergies and                         sensitivities were reviewed. The patient's tolerance                         of previous anesthesia was reviewed.                        - The risks and benefits of the procedure and the                         sedation options and risks were discussed with the                         patient. All questions were answered and informed                         consent was obtained.                        - ASA Grade Assessment: II - A patient with mild                         systemic disease.                        After obtaining informed consent, the endoscope was                         passed under direct vision. Throughout the procedure,                         the patient's blood pressure, pulse, and oxygen                         saturations were monitored continuously. The Endoscope                         was introduced through the mouth, and advanced to the  third part of duodenum. The upper GI endoscopy was                         accomplished with ease. The patient tolerated the                         procedure well. Findings:      The esophagus was normal.      The stomach was normal.      The examined duodenum was  normal. Impression:            - Normal esophagus.                        - Normal stomach.                        - Normal examined duodenum.                        - No specimens collected. Recommendation:        - Perform a colonoscopy today. Procedure Code(s):     --- Professional ---                        [phone], Esophagogastroduodenoscopy, flexible,                         transoral; diagnostic, including collection of                         specimen(s) by brushing or washing, when performed                         (separate procedure) Diagnosis Code(s):     --- Professional ---                        K92.1, Melena (includes Hematochezia) CPT copyright 2022 American Medical Association. All rights reserved. The codes documented in this report are preliminary and upon coder review may  be revised to meet current compliance requirements. Wyline Mood, MD Wyline Mood MD, MD 04/20/2023 9:50:44 AM This report has been signed electronically. Number of Addenda: 0 Note Initiated On: 04/20/2023 9:26 AM Estimated Blood Loss:  Estimated blood loss: none.      [facility]

## 2023-04-20 NOTE — H&P (Signed)
Wyline Mood, MD [address], [address], [place], [place], [address] [address], [place], [place], [address] Phone: [phone]  Fax: [phone]  Primary Care Physician:  [name], MD   Pre-Procedure History & Physical: HPI:  Helen Mccormick is a 83 y.o. female is here for an endoscopy and colonoscopy    Past Medical History:  Diagnosis Date   Allergy    Anxiety    CHF (congestive heart failure) (HCC)    Colon polyp    Depression    GERD (gastroesophageal reflux disease)    Heart murmur    Hyperlipidemia    Hypertension    Hypothyroidism    Sleep apnea    Thyroid disease     Past Surgical History:  Procedure Laterality Date   ABDOMINAL HYSTERECTOMY     BACK SURGERY     BACK SURGERY     CERVICAL FUSION     COLONOSCOPY WITH PROPOFOL N/A 05/28/2017   Procedure: COLONOSCOPY WITH PROPOFOL;  Surgeon: Wyline Mood, MD;  Location: [facility] ENDOSCOPY;  Service: Gastroenterology;  Laterality: N/A;   COLONOSCOPY WITH PROPOFOL N/A 11/17/2019   Procedure: COLONOSCOPY WITH PROPOFOL;  Surgeon: Regis Bill, MD;  Location: [facility] ENDOSCOPY;  Service: Endoscopy;  Laterality: N/A;   ESOPHAGOGASTRODUODENOSCOPY (EGD) WITH PROPOFOL N/A 11/17/2019   Procedure: ESOPHAGOGASTRODUODENOSCOPY (EGD) WITH PROPOFOL;  Surgeon: Regis Bill, MD;  Location: [facility] ENDOSCOPY;  Service: Endoscopy;  Laterality: N/A;   IR KYPHO THORACIC WITH BONE BIOPSY  10/20/2022   IR RADIOLOGIST EVAL & MGMT  09/22/2022   NECK SURGERY     THYROID SURGERY     THYROID SURGERY      Prior to Admission medications   Medication Sig Start Date End Date Taking? Authorizing Provider  Accu-Chek Softclix Lancets lancets Use as instructed 02/19/23   [name], MD  albuterol (VENTOLIN HFA) 108 (90 Base) MCG/ACT inhaler Inhale 2 puffs by mouth every 6 hours as needed for wheezing/shortness of breath 03/25/23   [name], MD  ARNUITY ELLIPTA 100 MCG/ACT AEPB Inhale 1 puff by mouth every day  03/25/23   [name], MD  Azelastine HCl 137 MCG/SPRAY SOLN Use 1 spray into each nostril every day as directed 02/10/23 05/11/23  [name], MD  Blood Glucose Monitoring Suppl (ACCU-CHEK GUIDE) w/Device KIT See admin instructions. 11/28/20   [provider]  busPIRone (BUSPAR) 15 MG tablet Take 15 mg by mouth 2 (two) times daily. 01/29/23   [provider]  carvedilol (COREG) 12.5 MG tablet Take 12.5 mg by mouth 2 (two) times daily. 02/21/23   [provider]  Cholecalciferol (VITAMIN D-3) 25 MCG (1000 UT) CAPS Take by mouth.    [provider]  clotrimazole-betamethasone (LOTRISONE) cream Apply 1 Application topically 2 (two) times daily. 12/04/22   [name], MD  empagliflozin (JARDIANCE) 25 MG TABS tablet Take 1 tablet (25 mg total) by mouth daily. 04/15/23   [name], MD  fluticasone (FLOVENT HFA) 110 MCG/ACT inhaler Inhale 1 puff into the lungs 2 (two) times daily.    [provider]  furosemide (LASIX) 20 MG tablet TAKE 1 TABLET BY MOUTH MONDAY [facility] FRIDAY 04/09/23   [name], MD  ibuprofen (ADVIL) 200 MG tablet Take 200 mg by mouth every 6 (six) hours as needed.    [provider]  isosorbide dinitrate (ISORDIL) 30 MG tablet Take 1 tablet by mouth daily 07/14/22   [name],  MD  levothyroxine (SYNTHROID) 75 MCG tablet Take 1 tablet (75 mcg total) by mouth daily. 01/01/23 01/01/24  [name], MD  meclizine (ANTIVERT) 12.5 MG tablet Take 1 tablet (12.5 mg total) by mouth 3 (three) times daily as needed for dizziness. 10/13/22   [name], MD  nitroGLYCERIN (NITROSTAT) 0.4 MG SL tablet Place 1 tablet (0.4 mg total) under the tongue every 5 (five) minutes as needed for chest pain. 08/20/17   Galen Manila, NP  Omega-3 Fatty Acids (FISH OIL) 1000 MG CAPS Take by mouth.    [provider]  [facility] VERIO test strip USE TO CHECK BLOOD SUGAR TWICE DAILY 02/16/23   [name], MD  pantoprazole  (PROTONIX) 40 MG tablet Take one tablet by mouth daily 11/19/22   [name], MD  polyethylene glycol powder (GLYCOLAX/MIRALAX) 17 GM/SCOOP powder     [provider]  rosuvastatin (CRESTOR) 40 MG tablet Take 40 mg by mouth daily.    [provider]  sacubitril-valsartan (ENTRESTO) 97-103 MG Take 1 tablet by mouth 2 (two) times daily. 03/05/23   Laurier Nancy, MD  Sod Picosulfate-Mag Ox-Cit Acd (CLENPIQ) 10-3.5-12 MG-GM -GM/160ML SOLN Take 1 bottle at 5 PM followed by five 8 oz cups of water and repeat 5 hours before procedure. 03/17/23   Wyline Mood, MD    Allergies as of 03/17/2023 - Review Complete 03/09/2023  Allergen Reaction Noted   Amlodipine Hives and Swelling 12/24/2014   Tramadol Other (See Comments) 06/14/2019   Isosorbide dinitrate  07/06/2018   Codeine Nausea Only 09/11/2020   Etodolac Other (See Comments) 04/22/2016    Family History  Problem Relation Age of Onset   Breast cancer Maternal Aunt 1   Diabetes Mother    Leukemia Mother    Heart Problems Mother    Diabetes Sister    Mental illness Sister    Colon polyps Sister    Stroke Father    Cancer Brother        rectal   Healthy Daughter    Hypertension Son    Diabetes Sister    Thyroid disease Sister    Healthy Sister    Healthy Sister    Stroke Brother    Healthy Son    Healthy Son    Healthy Daughter     Social History   Socioeconomic History   Marital status: Single    Spouse name: Not on file   Number of children: Not on file   Years of education: Not on file   Highest education level: 10th grade  Occupational History   Occupation: retired  Tobacco Use   Smoking status: Never   Smokeless tobacco: Former    Types: Associate Professor status: Never Used  Substance and Sexual Activity   Alcohol use: No   Drug use: No   Sexual activity: Not Currently  Other Topics Concern   Not on file  Social History Narrative   Not on file   Social Drivers of Health    Financial Resource Strain: Low Risk  (04/27/2017)   Overall Financial Resource Strain (CARDIA)    Difficulty of Paying Living Expenses: Not hard at all  Food Insecurity: No Food Insecurity (04/27/2017)   Hunger Vital Sign    Worried About Running Out of Food in the Last Year: Never true    Ran Out of Food in the Last Year: Never true  Transportation Needs: No Transportation Needs (04/27/2017)  PRAPARE - Administrator, Civil Service (Medical): No    Lack of Transportation (Non-Medical): No  Physical Activity: Sufficiently Active (12/20/2018)   Exercise Vital Sign    Days of Exercise per Week: 4 days    Minutes of Exercise per Session: 60 min  Stress: No Stress Concern Present (03/12/2017)   [employer] of Occupational Health - Occupational Stress Questionnaire    Feeling of Stress : Only a little  Social Connections: Moderately Integrated (04/27/2017)   Social Connection and Isolation Panel [NHANES]    Frequency of Communication with Friends and Family: More than three times a week    Frequency of Social Gatherings with Friends and Family: Once a week    Attends Religious Services: More than 4 times per year    Active Member of [employer] or Organizations: Yes    Attends Banker Meetings: More than 4 times per year    Marital Status: Never married  Intimate Partner Violence: Unknown (03/12/2017)   Humiliation, Afraid, Rape, and Kick questionnaire    Fear of Current or Ex-Partner: Patient declined    Emotionally Abused: Patient declined    Physically Abused: Patient declined    Sexually Abused: Patient declined    Review of Systems: See HPI, otherwise negative ROS  Physical Exam: LMP  (LMP Unknown)  General:   Alert,  pleasant and cooperative in NAD Head:  Normocephalic and atraumatic. Neck:  Supple; no masses or thyromegaly. Lungs:  Clear throughout to auscultation, normal respiratory effort.    Heart:  +S1, +S2, Regular rate and rhythm, No  edema. Abdomen:  Soft, nontender and nondistended. Normal bowel sounds, without guarding, and without rebound.   Neurologic:  Alert and  oriented x4;  grossly normal neurologically.  Impression/Plan: Helen Mccormick is here for an endoscopy and colonoscopy  to be performed for  evaluation of melena    Risks, benefits, limitations, and alternatives regarding endoscopy have been reviewed with the patient.  Questions have been answered.  All parties agreeable.   Wyline Mood, MD  04/20/2023, 8:40 AM

## 2023-04-21 ENCOUNTER — Encounter: Payer: Self-pay | Admitting: Gastroenterology

## 2023-04-21 LAB — SURGICAL PATHOLOGY

## 2023-04-22 ENCOUNTER — Encounter: Payer: Self-pay | Admitting: Gastroenterology

## 2023-04-24 ENCOUNTER — Other Ambulatory Visit: Payer: Self-pay | Admitting: Cardiovascular Disease

## 2023-04-24 DIAGNOSIS — I428 Other cardiomyopathies: Secondary | ICD-10-CM

## 2023-04-24 DIAGNOSIS — I10 Essential (primary) hypertension: Secondary | ICD-10-CM

## 2023-04-24 DIAGNOSIS — R42 Dizziness and giddiness: Secondary | ICD-10-CM

## 2023-04-24 DIAGNOSIS — E1169 Type 2 diabetes mellitus with other specified complication: Secondary | ICD-10-CM

## 2023-04-24 DIAGNOSIS — E785 Hyperlipidemia, unspecified: Secondary | ICD-10-CM

## 2023-04-27 NOTE — Progress Notes (Deleted)
 Referring Physician:  [name], MD [address] [place],  [place] [address]  Primary Physician:  [name], MD  History of Present Illness: 04/27/2023 Ms. Courtney Bellizzi is here today with a chief complaint of ***  Balance and dexterity issues.   Baldwin Crown Perry has ***no symptoms of cervical myelopathy.  The symptoms are causing a significant impact on the patient's life.   I have utilized the care everywhere function in epic to review the outside records available from external health systems.   Progress Note from Drake Leach, [place] on 04/08/23:   History of Present Illness: Latarra Eagleton is a 83 y.o. female has a history of HTN, nonischemic cardiomyopathy, OSA, asthma, gastritis, DM, hyperlipidemia, hypothyroidism, and macular degeneration both eyes.    She had T12 kyphoplasty by IR on 10/20/22. This helped her mid back pain. History of cervical fusion years ago. History of lumbar surgery years ago.    Phone visit done on 01/18/23. She is a poor historian.    She has moderate central stenosis C2-C3 with right foraminal stenosis, fusion C3-C4, and right foraminal stenosis C4-C5 with question of myelomalacia on left and mild central stenosis.    She has known lumbar spondylosis with slip L3-L4 and L4-L5 along with facet hypertrophy and bilateral foraminal stenosis L3-S1. LBP is likely from underlying spondylosis.    Cervical MRI reviewed with Dr. Myer Haff regarding question of myelomalacia at C4-C5. Would follow for now unless she has worsening of myelopathic symptoms.    May consider EMG at some point if she continues with numbness in left hand.    She was sent back to Dr. Mariah Milling to discuss possible lumbar injections. She had bilateral L5-S1 TF ESI on 02/05/23 with great improvement. Advised to call back for right C5-C6 TF ESI when needed.    She is here for follow up.    She has intermittent neck pain with bilateral shoulder pain into both arms into her hands.  She has intermittent numbness and tingling in both hands. She has weakness in both hands. She thinks her balance has gotten worse. Has trouble with buttons, can put in her earrings.    She had good relief with lumbar ESI in November but is still having intermittent  LBP with right lateral leg pain to her ankle. Pain is worse with standing and walking. She has pain when she gets up from sitting. She has numbness and tingling in her legs. Occasional weakness in her legs.    No bowel or bladder issues except for urinary urgency.    History of cervical fusion 4 years ago- she got better after the surgery.      Conservative measures:  Physical therapy:  denies Multimodal medical therapy including regular antiinflammatories: oxycodone, flexeril, mobic  Injections: -s/p bilateral L5-S1 TFESI 02/05/2023 with 90% improvement  -s/p right C5-6 TFESI 05/29/2022 with 70% relief -s/p left L4-5 and L5-S1 TFESI 03/16/2022 with 100 % improvement     Past Surgery:  T12 kyphoplasty by IR on 10/20/22 Cervical fusion 4 years ago.    Review of Systems:  A 10 point review of systems is negative, except for the pertinent positives and negatives detailed in the HPI.  Past Medical History: Past Medical History:  Diagnosis Date   Allergy    Anxiety    CHF (congestive heart failure) (HCC)    Colon polyp    Depression    GERD (gastroesophageal reflux disease)    Heart murmur  Hyperlipidemia    Hypertension    Hypothyroidism    Sleep apnea    Thyroid disease     Past Surgical History: Past Surgical History:  Procedure Laterality Date   ABDOMINAL HYSTERECTOMY     BACK SURGERY     BACK SURGERY     CERVICAL FUSION     COLONOSCOPY WITH PROPOFOL N/A 05/28/2017   Procedure: COLONOSCOPY WITH PROPOFOL;  Surgeon: Wyline Mood, MD;  Location: [facility] ENDOSCOPY;  Service: Gastroenterology;  Laterality: N/A;   COLONOSCOPY WITH PROPOFOL N/A 11/17/2019   Procedure: COLONOSCOPY WITH PROPOFOL;  Surgeon: Regis Bill, MD;  Location: [facility] ENDOSCOPY;  Service: Endoscopy;  Laterality: N/A;   COLONOSCOPY WITH PROPOFOL N/A 04/20/2023   Procedure: COLONOSCOPY WITH PROPOFOL;  Surgeon: Wyline Mood, MD;  Location: [facility] ENDOSCOPY;  Service: Gastroenterology;  Laterality: N/A;   ESOPHAGOGASTRODUODENOSCOPY (EGD) WITH PROPOFOL N/A 11/17/2019   Procedure: ESOPHAGOGASTRODUODENOSCOPY (EGD) WITH PROPOFOL;  Surgeon: Regis Bill, MD;  Location: [facility] ENDOSCOPY;  Service: Endoscopy;  Laterality: N/A;   ESOPHAGOGASTRODUODENOSCOPY (EGD) WITH PROPOFOL N/A 04/20/2023   Procedure: ESOPHAGOGASTRODUODENOSCOPY (EGD) WITH PROPOFOL;  Surgeon: Wyline Mood, MD;  Location: [facility] ENDOSCOPY;  Service: Gastroenterology;  Laterality: N/A;   IR KYPHO THORACIC WITH BONE BIOPSY  10/20/2022   IR RADIOLOGIST EVAL & MGMT  09/22/2022   NECK SURGERY     POLYPECTOMY  04/20/2023   Procedure: POLYPECTOMY;  Surgeon: Wyline Mood, MD;  Location: [facility] ENDOSCOPY;  Service: Gastroenterology;;   THYROID SURGERY     THYROID SURGERY      Allergies: Allergies as of 04/29/2023 - Review Complete 04/20/2023  Allergen Reaction Noted   Amlodipine Hives and Swelling 12/24/2014   Tramadol Other (See Comments) 06/14/2019   Isosorbide dinitrate  07/06/2018   Codeine Nausea Only 09/11/2020   Etodolac Other (See Comments) 04/22/2016    Medications:  Current Outpatient Medications:    Accu-Chek Softclix Lancets lancets, Use as instructed, Disp: 100 each, Rfl: 12   albuterol (VENTOLIN HFA) 108 (90 Base) MCG/ACT inhaler, Inhale 2 puffs by mouth every 6 hours as needed for wheezing/shortness of breath, Disp: 8.5 each, Rfl: 11   ARNUITY ELLIPTA 100 MCG/ACT AEPB, Inhale 1 puff by mouth every day, Disp: 30 each, Rfl: 11   Azelastine HCl 137 MCG/SPRAY SOLN, Use 1 spray into each nostril every day as directed, Disp: 11.1 mL, Rfl: 11   Blood Glucose Monitoring Suppl (ACCU-CHEK GUIDE) w/Device KIT, See admin instructions., Disp: , Rfl:    busPIRone (BUSPAR) 15 MG  tablet, Take 15 mg by mouth 2 (two) times daily., Disp: , Rfl:    carvedilol (COREG) 12.5 MG tablet, Take 12.5 mg by mouth 2 (two) times daily., Disp: , Rfl:    Cholecalciferol (VITAMIN D-3) 25 MCG (1000 UT) CAPS, Take by mouth., Disp: , Rfl:    clotrimazole-betamethasone (LOTRISONE) cream, Apply 1 Application topically 2 (two) times daily., Disp: 30 g, Rfl: 0   empagliflozin (JARDIANCE) 25 MG TABS tablet, Take 1 tablet (25 mg total) by mouth daily., Disp: 90 tablet, Rfl: 3   ENTRESTO 97-103 MG, Take 1 tablet by mouth twice daily, Disp: 60 tablet, Rfl: 11   fluticasone (FLOVENT HFA) 110 MCG/ACT inhaler, Inhale 1 puff into the lungs 2 (two) times daily., Disp: , Rfl:    furosemide (LASIX) 20 MG tablet, TAKE 1 TABLET BY MOUTH MONDAY WEDNESDAY FRIDAY, Disp: 90 tablet, Rfl: 3   ibuprofen (ADVIL) 200 MG tablet, Take 200 mg by mouth every 6 (six) hours as needed., Disp: , Rfl:  isosorbide dinitrate (ISORDIL) 30 MG tablet, Take 1 tablet by mouth daily, Disp: 30 tablet, Rfl: 11   levothyroxine (SYNTHROID) 75 MCG tablet, Take 1 tablet (75 mcg total) by mouth daily., Disp: 90 tablet, Rfl: 3   meclizine (ANTIVERT) 12.5 MG tablet, Take 1 tablet (12.5 mg total) by mouth 3 (three) times daily as needed for dizziness., Disp: 90 tablet, Rfl: 0   nitroGLYCERIN (NITROSTAT) 0.4 MG SL tablet, Place 1 tablet (0.4 mg total) under the tongue every 5 (five) minutes as needed for chest pain., Disp: 50 tablet, Rfl: 3   Omega-3 Fatty Acids (FISH OIL) 1000 MG CAPS, Take by mouth., Disp: , Rfl:    ONETOUCH VERIO test strip, USE TO CHECK BLOOD SUGAR TWICE DAILY, Disp: 100 strip, Rfl: 6   pantoprazole (PROTONIX) 40 MG tablet, Take one tablet by mouth daily, Disp: 30 tablet, Rfl: 11   polyethylene glycol powder (GLYCOLAX/MIRALAX) 17 GM/SCOOP powder, , Disp: , Rfl:    rosuvastatin (CRESTOR) 40 MG tablet, Take 40 mg by mouth daily., Disp: , Rfl:    Sod Picosulfate-Mag Ox-Cit Acd (CLENPIQ) 10-3.5-12 MG-GM -GM/160ML SOLN, Take 1  bottle at 5 PM followed by five 8 oz cups of water and repeat 5 hours before procedure., Disp: 320 mL, Rfl: 0  Social History: Social History   Tobacco Use   Smoking status: Never   Smokeless tobacco: Former    Types: Associate Professor status: Never Used  Substance Use Topics   Alcohol use: No   Drug use: No    Family Medical History: Family History  Problem Relation Age of Onset   Breast cancer Maternal Aunt 57   Diabetes Mother    Leukemia Mother    Heart Problems Mother    Diabetes Sister    Mental illness Sister    Colon polyps Sister    Stroke Father    Cancer Brother        rectal   Healthy Daughter    Hypertension Son    Diabetes Sister    Thyroid disease Sister    Healthy Sister    Healthy Sister    Stroke Brother    Healthy Son    Healthy Son    Healthy Daughter     Physical Examination: There were no vitals filed for this visit.  General: Patient is in no apparent distress. Attention to examination is appropriate.  Neck:   Supple.  Full range of motion.  Respiratory: Patient is breathing without any difficulty.   NEUROLOGICAL:     Awake, alert, oriented to person, place, and time.  Speech is clear and fluent.   Cranial Nerves: Pupils equal round and reactive to light.  Facial tone is symmetric.  Facial sensation is symmetric. Shoulder shrug is symmetric. Tongue protrusion is midline.  There is no pronator drift.  Strength: Side Biceps Triceps Deltoid Interossei Grip Wrist Ext. Wrist Flex.  R 5 5 5 5 5 5 5   L 5 5 5 5 5 5 5    Side Iliopsoas Quads Hamstring PF DF EHL  R 5 5 5 5 5 5   L 5 5 5 5 5 5    Reflexes are ***2+ and symmetric at the biceps, triceps, brachioradialis, patella and achilles.   Hoffman's is absent.   Bilateral upper and lower extremity sensation is intact to light touch.    No evidence of dysmetria noted.  Gait is normal.     Medical Decision Making  Imaging: ***  I have  personally reviewed the images and  agree with the above interpretation.  Assessment and Plan: Ms. Virella is a pleasant 83 y.o. female with ***    Thank you for involving me in the care of this patient.      Chester K. Myer Haff MD, [facility]

## 2023-04-29 ENCOUNTER — Ambulatory Visit: Payer: Medicare HMO | Admitting: Neurosurgery

## 2023-05-03 ENCOUNTER — Encounter: Payer: Self-pay | Admitting: Physician Assistant

## 2023-05-04 ENCOUNTER — Ambulatory Visit (INDEPENDENT_AMBULATORY_CARE_PROVIDER_SITE_OTHER): Payer: 59 | Admitting: Physician Assistant

## 2023-05-04 ENCOUNTER — Ambulatory Visit: Payer: 59 | Admitting: Physician Assistant

## 2023-05-04 ENCOUNTER — Encounter: Payer: Self-pay | Admitting: Physician Assistant

## 2023-05-04 VITALS — BP 105/61 | HR 69 | Temp 97.9°F | Ht 64.0 in | Wt 157.6 lb

## 2023-05-04 DIAGNOSIS — D509 Iron deficiency anemia, unspecified: Secondary | ICD-10-CM

## 2023-05-04 NOTE — Progress Notes (Signed)
 Ellouise Console, PA-C [address]  [address]  [place], [place] [address]  Main: [phone]  Fax: [phone]   Primary Care Physician: Fernand Fredy RAMAN, MD  Primary Gastroenterologist:  Ellouise Console, PA-C / Dr. Ruel Kung    CC:  F/U iron deficiency anemia  HPI: Helen Mccormick is a 83 y.o. female, estab pt. Of Dr. Kung, returns for 3 month f/u iron deficiency anemia.  She is currently doing well with no GI symptoms.  Specifically, she denies abdominal pain, diarrhea, constipation, melena, hematochezia, or weight loss.  Iron tablet was discontinued by her PCP.  Not currently on iron.  She has no GI concerns today.  04/20/23 EGD:  Normal.  No biopsies.  04/20/23 Colonoscopy: one small 5mm polyp.  Diverticulosis.  No repeat.  Colonoscopy by Dr. Maryruth in 11/17/2019 showed diverticulosis of the colon and no polyps.  01/2023 Labs: Hgb 11.9  Current Outpatient Medications  Medication Sig Dispense Refill   Accu-Chek Softclix Lancets lancets Use as instructed 100 each 12   albuterol  (VENTOLIN  HFA) 108 (90 Base) MCG/ACT inhaler Inhale 2 puffs by mouth every 6 hours as needed for wheezing/shortness of breath 8.5 each 11   ARNUITY ELLIPTA  100 MCG/ACT AEPB Inhale 1 puff by mouth every day 30 each 11   Azelastine HCl 137 MCG/SPRAY SOLN Use 1 spray into each nostril every day as directed 11.1 mL 11   Blood Glucose Monitoring Suppl (ACCU-CHEK GUIDE) w/Device KIT See admin instructions.     busPIRone (BUSPAR) 15 MG tablet Take 15 mg by mouth 2 (two) times daily.     carvedilol  (COREG ) 12.5 MG tablet Take 12.5 mg by mouth 2 (two) times daily.     Cholecalciferol  (VITAMIN D -3) 25 MCG (1000 UT) CAPS Take by mouth.     clotrimazole -betamethasone  (LOTRISONE ) cream Apply 1 Application topically 2 (two) times daily. 30 g 0   empagliflozin  (JARDIANCE ) 25 MG TABS tablet Take 1 tablet (25 mg total) by mouth daily. 90 tablet 3   ENTRESTO  97-103 MG Take 1 tablet by mouth twice daily 60 tablet 11    fluticasone  (FLOVENT  HFA) 110 MCG/ACT inhaler Inhale 1 puff into the lungs 2 (two) times daily.     ibuprofen  (ADVIL ) 200 MG tablet Take 200 mg by mouth every 6 (six) hours as needed.     isosorbide  dinitrate (ISORDIL ) 30 MG tablet Take 1 tablet by mouth daily 30 tablet 11   levothyroxine  (SYNTHROID ) 75 MCG tablet Take 1 tablet (75 mcg total) by mouth daily. 90 tablet 3   meclizine  (ANTIVERT ) 12.5 MG tablet Take 1 tablet (12.5 mg total) by mouth 3 (three) times daily as needed for dizziness. 90 tablet 0   nitroGLYCERIN  (NITROSTAT ) 0.4 MG SL tablet Place 1 tablet (0.4 mg total) under the tongue every 5 (five) minutes as needed for chest pain. 50 tablet 3   ONETOUCH VERIO test strip USE TO CHECK BLOOD SUGAR TWICE DAILY 100 strip 6   pantoprazole  (PROTONIX ) 40 MG tablet Take one tablet by mouth daily 30 tablet 11   polyethylene glycol powder (GLYCOLAX /MIRALAX ) 17 GM/SCOOP powder      rosuvastatin  (CRESTOR ) 40 MG tablet Take 40 mg by mouth daily.     No current facility-administered medications for this visit.    Allergies as of 05/04/2023 - Review Complete 05/04/2023  Allergen Reaction Noted   Amlodipine  Hives and Swelling 12/24/2014   Tramadol  Other (See Comments) 06/14/2019   Isosorbide  dinitrate  07/06/2018   Codeine Nausea Only 09/11/2020  Etodolac Other (See Comments) 04/22/2016    Past Medical History:  Diagnosis Date   Allergy    Anxiety    CHF (congestive heart failure) (HCC)    Colon polyp    Depression    GERD (gastroesophageal reflux disease)    Heart murmur    Hyperlipidemia    Hypertension    Hypothyroidism    Sleep apnea    Thyroid  disease     Past Surgical History:  Procedure Laterality Date   ABDOMINAL HYSTERECTOMY     BACK SURGERY     BACK SURGERY     CERVICAL FUSION     COLONOSCOPY WITH PROPOFOL  N/A 05/28/2017   Procedure: COLONOSCOPY WITH PROPOFOL ;  Surgeon: Therisa Bi, MD;  Location: [facility] ENDOSCOPY;  Service: Gastroenterology;  Laterality: N/A;    COLONOSCOPY WITH PROPOFOL  N/A 11/17/2019   Procedure: COLONOSCOPY WITH PROPOFOL ;  Surgeon: Maryruth Ole DASEN, MD;  Location: [facility] ENDOSCOPY;  Service: Endoscopy;  Laterality: N/A;   COLONOSCOPY WITH PROPOFOL  N/A 04/20/2023   Procedure: COLONOSCOPY WITH PROPOFOL ;  Surgeon: Therisa Bi, MD;  Location: [facility] ENDOSCOPY;  Service: Gastroenterology;  Laterality: N/A;   ESOPHAGOGASTRODUODENOSCOPY (EGD) WITH PROPOFOL  N/A 11/17/2019   Procedure: ESOPHAGOGASTRODUODENOSCOPY (EGD) WITH PROPOFOL ;  Surgeon: Maryruth Ole DASEN, MD;  Location: [facility] ENDOSCOPY;  Service: Endoscopy;  Laterality: N/A;   ESOPHAGOGASTRODUODENOSCOPY (EGD) WITH PROPOFOL  N/A 04/20/2023   Procedure: ESOPHAGOGASTRODUODENOSCOPY (EGD) WITH PROPOFOL ;  Surgeon: Therisa Bi, MD;  Location: [facility] ENDOSCOPY;  Service: Gastroenterology;  Laterality: N/A;   IR KYPHO THORACIC WITH BONE BIOPSY  10/20/2022   IR RADIOLOGIST EVAL & MGMT  09/22/2022   NECK SURGERY     POLYPECTOMY  04/20/2023   Procedure: POLYPECTOMY;  Surgeon: Therisa Bi, MD;  Location: [facility] ENDOSCOPY;  Service: Gastroenterology;;   THYROID  SURGERY     THYROID  SURGERY      Review of Systems:    All systems reviewed and negative except where noted in HPI.   Physical Examination:   BP 105/61   Pulse 69   Temp 97.9 F (36.6 C)   Ht 5' 4 (1.626 m)   Wt 157 lb 9.6 oz (71.5 kg)   LMP  (LMP Unknown)   BMI 27.05 kg/m   General: Well-nourished, well-developed in no acute distress.  Neuro: Alert and oriented x 3.  Grossly intact.  Psych: Alert and cooperative, normal mood and affect.   Imaging Studies: No results found.  Assessment and Plan:   Helen Mccormick is a 83 y.o. y/o female returns for follow-up of iron deficiency anemia.  Recent EGD and colonoscopy were unrevealing / normal.  She is currently asymptomatic.  Not currently on iron.  1.  Iron deficiency anemia  Labs: CBC, iron panel, ferritin  Reassurance regarding normal EGD and colonoscopy.  If labs are normal,  then we will turn her care over to her PCP to monitor lab work in the future.  No further GI tests are recommended at this time.  Ellouise Console, PA-C  Follow up if she develops GI symptoms or recurrent iron deficiency anemia.

## 2023-05-05 DIAGNOSIS — D509 Iron deficiency anemia, unspecified: Secondary | ICD-10-CM | POA: Diagnosis not present

## 2023-05-05 NOTE — Progress Notes (Signed)
 Referring Physician:  Fernand Fredy RAMAN, MD [address] [place],  [place] [address]  Primary Physician:  Fernand Fredy RAMAN, MD  History of Present Illness: 05/06/2023 Ms. Ahmiya Abee is here today with a chief complaint of ongoing balance issues.  She has had some pain in her left thumb and right knee.  She denies falls.  She previously had neck surgery 20 years ago.  This has been a slow accumulation of issues over time.  She did express that she is not interested in further surgery.   Progress Note from Glade Boys, [place] on 04/08/23:   History of Present Illness: Cayla Wiegand is a 83 y.o. female has a history of HTN, nonischemic cardiomyopathy, OSA, asthma, gastritis, DM, hyperlipidemia, hypothyroidism, and macular degeneration both eyes.    She had T12 kyphoplasty by IR on 10/20/22. This helped her mid back pain. History of cervical fusion years ago. History of lumbar surgery years ago.    Phone visit done on 01/18/23. She is a poor historian.    She has moderate central stenosis C2-C3 with right foraminal stenosis, fusion C3-C4, and right foraminal stenosis C4-C5 with question of myelomalacia on left and mild central stenosis.    She has known lumbar spondylosis with slip L3-L4 and L4-L5 along with facet hypertrophy and bilateral foraminal stenosis L3-S1. LBP is likely from underlying spondylosis.    Cervical MRI reviewed with Dr. Clois regarding question of myelomalacia at C4-C5. Would follow for now unless she has worsening of myelopathic symptoms.    May consider EMG at some point if she continues with numbness in left hand.    She was sent back to Dr. Dodson to discuss possible lumbar injections. She had bilateral L5-S1 TF ESI on 02/05/23 with great improvement. Advised to call back for right C5-C6 TF ESI when needed.    She is here for follow up.    She has intermittent neck pain with bilateral shoulder pain into both arms into her hands. She has intermittent numbness and  tingling in both hands. She has weakness in both hands. She thinks her balance has gotten worse. Has trouble with buttons, can put in her earrings.    She had good relief with lumbar ESI in November but is still having intermittent  LBP with right lateral leg pain to her ankle. Pain is worse with standing and walking. She has pain when she gets up from sitting. She has numbness and tingling in her legs. Occasional weakness in her legs.    No bowel or bladder issues except for urinary urgency.    History of cervical fusion 4 years ago- she got better after the surgery.      Conservative measures:  Physical therapy:  denies Multimodal medical therapy including regular antiinflammatories: oxycodone , flexeril , mobic   Injections: -s/p bilateral L5-S1 TFESI 02/05/2023 with 90% improvement  -s/p right C5-6 TFESI 05/29/2022 with 70% relief -s/p left L4-5 and L5-S1 TFESI 03/16/2022 with 100 % improvement     Past Surgery:  T12 kyphoplasty by IR on 10/20/22 Cervical fusion 4 years ago.    Review of Systems:  A 10 point review of systems is negative, except for the pertinent positives and negatives detailed in the HPI.  Past Medical History: Past Medical History:  Diagnosis Date   Allergy    Anxiety    CHF (congestive heart failure) (HCC)    Colon polyp    Depression    GERD (gastroesophageal reflux disease)    Heart murmur  Hyperlipidemia    Hypertension    Hypothyroidism    Sleep apnea    Thyroid  disease     Past Surgical History: Past Surgical History:  Procedure Laterality Date   ABDOMINAL HYSTERECTOMY     BACK SURGERY     BACK SURGERY     CERVICAL FUSION     COLONOSCOPY WITH PROPOFOL  N/A 05/28/2017   Procedure: COLONOSCOPY WITH PROPOFOL ;  Surgeon: Therisa Bi, MD;  Location: [facility] ENDOSCOPY;  Service: Gastroenterology;  Laterality: N/A;   COLONOSCOPY WITH PROPOFOL  N/A 11/17/2019   Procedure: COLONOSCOPY WITH PROPOFOL ;  Surgeon: Maryruth Ole DASEN, MD;  Location: [facility]  ENDOSCOPY;  Service: Endoscopy;  Laterality: N/A;   COLONOSCOPY WITH PROPOFOL  N/A 04/20/2023   Procedure: COLONOSCOPY WITH PROPOFOL ;  Surgeon: Therisa Bi, MD;  Location: [facility] ENDOSCOPY;  Service: Gastroenterology;  Laterality: N/A;   ESOPHAGOGASTRODUODENOSCOPY (EGD) WITH PROPOFOL  N/A 11/17/2019   Procedure: ESOPHAGOGASTRODUODENOSCOPY (EGD) WITH PROPOFOL ;  Surgeon: Maryruth Ole DASEN, MD;  Location: [facility] ENDOSCOPY;  Service: Endoscopy;  Laterality: N/A;   ESOPHAGOGASTRODUODENOSCOPY (EGD) WITH PROPOFOL  N/A 04/20/2023   Procedure: ESOPHAGOGASTRODUODENOSCOPY (EGD) WITH PROPOFOL ;  Surgeon: Therisa Bi, MD;  Location: [facility] ENDOSCOPY;  Service: Gastroenterology;  Laterality: N/A;   IR KYPHO THORACIC WITH BONE BIOPSY  10/20/2022   IR RADIOLOGIST EVAL & MGMT  09/22/2022   NECK SURGERY     POLYPECTOMY  04/20/2023   Procedure: POLYPECTOMY;  Surgeon: Therisa Bi, MD;  Location: [facility] ENDOSCOPY;  Service: Gastroenterology;;   THYROID  SURGERY     THYROID  SURGERY      Allergies: Allergies as of 05/06/2023 - Review Complete 05/06/2023  Allergen Reaction Noted   Amlodipine  Hives and Swelling 12/24/2014   Tramadol  Other (See Comments) 06/14/2019   Isosorbide  dinitrate  07/06/2018   Codeine Nausea Only 09/11/2020   Etodolac Other (See Comments) 04/22/2016    Medications:  Current Outpatient Medications:    Accu-Chek Softclix Lancets lancets, Use as instructed, Disp: 100 each, Rfl: 12   albuterol  (VENTOLIN  HFA) 108 (90 Base) MCG/ACT inhaler, Inhale 2 puffs by mouth every 6 hours as needed for wheezing/shortness of breath, Disp: 8.5 each, Rfl: 11   ARNUITY ELLIPTA  100 MCG/ACT AEPB, Inhale 1 puff by mouth every day, Disp: 30 each, Rfl: 11   Azelastine HCl 137 MCG/SPRAY SOLN, Use 1 spray into each nostril every day as directed, Disp: 11.1 mL, Rfl: 11   Blood Glucose Monitoring Suppl (ACCU-CHEK GUIDE) w/Device KIT, See admin instructions., Disp: , Rfl:    busPIRone (BUSPAR) 15 MG tablet, Take 15 mg by mouth 2  (two) times daily., Disp: , Rfl:    carvedilol  (COREG ) 12.5 MG tablet, Take 12.5 mg by mouth 2 (two) times daily., Disp: , Rfl:    empagliflozin  (JARDIANCE ) 25 MG TABS tablet, Take 1 tablet (25 mg total) by mouth daily., Disp: 90 tablet, Rfl: 3   ENTRESTO  97-103 MG, Take 1 tablet by mouth twice daily, Disp: 60 tablet, Rfl: 11   fluticasone  (FLOVENT  HFA) 110 MCG/ACT inhaler, Inhale 1 puff into the lungs 2 (two) times daily., Disp: , Rfl:    ibuprofen  (ADVIL ) 200 MG tablet, Take 200 mg by mouth every 6 (six) hours as needed., Disp: , Rfl:    isosorbide  dinitrate (ISORDIL ) 30 MG tablet, Take 1 tablet by mouth daily, Disp: 30 tablet, Rfl: 11   levothyroxine  (SYNTHROID ) 75 MCG tablet, Take 1 tablet (75 mcg total) by mouth daily., Disp: 90 tablet, Rfl: 3   meclizine  (ANTIVERT ) 12.5 MG tablet, Take 1 tablet (12.5 mg total) by mouth  3 (three) times daily as needed for dizziness., Disp: 90 tablet, Rfl: 0   nitroGLYCERIN  (NITROSTAT ) 0.4 MG SL tablet, Place 1 tablet (0.4 mg total) under the tongue every 5 (five) minutes as needed for chest pain., Disp: 50 tablet, Rfl: 3   ONETOUCH VERIO test strip, USE TO CHECK BLOOD SUGAR TWICE DAILY, Disp: 100 strip, Rfl: 6   pantoprazole  (PROTONIX ) 40 MG tablet, Take one tablet by mouth daily, Disp: 30 tablet, Rfl: 11   rosuvastatin  (CRESTOR ) 40 MG tablet, Take 40 mg by mouth daily., Disp: , Rfl:   Social History: Social History   Tobacco Use   Smoking status: Never   Smokeless tobacco: Former    Types: Associate Professor status: Never Used  Substance Use Topics   Alcohol use: No   Drug use: No    Family Medical History: Family History  Problem Relation Age of Onset   Breast cancer Maternal Aunt 40   Diabetes Mother    Leukemia Mother    Heart Problems Mother    Diabetes Sister    Mental illness Sister    Colon polyps Sister    Stroke Father    Cancer Brother        rectal   Healthy Daughter    Hypertension Son    Diabetes Sister    Thyroid   disease Sister    Healthy Sister    Healthy Sister    Stroke Brother    Healthy Son    Healthy Son    Healthy Daughter     Physical Examination: Vitals:   05/06/23 1424  BP: 138/78    General: Patient is in no apparent distress. Attention to examination is appropriate.  Neck:   Supple.  Full range of motion.  Respiratory: Patient is breathing without any difficulty.   NEUROLOGICAL:     Awake, alert, oriented to person, place, and time.  Speech is clear and fluent.   Cranial Nerves: Pupils equal round and reactive to light.  Facial tone is symmetric.  Facial sensation is symmetric. Shoulder shrug is symmetric. Tongue protrusion is midline.  There is no pronator drift.  Strength: Side Biceps Triceps Deltoid Interossei Grip Wrist Ext. Wrist Flex.  R 5 5 5 5  4+ 5 5  L 5 5 5 5  4+ 5 5   Side Iliopsoas Quads Hamstring PF DF EHL  R 5 5 5 5 5 5   L 5 5 5 5 5 5    Reflexes are 1+ and symmetric at the biceps, triceps, brachioradialis, patella and achilles.   Hoffman's is absent.   Bilateral upper and lower extremity sensation is intact to light touch.    No evidence of dysmetria noted.  Gait is slowed.  She has mild tenderness to palpation around the base of her left thumb.  She has moderate tenderness to palpation around her right knee joint..     Medical Decision Making  Imaging: MRI CL spine 12/23/2022 Disc levels:   C2-3: Degenerative facet and uncovertebral spurring on the right where there is moderate stenosis.   C3-4: ACDF with solid arthrodesis and no impingement   C4-5: Facet degeneration especially pronounced on the right with mild anterolisthesis. Uncovertebral spurring on both sides. Right foraminal impingement.   C5-6: Uncovertebral and facet spurring. Disc space narrowing and ridging. No neural impingement   C6-7: Disc space narrowing and bulging. Degenerative facet spurring asymmetric to the left. No neural impingement   C7-T1:Mild facet spurring.    IMPRESSION: Uncomplicated  C3-4 ACDF with solid arthrodesis and no impingement.   Degenerative foraminal impingement on the right at C2-3 and C4-5.   Question minimal myelomalacia in the left cord at C4-5.     Electronically Signed   By: Dorn Roulette M.D.   On: 01/16/2023 06:34   IMPRESSION: 1. At L3-4 there is a broad-based disc bulge. Moderate bilateral facet arthropathy with ligamentum flavum infolding. Moderate spinal stenosis. Bilateral lateral recess stenosis. Mild right and moderate left foraminal stenosis. 2. At L4-5 there is a broad-based disc bulge. Severe bilateral facet arthropathy with ligamentum flavum infolding. Moderate spinal stenosis. Bilateral lateral recess stenosis. Moderate left and mild-moderate right foraminal stenosis. 3. At L5-S1 there is a mild broad-based disc bulge. Mild bilateral facet arthropathy. Mild left and moderate right foraminal stenosis. 4. Chronic T12 vertebral body compression fracture with 10% height loss, prior augmentation, and mild residual marrow edema in the posterior aspect of the vertebral body. Mild marrow edema partially visualized in the posterior aspect of the T11 vertebral body.     Electronically Signed   By: Julaine Blanch M.D.   On: 01/14/2023 10:56    I have personally reviewed the images and agree with the above interpretation.  Assessment and Plan: Ms. Manzer is a pleasant 83 y.o. 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  I would not recommend intervention.  She does have an anterolisthesis of L4 and L5.  She is having some symptoms of low back pain with right-sided sciatica, but is not interested in surgical intervention.  She would like to go back to Dr. Mozelle and manage this with injections.  I offered orthopedic referral for her knee and hand, but she declined.  I will see her back on an as-needed basis.  I spent a total of 30 minutes in this patient's care today. This  time was spent reviewing pertinent records including imaging studies, obtaining and confirming history, performing a directed evaluation, formulating and discussing my recommendations, and documenting the visit within the medical record.      Thank you for involving me in the care of this patient.      Maat Kafer K. Clois MD, [facility]

## 2023-05-06 ENCOUNTER — Ambulatory Visit: Payer: 59 | Admitting: Neurosurgery

## 2023-05-06 ENCOUNTER — Ambulatory Visit: Payer: 59 | Admitting: Cardiovascular Disease

## 2023-05-06 ENCOUNTER — Encounter: Payer: Self-pay | Admitting: Physician Assistant

## 2023-05-06 VITALS — BP 138/78 | Ht 64.0 in | Wt 157.0 lb

## 2023-05-06 DIAGNOSIS — M5441 Lumbago with sciatica, right side: Secondary | ICD-10-CM

## 2023-05-06 DIAGNOSIS — G959 Disease of spinal cord, unspecified: Secondary | ICD-10-CM

## 2023-05-06 DIAGNOSIS — Z981 Arthrodesis status: Secondary | ICD-10-CM

## 2023-05-06 DIAGNOSIS — M4316 Spondylolisthesis, lumbar region: Secondary | ICD-10-CM | POA: Diagnosis not present

## 2023-05-06 LAB — CBC WITH DIFFERENTIAL/PLATELET
Basophils Absolute: 0 10*3/uL (ref 0.0–0.2)
Basos: 0 %
EOS (ABSOLUTE): 0.1 10*3/uL (ref 0.0–0.4)
Eos: 3 %
Hematocrit: 41.8 % (ref 34.0–46.6)
Hemoglobin: 12.6 g/dL (ref 11.1–15.9)
Immature Grans (Abs): 0 10*3/uL (ref 0.0–0.1)
Immature Granulocytes: 0 %
Lymphocytes Absolute: 2.1 10*3/uL (ref 0.7–3.1)
Lymphs: 42 %
MCH: 23.9 pg — ABNORMAL LOW (ref 26.6–33.0)
MCHC: 30.1 g/dL — ABNORMAL LOW (ref 31.5–35.7)
MCV: 79 fL (ref 79–97)
Monocytes Absolute: 0.4 10*3/uL (ref 0.1–0.9)
Monocytes: 8 %
Neutrophils Absolute: 2.4 10*3/uL (ref 1.4–7.0)
Neutrophils: 47 %
Platelets: 241 10*3/uL (ref 150–450)
RBC: 5.27 x10E6/uL (ref 3.77–5.28)
RDW: 15.2 % (ref 11.7–15.4)
WBC: 5.1 10*3/uL (ref 3.4–10.8)

## 2023-05-06 LAB — IRON,TIBC AND FERRITIN PANEL
Ferritin: 167 ng/mL — ABNORMAL HIGH (ref 15–150)
Iron Saturation: 27 % (ref 15–55)
Iron: 72 ug/dL (ref 27–139)
Total Iron Binding Capacity: 271 ug/dL (ref 250–450)
UIBC: 199 ug/dL (ref 118–369)

## 2023-05-11 ENCOUNTER — Encounter: Payer: Self-pay | Admitting: Cardiovascular Disease

## 2023-05-11 ENCOUNTER — Ambulatory Visit (INDEPENDENT_AMBULATORY_CARE_PROVIDER_SITE_OTHER): Payer: 59 | Admitting: Cardiovascular Disease

## 2023-05-11 VITALS — BP 110/68 | HR 85 | Ht 64.0 in | Wt 150.0 lb

## 2023-05-11 DIAGNOSIS — I152 Hypertension secondary to endocrine disorders: Secondary | ICD-10-CM

## 2023-05-11 DIAGNOSIS — E119 Type 2 diabetes mellitus without complications: Secondary | ICD-10-CM

## 2023-05-11 DIAGNOSIS — R0789 Other chest pain: Secondary | ICD-10-CM | POA: Diagnosis not present

## 2023-05-11 DIAGNOSIS — E1159 Type 2 diabetes mellitus with other circulatory complications: Secondary | ICD-10-CM | POA: Diagnosis not present

## 2023-05-11 DIAGNOSIS — E782 Mixed hyperlipidemia: Secondary | ICD-10-CM

## 2023-05-11 NOTE — Progress Notes (Signed)
Cardiology Office Note   Date:  05/11/2023   ID:  Neeta Storey, DOB Oct 30, 1940, MRN [id number]  PCP:  [name], MD  Cardiologist:  Adrian Blackwater, MD      History of Present Illness: Helen Mccormick is a 83 y.o. female who presents for  Chief Complaint  Patient presents with   Follow-up    Follow up     Has pain in whole body.      Past Medical History:  Diagnosis Date   Allergy    Anxiety    CHF (congestive heart failure) (HCC)    Colon polyp    Depression    GERD (gastroesophageal reflux disease)    Heart murmur    Hyperlipidemia    Hypertension    Hypothyroidism    Sleep apnea    Thyroid disease      Past Surgical History:  Procedure Laterality Date   ABDOMINAL HYSTERECTOMY     BACK SURGERY     BACK SURGERY     CERVICAL FUSION     COLONOSCOPY WITH PROPOFOL N/A 05/28/2017   Procedure: COLONOSCOPY WITH PROPOFOL;  Surgeon: Wyline Mood, MD;  Location: [facility] ENDOSCOPY;  Service: Gastroenterology;  Laterality: N/A;   COLONOSCOPY WITH PROPOFOL N/A 11/17/2019   Procedure: COLONOSCOPY WITH PROPOFOL;  Surgeon: Regis Bill, MD;  Location: [facility] ENDOSCOPY;  Service: Endoscopy;  Laterality: N/A;   COLONOSCOPY WITH PROPOFOL N/A 04/20/2023   Procedure: COLONOSCOPY WITH PROPOFOL;  Surgeon: Wyline Mood, MD;  Location: [facility] ENDOSCOPY;  Service: Gastroenterology;  Laterality: N/A;   ESOPHAGOGASTRODUODENOSCOPY (EGD) WITH PROPOFOL N/A 11/17/2019   Procedure: ESOPHAGOGASTRODUODENOSCOPY (EGD) WITH PROPOFOL;  Surgeon: Regis Bill, MD;  Location: [facility] ENDOSCOPY;  Service: Endoscopy;  Laterality: N/A;   ESOPHAGOGASTRODUODENOSCOPY (EGD) WITH PROPOFOL N/A 04/20/2023   Procedure: ESOPHAGOGASTRODUODENOSCOPY (EGD) WITH PROPOFOL;  Surgeon: Wyline Mood, MD;  Location: [facility] ENDOSCOPY;  Service: Gastroenterology;  Laterality: N/A;   IR KYPHO THORACIC WITH BONE BIOPSY  10/20/2022   IR RADIOLOGIST EVAL & MGMT  09/22/2022   NECK SURGERY     POLYPECTOMY  04/20/2023    Procedure: POLYPECTOMY;  Surgeon: Wyline Mood, MD;  Location: [facility] ENDOSCOPY;  Service: Gastroenterology;;   THYROID SURGERY     THYROID SURGERY       Current Outpatient Medications  Medication Sig Dispense Refill   Accu-Chek Softclix Lancets lancets Use as instructed 100 each 12   albuterol (VENTOLIN HFA) 108 (90 Base) MCG/ACT inhaler Inhale 2 puffs by mouth every 6 hours as needed for wheezing/shortness of breath 8.5 each 11   ARNUITY ELLIPTA 100 MCG/ACT AEPB Inhale 1 puff by mouth every day 30 each 11   Azelastine HCl 137 MCG/SPRAY SOLN Use 1 spray into each nostril every day as directed 11.1 mL 11   Blood Glucose Monitoring Suppl (ACCU-CHEK GUIDE) w/Device KIT See admin instructions.     busPIRone (BUSPAR) 15 MG tablet Take 15 mg by mouth 2 (two) times daily.     carvedilol (COREG) 12.5 MG tablet Take 12.5 mg by mouth 2 (two) times daily.     empagliflozin (JARDIANCE) 25 MG TABS tablet Take 1 tablet (25 mg total) by mouth daily. 90 tablet 3   ENTRESTO 97-103 MG Take 1 tablet by mouth twice daily 60 tablet 11   fluticasone (FLOVENT HFA) 110 MCG/ACT inhaler Inhale 1 puff into the lungs 2 (two) times daily.     ibuprofen (ADVIL) 200 MG tablet Take 200 mg by mouth every 6 (six) hours as needed.  isosorbide dinitrate (ISORDIL) 30 MG tablet Take 1 tablet by mouth daily 30 tablet 11   levothyroxine (SYNTHROID) 75 MCG tablet Take 1 tablet (75 mcg total) by mouth daily. 90 tablet 3   meclizine (ANTIVERT) 12.5 MG tablet Take 1 tablet (12.5 mg total) by mouth 3 (three) times daily as needed for dizziness. 90 tablet 0   nitroGLYCERIN (NITROSTAT) 0.4 MG SL tablet Place 1 tablet (0.4 mg total) under the tongue every 5 (five) minutes as needed for chest pain. 50 tablet 3   ONETOUCH VERIO test strip USE TO CHECK BLOOD SUGAR TWICE DAILY 100 strip 6   pantoprazole (PROTONIX) 40 MG tablet Take one tablet by mouth daily 30 tablet 11   rosuvastatin (CRESTOR) 40 MG tablet Take 40 mg by mouth daily.      No current facility-administered medications for this visit.    Allergies:   Amlodipine, Tramadol, Isosorbide dinitrate, Codeine, and Etodolac    Social History:   reports that she has never smoked. She has quit using smokeless tobacco.  Her smokeless tobacco use included chew. She reports that she does not drink alcohol and does not use drugs.   Family History:  family history includes Breast cancer (age of onset: 90) in her maternal aunt; Cancer in her brother; Colon polyps in her sister; Diabetes in her mother, sister, and sister; Healthy in her daughter, daughter, sister, sister, son, and son; Heart Problems in her mother; Hypertension in her son; Leukemia in her mother; Mental illness in her sister; Stroke in her brother and father; Thyroid disease in her sister.    ROS:     Review of Systems  Constitutional: Negative.   HENT: Negative.    Eyes: Negative.   Respiratory: Negative.    Gastrointestinal: Negative.   Genitourinary: Negative.   Musculoskeletal: Negative.   Skin: Negative.   Neurological: Negative.   Endo/Heme/Allergies: Negative.   Psychiatric/Behavioral: Negative.    All other systems reviewed and are negative.     All other systems are reviewed and negative.    PHYSICAL EXAM: VS:  BP 110/68   Pulse 85   Ht 5\' 4"  (1.626 m)   Wt 150 lb (68 kg)   LMP  (LMP Unknown)   SpO2 97%   BMI 25.75 kg/m  , BMI Body mass index is 25.75 kg/m. Last weight:  Wt Readings from Last 3 Encounters:  05/11/23 150 lb (68 kg)  05/06/23 157 lb (71.2 kg)  05/04/23 157 lb 9.6 oz (71.5 kg)     Physical Exam Constitutional:      Appearance: Normal appearance.  Cardiovascular:     Rate and Rhythm: Normal rate and regular rhythm.     Heart sounds: Normal heart sounds.  Pulmonary:     Effort: Pulmonary effort is normal.     Breath sounds: Normal breath sounds.  Musculoskeletal:     Right lower leg: No edema.     Left lower leg: No edema.  Neurological:     Mental  Status: She is alert.       EKG:   Recent Labs: 07/01/2022: Magnesium 2.2 09/01/2022: TSH 0.314 02/04/2023: ALT 28; BUN 21; Creatinine, Ser 1.01; Potassium 3.8; Sodium 145 05/05/2023: Hemoglobin 12.6; Platelets 241    Lipid Panel    Component Value Date/Time   CHOL 146 03/08/2023 1157   TRIG 63 03/08/2023 1157   HDL 48 03/08/2023 1157   CHOLHDL 2.8 11/08/2018 0806   VLDL 23 08/16/2017 0528   LDLCALC 85 03/08/2023 1157  LDLCALC 60 11/08/2018 0806      Other studies Reviewed: Additional studies/ records that were reviewed today include:  Review of the above records demonstrates:      09/27/2019    8:55 AM  PAD Screen  Previous PAD dx? No  Previous surgical procedure? No  Pain with walking? No  Feet/toe relief with dangling? No  Painful, non-healing ulcers? No  Extremities discolored? Yes      ASSESSMENT AND PLAN:    ICD-10-CM   1. Chest pain, non-cardiac  R07.89    had normal coronaries in 2020    2. Mixed hyperlipidemia  E78.2     3. Hypertension associated with diabetes (HCC)  E11.59    I15.2     4. Type 2 diabetes mellitus without complication, without long-term current use of insulin (HCC)  E11.9        Problem List Items Addressed This Visit       Cardiovascular and Mediastinum   Hypertension associated with diabetes (HCC)     Endocrine   DM (diabetes mellitus), type 2 (HCC)     Other   Mixed hyperlipidemia   Other Visit Diagnoses       Chest pain, non-cardiac    -  Primary   had normal coronaries in 2020          Disposition:   Return in about 3 months (around 08/08/2023).    Total time spent: 30 minutes  Signed,  Adrian Blackwater, MD  05/11/2023 9:41 AM    [facility]

## 2023-05-15 ENCOUNTER — Other Ambulatory Visit: Payer: Self-pay | Admitting: Internal Medicine

## 2023-05-26 ENCOUNTER — Other Ambulatory Visit: Payer: Self-pay | Admitting: Cardiovascular Disease

## 2023-06-02 DIAGNOSIS — G8929 Other chronic pain: Secondary | ICD-10-CM | POA: Diagnosis not present

## 2023-06-02 DIAGNOSIS — M5412 Radiculopathy, cervical region: Secondary | ICD-10-CM | POA: Diagnosis not present

## 2023-06-02 DIAGNOSIS — M542 Cervicalgia: Secondary | ICD-10-CM | POA: Diagnosis not present

## 2023-06-02 DIAGNOSIS — M25511 Pain in right shoulder: Secondary | ICD-10-CM | POA: Diagnosis not present

## 2023-06-03 ENCOUNTER — Other Ambulatory Visit: Payer: Self-pay | Admitting: Internal Medicine

## 2023-06-03 DIAGNOSIS — R7989 Other specified abnormal findings of blood chemistry: Secondary | ICD-10-CM

## 2023-06-05 ENCOUNTER — Other Ambulatory Visit: Payer: Self-pay | Admitting: Internal Medicine

## 2023-06-07 ENCOUNTER — Ambulatory Visit: Payer: 59 | Admitting: Internal Medicine

## 2023-06-14 ENCOUNTER — Encounter: Payer: Self-pay | Admitting: Internal Medicine

## 2023-06-15 ENCOUNTER — Encounter: Payer: Self-pay | Admitting: Internal Medicine

## 2023-06-15 ENCOUNTER — Ambulatory Visit: Admitting: Internal Medicine

## 2023-06-15 VITALS — BP 118/60 | HR 85 | Ht 64.0 in | Wt 154.6 lb

## 2023-06-15 DIAGNOSIS — R413 Other amnesia: Secondary | ICD-10-CM | POA: Diagnosis not present

## 2023-06-15 DIAGNOSIS — D500 Iron deficiency anemia secondary to blood loss (chronic): Secondary | ICD-10-CM

## 2023-06-15 DIAGNOSIS — I152 Hypertension secondary to endocrine disorders: Secondary | ICD-10-CM

## 2023-06-15 DIAGNOSIS — E559 Vitamin D deficiency, unspecified: Secondary | ICD-10-CM | POA: Diagnosis not present

## 2023-06-15 DIAGNOSIS — E1159 Type 2 diabetes mellitus with other circulatory complications: Secondary | ICD-10-CM

## 2023-06-15 DIAGNOSIS — R748 Abnormal levels of other serum enzymes: Secondary | ICD-10-CM | POA: Diagnosis not present

## 2023-06-15 DIAGNOSIS — E213 Hyperparathyroidism, unspecified: Secondary | ICD-10-CM

## 2023-06-15 DIAGNOSIS — E785 Hyperlipidemia, unspecified: Secondary | ICD-10-CM | POA: Diagnosis not present

## 2023-06-15 DIAGNOSIS — E1169 Type 2 diabetes mellitus with other specified complication: Secondary | ICD-10-CM

## 2023-06-15 DIAGNOSIS — E782 Mixed hyperlipidemia: Secondary | ICD-10-CM

## 2023-06-15 DIAGNOSIS — E890 Postprocedural hypothyroidism: Secondary | ICD-10-CM

## 2023-06-15 DIAGNOSIS — J069 Acute upper respiratory infection, unspecified: Secondary | ICD-10-CM | POA: Diagnosis not present

## 2023-06-15 DIAGNOSIS — E119 Type 2 diabetes mellitus without complications: Secondary | ICD-10-CM

## 2023-06-15 LAB — POC INFLUENZA A&B (BINAX/QUICKVUE)
Influenza A, POC: NEGATIVE
Influenza B, POC: NEGATIVE

## 2023-06-15 NOTE — Progress Notes (Signed)
 Established Patient Office Visit  Subjective:  Patient ID: Helen Mccormick, female    DOB: Mar 03, 1941  Age: 83 y.o. MRN: [id number]  Chief Complaint  Patient presents with   Follow-up    3 month follow up    Patient comes in for her follow-up today.  She reports of cold like symptoms for almost 5 days now.  No fevers or chills but has a runny nose and mild cough.  Her flu test is negative in the office.  Her last labs had shown elevated alk phos.  Her GGT was normal.  Will repeat today.  Her PTH was slightly elevated, with normal calcium and vitamin D.  Will set up endocrine consult. Patient concerned about memory issues.  Her CFS test done today is abnormal.  She does have a family history of dementia.  Will schedule a neurology consult.    No other concerns at this time.   Past Medical History:  Diagnosis Date   Allergy    Anxiety    CHF (congestive heart failure) (HCC)    Colon polyp    Depression    GERD (gastroesophageal reflux disease)    Heart murmur    Hyperlipidemia    Hypertension    Hypothyroidism    Sleep apnea    Thyroid disease     Past Surgical History:  Procedure Laterality Date   ABDOMINAL HYSTERECTOMY     BACK SURGERY     BACK SURGERY     CERVICAL FUSION     COLONOSCOPY WITH PROPOFOL N/A 05/28/2017   Procedure: COLONOSCOPY WITH PROPOFOL;  Surgeon: Wyline Mood, MD;  Location: [facility] ENDOSCOPY;  Service: Gastroenterology;  Laterality: N/A;   COLONOSCOPY WITH PROPOFOL N/A 11/17/2019   Procedure: COLONOSCOPY WITH PROPOFOL;  Surgeon: Regis Bill, MD;  Location: [facility] ENDOSCOPY;  Service: Endoscopy;  Laterality: N/A;   COLONOSCOPY WITH PROPOFOL N/A 04/20/2023   Procedure: COLONOSCOPY WITH PROPOFOL;  Surgeon: Wyline Mood, MD;  Location: [facility] ENDOSCOPY;  Service: Gastroenterology;  Laterality: N/A;   ESOPHAGOGASTRODUODENOSCOPY (EGD) WITH PROPOFOL N/A 11/17/2019   Procedure: ESOPHAGOGASTRODUODENOSCOPY (EGD) WITH PROPOFOL;  Surgeon: Regis Bill,  MD;  Location: [facility] ENDOSCOPY;  Service: Endoscopy;  Laterality: N/A;   ESOPHAGOGASTRODUODENOSCOPY (EGD) WITH PROPOFOL N/A 04/20/2023   Procedure: ESOPHAGOGASTRODUODENOSCOPY (EGD) WITH PROPOFOL;  Surgeon: Wyline Mood, MD;  Location: [facility] ENDOSCOPY;  Service: Gastroenterology;  Laterality: N/A;   IR KYPHO THORACIC WITH BONE BIOPSY  10/20/2022   IR RADIOLOGIST EVAL & MGMT  09/22/2022   NECK SURGERY     POLYPECTOMY  04/20/2023   Procedure: POLYPECTOMY;  Surgeon: Wyline Mood, MD;  Location: [facility] ENDOSCOPY;  Service: Gastroenterology;;   THYROID SURGERY     THYROID SURGERY      Social History   Socioeconomic History   Marital status: Single    Spouse name: Not on file   Number of children: Not on file   Years of education: Not on file   Highest education level: 10th grade  Occupational History   Occupation: retired  Tobacco Use   Smoking status: Never   Smokeless tobacco: Former    Types: Engineer, drilling   Vaping status: Never Used  Substance and Sexual Activity   Alcohol use: No   Drug use: No   Sexual activity: Not Currently  Other Topics Concern   Not on file  Social History Narrative   Not on file   Social Drivers of Health   Financial Resource Strain: Low Risk  (04/27/2017)  Overall Financial Resource Strain (CARDIA)    Difficulty of Paying Living Expenses: Not hard at all  Food Insecurity: No Food Insecurity (04/27/2017)   Hunger Vital Sign    Worried About Running Out of Food in the Last Year: Never true    Ran Out of Food in the Last Year: Never true  Transportation Needs: No Transportation Needs (04/27/2017)   PRAPARE - Administrator, Civil Service (Medical): No    Lack of Transportation (Non-Medical): No  Physical Activity: Sufficiently Active (12/20/2018)   Exercise Vital Sign    Days of Exercise per Week: 4 days    Minutes of Exercise per Session: 60 min  Stress: No Stress Concern Present (03/12/2017)   [employer] of Occupational Health -  Occupational Stress Questionnaire    Feeling of Stress : Only a little  Social Connections: Moderately Integrated (04/27/2017)   Social Connection and Isolation Panel [NHANES]    Frequency of Communication with Friends and Family: More than three times a week    Frequency of Social Gatherings with Friends and Family: Once a week    Attends Religious Services: More than 4 times per year    Active Member of [employer] or Organizations: Yes    Attends Banker Meetings: More than 4 times per year    Marital Status: Never married  Intimate Partner Violence: Unknown (03/12/2017)   Humiliation, Afraid, Rape, and Kick questionnaire    Fear of Current or Ex-Partner: Patient declined    Emotionally Abused: Patient declined    Physically Abused: Patient declined    Sexually Abused: Patient declined    Family History  Problem Relation Age of Onset   Breast cancer Maternal Aunt 2   Diabetes Mother    Leukemia Mother    Heart Problems Mother    Diabetes Sister    Mental illness Sister    Colon polyps Sister    Stroke Father    Cancer Brother        rectal   Healthy Daughter    Hypertension Son    Diabetes Sister    Thyroid disease Sister    Healthy Sister    Healthy Sister    Stroke Brother    Healthy Son    Healthy Son    Healthy Daughter     Allergies  Allergen Reactions   Amlodipine Hives and Swelling   Tramadol Other (See Comments)    Dizziness    Isosorbide Dinitrate     Other Reaction(s): Headache   Codeine Nausea Only   Etodolac Other (See Comments)    Other reaction(s): Abdominal Pain    Outpatient Medications Prior to Visit  Medication Sig   Accu-Chek Softclix Lancets lancets Use as instructed   albuterol (VENTOLIN HFA) 108 (90 Base) MCG/ACT inhaler Inhale 2 puffs by mouth every 6 hours as needed for wheezing/shortness of breath   ARNUITY ELLIPTA 100 MCG/ACT AEPB Inhale 1 puff by mouth every day   Blood Glucose Monitoring Suppl (ACCU-CHEK GUIDE) w/Device  KIT See admin instructions.   busPIRone (BUSPAR) 15 MG tablet Take 15 mg by mouth 2 (two) times daily.   carvedilol (COREG) 12.5 MG tablet Take 1 tablet by mouth twice daily   ENTRESTO 97-103 MG Take 1 tablet by mouth twice daily   escitalopram (LEXAPRO) 10 MG tablet Take 1 tablet by mouth every day   fluticasone (FLOVENT HFA) 110 MCG/ACT inhaler Inhale 1 puff into the lungs 2 (two) times daily.   furosemide (LASIX) 20  MG tablet Take 20 mg by mouth daily.   ibuprofen (ADVIL) 200 MG tablet Take 200 mg by mouth every 6 (six) hours as needed.   isosorbide dinitrate (ISORDIL) 30 MG tablet Take 1 tablet by mouth every day   JARDIANCE 25 MG TABS tablet Take 1 tablet by mouth every day   levothyroxine (SYNTHROID) 75 MCG tablet Take 1 tablet (75 mcg total) by mouth daily.   meclizine (ANTIVERT) 12.5 MG tablet Take 1 tablet (12.5 mg total) by mouth 3 (three) times daily as needed for dizziness.   ONETOUCH VERIO test strip USE TO CHECK BLOOD SUGAR TWICE DAILY   pantoprazole (PROTONIX) 40 MG tablet Take one tablet by mouth daily   rosuvastatin (CRESTOR) 40 MG tablet Take 40 mg by mouth daily.   Azelastine HCl 137 MCG/SPRAY SOLN Use 1 spray into each nostril every day as directed   nitroGLYCERIN (NITROSTAT) 0.4 MG SL tablet Place 1 tablet (0.4 mg total) under the tongue every 5 (five) minutes as needed for chest pain. (Patient not taking: Reported on 06/15/2023)   No facility-administered medications prior to visit.    Review of Systems  Constitutional:  Positive for malaise/fatigue. Negative for chills, diaphoresis, fever and weight loss.  HENT:  Positive for congestion and sore throat.   Eyes: Negative.  Negative for blurred vision and double vision.  Respiratory: Negative.  Negative for cough and shortness of breath.   Cardiovascular: Negative.  Negative for chest pain, palpitations and leg swelling.  Gastrointestinal: Negative.  Negative for abdominal pain, constipation, diarrhea, heartburn, nausea  and vomiting.  Genitourinary: Negative.  Negative for dysuria and flank pain.  Musculoskeletal: Negative.  Negative for joint pain and myalgias.  Skin: Negative.   Neurological: Negative.  Negative for dizziness, tingling, tremors, sensory change and headaches.  Endo/Heme/Allergies: Negative.   Psychiatric/Behavioral: Negative.  Negative for depression and suicidal ideas. The patient is not nervous/anxious.        Objective:   BP 118/60   Pulse 85   Ht 5\' 4"  (1.626 m)   Wt 154 lb 9.6 oz (70.1 kg)   LMP  (LMP Unknown)   SpO2 96%   BMI 26.54 kg/m   Vitals:   06/15/23 1058  BP: 118/60  Pulse: 85  Height: 5\' 4"  (1.626 m)  Weight: 154 lb 9.6 oz (70.1 kg)  SpO2: 96%  BMI (Calculated): 26.52    Physical Exam Vitals and nursing note reviewed.  Constitutional:      Appearance: Normal appearance.  HENT:     Head: Normocephalic and atraumatic.     Nose: Nose normal.     Mouth/Throat:     Mouth: Mucous membranes are moist.     Pharynx: Oropharynx is clear.  Eyes:     Conjunctiva/sclera: Conjunctivae normal.     Pupils: Pupils are equal, round, and reactive to light.  Cardiovascular:     Rate and Rhythm: Normal rate and regular rhythm.     Pulses: Normal pulses.     Heart sounds: Normal heart sounds. No murmur heard. Pulmonary:     Effort: Pulmonary effort is normal.     Breath sounds: Normal breath sounds. No wheezing.  Abdominal:     General: Bowel sounds are normal.     Palpations: Abdomen is soft.     Tenderness: There is no abdominal tenderness. There is no right CVA tenderness or left CVA tenderness.  Musculoskeletal:        General: Normal range of motion.     Cervical  back: Normal range of motion.     Right lower leg: No edema.     Left lower leg: No edema.  Skin:    General: Skin is warm and dry.  Neurological:     General: No focal deficit present.     Mental Status: She is alert and oriented to person, place, and time.  Psychiatric:        Mood and  Affect: Mood normal.        Behavior: Behavior normal.      Results for orders placed or performed in visit on 06/15/23  POC Influenza A&B (Binax test)  Result Value Ref Range   Influenza A, POC Negative Negative   Influenza B, POC Negative Negative    Recent Results (from the past 2160 hours)  Gamma GT     Status: None   Collection Time: 03/30/23  2:24 PM  Result Value Ref Range   GGT 31 0 - 60 IU/L  PTH, Intact and Calcium     Status: Abnormal   Collection Time: 03/30/23  2:24 PM  Result Value Ref Range   Calcium 9.4 8.7 - 10.3 mg/dL   PTH 78 (H) 15 - 65 pg/mL   PTH Interp Comment     Comment: Interpretation                 Intact PTH    Calcium                                 (pg/mL)      (mg/dL) Normal                          15 - 65     8.6 - 10.2 Primary Hyperparathyroidism         >65          >10.2 Secondary Hyperparathyroidism       >65          <10.2 Non-Parathyroid Hypercalcemia       <65          >10.2 Hypoparathyroidism                  <15          < 8.6 Non-Parathyroid Hypocalcemia    15 - 65          < 8.6   Vitamin D (25 hydroxy)     Status: None   Collection Time: 03/30/23  2:24 PM  Result Value Ref Range   Vit D, 25-Hydroxy 33.5 30.0 - 100.0 ng/mL    Comment: Vitamin D deficiency has been defined by the Institute of Medicine and an Endocrine Society practice guideline as a level of serum 25-OH vitamin D less than 20 ng/mL (1,2). The Endocrine Society went on to further define vitamin D insufficiency as a level between 21 and 29 ng/mL (2). 1. IOM (Institute of Medicine). 2010. Dietary reference    intakes for calcium and D. [place]: The    [employer]. 2. Holick MF, [place], Bischoff-Ferrari HA, et al.    Evaluation, treatment, and prevention of vitamin D    deficiency: an Endocrine Society clinical practice    guideline. JCEM. 2011 Jul; 96(7):1911-30.   Surgical pathology     Status: None   Collection Time: 04/20/23 12:00 AM   Result Value Ref Range   SURGICAL PATHOLOGY  SURGICAL PATHOLOGY [facility] [address] [place], [place] [address] Telephone [phone] or [phone] Fax [phone]  REPORT OF SURGICAL PATHOLOGY   Accession #: [id number] Patient Name: LUANN, ASPINWALL Visit # : [id number]  MRN: [id number] Physician: Wyline Mood DOB/Age October 30, 1940 (Age: 81) Gender: F Collected Date: 04/20/2023 Received Date: 04/20/2023  FINAL DIAGNOSIS       1. Transverse Colon Polyp, Cold snare :       - TUBULAR ADENOMA.      - NEGATIVE FOR HIGH-GRADE DYSPLASIA AND MALIGNANCY.       DATE SIGNED OUT: 04/21/2023 ELECTRONIC SIGNATURE : Oneita Kras Md, Delice Bison , Pathologist, Electronic Signature  MICROSCOPIC DESCRIPTION  CASE COMMENTS STAINS USED IN DIAGNOSIS: H&E    CLINICAL HISTORY  SPECIMEN(S) OBTAINED 1. Transverse Colon Polyp, Cold Snare  SPECIMEN COMMENTS: SPECIMEN CLINICAL INFORMATION: 1. Normal upper exam, colon polyps, diverticulosis    Gross Description 1. Received in formalin i s a 0.3 x 0.2 x 0.2 cm tan polypoid tissue fragment submitted entirely in block 1A.   (SB:kh 04/20/23)        Report signed out from the following location(s) [place] [address], [place] [address] CLIA #: [id number]  [facility] [address] [place], [place] [address] CLIA #: [id number]   Glucose, capillary     Status: None   Collection Time: 04/20/23  9:20 AM  Result Value Ref Range   Glucose-Capillary 95 70 - 99 mg/dL    Comment: Glucose reference range applies only to samples taken after fasting for at least 8 hours.  CBC with Differential/Platelet     Status: Abnormal   Collection Time: 05/05/23  9:59 AM  Result Value Ref Range   WBC 5.1 3.4 - 10.8 x10E3/uL   RBC 5.27 3.77 - 5.28 x10E6/uL   Hemoglobin 12.6 11.1 - 15.9 g/dL   Hematocrit 94.8 54.6 - 46.6 %   MCV 79 79 - 97 fL   MCH 23.9 (L) 26.6 -  33.0 pg   MCHC 30.1 (L) 31.5 - 35.7 g/dL   RDW 27.0 35.0 - 09.3 %   Platelets 241 150 - 450 x10E3/uL   Neutrophils 47 Not Estab. %   Lymphs 42 Not Estab. %   Monocytes 8 Not Estab. %   Eos 3 Not Estab. %   Basos 0 Not Estab. %   Neutrophils Absolute 2.4 1.4 - 7.0 x10E3/uL   Lymphocytes Absolute 2.1 0.7 - 3.1 x10E3/uL   Monocytes Absolute 0.4 0.1 - 0.9 x10E3/uL   EOS (ABSOLUTE) 0.1 0.0 - 0.4 x10E3/uL   Basophils Absolute 0.0 0.0 - 0.2 x10E3/uL   Immature Granulocytes 0 Not Estab. %   Immature Grans (Abs) 0.0 0.0 - 0.1 x10E3/uL  Iron, TIBC and Ferritin Panel     Status: Abnormal   Collection Time: 05/05/23  9:59 AM  Result Value Ref Range   Total Iron Binding Capacity 271 250 - 450 ug/dL   UIBC 818 299 - 371 ug/dL   Iron 72 27 - 696 ug/dL   Iron Saturation 27 15 - 55 %   Ferritin 167 (H) 15 - 150 ng/mL  POC Influenza A&B (Binax test)     Status: None   Collection Time: 06/15/23 11:53 AM  Result Value Ref Range   Influenza A, POC Negative Negative   Influenza B, POC Negative Negative      Assessment & Plan:  Continue current medications.  Check blood work.  Set up neurology and endocrine consults. Problem List Items Addressed This Visit     Hypertension associated with diabetes (HCC) - Primary   Relevant Medications   furosemide (LASIX) 20 MG tablet   Other Relevant Orders   CMP14+EGFR   DM (diabetes mellitus), type 2 (HCC)   Hypothyroidism, postsurgical   Relevant Orders   TSH   Hyperlipidemia due to type 2 diabetes mellitus (HCC)   Relevant Medications   furosemide (LASIX) 20 MG tablet   Other Relevant Orders   Lipid Panel w/o Chol/HDL Ratio   Mixed hyperlipidemia   Relevant Medications   furosemide (LASIX) 20 MG tablet   Iron deficiency anemia due to chronic blood loss   Relevant Orders   CBC with Diff   Other Visit Diagnoses       Elevated liver enzymes       Relevant Orders   Sed Rate (ESR)     Vitamin D deficiency       Relevant Orders   Vitamin D  (25 hydroxy)     Gradual-onset memory impairment       Relevant Orders   Vitamin B12   Ambulatory referral to Neurology     Acute upper respiratory infection       Relevant Orders   POC Influenza A&B (Binax test) (Completed)       Return in about 1 month (around 07/16/2023).   Total time spent: 30 minutes  [name], MD  06/15/2023   This document may have been prepared by [facility] Voice Recognition software and as such may include unintentional dictation errors.

## 2023-06-15 NOTE — Progress Notes (Signed)
 Patient notified

## 2023-06-16 DIAGNOSIS — M5412 Radiculopathy, cervical region: Secondary | ICD-10-CM | POA: Diagnosis not present

## 2023-06-16 DIAGNOSIS — M25511 Pain in right shoulder: Secondary | ICD-10-CM | POA: Diagnosis not present

## 2023-06-16 DIAGNOSIS — M542 Cervicalgia: Secondary | ICD-10-CM | POA: Diagnosis not present

## 2023-06-16 DIAGNOSIS — M5441 Lumbago with sciatica, right side: Secondary | ICD-10-CM | POA: Diagnosis not present

## 2023-06-16 DIAGNOSIS — G8929 Other chronic pain: Secondary | ICD-10-CM | POA: Diagnosis not present

## 2023-06-16 DIAGNOSIS — M5416 Radiculopathy, lumbar region: Secondary | ICD-10-CM | POA: Diagnosis not present

## 2023-06-16 LAB — CMP14+EGFR
ALT: 23 IU/L (ref 0–32)
AST: 22 IU/L (ref 0–40)
Albumin: 4.1 g/dL (ref 3.8–4.8)
Alkaline Phosphatase: 131 IU/L — ABNORMAL HIGH (ref 44–121)
BUN/Creatinine Ratio: 19 (ref 12–28)
BUN: 18 mg/dL (ref 8–27)
Bilirubin Total: 0.4 mg/dL (ref 0.0–1.2)
CO2: 24 mmol/L (ref 20–29)
Calcium: 9 mg/dL (ref 8.7–10.3)
Chloride: 104 mmol/L (ref 96–106)
Creatinine, Ser: 0.93 mg/dL (ref 0.57–1.00)
Globulin, Total: 2.5 g/dL (ref 1.5–4.5)
Glucose: 101 mg/dL — ABNORMAL HIGH (ref 70–99)
Potassium: 4.1 mmol/L (ref 3.5–5.2)
Sodium: 143 mmol/L (ref 134–144)
Total Protein: 6.6 g/dL (ref 6.0–8.5)
eGFR: 62 mL/min/{1.73_m2} (ref >59)

## 2023-06-16 LAB — CBC WITH DIFFERENTIAL/PLATELET
Basophils Absolute: 0 10*3/uL (ref 0.0–0.2)
Basos: 0 %
EOS (ABSOLUTE): 0.2 10*3/uL (ref 0.0–0.4)
Eos: 3 %
Hematocrit: 37.2 % (ref 34.0–46.6)
Hemoglobin: 11.2 g/dL (ref 11.1–15.9)
Immature Grans (Abs): 0 10*3/uL (ref 0.0–0.1)
Immature Granulocytes: 0 %
Lymphocytes Absolute: 1.8 10*3/uL (ref 0.7–3.1)
Lymphs: 32 %
MCH: 23.9 pg — ABNORMAL LOW (ref 26.6–33.0)
MCHC: 30.1 g/dL — ABNORMAL LOW (ref 31.5–35.7)
MCV: 80 fL (ref 79–97)
Monocytes Absolute: 0.6 10*3/uL (ref 0.1–0.9)
Monocytes: 10 %
Neutrophils Absolute: 3.2 10*3/uL (ref 1.4–7.0)
Neutrophils: 55 %
Platelets: 181 10*3/uL (ref 150–450)
RBC: 4.68 x10E6/uL (ref 3.77–5.28)
RDW: 15.8 % — ABNORMAL HIGH (ref 11.7–15.4)
WBC: 5.8 10*3/uL (ref 3.4–10.8)

## 2023-06-16 LAB — LIPID PANEL W/O CHOL/HDL RATIO
Cholesterol, Total: 129 mg/dL (ref 100–199)
HDL: 42 mg/dL (ref >39)
LDL Chol Calc (NIH): 70 mg/dL (ref 0–99)
Triglycerides: 90 mg/dL (ref 0–149)
VLDL Cholesterol Cal: 17 mg/dL (ref 5–40)

## 2023-06-16 LAB — VITAMIN B12: Vitamin B-12: 521 pg/mL (ref 232–1245)

## 2023-06-16 LAB — TSH: TSH: 0.931 u[IU]/mL (ref 0.450–4.500)

## 2023-06-16 LAB — SEDIMENTATION RATE: Sed Rate: 28 mm/h (ref 0–40)

## 2023-06-16 LAB — VITAMIN D 25 HYDROXY (VIT D DEFICIENCY, FRACTURES): Vit D, 25-Hydroxy: 28 ng/mL — ABNORMAL LOW (ref 30.0–100.0)

## 2023-06-25 ENCOUNTER — Other Ambulatory Visit: Payer: Self-pay | Admitting: Internal Medicine

## 2023-06-29 ENCOUNTER — Ambulatory Visit: Admitting: Podiatry

## 2023-07-05 ENCOUNTER — Other Ambulatory Visit: Payer: Self-pay | Admitting: Internal Medicine

## 2023-07-05 DIAGNOSIS — Z1231 Encounter for screening mammogram for malignant neoplasm of breast: Secondary | ICD-10-CM

## 2023-07-06 ENCOUNTER — Ambulatory Visit (INDEPENDENT_AMBULATORY_CARE_PROVIDER_SITE_OTHER): Admitting: Podiatry

## 2023-07-06 DIAGNOSIS — B351 Tinea unguium: Secondary | ICD-10-CM

## 2023-07-06 DIAGNOSIS — M79674 Pain in right toe(s): Secondary | ICD-10-CM

## 2023-07-06 DIAGNOSIS — M79675 Pain in left toe(s): Secondary | ICD-10-CM | POA: Diagnosis not present

## 2023-07-06 NOTE — Progress Notes (Signed)
  Subjective:  Patient ID: Helen Mccormick, female    DOB: Dec 18, 1940,  MRN: [id number]  Chief Complaint  Patient presents with   Nail Problem    Nail trim   83 y.o. female returns for the above complaint.  Patient presents with elongated dystrophic mycotic toenails x 10 mild pain on palpation hurts with ambulation is with pressure patient would like for her to be debrided down she is not able to resolve denies any other acute complaints  Objective:  There were no vitals filed for this visit. Podiatric Exam: Vascular: dorsalis pedis and posterior tibial pulses are palpable bilateral. Capillary return is immediate. Temperature gradient is WNL. Skin turgor WNL  Sensorium: Normal Semmes Weinstein monofilament test. Normal tactile sensation bilaterally. Nail Exam: Pt has thick disfigured discolored nails with subungual debris noted bilateral entire nail hallux through fifth toenails.  Pain on palpation to the nails. Ulcer Exam: There is no evidence of ulcer or pre-ulcerative changes or infection. Orthopedic Exam: Muscle tone and strength are WNL. No limitations in general ROM. No crepitus or effusions noted.  Skin: No Porokeratosis. No infection or ulcers    Assessment & Plan:  No diagnosis found.  Patient was evaluated and treated and all questions answered.  Onychomycosis with pain  -Nails palliatively debrided as below. -Educated on self-care  Procedure: Nail Debridement Rationale: pain  Type of Debridement: manual, sharp debridement. Instrumentation: Nail nipper, rotary burr. Number of Nails: 10  Procedures and Treatment: Consent by patient was obtained for treatment procedures. The patient understood the discussion of treatment and procedures well. All questions were answered thoroughly reviewed. Debridement of mycotic and hypertrophic toenails, 1 through 5 bilateral and clearing of subungual debris. No ulceration, no infection noted.  Return Visit-Office Procedure: Patient  instructed to return to the office for a follow up visit 3 months for continued evaluation and treatment.  Nicholes Rough, DPM    No follow-ups on file.

## 2023-07-12 ENCOUNTER — Telehealth: Payer: Self-pay | Admitting: Internal Medicine

## 2023-07-12 NOTE — Telephone Encounter (Signed)
 Patient left VM with her name, DOB, and phone number - but did not state what she needs.

## 2023-07-19 ENCOUNTER — Ambulatory Visit: Admitting: Internal Medicine

## 2023-07-25 ENCOUNTER — Emergency Department

## 2023-07-25 ENCOUNTER — Other Ambulatory Visit: Payer: Self-pay

## 2023-07-25 ENCOUNTER — Emergency Department
Admission: EM | Admit: 2023-07-25 | Discharge: 2023-07-25 | Disposition: A | Discharge status: Home / Self Care | Attending: Emergency Medicine | Admitting: Emergency Medicine

## 2023-07-25 DIAGNOSIS — I2510 Atherosclerotic heart disease of native coronary artery without angina pectoris: Secondary | ICD-10-CM | POA: Insufficient documentation

## 2023-07-25 DIAGNOSIS — R404 Transient alteration of awareness: Secondary | ICD-10-CM | POA: Diagnosis not present

## 2023-07-25 DIAGNOSIS — I509 Heart failure, unspecified: Secondary | ICD-10-CM | POA: Diagnosis not present

## 2023-07-25 DIAGNOSIS — I6782 Cerebral ischemia: Secondary | ICD-10-CM | POA: Diagnosis not present

## 2023-07-25 DIAGNOSIS — I110 Hypertensive heart disease with heart failure: Secondary | ICD-10-CM | POA: Diagnosis not present

## 2023-07-25 DIAGNOSIS — E119 Type 2 diabetes mellitus without complications: Secondary | ICD-10-CM | POA: Insufficient documentation

## 2023-07-25 DIAGNOSIS — I447 Left bundle-branch block, unspecified: Secondary | ICD-10-CM | POA: Diagnosis not present

## 2023-07-25 DIAGNOSIS — J45909 Unspecified asthma, uncomplicated: Secondary | ICD-10-CM | POA: Diagnosis not present

## 2023-07-25 DIAGNOSIS — R42 Dizziness and giddiness: Secondary | ICD-10-CM | POA: Insufficient documentation

## 2023-07-25 DIAGNOSIS — R531 Weakness: Secondary | ICD-10-CM | POA: Diagnosis not present

## 2023-07-25 DIAGNOSIS — I499 Cardiac arrhythmia, unspecified: Secondary | ICD-10-CM | POA: Diagnosis not present

## 2023-07-25 DIAGNOSIS — R059 Cough, unspecified: Secondary | ICD-10-CM | POA: Diagnosis not present

## 2023-07-25 DIAGNOSIS — R6889 Other general symptoms and signs: Secondary | ICD-10-CM | POA: Diagnosis not present

## 2023-07-25 LAB — CBC WITH DIFFERENTIAL/PLATELET
Abs Immature Granulocytes: 0.01 10*3/uL (ref 0.00–0.07)
Basophils Absolute: 0 10*3/uL (ref 0.0–0.1)
Basophils Relative: 0 %
Eosinophils Absolute: 0.1 10*3/uL (ref 0.0–0.5)
Eosinophils Relative: 2 %
HCT: 37.8 % (ref 36.0–46.0)
Hemoglobin: 12.1 g/dL (ref 12.0–15.0)
Immature Granulocytes: 0 %
Lymphocytes Relative: 53 %
Lymphs Abs: 3.4 10*3/uL (ref 0.7–4.0)
MCH: 24.4 pg — ABNORMAL LOW (ref 26.0–34.0)
MCHC: 32 g/dL (ref 30.0–36.0)
MCV: 76.2 fL — ABNORMAL LOW (ref 80.0–100.0)
Monocytes Absolute: 0.4 10*3/uL (ref 0.1–1.0)
Monocytes Relative: 7 %
Neutro Abs: 2.4 10*3/uL (ref 1.7–7.7)
Neutrophils Relative %: 38 %
Platelets: 203 10*3/uL (ref 150–400)
RBC: 4.96 MIL/uL (ref 3.87–5.11)
RDW: 16.9 % — ABNORMAL HIGH (ref 11.5–15.5)
WBC: 6.3 10*3/uL (ref 4.0–10.5)
nRBC: 0 % (ref 0.0–0.2)

## 2023-07-25 LAB — COMPREHENSIVE METABOLIC PANEL WITH GFR
ALT: 15 U/L (ref 0–44)
AST: 22 U/L (ref 15–41)
Albumin: 4 g/dL (ref 3.5–5.0)
Alkaline Phosphatase: 81 U/L (ref 38–126)
Anion gap: 7 (ref 5–15)
BUN: 27 mg/dL — ABNORMAL HIGH (ref 8–23)
CO2: 26 mmol/L (ref 22–32)
Calcium: 8.9 mg/dL (ref 8.9–10.3)
Chloride: 109 mmol/L (ref 98–111)
Creatinine, Ser: 0.89 mg/dL (ref 0.44–1.00)
GFR, Estimated: >60 mL/min (ref >60)
Glucose, Bld: 96 mg/dL (ref 70–99)
Potassium: 3.7 mmol/L (ref 3.5–5.1)
Sodium: 142 mmol/L (ref 135–145)
Total Bilirubin: 0.6 mg/dL (ref 0.0–1.2)
Total Protein: 7.2 g/dL (ref 6.5–8.1)

## 2023-07-25 NOTE — ED Notes (Signed)
 Pt stated she needed to use the bathroom. Pt walked to the bathroom without difficulty or complaints of dizziness.

## 2023-07-25 NOTE — ED Provider Notes (Signed)
 [facility] Provider Note    Event Date/Time   First MD Initiated Contact with Patient 07/25/23 1557     (approximate)   History   Chief Complaint Dizziness   HPI  Helen Mccormick is a 83 y.o. female with past medical history of hypertension, hyperlipidemia, diabetes, CAD, CHF, and asthma who presents to the ED complaining of dizziness.  Patient reports that she woke up today from a nap on the couch feeling "dizzy and anxious."  Patient states that she felt unsteady on her feet, also felt lightheaded like she was going to pass out.  She denies any associated chest pain or shortness of breath, does state that she has been dealing with a cough for the past 2 weeks.  She denies any fevers, nausea, vomiting, diarrhea, or dysuria.  She now states she is feeling back to normal, denies any vision changes, speech changes, numbness, weakness in her extremities.     Physical Exam   Triage Vital Signs: ED Triage Vitals [07/25/23 1558]  Encounter Vitals Group     BP      Systolic BP Percentile      Diastolic BP Percentile      Pulse      Resp      Temp      Temp src      SpO2 98 %     Weight      Height      Head Circumference      Peak Flow      Pain Score      Pain Loc      Pain Education      Exclude from Growth Chart     Most recent vital signs: Vitals:   07/25/23 1558 07/25/23 1600  BP:  (!) 170/77  Pulse:  70  Resp:  18  Temp:  98.7 F (37.1 C)  SpO2: 98% 98%    Constitutional: Alert and oriented. Eyes: Conjunctivae are normal. Head: Atraumatic. Nose: No congestion/rhinnorhea. Mouth/Throat: Mucous membranes are moist.  Cardiovascular: Normal rate, regular rhythm. Grossly normal heart sounds.  2+ radial pulses bilaterally. Respiratory: Normal respiratory effort.  No retractions. Lungs CTAB. Gastrointestinal: Soft and nontender. No distention. Musculoskeletal: No lower extremity tenderness nor edema.  Neurologic:  Normal speech and  language. No gross focal neurologic deficits are appreciated.    ED Results / Procedures / Treatments   Labs (all labs ordered are listed, but only abnormal results are displayed) Labs Reviewed  CBC WITH DIFFERENTIAL/PLATELET - Abnormal; Notable for the following components:      Result Value   MCV 76.2 (*)    MCH 24.4 (*)    RDW 16.9 (*)    All other components within normal limits  COMPREHENSIVE METABOLIC PANEL WITH GFR - Abnormal; Notable for the following components:   BUN 27 (*)    All other components within normal limits     EKG  ED ECG REPORT I, Twilla Galea, the attending physician, personally viewed and interpreted this ECG.   Date: 07/25/2023  EKG Time: 16:29  Rate: 59  Rhythm: normal sinus rhythm  Axis: Normal  Intervals:left bundle branch block  ST&T Change: None  RADIOLOGY CT head reviewed and interpreted by me with no hemorrhage or midline shift.  PROCEDURES:  Critical Care performed: No  Procedures   MEDICATIONS ORDERED IN ED: Medications - No data to display   IMPRESSION / MDM / ASSESSMENT AND PLAN / ED COURSE  I reviewed the  triage vital signs and the nursing notes.                              83 y.o. female with past medical history of hypertension, hyperlipidemia, diabetes, CAD, CHF, and asthma who presents to the ED complaining of feeling dizzy and lightheaded with unsteady gait after waking up from a nap this afternoon.  Patient's presentation is most consistent with acute presentation with potential threat to life or bodily function.  Differential diagnosis includes, but is not limited to, arrhythmia, vasovagal episode, orthostatic hypotension, stroke, TIA, anemia, electrolyte abnormality, AKI, pneumonia.  Patient nontoxic-appearing and in no acute distress, vital signs are unremarkable.  Patient states her symptoms have resolved and she has no focal neurologic deficits on exam.  Will check CT head but low suspicion for acute stroke  at this time given she describes dizziness as more of a lightheadedness been vertiginous.  Will also check chest x-ray given her recent cough, lab results are pending.  CT head is negative for acute process, chest x-ray also unremarkable.  Labs without significant anemia, leukocytosis, electrolyte abnormality, or AKI.  LFTs are also unremarkable and patient asymptomatic on reassessment.  She is ambulatory without difficulty here in the ED and is appropriate for discharge home with outpatient follow-up.  Patient counseled to return to the ED for new or worsening symptoms, patient agrees with plan.      FINAL CLINICAL IMPRESSION(S) / ED DIAGNOSES   Final diagnoses:  Dizziness  Lightheadedness     Rx / DC Orders   ED Discharge Orders     None        Note:  This document was prepared using Dragon voice recognition software and may include unintentional dictation errors.   Twilla Galea, MD 07/25/23 [phone]

## 2023-07-25 NOTE — ED Triage Notes (Signed)
 Per EMS  Dizziness Started today Unsteady on feet Cough Ongoing  weeks

## 2023-07-26 ENCOUNTER — Ambulatory Visit (INDEPENDENT_AMBULATORY_CARE_PROVIDER_SITE_OTHER): Admitting: Internal Medicine

## 2023-07-26 ENCOUNTER — Encounter: Payer: Self-pay | Admitting: Internal Medicine

## 2023-07-26 VITALS — BP 114/64 | HR 65 | Ht 64.0 in | Wt 154.6 lb

## 2023-07-26 DIAGNOSIS — E559 Vitamin D deficiency, unspecified: Secondary | ICD-10-CM

## 2023-07-26 DIAGNOSIS — Z1231 Encounter for screening mammogram for malignant neoplasm of breast: Secondary | ICD-10-CM

## 2023-07-26 DIAGNOSIS — E785 Hyperlipidemia, unspecified: Secondary | ICD-10-CM

## 2023-07-26 DIAGNOSIS — E782 Mixed hyperlipidemia: Secondary | ICD-10-CM | POA: Diagnosis not present

## 2023-07-26 DIAGNOSIS — E1159 Type 2 diabetes mellitus with other circulatory complications: Secondary | ICD-10-CM | POA: Diagnosis not present

## 2023-07-26 DIAGNOSIS — R413 Other amnesia: Secondary | ICD-10-CM

## 2023-07-26 DIAGNOSIS — I152 Hypertension secondary to endocrine disorders: Secondary | ICD-10-CM

## 2023-07-26 DIAGNOSIS — E119 Type 2 diabetes mellitus without complications: Secondary | ICD-10-CM

## 2023-07-26 DIAGNOSIS — E1169 Type 2 diabetes mellitus with other specified complication: Secondary | ICD-10-CM

## 2023-07-26 LAB — POCT CBG (FASTING - GLUCOSE)-MANUAL ENTRY: Glucose Fasting, POC: 112 mg/dL — AB (ref 70–99)

## 2023-07-26 NOTE — Progress Notes (Signed)
 Established Patient Office Visit  Subjective:  Patient ID: Helen Mccormick, female    DOB: [birth year]  Age: 83 y.o. MRN: [id number]  Chief Complaint  Patient presents with   Follow-up    1 month follow up    Patient comes in for her follow-up today.  She went to the emergency room yesterday with complaints of feeling tired and dizzy.  She had a negative workup and was sent home.  Today she is feeling much better.  She has an appointment to see her endocrinologist soon.  However she is waiting to hear from the neurologist where she was referred for memory impairment.  She has no new complaints today.    No other concerns at this time.   Past Medical History:  Diagnosis Date   Allergy    Anxiety    CHF (congestive heart failure) (HCC)    Colon polyp    Depression    GERD (gastroesophageal reflux disease)    Heart murmur    Hyperlipidemia    Hypertension    Hypothyroidism    Sleep apnea    Thyroid  disease     Past Surgical History:  Procedure Laterality Date   ABDOMINAL HYSTERECTOMY     BACK SURGERY     BACK SURGERY     CERVICAL FUSION     COLONOSCOPY WITH PROPOFOL  N/A 05/28/2017   Procedure: COLONOSCOPY WITH PROPOFOL ;  Surgeon: Luke Salaam, MD;  Location: [facility] ENDOSCOPY;  Service: Gastroenterology;  Laterality: N/A;   COLONOSCOPY WITH PROPOFOL  N/A 11/17/2019   Procedure: COLONOSCOPY WITH PROPOFOL ;  Surgeon: Shane Darling, MD;  Location: [facility] ENDOSCOPY;  Service: Endoscopy;  Laterality: N/A;   COLONOSCOPY WITH PROPOFOL  N/A 04/20/2023   Procedure: COLONOSCOPY WITH PROPOFOL ;  Surgeon: Luke Salaam, MD;  Location: [facility] ENDOSCOPY;  Service: Gastroenterology;  Laterality: N/A;   ESOPHAGOGASTRODUODENOSCOPY (EGD) WITH PROPOFOL  N/A 11/17/2019   Procedure: ESOPHAGOGASTRODUODENOSCOPY (EGD) WITH PROPOFOL ;  Surgeon: Shane Darling, MD;  Location: [facility] ENDOSCOPY;  Service: Endoscopy;  Laterality: N/A;   ESOPHAGOGASTRODUODENOSCOPY (EGD) WITH PROPOFOL  N/A 04/20/2023    Procedure: ESOPHAGOGASTRODUODENOSCOPY (EGD) WITH PROPOFOL ;  Surgeon: Luke Salaam, MD;  Location: [facility] ENDOSCOPY;  Service: Gastroenterology;  Laterality: N/A;   IR KYPHO THORACIC WITH BONE BIOPSY  10/20/2022   IR RADIOLOGIST EVAL & MGMT  09/22/2022   NECK SURGERY     POLYPECTOMY  04/20/2023   Procedure: POLYPECTOMY;  Surgeon: Luke Salaam, MD;  Location: [facility] ENDOSCOPY;  Service: Gastroenterology;;   THYROID  SURGERY     THYROID  SURGERY      Social History   Socioeconomic History   Marital status: Single    Spouse name: Not on file   Number of children: Not on file   Years of education: Not on file   Highest education level: 10th grade  Occupational History   Occupation: retired  Tobacco Use   Smoking status: Never   Smokeless tobacco: Former    Types: Associate Professor status: Never Used  Substance and Sexual Activity   Alcohol use: No   Drug use: No   Sexual activity: Not Currently  Other Topics Concern   Not on file  Social History Narrative   Not on file   Social Drivers of Health   Financial Resource Strain: Low Risk  (04/27/2017)   Overall Financial Resource Strain (CARDIA)    Difficulty of Paying Living Expenses: Not hard at all  Food Insecurity: No Food Insecurity (04/27/2017)   Hunger Vital Sign  Worried About Programme researcher, broadcasting/film/video in the Last Year: Never true    Ran Out of Food in the Last Year: Never true  Transportation Needs: No Transportation Needs (04/27/2017)   PRAPARE - Administrator, Civil Service (Medical): No    Lack of Transportation (Non-Medical): No  Physical Activity: Sufficiently Active (12/20/2018)   Exercise Vital Sign    Days of Exercise per Week: 4 days    Minutes of Exercise per Session: 60 min  Stress: No Stress Concern Present (03/12/2017)   [employer] of Occupational Health - Occupational Stress Questionnaire    Feeling of Stress : Only a little  Social Connections: Moderately Integrated (04/27/2017)   Social  Connection and Isolation Panel [NHANES]    Frequency of Communication with Friends and Family: More than three times a week    Frequency of Social Gatherings with Friends and Family: Once a week    Attends Religious Services: More than 4 times per year    Active Member of [employer] or Organizations: Yes    Attends Banker Meetings: More than 4 times per year    Marital Status: Never married  Intimate Partner Violence: Unknown (03/12/2017)   Humiliation, Afraid, Rape, and Kick questionnaire    Fear of Current or Ex-Partner: Patient declined    Emotionally Abused: Patient declined    Physically Abused: Patient declined    Sexually Abused: Patient declined    Family History  Problem Relation Age of Onset   Breast cancer Maternal Aunt 48   Diabetes Mother    Leukemia Mother    Heart Problems Mother    Diabetes Sister    Mental illness Sister    Colon polyps Sister    Stroke Father    Cancer Brother        rectal   Healthy Daughter    Hypertension Son    Diabetes Sister    Thyroid  disease Sister    Healthy Sister    Healthy Sister    Stroke Brother    Healthy Son    Healthy Son    Healthy Daughter     Allergies  Allergen Reactions   Amlodipine  Hives and Swelling   Tramadol  Other (See Comments)    Dizziness    Isosorbide  Dinitrate     Other Reaction(s): Headache   Codeine Nausea Only   Etodolac Other (See Comments)    Other reaction(s): Abdominal Pain    Outpatient Medications Prior to Visit  Medication Sig   Accu-Chek Softclix Lancets lancets Use as instructed   albuterol  (VENTOLIN  HFA) 108 (90 Base) MCG/ACT inhaler Inhale 2 puffs by mouth every 6 hours as needed for wheezing/shortness of breath   amLODipine  (NORVASC ) 5 MG tablet Take 5 mg by mouth daily.   ARNUITY ELLIPTA  100 MCG/ACT AEPB Inhale 1 puff by mouth every day   Azelastine HCl 137 MCG/SPRAY SOLN Use 1 spray into each nostril every day as directed   carvedilol  (COREG ) 12.5 MG tablet Take 1  tablet by mouth twice daily   Cholecalciferol  (D3) 25 MCG (1000 UT) capsule Take 1,000 Units by mouth daily.   ENTRESTO  97-103 MG Take 1 tablet by mouth twice daily   escitalopram  (LEXAPRO ) 10 MG tablet Take 1 tablet by mouth every day   furosemide  (LASIX ) 20 MG tablet Take 20 mg by mouth daily.   isosorbide  dinitrate (ISORDIL ) 30 MG tablet Take 1 tablet by mouth every day   JARDIANCE  25 MG TABS tablet Take 1 tablet by  mouth every day   levothyroxine  (SYNTHROID ) 75 MCG tablet Take 1 tablet (75 mcg total) by mouth daily.   Multiple Vitamin (MULTIVITAMIN) tablet Take 1 tablet by mouth daily.   ONETOUCH VERIO test strip USE TO CHECK BLOOD SUGAR TWICE DAILY   oxycodone  (OXY-IR) 5 MG capsule Take 5 mg by mouth every 4 (four) hours as needed.   pantoprazole  (PROTONIX ) 40 MG tablet Take one tablet by mouth daily   rosuvastatin (CRESTOR) 40 MG tablet Take 40 mg by mouth daily.   Blood Glucose Monitoring Suppl (ACCU-CHEK GUIDE) w/Device KIT See admin instructions. (Patient not taking: Reported on 07/26/2023)   busPIRone (BUSPAR) 15 MG tablet Take 15 mg by mouth 2 (two) times daily. (Patient not taking: Reported on 07/26/2023)   fluticasone  (FLOVENT  HFA) 110 MCG/ACT inhaler Inhale 1 puff into the lungs 2 (two) times daily. (Patient not taking: Reported on 07/26/2023)   meclizine  (ANTIVERT ) 12.5 MG tablet Take 1 tablet (12.5 mg total) by mouth 3 (three) times daily as needed for dizziness. (Patient not taking: Reported on 07/26/2023)   nitroGLYCERIN  (NITROSTAT ) 0.4 MG SL tablet Place 1 tablet (0.4 mg total) under the tongue every 5 (five) minutes as needed for chest pain. (Patient not taking: Reported on 06/15/2023)   [DISCONTINUED] ibuprofen  (ADVIL ) 200 MG tablet Take 200 mg by mouth every 6 (six) hours as needed. (Patient not taking: Reported on 07/26/2023)   No facility-administered medications prior to visit.    Review of Systems  Constitutional: Negative.  Negative for chills, diaphoresis, fever,  malaise/fatigue and weight loss.  HENT: Negative.  Negative for sore throat.   Eyes: Negative.   Respiratory: Negative.  Negative for cough and shortness of breath.   Cardiovascular: Negative.  Negative for chest pain, palpitations and leg swelling.  Gastrointestinal: Negative.  Negative for abdominal pain, constipation, diarrhea, heartburn, nausea and vomiting.  Genitourinary: Negative.  Negative for dysuria and flank pain.  Musculoskeletal: Negative.  Negative for joint pain and myalgias.  Skin: Negative.   Neurological:  Negative for dizziness, tingling, tremors, sensory change and headaches.  Endo/Heme/Allergies: Negative.   Psychiatric/Behavioral: Negative.  Negative for depression and suicidal ideas. The patient is not nervous/anxious.        Objective:   BP 114/64   Pulse 65   Ht 5\' 4"  (1.626 m)   Wt 154 lb 9.6 oz (70.1 kg)   LMP  (LMP Unknown)   SpO2 98%   BMI 26.54 kg/m   Vitals:   07/26/23 0939  BP: 114/64  Pulse: 65  Height: 5\' 4"  (1.626 m)  Weight: 154 lb 9.6 oz (70.1 kg)  SpO2: 98%  BMI (Calculated): 26.52    Physical Exam Vitals and nursing note reviewed.  Constitutional:      Appearance: Normal appearance.  HENT:     Head: Normocephalic and atraumatic.     Nose: Nose normal.     Mouth/Throat:     Mouth: Mucous membranes are moist.     Pharynx: Oropharynx is clear.  Eyes:     Conjunctiva/sclera: Conjunctivae normal.     Pupils: Pupils are equal, round, and reactive to light.  Cardiovascular:     Rate and Rhythm: Normal rate and regular rhythm.     Pulses: Normal pulses.     Heart sounds: Normal heart sounds. No murmur heard. Pulmonary:     Effort: Pulmonary effort is normal.     Breath sounds: Normal breath sounds. No wheezing.  Abdominal:     General: Bowel sounds are normal.  Palpations: Abdomen is soft.     Tenderness: There is no abdominal tenderness. There is no right CVA tenderness or left CVA tenderness.  Musculoskeletal:         General: Normal range of motion.     Cervical back: Normal range of motion.     Right lower leg: No edema.     Left lower leg: No edema.  Skin:    General: Skin is warm and dry.  Neurological:     General: No focal deficit present.     Mental Status: She is alert and oriented to person, place, and time.  Psychiatric:        Mood and Affect: Mood normal.        Behavior: Behavior normal.      Results for orders placed or performed in visit on 07/26/23  POCT CBG (Fasting - Glucose)  Result Value Ref Range   Glucose Fasting, POC 112 (A) 70 - 99 mg/dL    Recent Results (from the past 2160 hours)  CBC with Differential/Platelet     Status: Abnormal   Collection Time: 05/05/23  9:59 AM  Result Value Ref Range   WBC 5.1 3.4 - 10.8 x10E3/uL   RBC 5.27 3.77 - 5.28 x10E6/uL   Hemoglobin 12.6 11.1 - 15.9 g/dL   Hematocrit 16.1 09.6 - 46.6 %   MCV 79 79 - 97 fL   MCH 23.9 (L) 26.6 - 33.0 pg   MCHC 30.1 (L) 31.5 - 35.7 g/dL   RDW 04.5 40.9 - 81.1 %   Platelets 241 150 - 450 x10E3/uL   Neutrophils 47 Not Estab. %   Lymphs 42 Not Estab. %   Monocytes 8 Not Estab. %   Eos 3 Not Estab. %   Basos 0 Not Estab. %   Neutrophils Absolute 2.4 1.4 - 7.0 x10E3/uL   Lymphocytes Absolute 2.1 0.7 - 3.1 x10E3/uL   Monocytes Absolute 0.4 0.1 - 0.9 x10E3/uL   EOS (ABSOLUTE) 0.1 0.0 - 0.4 x10E3/uL   Basophils Absolute 0.0 0.0 - 0.2 x10E3/uL   Immature Granulocytes 0 Not Estab. %   Immature Grans (Abs) 0.0 0.0 - 0.1 x10E3/uL  Iron, TIBC and Ferritin Panel     Status: Abnormal   Collection Time: 05/05/23  9:59 AM  Result Value Ref Range   Total Iron Binding Capacity 271 250 - 450 ug/dL   UIBC 914 782 - 956 ug/dL   Iron 72 27 - 213 ug/dL   Iron Saturation 27 15 - 55 %   Ferritin 167 (H) 15 - 150 ng/mL  CBC with Diff     Status: Abnormal   Collection Time: 06/15/23 11:52 AM  Result Value Ref Range   WBC 5.8 3.4 - 10.8 x10E3/uL   RBC 4.68 3.77 - 5.28 x10E6/uL   Hemoglobin 11.2 11.1 - 15.9 g/dL    Hematocrit 08.6 57.8 - 46.6 %   MCV 80 79 - 97 fL   MCH 23.9 (L) 26.6 - 33.0 pg   MCHC 30.1 (L) 31.5 - 35.7 g/dL   RDW 46.9 (H) 62.9 - 52.8 %   Platelets 181 150 - 450 x10E3/uL   Neutrophils 55 Not Estab. %   Lymphs 32 Not Estab. %   Monocytes 10 Not Estab. %   Eos 3 Not Estab. %   Basos 0 Not Estab. %   Neutrophils Absolute 3.2 1.4 - 7.0 x10E3/uL   Lymphocytes Absolute 1.8 0.7 - 3.1 x10E3/uL   Monocytes Absolute 0.6 0.1 - 0.9  x10E3/uL   EOS (ABSOLUTE) 0.2 0.0 - 0.4 x10E3/uL   Basophils Absolute 0.0 0.0 - 0.2 x10E3/uL   Immature Granulocytes 0 Not Estab. %   Immature Grans (Abs) 0.0 0.0 - 0.1 x10E3/uL  CMP14+EGFR     Status: Abnormal   Collection Time: 06/15/23 11:52 AM  Result Value Ref Range   Glucose 101 (H) 70 - 99 mg/dL   BUN 18 8 - 27 mg/dL   Creatinine, Ser 6.64 0.57 - 1.00 mg/dL   eGFR 62 >40 HK/VQQ/5.95   BUN/Creatinine Ratio 19 12 - 28   Sodium 143 134 - 144 mmol/L   Potassium 4.1 3.5 - 5.2 mmol/L   Chloride 104 96 - 106 mmol/L   CO2 24 20 - 29 mmol/L   Calcium  9.0 8.7 - 10.3 mg/dL   Total Protein 6.6 6.0 - 8.5 g/dL   Albumin 4.1 3.8 - 4.8 g/dL   Globulin, Total 2.5 1.5 - 4.5 g/dL   Bilirubin Total 0.4 0.0 - 1.2 mg/dL   Alkaline Phosphatase 131 (H) 44 - 121 IU/L   AST 22 0 - 40 IU/L   ALT 23 0 - 32 IU/L  Lipid Panel w/o Chol/HDL Ratio     Status: None   Collection Time: 06/15/23 11:52 AM  Result Value Ref Range   Cholesterol, Total 129 100 - 199 mg/dL   Triglycerides 90 0 - 149 mg/dL   HDL 42 >63 mg/dL   VLDL Cholesterol Cal 17 5 - 40 mg/dL   LDL Chol Calc (NIH) 70 0 - 99 mg/dL  Vitamin B12     Status: None   Collection Time: 06/15/23 11:52 AM  Result Value Ref Range   Vitamin B-12 521 232 - 1,245 pg/mL  Vitamin D  (25 hydroxy)     Status: Abnormal   Collection Time: 06/15/23 11:52 AM  Result Value Ref Range   Vit D, 25-Hydroxy 28.0 (L) 30.0 - 100.0 ng/mL    Comment: Vitamin D  deficiency has been defined by the Institute of Medicine and an Endocrine  Society practice guideline as a level of serum 25-OH vitamin D  less than 20 ng/mL (1,2). The Endocrine Society went on to further define vitamin D  insufficiency as a level between 21 and 29 ng/mL (2). 1. IOM (Institute of Medicine). 2010. Dietary reference    intakes for calcium  and D. [place]: The    [employer]. 2. Holick MF, [address], Bischoff-Ferrari HA, et al.    Evaluation, treatment, and prevention of vitamin D     deficiency: an Endocrine Society clinical practice    guideline. JCEM. 2011 Jul; 96(7):1911-30.   Sed Rate (ESR)     Status: None   Collection Time: 06/15/23 11:52 AM  Result Value Ref Range   Sed Rate 28 0 - 40 mm/hr  TSH     Status: None   Collection Time: 06/15/23 11:53 AM  Result Value Ref Range   TSH 0.931 0.450 - 4.500 uIU/mL  POC Influenza A&B (Binax test)     Status: None   Collection Time: 06/15/23 11:53 AM  Result Value Ref Range   Influenza A, POC Negative Negative   Influenza B, POC Negative Negative  CBC with Differential     Status: Abnormal   Collection Time: 07/25/23  4:02 PM  Result Value Ref Range   WBC 6.3 4.0 - 10.5 K/uL   RBC 4.96 3.87 - 5.11 MIL/uL   Hemoglobin 12.1 12.0 - 15.0 g/dL   HCT 87.5 64.3 - 32.9 %  MCV 76.2 (L) 80.0 - 100.0 fL   MCH 24.4 (L) 26.0 - 34.0 pg   MCHC 32.0 30.0 - 36.0 g/dL   RDW 16.1 (H) 09.6 - 04.5 %   Platelets 203 150 - 400 K/uL   nRBC 0.0 0.0 - 0.2 %   Neutrophils Relative % 38 %   Neutro Abs 2.4 1.7 - 7.7 K/uL   Lymphocytes Relative 53 %   Lymphs Abs 3.4 0.7 - 4.0 K/uL   Monocytes Relative 7 %   Monocytes Absolute 0.4 0.1 - 1.0 K/uL   Eosinophils Relative 2 %   Eosinophils Absolute 0.1 0.0 - 0.5 K/uL   Basophils Relative 0 %   Basophils Absolute 0.0 0.0 - 0.1 K/uL   Immature Granulocytes 0 %   Abs Immature Granulocytes 0.01 0.00 - 0.07 K/uL    Comment: Performed at [facility], [address] [place], [place] [address]  Comprehensive metabolic panel     Status:  Abnormal   Collection Time: 07/25/23  4:02 PM  Result Value Ref Range   Sodium 142 135 - 145 mmol/L   Potassium 3.7 3.5 - 5.1 mmol/L   Chloride 109 98 - 111 mmol/L   CO2 26 22 - 32 mmol/L   Glucose, Bld 96 70 - 99 mg/dL    Comment: Glucose reference range applies only to samples taken after fasting for at least 8 hours.   BUN 27 (H) 8 - 23 mg/dL   Creatinine, Ser 1.91 0.44 - 1.00 mg/dL   Calcium  8.9 8.9 - 10.3 mg/dL   Total Protein 7.2 6.5 - 8.1 g/dL   Albumin 4.0 3.5 - 5.0 g/dL   AST 22 15 - 41 U/L   ALT 15 0 - 44 U/L   Alkaline Phosphatase 81 38 - 126 U/L   Total Bilirubin 0.6 0.0 - 1.2 mg/dL   GFR, Estimated >47 >82 mL/min    Comment: (NOTE) Calculated using the CKD-EPI Creatinine Equation (2021)    Anion gap 7 5 - 15    Comment: Performed at [facility], [address] [place], [place] [address]  POCT CBG (Fasting - Glucose)     Status: Abnormal   Collection Time: 07/26/23  9:47 AM  Result Value Ref Range   Glucose Fasting, POC 112 (A) 70 - 99 mg/dL      Assessment & Plan:  Patient advised to continue taking her medications.  Keep her follow-up appointments.  Needs to be scheduled for mammogram. Problem List Items Addressed This Visit     Hypertension associated with diabetes (HCC) - Primary   Relevant Medications   amLODipine  (NORVASC ) 5 MG tablet   DM (diabetes mellitus), type 2 (HCC)   Relevant Orders   POCT CBG (Fasting - Glucose) (Completed)   Hyperlipidemia due to type 2 diabetes mellitus (HCC)   Relevant Medications   amLODipine  (NORVASC ) 5 MG tablet   Mixed hyperlipidemia   Relevant Medications   amLODipine  (NORVASC ) 5 MG tablet   Other Visit Diagnoses       Breast cancer screening by mammogram         Vitamin D  deficiency         Gradual-onset memory impairment           Return in about 3 months (around 10/25/2023).   Total time spent: 30 minutes  Aisha Hove, MD  07/26/2023   This document may have been prepared by [facility]  Voice Recognition software and as such may include unintentional dictation errors.

## 2023-07-27 DIAGNOSIS — G8929 Other chronic pain: Secondary | ICD-10-CM | POA: Diagnosis not present

## 2023-07-27 DIAGNOSIS — M25512 Pain in left shoulder: Secondary | ICD-10-CM | POA: Diagnosis not present

## 2023-07-27 DIAGNOSIS — M5441 Lumbago with sciatica, right side: Secondary | ICD-10-CM | POA: Diagnosis not present

## 2023-07-27 DIAGNOSIS — M542 Cervicalgia: Secondary | ICD-10-CM | POA: Diagnosis not present

## 2023-07-27 DIAGNOSIS — M5412 Radiculopathy, cervical region: Secondary | ICD-10-CM | POA: Diagnosis not present

## 2023-07-27 DIAGNOSIS — M5416 Radiculopathy, lumbar region: Secondary | ICD-10-CM | POA: Diagnosis not present

## 2023-07-27 DIAGNOSIS — M25511 Pain in right shoulder: Secondary | ICD-10-CM | POA: Diagnosis not present

## 2023-07-29 ENCOUNTER — Telehealth: Payer: Self-pay | Admitting: Internal Medicine

## 2023-07-29 NOTE — Telephone Encounter (Signed)
 Patient left VM that she was having stomach pains so bad last night that it was keeping her up at night. Requesting a call back.

## 2023-07-30 ENCOUNTER — Ambulatory Visit (INDEPENDENT_AMBULATORY_CARE_PROVIDER_SITE_OTHER): Admitting: Cardiology

## 2023-07-30 ENCOUNTER — Encounter: Payer: Self-pay | Admitting: Cardiology

## 2023-07-30 VITALS — BP 138/62 | HR 74 | Ht 64.0 in | Wt 151.0 lb

## 2023-07-30 DIAGNOSIS — R1030 Lower abdominal pain, unspecified: Secondary | ICD-10-CM | POA: Diagnosis not present

## 2023-07-30 DIAGNOSIS — R109 Unspecified abdominal pain: Secondary | ICD-10-CM

## 2023-07-30 DIAGNOSIS — Z0130 Encounter for examination of blood pressure without abnormal findings: Secondary | ICD-10-CM

## 2023-07-30 LAB — POCT URINALYSIS DIPSTICK
Bilirubin, UA: NEGATIVE
Blood, UA: NEGATIVE
Glucose, UA: POSITIVE — AB
Ketones, UA: NEGATIVE
Leukocytes, UA: NEGATIVE
Nitrite, UA: NEGATIVE
Protein, UA: POSITIVE — AB
Spec Grav, UA: 1.025 (ref 1.010–1.025)
Urobilinogen, UA: 0.2 U/dL
pH, UA: 5.5 (ref 5.0–8.0)

## 2023-07-30 NOTE — Progress Notes (Addendum)
 Established Patient Office Visit  Subjective:  Patient ID: Helen Mccormick, female    DOB: 12/04/1940  Age: 83 y.o. MRN: [id number]  Chief Complaint  Patient presents with   Acute Visit    Abdominal Pain    Patient in office for an acute visit, complaining of lower abdominal pain. Patient complaining of lower abdominal pain, bowel movements "burn" for the past three months. Denies diarrhea, constipation, nausea and vomiting. Patient reports decrease in appetite, forces herself to eat small meals. Patient states if she eats too much, she will vomit.  Colonoscopy 03/2023, one polyp removed. Will order an abdominal/pelvis CT to evaluate pain. UA today normal, will send for culture.      No other concerns at this time.   Past Medical History:  Diagnosis Date   Allergy    Anxiety    CHF (congestive heart failure) (HCC)    Colon polyp    Depression    GERD (gastroesophageal reflux disease)    Heart murmur    Hyperlipidemia    Hypertension    Hypothyroidism    Sleep apnea    Thyroid  disease     Past Surgical History:  Procedure Laterality Date   ABDOMINAL HYSTERECTOMY     BACK SURGERY     BACK SURGERY     CERVICAL FUSION     COLONOSCOPY WITH PROPOFOL  N/A 05/28/2017   Procedure: COLONOSCOPY WITH PROPOFOL ;  Surgeon: Luke Salaam, MD;  Location: [facility] ENDOSCOPY;  Service: Gastroenterology;  Laterality: N/A;   COLONOSCOPY WITH PROPOFOL  N/A 11/17/2019   Procedure: COLONOSCOPY WITH PROPOFOL ;  Surgeon: Shane Darling, MD;  Location: [facility] ENDOSCOPY;  Service: Endoscopy;  Laterality: N/A;   COLONOSCOPY WITH PROPOFOL  N/A 04/20/2023   Procedure: COLONOSCOPY WITH PROPOFOL ;  Surgeon: Luke Salaam, MD;  Location: [facility] ENDOSCOPY;  Service: Gastroenterology;  Laterality: N/A;   ESOPHAGOGASTRODUODENOSCOPY (EGD) WITH PROPOFOL  N/A 11/17/2019   Procedure: ESOPHAGOGASTRODUODENOSCOPY (EGD) WITH PROPOFOL ;  Surgeon: Shane Darling, MD;  Location: [facility] ENDOSCOPY;  Service: Endoscopy;   Laterality: N/A;   ESOPHAGOGASTRODUODENOSCOPY (EGD) WITH PROPOFOL  N/A 04/20/2023   Procedure: ESOPHAGOGASTRODUODENOSCOPY (EGD) WITH PROPOFOL ;  Surgeon: Luke Salaam, MD;  Location: [facility] ENDOSCOPY;  Service: Gastroenterology;  Laterality: N/A;   IR KYPHO THORACIC WITH BONE BIOPSY  10/20/2022   IR RADIOLOGIST EVAL & MGMT  09/22/2022   NECK SURGERY     POLYPECTOMY  04/20/2023   Procedure: POLYPECTOMY;  Surgeon: Luke Salaam, MD;  Location: [facility] ENDOSCOPY;  Service: Gastroenterology;;   THYROID  SURGERY     THYROID  SURGERY      Social History   Socioeconomic History   Marital status: Single    Spouse name: Not on file   Number of children: Not on file   Years of education: Not on file   Highest education level: 10th grade  Occupational History   Occupation: retired  Tobacco Use   Smoking status: Never   Smokeless tobacco: Former    Types: Associate Professor status: Never Used  Substance and Sexual Activity   Alcohol use: No   Drug use: No   Sexual activity: Not Currently  Other Topics Concern   Not on file  Social History Narrative   Not on file   Social Drivers of Health   Financial Resource Strain: Low Risk  (04/27/2017)   Overall Financial Resource Strain (CARDIA)    Difficulty of Paying Living Expenses: Not hard at all  Food Insecurity: No Food Insecurity (04/27/2017)   Hunger Vital Sign  Worried About Programme researcher, broadcasting/film/video in the Last Year: Never true    Ran Out of Food in the Last Year: Never true  Transportation Needs: No Transportation Needs (04/27/2017)   PRAPARE - Administrator, Civil Service (Medical): No    Lack of Transportation (Non-Medical): No  Physical Activity: Sufficiently Active (12/20/2018)   Exercise Vital Sign    Days of Exercise per Week: 4 days    Minutes of Exercise per Session: 60 min  Stress: No Stress Concern Present (03/12/2017)   [employer] of Occupational Health - Occupational Stress Questionnaire    Feeling of Stress  : Only a little  Social Connections: Moderately Integrated (04/27/2017)   Social Connection and Isolation Panel [NHANES]    Frequency of Communication with Friends and Family: More than three times a week    Frequency of Social Gatherings with Friends and Family: Once a week    Attends Religious Services: More than 4 times per year    Active Member of [employer] or Organizations: Yes    Attends Banker Meetings: More than 4 times per year    Marital Status: Never married  Intimate Partner Violence: Unknown (03/12/2017)   Humiliation, Afraid, Rape, and Kick questionnaire    Fear of Current or Ex-Partner: Patient declined    Emotionally Abused: Patient declined    Physically Abused: Patient declined    Sexually Abused: Patient declined    Family History  Problem Relation Age of Onset   Breast cancer Maternal Aunt 77   Diabetes Mother    Leukemia Mother    Heart Problems Mother    Diabetes Sister    Mental illness Sister    Colon polyps Sister    Stroke Father    Cancer Brother        rectal   Healthy Daughter    Hypertension Son    Diabetes Sister    Thyroid  disease Sister    Healthy Sister    Healthy Sister    Stroke Brother    Healthy Son    Healthy Son    Healthy Daughter     Allergies  Allergen Reactions   Amlodipine  Hives and Swelling   Tramadol  Other (See Comments)    Dizziness    Isosorbide  Dinitrate     Other Reaction(s): Headache   Codeine Nausea Only   Etodolac Other (See Comments)    Other reaction(s): Abdominal Pain    Outpatient Medications Prior to Visit  Medication Sig   Accu-Chek Softclix Lancets lancets Use as instructed   albuterol  (VENTOLIN  HFA) 108 (90 Base) MCG/ACT inhaler Inhale 2 puffs by mouth every 6 hours as needed for wheezing/shortness of breath   amLODipine  (NORVASC ) 5 MG tablet Take 5 mg by mouth daily.   ARNUITY ELLIPTA  100 MCG/ACT AEPB Inhale 1 puff by mouth every day   Azelastine HCl 137 MCG/SPRAY SOLN Use 1 spray into  each nostril every day as directed   Blood Glucose Monitoring Suppl (ACCU-CHEK GUIDE) w/Device KIT See admin instructions. (Patient not taking: Reported on 07/26/2023)   busPIRone (BUSPAR) 15 MG tablet Take 15 mg by mouth 2 (two) times daily. (Patient not taking: Reported on 07/26/2023)   carvedilol  (COREG ) 12.5 MG tablet Take 1 tablet by mouth twice daily   Cholecalciferol  (D3) 25 MCG (1000 UT) capsule Take 1,000 Units by mouth daily.   ENTRESTO  97-103 MG Take 1 tablet by mouth twice daily   escitalopram  (LEXAPRO ) 10 MG tablet Take 1 tablet by mouth  every day   fluticasone  (FLOVENT  HFA) 110 MCG/ACT inhaler Inhale 1 puff into the lungs 2 (two) times daily. (Patient not taking: Reported on 07/26/2023)   furosemide  (LASIX ) 20 MG tablet Take 20 mg by mouth daily.   isosorbide  dinitrate (ISORDIL ) 30 MG tablet Take 1 tablet by mouth every day   JARDIANCE  25 MG TABS tablet Take 1 tablet by mouth every day   levothyroxine  (SYNTHROID ) 75 MCG tablet Take 1 tablet (75 mcg total) by mouth daily.   meclizine  (ANTIVERT ) 12.5 MG tablet Take 1 tablet (12.5 mg total) by mouth 3 (three) times daily as needed for dizziness. (Patient not taking: Reported on 07/26/2023)   Multiple Vitamin (MULTIVITAMIN) tablet Take 1 tablet by mouth daily.   nitroGLYCERIN  (NITROSTAT ) 0.4 MG SL tablet Place 1 tablet (0.4 mg total) under the tongue every 5 (five) minutes as needed for chest pain. (Patient not taking: Reported on 06/15/2023)   ONETOUCH VERIO test strip USE TO CHECK BLOOD SUGAR TWICE DAILY   oxycodone  (OXY-IR) 5 MG capsule Take 5 mg by mouth every 4 (four) hours as needed.   pantoprazole  (PROTONIX ) 40 MG tablet Take one tablet by mouth daily   rosuvastatin (CRESTOR) 40 MG tablet Take 40 mg by mouth daily.   No facility-administered medications prior to visit.    Review of Systems  Constitutional: Negative.   HENT: Negative.    Eyes: Negative.   Respiratory: Negative.  Negative for shortness of breath.    Cardiovascular: Negative.  Negative for chest pain.  Gastrointestinal:  Positive for abdominal pain. Negative for blood in stool, constipation, diarrhea, nausea and vomiting.  Genitourinary: Negative.   Musculoskeletal:  Negative for joint pain and myalgias.  Skin: Negative.   Neurological: Negative.  Negative for dizziness and headaches.  Endo/Heme/Allergies: Negative.   All other systems reviewed and are negative.      Objective:   BP 138/62   Pulse 74   Ht 5\' 4"  (1.626 m)   Wt 151 lb (68.5 kg)   LMP  (LMP Unknown)   SpO2 96%   BMI 25.92 kg/m   Vitals:   07/30/23 0910  BP: 138/62  Pulse: 74  Height: 5\' 4"  (1.626 m)  Weight: 151 lb (68.5 kg)  SpO2: 96%  BMI (Calculated): 25.91    Physical Exam Vitals and nursing note reviewed.  Constitutional:      Appearance: Normal appearance. She is normal weight.  HENT:     Head: Normocephalic and atraumatic.     Nose: Nose normal.     Mouth/Throat:     Mouth: Mucous membranes are moist.  Eyes:     Extraocular Movements: Extraocular movements intact.     Conjunctiva/sclera: Conjunctivae normal.     Pupils: Pupils are equal, round, and reactive to light.  Cardiovascular:     Rate and Rhythm: Normal rate and regular rhythm.     Pulses: Normal pulses.     Heart sounds: Normal heart sounds.  Pulmonary:     Effort: Pulmonary effort is normal.     Breath sounds: Normal breath sounds.  Abdominal:     General: Abdomen is flat. Bowel sounds are normal.     Palpations: Abdomen is soft.  Musculoskeletal:        General: Normal range of motion.     Cervical back: Normal range of motion.  Skin:    General: Skin is warm and dry.  Neurological:     General: No focal deficit present.     Mental Status:  She is alert and oriented to person, place, and time.  Psychiatric:        Mood and Affect: Mood normal.        Behavior: Behavior normal.        Thought Content: Thought content normal.        Judgment: Judgment normal.       Results for orders placed or performed in visit on 07/30/23  POCT Urinalysis Dipstick (81002)  Result Value Ref Range   Color, UA     Clarity, UA     Glucose, UA Positive (A) Negative   Bilirubin, UA Negative    Ketones, UA Negative    Spec Grav, UA 1.025 1.010 - 1.025   Blood, UA Negative    pH, UA 5.5 5.0 - 8.0   Protein, UA Positive (A) Negative   Urobilinogen, UA 0.2 0.2 or 1.0 E.U./dL   Nitrite, UA Negative    Leukocytes, UA Negative Negative   Appearance     Odor      Recent Results (from the past 2160 hours)  CBC with Differential/Platelet     Status: Abnormal   Collection Time: 05/05/23  9:59 AM  Result Value Ref Range   WBC 5.1 3.4 - 10.8 x10E3/uL   RBC 5.27 3.77 - 5.28 x10E6/uL   Hemoglobin 12.6 11.1 - 15.9 g/dL   Hematocrit 28.4 13.2 - 46.6 %   MCV 79 79 - 97 fL   MCH 23.9 (L) 26.6 - 33.0 pg   MCHC 30.1 (L) 31.5 - 35.7 g/dL   RDW 44.0 10.2 - 72.5 %   Platelets 241 150 - 450 x10E3/uL   Neutrophils 47 Not Estab. %   Lymphs 42 Not Estab. %   Monocytes 8 Not Estab. %   Eos 3 Not Estab. %   Basos 0 Not Estab. %   Neutrophils Absolute 2.4 1.4 - 7.0 x10E3/uL   Lymphocytes Absolute 2.1 0.7 - 3.1 x10E3/uL   Monocytes Absolute 0.4 0.1 - 0.9 x10E3/uL   EOS (ABSOLUTE) 0.1 0.0 - 0.4 x10E3/uL   Basophils Absolute 0.0 0.0 - 0.2 x10E3/uL   Immature Granulocytes 0 Not Estab. %   Immature Grans (Abs) 0.0 0.0 - 0.1 x10E3/uL  Iron, TIBC and Ferritin Panel     Status: Abnormal   Collection Time: 05/05/23  9:59 AM  Result Value Ref Range   Total Iron Binding Capacity 271 250 - 450 ug/dL   UIBC 366 440 - 347 ug/dL   Iron 72 27 - 425 ug/dL   Iron Saturation 27 15 - 55 %   Ferritin 167 (H) 15 - 150 ng/mL  CBC with Diff     Status: Abnormal   Collection Time: 06/15/23 11:52 AM  Result Value Ref Range   WBC 5.8 3.4 - 10.8 x10E3/uL   RBC 4.68 3.77 - 5.28 x10E6/uL   Hemoglobin 11.2 11.1 - 15.9 g/dL   Hematocrit 95.6 38.7 - 46.6 %   MCV 80 79 - 97 fL   MCH 23.9 (L)  26.6 - 33.0 pg   MCHC 30.1 (L) 31.5 - 35.7 g/dL   RDW 56.4 (H) 33.2 - 95.1 %   Platelets 181 150 - 450 x10E3/uL   Neutrophils 55 Not Estab. %   Lymphs 32 Not Estab. %   Monocytes 10 Not Estab. %   Eos 3 Not Estab. %   Basos 0 Not Estab. %   Neutrophils Absolute 3.2 1.4 - 7.0 x10E3/uL   Lymphocytes Absolute 1.8 0.7 - 3.1 x10E3/uL  Monocytes Absolute 0.6 0.1 - 0.9 x10E3/uL   EOS (ABSOLUTE) 0.2 0.0 - 0.4 x10E3/uL   Basophils Absolute 0.0 0.0 - 0.2 x10E3/uL   Immature Granulocytes 0 Not Estab. %   Immature Grans (Abs) 0.0 0.0 - 0.1 x10E3/uL  CMP14+EGFR     Status: Abnormal   Collection Time: 06/15/23 11:52 AM  Result Value Ref Range   Glucose 101 (H) 70 - 99 mg/dL   BUN 18 8 - 27 mg/dL   Creatinine, Ser 2.84 0.57 - 1.00 mg/dL   eGFR 62 >13 KG/MWN/0.27   BUN/Creatinine Ratio 19 12 - 28   Sodium 143 134 - 144 mmol/L   Potassium 4.1 3.5 - 5.2 mmol/L   Chloride 104 96 - 106 mmol/L   CO2 24 20 - 29 mmol/L   Calcium  9.0 8.7 - 10.3 mg/dL   Total Protein 6.6 6.0 - 8.5 g/dL   Albumin 4.1 3.8 - 4.8 g/dL   Globulin, Total 2.5 1.5 - 4.5 g/dL   Bilirubin Total 0.4 0.0 - 1.2 mg/dL   Alkaline Phosphatase 131 (H) 44 - 121 IU/L   AST 22 0 - 40 IU/L   ALT 23 0 - 32 IU/L  Lipid Panel w/o Chol/HDL Ratio     Status: None   Collection Time: 06/15/23 11:52 AM  Result Value Ref Range   Cholesterol, Total 129 100 - 199 mg/dL   Triglycerides 90 0 - 149 mg/dL   HDL 42 >25 mg/dL   VLDL Cholesterol Cal 17 5 - 40 mg/dL   LDL Chol Calc (NIH) 70 0 - 99 mg/dL  Vitamin B12     Status: None   Collection Time: 06/15/23 11:52 AM  Result Value Ref Range   Vitamin B-12 521 232 - 1,245 pg/mL  Vitamin D  (25 hydroxy)     Status: Abnormal   Collection Time: 06/15/23 11:52 AM  Result Value Ref Range   Vit D, 25-Hydroxy 28.0 (L) 30.0 - 100.0 ng/mL    Comment: Vitamin D  deficiency has been defined by the Institute of Medicine and an Endocrine Society practice guideline as a level of serum 25-OH vitamin D  less  than 20 ng/mL (1,2). The Endocrine Society went on to further define vitamin D  insufficiency as a level between 21 and 29 ng/mL (2). 1. IOM (Institute of Medicine). 2010. Dietary reference    intakes for calcium  and D. [place]: The    [employer]. 2. Holick MF, Binkley [place], Bischoff-Ferrari HA, et al.    Evaluation, treatment, and prevention of vitamin D     deficiency: an Endocrine Society clinical practice    guideline. JCEM. 2011 Jul; 96(7):1911-30.   Sed Rate (ESR)     Status: None   Collection Time: 06/15/23 11:52 AM  Result Value Ref Range   Sed Rate 28 0 - 40 mm/hr  TSH     Status: None   Collection Time: 06/15/23 11:53 AM  Result Value Ref Range   TSH 0.931 0.450 - 4.500 uIU/mL  POC Influenza A&B (Binax test)     Status: None   Collection Time: 06/15/23 11:53 AM  Result Value Ref Range   Influenza A, POC Negative Negative   Influenza B, POC Negative Negative  CBC with Differential     Status: Abnormal   Collection Time: 07/25/23  4:02 PM  Result Value Ref Range   WBC 6.3 4.0 - 10.5 K/uL   RBC 4.96 3.87 - 5.11 MIL/uL   Hemoglobin 12.1 12.0 - 15.0 g/dL   HCT  37.8 36.0 - 46.0 %   MCV 76.2 (L) 80.0 - 100.0 fL   MCH 24.4 (L) 26.0 - 34.0 pg   MCHC 32.0 30.0 - 36.0 g/dL   RDW 16.1 (H) 09.6 - 04.5 %   Platelets 203 150 - 400 K/uL   nRBC 0.0 0.0 - 0.2 %   Neutrophils Relative % 38 %   Neutro Abs 2.4 1.7 - 7.7 K/uL   Lymphocytes Relative 53 %   Lymphs Abs 3.4 0.7 - 4.0 K/uL   Monocytes Relative 7 %   Monocytes Absolute 0.4 0.1 - 1.0 K/uL   Eosinophils Relative 2 %   Eosinophils Absolute 0.1 0.0 - 0.5 K/uL   Basophils Relative 0 %   Basophils Absolute 0.0 0.0 - 0.1 K/uL   Immature Granulocytes 0 %   Abs Immature Granulocytes 0.01 0.00 - 0.07 K/uL    Comment: Performed at [facility], [address] [place], [place] [address]  Comprehensive metabolic panel     Status: Abnormal   Collection Time: 07/25/23  4:02 PM  Result Value Ref Range    Sodium 142 135 - 145 mmol/L   Potassium 3.7 3.5 - 5.1 mmol/L   Chloride 109 98 - 111 mmol/L   CO2 26 22 - 32 mmol/L   Glucose, Bld 96 70 - 99 mg/dL    Comment: Glucose reference range applies only to samples taken after fasting for at least 8 hours.   BUN 27 (H) 8 - 23 mg/dL   Creatinine, Ser 1.91 0.44 - 1.00 mg/dL   Calcium  8.9 8.9 - 10.3 mg/dL   Total Protein 7.2 6.5 - 8.1 g/dL   Albumin 4.0 3.5 - 5.0 g/dL   AST 22 15 - 41 U/L   ALT 15 0 - 44 U/L   Alkaline Phosphatase 81 38 - 126 U/L   Total Bilirubin 0.6 0.0 - 1.2 mg/dL   GFR, Estimated >47 >82 mL/min    Comment: (NOTE) Calculated using the CKD-EPI Creatinine Equation (2021)    Anion gap 7 5 - 15    Comment: Performed at [facility], [address] [place], [place] [address]  POCT CBG (Fasting - Glucose)     Status: Abnormal   Collection Time: 07/26/23  9:47 AM  Result Value Ref Range   Glucose Fasting, POC 112 (A) 70 - 99 mg/dL  POCT Urinalysis Dipstick (30865)     Status: Abnormal   Collection Time: 07/30/23  9:28 AM  Result Value Ref Range   Color, UA     Clarity, UA     Glucose, UA Positive (A) Negative   Bilirubin, UA Negative    Ketones, UA Negative    Spec Grav, UA 1.025 1.010 - 1.025   Blood, UA Negative    pH, UA 5.5 5.0 - 8.0   Protein, UA Positive (A) Negative   Urobilinogen, UA 0.2 0.2 or 1.0 E.U./dL   Nitrite, UA Negative    Leukocytes, UA Negative Negative   Appearance     Odor        Assessment & Plan:  Abdominal/pelvis CT to evaluate pain. Urine culture  Problem List Items Addressed This Visit       Other   Lower abdominal pain - Primary   Relevant Orders   CT ABDOMEN PELVIS WO CONTRAST   Other Visit Diagnoses       Flank pain       Relevant Orders   POCT Urinalysis Dipstick (78469) (Completed)   Urine  Culture       Return in about 2 weeks (around 08/13/2023) for with NK.   Total time spent: 25 minutes  [employer], NP  07/30/2023   This document may have  been prepared by Dragon Voice Recognition software and as such may include unintentional dictation errors.

## 2023-07-30 NOTE — Addendum Note (Signed)
 Addended by: Alica Antu on: 07/30/2023 11:10 AM   Modules accepted: Orders

## 2023-08-03 ENCOUNTER — Encounter

## 2023-08-03 ENCOUNTER — Other Ambulatory Visit: Payer: Self-pay | Admitting: Cardiology

## 2023-08-03 LAB — URINE CULTURE

## 2023-08-03 MED ORDER — CIPROFLOXACIN HCL 500 MG PO TABS: 500.0000 mg | ORAL_TABLET | Freq: Two times a day (BID) | ORAL | 0 refills | Status: AC

## 2023-08-03 NOTE — Progress Notes (Signed)
 Patient notified

## 2023-08-04 DIAGNOSIS — H16223 Keratoconjunctivitis sicca, not specified as Sjogren's, bilateral: Secondary | ICD-10-CM | POA: Diagnosis not present

## 2023-08-04 DIAGNOSIS — E119 Type 2 diabetes mellitus without complications: Secondary | ICD-10-CM | POA: Diagnosis not present

## 2023-08-04 DIAGNOSIS — H353132 Nonexudative age-related macular degeneration, bilateral, intermediate dry stage: Secondary | ICD-10-CM | POA: Diagnosis not present

## 2023-08-04 DIAGNOSIS — Z961 Presence of intraocular lens: Secondary | ICD-10-CM | POA: Diagnosis not present

## 2023-08-10 ENCOUNTER — Encounter: Payer: Self-pay | Admitting: Internal Medicine

## 2023-08-11 ENCOUNTER — Emergency Department
Admission: EM | Admit: 2023-08-11 | Discharge: 2023-08-11 | Disposition: A | Discharge status: Home / Self Care | Attending: Emergency Medicine | Admitting: Emergency Medicine

## 2023-08-11 ENCOUNTER — Emergency Department

## 2023-08-11 ENCOUNTER — Encounter: Payer: Self-pay | Admitting: Emergency Medicine

## 2023-08-11 ENCOUNTER — Other Ambulatory Visit: Payer: Self-pay

## 2023-08-11 DIAGNOSIS — J45909 Unspecified asthma, uncomplicated: Secondary | ICD-10-CM | POA: Diagnosis not present

## 2023-08-11 DIAGNOSIS — R404 Transient alteration of awareness: Secondary | ICD-10-CM | POA: Diagnosis not present

## 2023-08-11 DIAGNOSIS — I509 Heart failure, unspecified: Secondary | ICD-10-CM | POA: Insufficient documentation

## 2023-08-11 DIAGNOSIS — I2510 Atherosclerotic heart disease of native coronary artery without angina pectoris: Secondary | ICD-10-CM | POA: Diagnosis not present

## 2023-08-11 DIAGNOSIS — R55 Syncope and collapse: Secondary | ICD-10-CM | POA: Insufficient documentation

## 2023-08-11 DIAGNOSIS — I6782 Cerebral ischemia: Secondary | ICD-10-CM | POA: Diagnosis not present

## 2023-08-11 DIAGNOSIS — I110 Hypertensive heart disease with heart failure: Secondary | ICD-10-CM | POA: Insufficient documentation

## 2023-08-11 DIAGNOSIS — R112 Nausea with vomiting, unspecified: Secondary | ICD-10-CM | POA: Diagnosis not present

## 2023-08-11 LAB — COMPREHENSIVE METABOLIC PANEL WITH GFR
ALT: 17 U/L (ref 0–44)
AST: 22 U/L (ref 15–41)
Albumin: 3.5 g/dL (ref 3.5–5.0)
Alkaline Phosphatase: 72 U/L (ref 38–126)
Anion gap: 7 (ref 5–15)
BUN: 29 mg/dL — ABNORMAL HIGH (ref 8–23)
CO2: 25 mmol/L (ref 22–32)
Calcium: 8.6 mg/dL — ABNORMAL LOW (ref 8.9–10.3)
Chloride: 110 mmol/L (ref 98–111)
Creatinine, Ser: 0.77 mg/dL (ref 0.44–1.00)
GFR, Estimated: >60 mL/min (ref >60)
Glucose, Bld: 100 mg/dL — ABNORMAL HIGH (ref 70–99)
Potassium: 3.7 mmol/L (ref 3.5–5.1)
Sodium: 142 mmol/L (ref 135–145)
Total Bilirubin: 0.7 mg/dL (ref 0.0–1.2)
Total Protein: 6.9 g/dL (ref 6.5–8.1)

## 2023-08-11 LAB — URINALYSIS, W/ REFLEX TO CULTURE (INFECTION SUSPECTED)
Bacteria, UA: NONE SEEN
Bilirubin Urine: NEGATIVE
Glucose, UA: >=500 mg/dL — AB
Hgb urine dipstick: NEGATIVE
Ketones, ur: NEGATIVE mg/dL
Leukocytes,Ua: NEGATIVE
Nitrite: NEGATIVE
Protein, ur: NEGATIVE mg/dL
Specific Gravity, Urine: 1.033 — ABNORMAL HIGH (ref 1.005–1.030)
pH: 5 (ref 5.0–8.0)

## 2023-08-11 LAB — CBC
HCT: 36.2 % (ref 36.0–46.0)
Hemoglobin: 11.2 g/dL — ABNORMAL LOW (ref 12.0–15.0)
MCH: 23.9 pg — ABNORMAL LOW (ref 26.0–34.0)
MCHC: 30.9 g/dL (ref 30.0–36.0)
MCV: 77.2 fL — ABNORMAL LOW (ref 80.0–100.0)
Platelets: 219 10*3/uL (ref 150–400)
RBC: 4.69 MIL/uL (ref 3.87–5.11)
RDW: 17.2 % — ABNORMAL HIGH (ref 11.5–15.5)
WBC: 5.3 10*3/uL (ref 4.0–10.5)
nRBC: 0 % (ref 0.0–0.2)

## 2023-08-11 LAB — TROPONIN I (HIGH SENSITIVITY)
Troponin I (High Sensitivity): 5 ng/L (ref <18)
Troponin I (High Sensitivity): 6 ng/L (ref <18)

## 2023-08-11 MED ORDER — SODIUM CHLORIDE 0.9 % IV BOLUS
500.0000 mL | Freq: Once | INTRAVENOUS | Status: AC
  Administered 2023-08-11: 500 mL via INTRAVENOUS

## 2023-08-11 NOTE — ED Notes (Signed)
Pt ambulated to bedside commode.

## 2023-08-11 NOTE — ED Provider Notes (Signed)
 [facility] Provider Note    Event Date/Time   First MD Initiated Contact with Patient 08/11/23 1235     (approximate)   History   Near Syncope   HPI  Helen Mccormick is a 83 y.o. female with a history of hypertension, hyperlipidemia, CAD, CHF, diabetes, and asthma who presents with near syncope and vomiting while she was in her car.  The patient states that she wanted to avoid traffic and went to a dental appointment somewhat early.  She started to feel lightheaded, nauseous and weak while in the car, and then started throwing up.  She had no associated abdominal pain, bowel or bladder incontinence or diarrhea.  She then felt lightheaded and fell like she was about to pass out.  However, she did not lose consciousness.  She denies associated chest pain or difficulty breathing.  She has no fever or chills.  She states that she is feeling slightly lightheaded but much better currently.   I reviewed the past medical records.  The patient was seen in the ED on 4/27 with an episode of lightheadedness.  Workup was negative at that time.  Her most recent outpatient encounter was on 5/2 with internal medicine for abdominal pain and was ordered for CT abdomen which has not yet been completed.  Physical Exam   Triage Vital Signs: ED Triage Vitals  Encounter Vitals Group     BP 08/11/23 1057 121/61     Systolic BP Percentile --      Diastolic BP Percentile --      Pulse Rate 08/11/23 1057 64     Resp 08/11/23 1057 17     Temp 08/11/23 1057 98.1 F (36.7 C)     Temp Source 08/11/23 1057 Oral     SpO2 08/11/23 1057 95 %     Weight 08/11/23 1058 162 lb (73.5 kg)     Height 08/11/23 1058 5\' 4"  (1.626 m)     Head Circumference --      Peak Flow --      Pain Score 08/11/23 1058 0     Pain Loc --      Pain Education --      Exclude from Growth Chart --     Most recent vital signs: Vitals:   08/11/23 1057 08/11/23 1215  BP: 121/61 139/64  Pulse: 64 (!) 56   Resp: 17   Temp: 98.1 F (36.7 C)   SpO2: 95% 98%     General: Alert and oriented, no distress.  CV:  Good peripheral perfusion.  Resp:  Normal effort.  Abd:  Soft and nontender.  No distention.  Other:  Moist mucous membranes.  EOMI.  PERRLA.  No photophobia.  Normal speech.  No facial droop.  Motor intact in all extremities.  No ataxia.  No pronator drift.  No peripheral edema.   ED Results / Procedures / Treatments   Labs (all labs ordered are listed, but only abnormal results are displayed) Labs Reviewed  COMPREHENSIVE METABOLIC PANEL WITH GFR - Abnormal; Notable for the following components:      Result Value   Glucose, Bld 100 (*)    BUN 29 (*)    Calcium  8.6 (*)    All other components within normal limits  CBC - Abnormal; Notable for the following components:   Hemoglobin 11.2 (*)    MCV 77.2 (*)    MCH 23.9 (*)    RDW 17.2 (*)    All other  components within normal limits  URINALYSIS, W/ REFLEX TO CULTURE (INFECTION SUSPECTED) - Abnormal; Notable for the following components:   Color, Urine YELLOW (*)    APPearance CLEAR (*)    Specific Gravity, Urine 1.033 (*)    Glucose, UA >=500 (*)    All other components within normal limits  CBG MONITORING, ED  TROPONIN I (HIGH SENSITIVITY)  TROPONIN I (HIGH SENSITIVITY)     EKG  ED ECG REPORT I, Lind Repine, the attending physician, personally viewed and interpreted this ECG.  Date: 08/11/2023 EKG Time: 1100 Rate: 65 Rhythm: normal sinus rhythm QRS Axis: Left axis Intervals: LBBB ST/T Wave abnormalities: normal Narrative Interpretation: no evidence of acute ischemia; no significant change when compared to EKG from 07/25/2023    RADIOLOGY  CT head: I independently viewed and interpreted the images; there is no ICH.  Radiology report did not crossover to [facility] but is available in PACS and indicates the following:  IMPRESSION: No CT evidence of acute intracranial abnormality.  Chronic microvascular  ischemic changes.     PROCEDURES:  Critical Care performed: No  Procedures   MEDICATIONS ORDERED IN ED: Medications  sodium chloride  0.9 % bolus 500 mL (500 mLs Intravenous New Bag/Given 08/11/23 1300)     IMPRESSION / MDM / ASSESSMENT AND PLAN / ED COURSE  I reviewed the triage vital signs and the nursing notes.  83 year old female with PMH as noted above presents with near syncope this morning after driving to a dentist appointment.  It was associated with vomiting.  The symptoms have now resolved.  On exam the patient is overall well-appearing.  Vital signs are normal.  Physical exam is otherwise unremarkable for acute findings.  Neurologic exam is nonfocal.  Differential diagnosis includes, but is not limited to, vasovagal near-syncope, dehydration, electrolyte abnormality, other metabolic cause, gastroenteritis, gastritis, viral syndrome, less likely cardiac or CNS etiology.  Initial lab workup is reassuring.  CMP and CBC show no acute findings.  The patient is not anemic.  Urinalysis is negative for evidence of infection.  Initial troponin is negative.  We will obtain a repeat troponin, CT head, give a fluid bolus, and reassess.  Patient's presentation is most consistent with acute presentation with potential threat to life or bodily function.  The patient is on the cardiac monitor to evaluate for evidence of arrhythmia and/or significant heart rate changes.  ----------------------------------------- 3:05 PM on 08/11/2023 -----------------------------------------  CT head is negative.  Repeat troponin is negative.  The patient states he is feeling well after the fluids.  She has had no recurrence of the nausea or vomiting.  I did consider whether she may benefit from inpatient admission given her age and comorbidities, and I offered this to the patient.  However, given her stable vital signs, resolved symptoms, and the reassuring workup, I think she is also appropriate for  discharge.  The patient states she feels well to go home.  She is stable for discharge at this time.  Return precautions given, she expresses understanding.   FINAL CLINICAL IMPRESSION(S) / ED DIAGNOSES   Final diagnoses:  Near syncope     Rx / DC Orders   ED Discharge Orders     None        Note:  This document was prepared using Dragon voice recognition software and may include unintentional dictation errors.    Lind Repine, MD 08/11/23 1536

## 2023-08-11 NOTE — ED Triage Notes (Addendum)
 Patient to ED via [facility] from dentist office. Dentist office reports pt had appointment at 2pm but got there early to "beat the traffic." Pt c/o near syncope with nausea and vomiting. Hx HTN. Pt reports she still feels "woozy" in the head. AOx4  VS WNL Cbg 99

## 2023-08-11 NOTE — Discharge Instructions (Addendum)
 Take your regular medications as prescribed.  Follow-up with your primary care provider.  Return to the ER for new, worsening, or persistent severe weakness, dizziness, lightheadedness, any recurrent episodes of feeling acute going to pass out, vomiting, or any other new or worsening symptoms that concern you.

## 2023-08-12 ENCOUNTER — Ambulatory Visit: Payer: 59 | Admitting: Cardiovascular Disease

## 2023-08-13 ENCOUNTER — Ambulatory Visit: Admitting: Internal Medicine

## 2023-08-14 ENCOUNTER — Other Ambulatory Visit: Payer: Self-pay | Admitting: Internal Medicine

## 2023-08-21 ENCOUNTER — Other Ambulatory Visit: Payer: Self-pay | Admitting: Internal Medicine

## 2023-08-21 DIAGNOSIS — K219 Gastro-esophageal reflux disease without esophagitis: Secondary | ICD-10-CM

## 2023-08-27 DIAGNOSIS — E039 Hypothyroidism, unspecified: Secondary | ICD-10-CM | POA: Diagnosis not present

## 2023-08-27 DIAGNOSIS — E210 Primary hyperparathyroidism: Secondary | ICD-10-CM | POA: Diagnosis not present

## 2023-08-27 DIAGNOSIS — M810 Age-related osteoporosis without current pathological fracture: Secondary | ICD-10-CM | POA: Diagnosis not present

## 2023-08-27 DIAGNOSIS — Z8781 Personal history of (healed) traumatic fracture: Secondary | ICD-10-CM | POA: Diagnosis not present

## 2023-09-02 ENCOUNTER — Ambulatory Visit
Admission: RE | Admit: 2023-09-02 | Discharge: 2023-09-02 | Disposition: A | Discharge status: Home / Self Care | Source: Ambulatory Visit | Attending: Internal Medicine | Admitting: Internal Medicine

## 2023-09-02 DIAGNOSIS — Z1231 Encounter for screening mammogram for malignant neoplasm of breast: Secondary | ICD-10-CM | POA: Insufficient documentation

## 2023-09-08 DIAGNOSIS — M542 Cervicalgia: Secondary | ICD-10-CM | POA: Diagnosis not present

## 2023-09-08 DIAGNOSIS — M5010 Cervical disc disorder with radiculopathy, unspecified cervical region: Secondary | ICD-10-CM | POA: Diagnosis not present

## 2023-09-09 ENCOUNTER — Ambulatory Visit: Payer: Self-pay | Admitting: Internal Medicine

## 2023-09-14 DIAGNOSIS — M5441 Lumbago with sciatica, right side: Secondary | ICD-10-CM | POA: Diagnosis not present

## 2023-09-14 DIAGNOSIS — M5412 Radiculopathy, cervical region: Secondary | ICD-10-CM | POA: Diagnosis not present

## 2023-09-14 DIAGNOSIS — M25512 Pain in left shoulder: Secondary | ICD-10-CM | POA: Diagnosis not present

## 2023-09-14 DIAGNOSIS — M25511 Pain in right shoulder: Secondary | ICD-10-CM | POA: Diagnosis not present

## 2023-09-14 DIAGNOSIS — M542 Cervicalgia: Secondary | ICD-10-CM | POA: Diagnosis not present

## 2023-09-14 DIAGNOSIS — G8929 Other chronic pain: Secondary | ICD-10-CM | POA: Diagnosis not present

## 2023-09-14 DIAGNOSIS — M5416 Radiculopathy, lumbar region: Secondary | ICD-10-CM | POA: Diagnosis not present

## 2023-09-15 DIAGNOSIS — Z711 Person with feared health complaint in whom no diagnosis is made: Secondary | ICD-10-CM | POA: Diagnosis not present

## 2023-09-15 DIAGNOSIS — M1990 Unspecified osteoarthritis, unspecified site: Secondary | ICD-10-CM | POA: Diagnosis not present

## 2023-09-18 DIAGNOSIS — Z9109 Other allergy status, other than to drugs and biological substances: Secondary | ICD-10-CM | POA: Diagnosis not present

## 2023-09-21 DIAGNOSIS — L299 Pruritus, unspecified: Secondary | ICD-10-CM | POA: Diagnosis not present

## 2023-09-25 ENCOUNTER — Encounter (HOSPITAL_COMMUNITY): Payer: Self-pay | Admitting: Interventional Radiology

## 2023-09-30 ENCOUNTER — Ambulatory Visit: Admitting: Cardiovascular Disease

## 2023-10-07 ENCOUNTER — Observation Stay
Admission: EM | Admit: 2023-10-07 | Discharge: 2023-10-08 | Disposition: A | Discharge status: Home / Self Care | Attending: Internal Medicine | Admitting: Internal Medicine

## 2023-10-07 ENCOUNTER — Other Ambulatory Visit: Payer: Self-pay

## 2023-10-07 ENCOUNTER — Emergency Department

## 2023-10-07 DIAGNOSIS — M549 Dorsalgia, unspecified: Secondary | ICD-10-CM | POA: Diagnosis not present

## 2023-10-07 DIAGNOSIS — R55 Syncope and collapse: Secondary | ICD-10-CM | POA: Diagnosis not present

## 2023-10-07 DIAGNOSIS — I428 Other cardiomyopathies: Secondary | ICD-10-CM | POA: Diagnosis not present

## 2023-10-07 DIAGNOSIS — I7411 Embolism and thrombosis of thoracic aorta: Secondary | ICD-10-CM | POA: Insufficient documentation

## 2023-10-07 DIAGNOSIS — M545 Low back pain, unspecified: Secondary | ICD-10-CM | POA: Diagnosis not present

## 2023-10-07 DIAGNOSIS — I700 Atherosclerosis of aorta: Secondary | ICD-10-CM | POA: Diagnosis not present

## 2023-10-07 DIAGNOSIS — E782 Mixed hyperlipidemia: Secondary | ICD-10-CM | POA: Diagnosis present

## 2023-10-07 DIAGNOSIS — E119 Type 2 diabetes mellitus without complications: Secondary | ICD-10-CM | POA: Insufficient documentation

## 2023-10-07 DIAGNOSIS — E1159 Type 2 diabetes mellitus with other circulatory complications: Secondary | ICD-10-CM | POA: Diagnosis present

## 2023-10-07 DIAGNOSIS — I110 Hypertensive heart disease with heart failure: Secondary | ICD-10-CM | POA: Insufficient documentation

## 2023-10-07 DIAGNOSIS — I25118 Atherosclerotic heart disease of native coronary artery with other forms of angina pectoris: Secondary | ICD-10-CM | POA: Diagnosis present

## 2023-10-07 DIAGNOSIS — M546 Pain in thoracic spine: Secondary | ICD-10-CM | POA: Diagnosis not present

## 2023-10-07 DIAGNOSIS — I509 Heart failure, unspecified: Secondary | ICD-10-CM | POA: Diagnosis not present

## 2023-10-07 DIAGNOSIS — J4520 Mild intermittent asthma, uncomplicated: Secondary | ICD-10-CM | POA: Diagnosis present

## 2023-10-07 DIAGNOSIS — E890 Postprocedural hypothyroidism: Secondary | ICD-10-CM | POA: Diagnosis present

## 2023-10-07 DIAGNOSIS — Z79899 Other long term (current) drug therapy: Secondary | ICD-10-CM | POA: Diagnosis not present

## 2023-10-07 DIAGNOSIS — Z743 Need for continuous supervision: Secondary | ICD-10-CM | POA: Diagnosis not present

## 2023-10-07 DIAGNOSIS — F32A Depression, unspecified: Secondary | ICD-10-CM | POA: Diagnosis present

## 2023-10-07 DIAGNOSIS — G4733 Obstructive sleep apnea (adult) (pediatric): Secondary | ICD-10-CM | POA: Diagnosis not present

## 2023-10-07 DIAGNOSIS — I672 Cerebral atherosclerosis: Secondary | ICD-10-CM | POA: Diagnosis not present

## 2023-10-07 DIAGNOSIS — A419 Sepsis, unspecified organism: Secondary | ICD-10-CM | POA: Diagnosis not present

## 2023-10-07 DIAGNOSIS — E1169 Type 2 diabetes mellitus with other specified complication: Secondary | ICD-10-CM | POA: Diagnosis present

## 2023-10-07 DIAGNOSIS — I152 Hypertension secondary to endocrine disorders: Secondary | ICD-10-CM | POA: Diagnosis present

## 2023-10-07 DIAGNOSIS — K5710 Diverticulosis of small intestine without perforation or abscess without bleeding: Secondary | ICD-10-CM | POA: Diagnosis not present

## 2023-10-07 DIAGNOSIS — K429 Umbilical hernia without obstruction or gangrene: Secondary | ICD-10-CM | POA: Diagnosis not present

## 2023-10-07 DIAGNOSIS — R918 Other nonspecific abnormal finding of lung field: Secondary | ICD-10-CM | POA: Diagnosis not present

## 2023-10-07 DIAGNOSIS — I2510 Atherosclerotic heart disease of native coronary artery without angina pectoris: Secondary | ICD-10-CM | POA: Diagnosis not present

## 2023-10-07 LAB — TROPONIN I (HIGH SENSITIVITY)
Troponin I (High Sensitivity): 6 ng/L (ref <18)
Troponin I (High Sensitivity): 6 ng/L (ref <18)

## 2023-10-07 LAB — CBC WITH DIFFERENTIAL/PLATELET
Abs Immature Granulocytes: 0.02 K/uL (ref 0.00–0.07)
Basophils Absolute: 0 K/uL (ref 0.0–0.1)
Basophils Relative: 0 %
Eosinophils Absolute: 0.1 K/uL (ref 0.0–0.5)
Eosinophils Relative: 2 %
HCT: 36.3 % (ref 36.0–46.0)
Hemoglobin: 11.5 g/dL — ABNORMAL LOW (ref 12.0–15.0)
Immature Granulocytes: 0 %
Lymphocytes Relative: 51 %
Lymphs Abs: 3.2 K/uL (ref 0.7–4.0)
MCH: 24.6 pg — ABNORMAL LOW (ref 26.0–34.0)
MCHC: 31.7 g/dL (ref 30.0–36.0)
MCV: 77.6 fL — ABNORMAL LOW (ref 80.0–100.0)
Monocytes Absolute: 0.3 K/uL (ref 0.1–1.0)
Monocytes Relative: 5 %
Neutro Abs: 2.6 K/uL (ref 1.7–7.7)
Neutrophils Relative %: 42 %
Platelets: 192 K/uL (ref 150–400)
RBC: 4.68 MIL/uL (ref 3.87–5.11)
RDW: 17.3 % — ABNORMAL HIGH (ref 11.5–15.5)
WBC: 6.3 K/uL (ref 4.0–10.5)
nRBC: 0 % (ref 0.0–0.2)

## 2023-10-07 LAB — COMPREHENSIVE METABOLIC PANEL WITH GFR
ALT: 15 U/L (ref 0–44)
AST: 21 U/L (ref 15–41)
Albumin: 3.5 g/dL (ref 3.5–5.0)
Alkaline Phosphatase: 69 U/L (ref 38–126)
Anion gap: 12 (ref 5–15)
BUN: 29 mg/dL — ABNORMAL HIGH (ref 8–23)
CO2: 22 mmol/L (ref 22–32)
Calcium: 9.2 mg/dL (ref 8.9–10.3)
Chloride: 108 mmol/L (ref 98–111)
Creatinine, Ser: 0.99 mg/dL (ref 0.44–1.00)
GFR, Estimated: 57 mL/min — ABNORMAL LOW (ref >60)
Glucose, Bld: 120 mg/dL — ABNORMAL HIGH (ref 70–99)
Potassium: 4 mmol/L (ref 3.5–5.1)
Sodium: 142 mmol/L (ref 135–145)
Total Bilirubin: 0.7 mg/dL (ref 0.0–1.2)
Total Protein: 6.4 g/dL — ABNORMAL LOW (ref 6.5–8.1)

## 2023-10-07 LAB — URINALYSIS, W/ REFLEX TO CULTURE (INFECTION SUSPECTED)
Bacteria, UA: NONE SEEN
Bilirubin Urine: NEGATIVE
Glucose, UA: >=500 mg/dL — AB
Hgb urine dipstick: NEGATIVE
Ketones, ur: NEGATIVE mg/dL
Leukocytes,Ua: NEGATIVE
Nitrite: NEGATIVE
Protein, ur: NEGATIVE mg/dL
Specific Gravity, Urine: 1.024 (ref 1.005–1.030)
pH: 5 (ref 5.0–8.0)

## 2023-10-07 LAB — LACTIC ACID, PLASMA: Lactic Acid, Venous: 1.7 mmol/L (ref 0.5–1.9)

## 2023-10-07 LAB — LIPASE, BLOOD: Lipase: 26 U/L (ref 11–51)

## 2023-10-07 LAB — PROTIME-INR
INR: 1 (ref 0.8–1.2)
Prothrombin Time: 13.7 s (ref 11.4–15.2)

## 2023-10-07 MED ORDER — ALBUTEROL SULFATE (2.5 MG/3ML) 0.083% IN NEBU: 2.5000 mg | INHALATION_SOLUTION | Freq: Four times a day (QID) | RESPIRATORY_TRACT | Status: DC | PRN

## 2023-10-07 MED ORDER — ACETAMINOPHEN 650 MG RE SUPP: 650.0000 mg | Freq: Four times a day (QID) | RECTAL | Status: DC | PRN

## 2023-10-07 MED ORDER — ONDANSETRON HCL 4 MG/2ML IJ SOLN
INTRAMUSCULAR | Status: AC
  Filled 2023-10-07: qty 2

## 2023-10-07 MED ORDER — ACETAMINOPHEN 325 MG PO TABS: 650.0000 mg | ORAL_TABLET | Freq: Four times a day (QID) | ORAL | Status: DC | PRN

## 2023-10-07 MED ORDER — ONDANSETRON HCL 4 MG PO TABS
4.0000 mg | ORAL_TABLET | Freq: Four times a day (QID) | ORAL | Status: DC | PRN
Start: 2023-10-07 — End: 2023-10-08

## 2023-10-07 MED ORDER — VITAMIN D 25 MCG (1000 UNIT) PO TABS
1000.0000 [IU] | ORAL_TABLET | Freq: Every day | ORAL | Status: DC
  Administered 2023-10-08: 1000 [IU] via ORAL
  Filled 2023-10-07: qty 1

## 2023-10-07 MED ORDER — POLYETHYLENE GLYCOL 3350 17 GM/SCOOP PO POWD: 17.0000 g | Freq: Every day | ORAL | Status: DC | PRN

## 2023-10-07 MED ORDER — SACUBITRIL-VALSARTAN 97-103 MG PO TABS
1.0000 | ORAL_TABLET | Freq: Two times a day (BID) | ORAL | Status: DC
Start: 2023-10-07 — End: 2023-10-08
  Administered 2023-10-08: 1 via ORAL
  Filled 2023-10-07 (×3): qty 1

## 2023-10-07 MED ORDER — GABAPENTIN 100 MG PO CAPS
100.0000 mg | ORAL_CAPSULE | Freq: Three times a day (TID) | ORAL | Status: DC
Start: 2023-10-07 — End: 2023-10-08
  Administered 2023-10-07 – 2023-10-08 (×3): 100 mg via ORAL
  Filled 2023-10-07 (×3): qty 1

## 2023-10-07 MED ORDER — HEPARIN SODIUM (PORCINE) 5000 UNIT/ML IJ SOLN
5000.0000 [IU] | Freq: Three times a day (TID) | INTRAMUSCULAR | Status: DC
Start: 2023-10-07 — End: 2023-10-08
  Administered 2023-10-07 – 2023-10-08 (×3): 5000 [IU] via SUBCUTANEOUS
  Filled 2023-10-07 (×3): qty 1

## 2023-10-07 MED ORDER — ESCITALOPRAM OXALATE 10 MG PO TABS
10.0000 mg | ORAL_TABLET | Freq: Every day | ORAL | Status: DC
  Administered 2023-10-08: 10 mg via ORAL
  Filled 2023-10-07: qty 1

## 2023-10-07 MED ORDER — PANTOPRAZOLE SODIUM 40 MG PO TBEC
40.0000 mg | DELAYED_RELEASE_TABLET | Freq: Every day | ORAL | Status: DC
  Administered 2023-10-08: 40 mg via ORAL
  Filled 2023-10-07: qty 1

## 2023-10-07 MED ORDER — AMLODIPINE BESYLATE 5 MG PO TABS
5.0000 mg | ORAL_TABLET | Freq: Every day | ORAL | Status: DC
  Administered 2023-10-08: 5 mg via ORAL
  Filled 2023-10-07: qty 1

## 2023-10-07 MED ORDER — CARVEDILOL 6.25 MG PO TABS
12.5000 mg | ORAL_TABLET | Freq: Two times a day (BID) | ORAL | Status: DC
  Administered 2023-10-07 – 2023-10-08 (×2): 12.5 mg via ORAL
  Filled 2023-10-07 (×2): qty 2

## 2023-10-07 MED ORDER — SODIUM CHLORIDE 0.9 % IV BOLUS
1000.0000 mL | Freq: Once | INTRAVENOUS | Status: AC
  Administered 2023-10-07: 1000 mL via INTRAVENOUS

## 2023-10-07 MED ORDER — BUDESONIDE 0.25 MG/2ML IN SUSP
0.2500 mg | Freq: Two times a day (BID) | RESPIRATORY_TRACT | Status: DC
  Administered 2023-10-07 – 2023-10-08 (×2): 0.25 mg via RESPIRATORY_TRACT
  Filled 2023-10-07 (×2): qty 2

## 2023-10-07 MED ORDER — LEVOTHYROXINE SODIUM 50 MCG PO TABS
75.0000 ug | ORAL_TABLET | Freq: Every day | ORAL | Status: DC
  Administered 2023-10-08: 75 ug via ORAL
  Filled 2023-10-07: qty 1

## 2023-10-07 MED ORDER — ROSUVASTATIN CALCIUM 20 MG PO TABS
40.0000 mg | ORAL_TABLET | Freq: Every day | ORAL | Status: DC
  Administered 2023-10-08: 40 mg via ORAL
  Filled 2023-10-07: qty 2

## 2023-10-07 MED ORDER — ISOSORBIDE DINITRATE 30 MG PO TABS
30.0000 mg | ORAL_TABLET | Freq: Every day | ORAL | Status: DC
  Administered 2023-10-08: 30 mg via ORAL
  Filled 2023-10-07: qty 1

## 2023-10-07 MED ORDER — CYCLOSPORINE 0.05 % OP EMUL
1.0000 [drp] | Freq: Every morning | OPHTHALMIC | Status: DC
  Administered 2023-10-08: 1 [drp] via OPHTHALMIC
  Filled 2023-10-07: qty 30

## 2023-10-07 MED ORDER — IOHEXOL 350 MG/ML SOLN
100.0000 mL | Freq: Once | INTRAVENOUS | Status: AC | PRN
  Administered 2023-10-07: 100 mL via INTRAVENOUS

## 2023-10-07 MED ORDER — SODIUM CHLORIDE 0.9% FLUSH
3.0000 mL | Freq: Two times a day (BID) | INTRAVENOUS | Status: DC
  Administered 2023-10-07 – 2023-10-08 (×3): 3 mL via INTRAVENOUS

## 2023-10-07 MED ORDER — ONDANSETRON HCL 4 MG/2ML IJ SOLN
4.0000 mg | Freq: Four times a day (QID) | INTRAMUSCULAR | Status: DC | PRN
Start: 2023-10-07 — End: 2023-10-08

## 2023-10-07 MED ORDER — SENNOSIDES-DOCUSATE SODIUM 8.6-50 MG PO TABS: 1.0000 | ORAL_TABLET | Freq: Every evening | ORAL | Status: DC | PRN

## 2023-10-07 MED ORDER — NITROGLYCERIN 0.4 MG SL SUBL: 0.4000 mg | SUBLINGUAL_TABLET | SUBLINGUAL | Status: DC | PRN

## 2023-10-07 NOTE — Assessment & Plan Note (Addendum)
 Versus seizure Fall precaution Complete echo ordered MRI of the brain without contrast EEG has been ordered on admission Pending MRI, EEG, a.m. team to consider consultation to neurology Pending complete echo, a.m. team to consider consultation to cardiology If the above workup is negative, a.m. team to consider discharging patient with home Holter monitor for monitoring of disarrhythmia capture

## 2023-10-07 NOTE — Assessment & Plan Note (Addendum)
Home levothyroxine 75 mcg daily resumed

## 2023-10-07 NOTE — ED Notes (Signed)
 Collected urine from pt at this time. Sent pt urine off at this time.

## 2023-10-07 NOTE — ED Triage Notes (Signed)
 Pt to ED via [facility] from home. Pt states she is hurting in my spine. Pain began this morning. Alert and oriented. NAD.

## 2023-10-07 NOTE — ED Notes (Signed)
 Pt given warm blanket at this time. Applied hospital socks on pt at this time. Put pt back on the monitor at this time.

## 2023-10-07 NOTE — Assessment & Plan Note (Addendum)
 Home carvedilol  12.5 mg p.o. twice daily, Isordil  30 mg daily, Entresto  97-103 mg twice daily were resumed on admission

## 2023-10-07 NOTE — ED Notes (Addendum)
 This RN was completing triage, noted pt to be hypotensive, prepared to place IV when pt began falling to the side of wheelchair and became unresponsive. First nurse Rosina made aware. Pt moved immediately to room. Pt became diaphoretic and had episode of emesis once in room. Ray, MD, to bedside.

## 2023-10-07 NOTE — Assessment & Plan Note (Signed)
 As needed lidocaine  patch

## 2023-10-07 NOTE — ED Notes (Signed)
 Patient called out needing to use the restroom. Patient unhooked from monitoring to go to the restroom. Patient advised to call out when back in bed in order to be hooked back to monitoring. Patient is steady on her feet with walking and is being allowed to ambulate unassisted.

## 2023-10-07 NOTE — ED Triage Notes (Incomplete)
 First Nurse Note:  Pt via [facility] from home. Pt c/o back pain for the past 4 months, reports that she had surgery on her c-spine 4 months ago. Denies any injury recently states pain is worse today. Pt is A&Ox4 and NAD  133/69 96% on RA 76 HR

## 2023-10-07 NOTE — ED Notes (Signed)
 Patient called out requesting the tv remote.

## 2023-10-07 NOTE — Assessment & Plan Note (Signed)
 Albuterol  nebulizer 2.5, every 6 hours as needed for shortness of breath, wheezing, 3 days ordered

## 2023-10-07 NOTE — H&P (Addendum)
 History and Physical   Helen Mccormick FMW:[id number] DOB: Jun 09, 1940 DOA: 10/07/2023  PCP: Fernand Fredy RAMAN, MD  Patient coming from: home via EMS  I have personally briefly reviewed patient's old medical records in [facility] EMR.  Chief Concern: syncope  HPI: [name] is an 83 year old female with history of hypertension, hypothyroid, non-insulin -dependent diabetes mellitus, hyperlipidemia, depression, hypertension, who presents emergency department for chief concerns of back pain.    While in the ED, patient experienced a witnessed syncopal event.  Vitals in the ED showed T of 98.6, rr 16, heart rate 68, blood pressure 75/45, SpO2 95% on room air.  Serum sodium is 142, potassium 4.0, chloride 108, bicarb 22, BUN of 29, serum creatinine 0.99, EGFR 57, nonfasting blood glucose 120, WBC 6.3, hemoglobin 11.5, platelets of 192.  Lactic acid is 1.7.  High since troponin is 6.  ED treatment: Ondansetron  4 mg IM one-time dose, NS 1 L bolus. ------------------------------------- At bedside, patient able to tell me her first and last name, age, location, current calendar year.  She reports that over the last month she has had 2-3 syncopal events at home.  These were witnessed by her sister.  She reports that she loses consciousness during these times.  Patient reports she gets prodromal dizziness and then does not know what is going on.    She denies trauma to her person during these episodes.  She denies chest pain, shortness of breath, dysuria, hematuria, diarrhea, nausea, vomiting, swelling of her lower extremities.  She reports before 1 month ago, these episodes did not happen.  She reports the first 2 episodes, she was outside in the hot sun.  However today, she was not outside in the hot sun.  She was in the ED triage.  Social history: She lives at home with her sister.  She is a former tobacco user, quitting many years ago.  She denies EtOH and recreational drug use.  She is  retired and previously worked in a mill.  ROS: Constitutional: no weight change, no fever ENT/Mouth: no sore throat, no rhinorrhea Eyes: no eye pain, no vision changes Cardiovascular: no chest pain, no dyspnea,  no edema, no palpitations Respiratory: no cough, no sputum, no wheezing Gastrointestinal: no nausea, no vomiting, no diarrhea, no constipation Genitourinary: no urinary incontinence, no dysuria, no hematuria Musculoskeletal: no arthralgias, no myalgias Skin: no skin lesions, no pruritus, Neuro: + weakness, + loss of consciousness, + syncope Psych: no anxiety, no depression, no decrease appetite Heme/Lymph: no bruising, no bleeding  ED Course: Discussed with EDP, patient requiring hospitalization for chief concerns of syncope workup.  Assessment/Plan  Principal Problem:   Syncope Active Problems:   Hypertension associated with diabetes (HCC)   Depression   Hypothyroidism, postsurgical   Hyperlipidemia due to type 2 diabetes mellitus (HCC)   Obstructive sleep apnea syndrome   Nonischemic cardiomyopathy (HCC)   Mild intermittent asthma   Mixed hyperlipidemia   Coronary artery disease of native artery of native heart with stable angina pectoris (HCC)   Back pain   Assessment and Plan:  * Syncope Versus seizure Fall precaution Complete echo ordered MRI of the brain without contrast EEG has been ordered on admission Pending MRI, EEG, a.m. team to consider consultation to neurology Pending complete echo, a.m. team to consider consultation to cardiology If the above workup is negative, a.m. team to consider discharging patient with home Holter monitor for monitoring of disarrhythmia capture  # Infrarenal aorta narrowing suspect intraluminal thrombus versus hematoma -  Appears to be asymptomatic at this time -Given there is a possible hematoma, I did not initiate anticoagulation -AM team to consult vascular surgery versus outpatient referral for further recommendation  and management  Depression Escitalopram  10 mg daily resumed  Back pain As needed lidocaine  patch  Mixed hyperlipidemia Home rosuvastatin  40 mg daily resumed  Mild intermittent asthma Albuterol  nebulizer 2.5, every 6 hours as needed for shortness of breath, wheezing, 3 days ordered  Nonischemic cardiomyopathy (HCC) Home carvedilol  12.5 mg p.o. twice daily, Isordil  30 mg daily, Entresto  97-103 mg twice daily were resumed on admission  Hypothyroidism, postsurgical Home levothyroxine  75 mcg daily resumed  Chart reviewed.   DVT prophylaxis: Heparin  5000 units subcutaneous every 8 hours Code Status: Full code Diet: Heart healthy/carb modified Family Communication: No Disposition Plan: Pending clinical course Consults called: None at this time.  Pending MRI, EEG, a.m. team to consider neurology consultation Admission status: Telemetry cardiac, observation  Past Medical History:  Diagnosis Date   Allergy    Anxiety    CHF (congestive heart failure) (HCC)    Colon polyp    Depression    GERD (gastroesophageal reflux disease)    Heart murmur    Hyperlipidemia    Hypertension    Hypothyroidism    Sleep apnea    Thyroid  disease    Past Surgical History:  Procedure Laterality Date   ABDOMINAL HYSTERECTOMY     BACK SURGERY     BACK SURGERY     CERVICAL FUSION     COLONOSCOPY WITH PROPOFOL  N/A 05/28/2017   Procedure: COLONOSCOPY WITH PROPOFOL ;  Surgeon: Therisa Bi, MD;  Location: [facility] ENDOSCOPY;  Service: Gastroenterology;  Laterality: N/A;   COLONOSCOPY WITH PROPOFOL  N/A 11/17/2019   Procedure: COLONOSCOPY WITH PROPOFOL ;  Surgeon: Maryruth Ole DASEN, MD;  Location: [facility] ENDOSCOPY;  Service: Endoscopy;  Laterality: N/A;   COLONOSCOPY WITH PROPOFOL  N/A 04/20/2023   Procedure: COLONOSCOPY WITH PROPOFOL ;  Surgeon: Therisa Bi, MD;  Location: [facility] ENDOSCOPY;  Service: Gastroenterology;  Laterality: N/A;   ESOPHAGOGASTRODUODENOSCOPY (EGD) WITH PROPOFOL  N/A 11/17/2019    Procedure: ESOPHAGOGASTRODUODENOSCOPY (EGD) WITH PROPOFOL ;  Surgeon: Maryruth Ole DASEN, MD;  Location: [facility] ENDOSCOPY;  Service: Endoscopy;  Laterality: N/A;   ESOPHAGOGASTRODUODENOSCOPY (EGD) WITH PROPOFOL  N/A 04/20/2023   Procedure: ESOPHAGOGASTRODUODENOSCOPY (EGD) WITH PROPOFOL ;  Surgeon: Therisa Bi, MD;  Location: [facility] ENDOSCOPY;  Service: Gastroenterology;  Laterality: N/A;   IR KYPHO THORACIC WITH BONE BIOPSY  10/20/2022   IR RADIOLOGIST EVAL & MGMT  09/22/2022   NECK SURGERY     POLYPECTOMY  04/20/2023   Procedure: POLYPECTOMY;  Surgeon: Therisa Bi, MD;  Location: [facility] ENDOSCOPY;  Service: Gastroenterology;;   THYROID  SURGERY     THYROID  SURGERY     Social History:  reports that she has never smoked. She has quit using smokeless tobacco.  Her smokeless tobacco use included chew. She reports that she does not drink alcohol and does not use drugs.  Allergies  Allergen Reactions   Amlodipine  Hives and Swelling   Tramadol  Other (See Comments)    Dizziness    Isosorbide  Dinitrate     Other Reaction(s): Headache   Codeine Nausea Only   Etodolac Other (See Comments)    Other reaction(s): Abdominal Pain   Family History  Problem Relation Age of Onset   Breast cancer Maternal Aunt 29   Diabetes Mother    Leukemia Mother    Heart Problems Mother    Diabetes Sister    Mental illness Sister    Colon polyps  Sister    Stroke Father    Cancer Brother        rectal   Healthy Daughter    Hypertension Son    Diabetes Sister    Thyroid  disease Sister    Healthy Sister    Healthy Sister    Stroke Brother    Healthy Son    Healthy Son    Healthy Daughter    Family history: Family history reviewed and not pertinent.  Prior to Admission medications   Medication Sig Start Date End Date Taking? Authorizing Provider  Accu-Chek Softclix Lancets lancets Use as instructed 02/19/23   Fernand Fredy RAMAN, MD  albuterol  (VENTOLIN  HFA) 108 [phone] Base) MCG/ACT inhaler Inhale 2 puffs by mouth every  6 hours as needed for wheezing/shortness of breath 03/25/23   Fernand Fredy RAMAN, MD  amLODipine  (NORVASC ) 5 MG tablet Take 5 mg by mouth daily. 05/26/23   [provider]  ARNUITY ELLIPTA  100 MCG/ACT AEPB Inhale 1 puff by mouth every day 03/25/23   Khan, Neelam S, MD  Azelastine HCl 137 MCG/SPRAY SOLN Use 1 spray into each nostril every day as directed 06/25/23 09/23/23  Fernand Fredy RAMAN, MD  Blood Glucose Monitoring Suppl (ACCU-CHEK GUIDE) w/Device KIT See admin instructions. Patient not taking: Reported on 07/26/2023 11/28/20   [provider]  busPIRone (BUSPAR) 15 MG tablet Take 15 mg by mouth 2 (two) times daily. Patient not taking: Reported on 07/26/2023 01/29/23   [provider]  carvedilol  (COREG ) 12.5 MG tablet Take 1 tablet by mouth twice daily 05/26/23   Scoggins, [facility], NP  Cholecalciferol  (D3) 25 MCG (1000 UT) capsule Take 1,000 Units by mouth daily.    [provider]  ENTRESTO  97-103 MG Take 1 tablet by mouth twice daily 04/26/23   Fernand Denyse LABOR, MD  escitalopram  (LEXAPRO ) 10 MG tablet Take 1 tablet by mouth every day 05/17/23   Fernand Fredy RAMAN, MD  fluticasone  (FLOVENT  HFA) 110 MCG/ACT inhaler Inhale 1 puff into the lungs 2 (two) times daily. Patient not taking: Reported on 07/26/2023    [provider]  furosemide  (LASIX ) 20 MG tablet Take 20 mg by mouth daily. 05/24/23   [provider]  gabapentin  (NEURONTIN ) 100 MG capsule Take 1 capsule by mouth 3 times a day 08/16/23   Fernand Fredy RAMAN, MD  isosorbide  dinitrate (ISORDIL ) 30 MG tablet Take 1 tablet by mouth every day 05/17/23   Fernand Fredy RAMAN, MD  JARDIANCE  25 MG TABS tablet Take 1 tablet by mouth every day 06/07/23   Fernand Fredy RAMAN, MD  levothyroxine  (SYNTHROID ) 75 MCG tablet Take 1 tablet (75 mcg total) by mouth daily. 01/01/23 01/01/24  Fernand Fredy RAMAN, MD  meclizine  (ANTIVERT ) 12.5 MG tablet Take 1 tablet (12.5 mg total) by mouth 3 (three) times daily as needed for dizziness. Patient not  taking: Reported on 07/26/2023 10/13/22   Fernand Fredy RAMAN, MD  Multiple Vitamin (MULTIVITAMIN) tablet Take 1 tablet by mouth daily.    [provider]  nitroGLYCERIN  (NITROSTAT ) 0.4 MG SL tablet Place 1 tablet (0.4 mg total) under the tongue every 5 (five) minutes as needed for chest pain. Patient not taking: Reported on 06/15/2023 08/20/17   Nyle Tinnie Fuller, NP  [facility] VERIO test strip USE TO CHECK BLOOD SUGAR TWICE DAILY 02/16/23   Fernand Fredy RAMAN, MD  oxycodone  (OXY-IR) 5 MG capsule Take 5 mg by mouth every 4 (four) hours as needed.    [provider]  pantoprazole  (PROTONIX ) 40  MG tablet Take 1 tablet by mouth every day 08/23/23   Fernand Fredy RAMAN, MD  rosuvastatin  (CRESTOR ) 40 MG tablet Take 1 tablet by mouth every day 08/16/23   Fernand Fredy RAMAN, MD   Physical Exam: Vitals:   10/07/23 1500 10/07/23 1530 10/07/23 1551 10/07/23 1902  BP: (!) 126/57 (!) 133/57  137/66  Pulse: (!) 52 (!) 55  (!) 56  Resp:    19  Temp:   97.7 F (36.5 C) 97.8 F (36.6 C)  TempSrc:   Oral Oral  SpO2: 95% 94%  100%  Weight:      Height:       Constitutional: appears age-appropriate, NAD, calm Eyes: PERRL, lids and conjunctivae normal ENMT: Mucous membranes are moist. Posterior pharynx clear of any exudate or lesions. Age-appropriate dentition. Hearing appropriate Neck: normal, supple, no masses, no thyromegaly Respiratory: clear to auscultation bilaterally, no wheezing, no crackles. Normal respiratory effort. No accessory muscle use.  Cardiovascular: Regular rate and rhythm, no murmurs / rubs / gallops. No extremity edema. 2+ pedal pulses. No carotid bruits.  Abdomen: no tenderness, no masses palpated, no hepatosplenomegaly. Bowel sounds positive.  Musculoskeletal: no clubbing / cyanosis. No joint deformity upper and lower extremities. Good ROM, no contractures, no atrophy. Normal muscle tone.  Skin: no rashes, lesions, ulcers. No induration Neurologic: Sensation intact. Strength 5/5 in  all 4.  Psychiatric: Normal judgment and insight. Alert and oriented x 3. Normal mood.   EKG: independently reviewed, showing sinus rhythm with rate of 61, QTc 516  Chest x-ray on Admission: I personally reviewed and I agree with radiologist reading as below.  CT Head Wo Contrast Result Date: 10/07/2023 CLINICAL DATA:  Mental status change, unknown cause. EXAM: CT HEAD WITHOUT CONTRAST TECHNIQUE: Contiguous axial images were obtained from the base of the skull through the vertex without intravenous contrast. RADIATION DOSE REDUCTION: This exam was performed according to the departmental dose-optimization program which includes automated exposure control, adjustment of the mA and/or kV according to patient size and/or use of iterative reconstruction technique. COMPARISON:  CT scan head from 08/11/2023. FINDINGS: Brain: No evidence of acute infarction, hemorrhage, hydrocephalus, extra-axial collection or mass lesion/mass effect. There is bilateral periventricular hypodensity, which is non-specific but most likely seen in the settings of microvascular ischemic changes. Moderate in extent. Otherwise normal appearance of brain parenchyma. Ventricles are normal. Cerebral volume is age appropriate. Vascular: No hyperdense vessel or unexpected calcification. Intracranial arteriosclerosis. Skull: Normal. Negative for fracture or focal lesion. Sinuses/Orbits: No acute finding. Other: Visualized mastoid air cells are unremarkable. No mastoid effusion. IMPRESSION: No acute intracranial abnormality. Electronically Signed   By: Ree Molt M.D.   On: 10/07/2023 12:30   CT Angio Chest/Abd/Pel for Dissection W and/or Wo Contrast Result Date: 10/07/2023 CLINICAL DATA:  Acute aortic syndrome (AAS) suspected. Patient states Hurting in my spine. Alert and oriented. EXAM: CT ANGIOGRAPHY CHEST, ABDOMEN AND PELVIS TECHNIQUE: Non-contrast CT of the chest was initially obtained. Multidetector CT imaging through the chest,  abdomen and pelvis was performed using the standard protocol during bolus administration of intravenous contrast. Multiplanar reconstructed images and MIPs were obtained and reviewed to evaluate the vascular anatomy. RADIATION DOSE REDUCTION: This exam was performed according to the departmental dose-optimization program which includes automated exposure control, adjustment of the mA and/or kV according to patient size and/or use of iterative reconstruction technique. CONTRAST:  OMNIPAQUE  IOHEXOL  350 MG/ML SOLN COMPARISON:  CT scan renal stone protocol from 10/24/2022 and CT scan chest from 08/06/2022. FINDINGS:  CTA CHEST FINDINGS Cardiovascular: No intramural hematoma noted in the thoracic aorta on the unenhanced images. Thoracic aorta is normal in caliber without aneurysm, dissection, vasculitis or significant stenosis. Normal cardiac size. No pericardial effusion. No aortic aneurysm. There are coronary artery calcifications, in keeping with coronary artery disease. There are also moderate peripheral atherosclerotic vascular calcifications of thoracic aorta and its major branches. Mediastinum/Nodes: Visualized thyroid  gland appears grossly unremarkable. No solid / cystic mediastinal masses. The esophagus is nondistended precluding optimal assessment. No axillary, mediastinal or hilar lymphadenopathy by size criteria. Lungs/Pleura: The central tracheo-bronchial tree is patent. There are dependent changes as well as patchy areas of linear, plate-like atelectasis and/or scarring throughout bilateral lungs. No mass or consolidation. No pleural effusion or pneumothorax. No suspicious lung nodules. Musculoskeletal: The visualized soft tissues of the chest wall are grossly unremarkable. No suspicious osseous lesions. There are moderate multilevel degenerative changes in the visualized spine, most pronounced at T6 through T8 levels. There is partial fusion of T7-T8 vertebrae. Mild anterior wedging deformity and  prior vertebroplasty changes noted in the T12 vertebra. No significant retropulsion or spinal canal compromise. Review of the MIP images confirms the above findings. CTA ABDOMEN AND PELVIS FINDINGS VASCULAR Aorta: Normal caliber aorta without aneurysm, dissection, vasculitis or significant stenosis. Mild-to-moderate calcified and noncalcified plaque noted with largest amount of intraluminal thrombus/hematoma in the infrarenal aorta (series 5, images 176-178) causing moderate to severe luminal narrowing. 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 The artery is otherwise patent without evidence of aneurysm, dissection or vasculitis. SMA: Patent without evidence of aneurysm, dissection, vasculitis or significant stenosis. Renals: There is moderate narrowing at the origin of left renal artery (series 10, image 61). Both renal arteries are otherwise patent without evidence of aneurysm, dissection, vasculitis or fibromuscular dysplasia. IMA: Patent without evidence of aneurysm, dissection, vasculitis or significant stenosis. Inflow: Patent without evidence of aneurysm, dissection, vasculitis or significant stenosis. Veins: No obvious venous abnormality within the limitations of this arterial phase study. Review of the MIP images confirms the above findings. NON-VASCULAR Hepatobiliary: The liver is normal in size. Non-cirrhotic configuration. No suspicious mass. No intrahepatic or extrahepatic bile duct dilation. Anatomic variant of Phrygian cap noted. No calcified gallstones. Normal gallbladder wall thickness. No pericholecystic inflammatory changes. Pancreas: Unremarkable. No pancreatic ductal dilatation or surrounding inflammatory changes. Spleen: Within normal limits. No focal lesion. Adrenals/Urinary Tract: Adrenal glands are unremarkable. No suspicious renal mass. No hydronephrosis. No renal or ureteric calculi. Unremarkable urinary bladder. Stomach/Bowel: There is  a small sliding hiatal hernia. There is a small diverticulum arising from the second part of duodenum. No disproportionate dilation of the small or large bowel loops. No evidence of abnormal bowel wall thickening or inflammatory changes. The appendix is unremarkable. Mild-to-moderate amount of stool burden noted mainly in the left hemicolon. There is a large fecaloma in the rectum measuring up to 7.7 x 8.5 cm. There are multiple diverticula mainly in the left hemi colon, without imaging signs of diverticulitis. Vascular/Lymphatic: No ascites or pneumoperitoneum. No abdominal or pelvic lymphadenopathy, by size criteria. Reproductive: The uterus is surgically absent. No large adnexal mass. Other: There is a tiny fat containing umbilical hernia. The soft tissues and abdominal wall are otherwise unremarkable. Musculoskeletal: No suspicious osseous lesions. There are mild - moderate multilevel degenerative changes in the visualized spine. Review of the MIP images confirms the above findings. IMPRESSION: 1. No acute aortic syndrome. No aortic aneurysm, dissection or penetrating atherosclerotic ulcer. 2. There  is moderate-to-severe luminal narrowing of the infrarenal aorta secondary to intraluminal thrombus/hematoma. 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 3. Multiple other nonacute observations, as described above. Aortic Atherosclerosis (ICD10-I70.0). Electronically Signed   By: Ree Molt M.D.   On: 10/07/2023 12:25   DG Chest Port 1 View Result Date: 10/07/2023 CLINICAL DATA:  Sepsis EXAM: PORTABLE CHEST 1 VIEW COMPARISON:  Chest radiograph dated 07/25/2023, CT chest dated 08/06/2022 FINDINGS: Normal lung volumes. Bibasilar patchy opacities. Nodular density projecting over the left apex corresponds to prominent degenerative change at the left first costochondral junction. No pleural effusion or pneumothorax. The heart size and mediastinal  contours are within normal limits. No acute osseous abnormality. IMPRESSION: Bibasilar patchy opacities, which may represent atelectasis, aspiration, or pneumonia. Electronically Signed   By: Limin  Xu M.D.   On: 10/07/2023 11:27   Labs on Admission: I have personally reviewed following labs  CBC: Recent Labs  Lab 10/07/23 1115  WBC 6.3  NEUTROABS 2.6  HGB 11.5*  HCT 36.3  MCV 77.6*  PLT 192   Basic Metabolic Panel: Recent Labs  Lab 10/07/23 1115  NA 142  K 4.0  CL 108  CO2 22  GLUCOSE 120*  BUN 29*  CREATININE 0.99  CALCIUM  9.2   GFR: Estimated Creatinine Clearance: 42.2 mL/min (by C-G formula based on SCr of 0.99 mg/dL).  Liver Function Tests: Recent Labs  Lab 10/07/23 1115  AST 21  ALT 15  ALKPHOS 69  BILITOT 0.7  PROT 6.4*  ALBUMIN 3.5   Recent Labs  Lab 10/07/23 1115  LIPASE 26   Coagulation Profile: Recent Labs  Lab 10/07/23 1115  INR 1.0   Urine analysis:    Component Value Date/Time   COLORURINE COLORLESS (A) 10/07/2023 1825   APPEARANCEUR CLEAR (A) 10/07/2023 1825   LABSPEC 1.024 10/07/2023 1825   PHURINE 5.0 10/07/2023 1825   GLUCOSEU >=500 (A) 10/07/2023 1825   HGBUR NEGATIVE 10/07/2023 1825   BILIRUBINUR NEGATIVE 10/07/2023 1825   BILIRUBINUR Negative 07/30/2023 0928   KETONESUR NEGATIVE 10/07/2023 1825   PROTEINUR NEGATIVE 10/07/2023 1825   UROBILINOGEN 0.2 07/30/2023 0928   NITRITE NEGATIVE 10/07/2023 1825   LEUKOCYTESUR NEGATIVE 10/07/2023 1825   This document was prepared using Dragon Voice Recognition software and may include unintentional dictation errors.  Dr. Sherre [facility]  If 7PM-7AM, please contact overnight-coverage provider If 7AM-7PM, please contact day attending provider [email or web address]  10/07/2023, 7:28 PM

## 2023-10-07 NOTE — ED Provider Notes (Signed)
 [facility] Provider Note    Event Date/Time   First MD Initiated Contact with Patient 10/07/23 1104     (approximate)   History   Back Pain   HPI  Helen Mccormick is a 83 year old female with history of HTN, diabetes, CAD, presenting to the emergency department for evaluation of back pain.  Patient reports that last night she had onset of upper back pain, says she has had similar symptoms before but the pain was worse this morning.  She felt subjective fevers.  She then called EMS.  She said when she felt the sun outside she began to feel nauseous.  In triage, she was noted to be diaphoretic with initial blood pressure 75/45.  She had a witnessed syncopal episode and was brought back to her room.  The patient denies chest pain, shortness of breath, abdominal pain.  Says she has had episodes of passing out with vomiting in the past, but not typically associated with her back pain.   Physical Exam   Triage Vital Signs: ED Triage Vitals  Encounter Vitals Group     BP 10/07/23 1051 (!) 75/45     Girls Systolic BP Percentile --      Girls Diastolic BP Percentile --      Boys Systolic BP Percentile --      Boys Diastolic BP Percentile --      Pulse Rate 10/07/23 1051 68     Resp 10/07/23 1051 16     Temp 10/07/23 1051 98.6 F (37 C)     Temp Source 10/07/23 1051 Oral     SpO2 10/07/23 1051 95 %     Weight 10/07/23 1050 150 lb (68 kg)     Height 10/07/23 1050 5' 4 (1.626 m)     Head Circumference --      Peak Flow --      Pain Score 10/07/23 1048 8     Pain Loc --      Pain Education --      Exclude from Growth Chart --     Most recent vital signs: Vitals:   10/07/23 1300 10/07/23 1330  BP: (!) 109/59 125/64  Pulse:    Resp: 12 12  Temp:    SpO2:       General: Somnolent but arousable on arrival to room, later more awake, diaphoretic CV:  Regular rate, good peripheral perfusion.  Resp:  Unlabored respirations, lungs good  auscultation Abd:  Nondistended, soft, nontender Neuro:  Symmetric facial movement, fluid speech   ED Results / Procedures / Treatments   Labs (all labs ordered are listed, but only abnormal results are displayed) Labs Reviewed  COMPREHENSIVE METABOLIC PANEL WITH GFR - Abnormal; Notable for the following components:      Result Value   Glucose, Bld 120 (*)    BUN 29 (*)    Total Protein 6.4 (*)    GFR, Estimated 57 (*)    All other components within normal limits  CBC WITH DIFFERENTIAL/PLATELET - Abnormal; Notable for the following components:   Hemoglobin 11.5 (*)    MCV 77.6 (*)    MCH 24.6 (*)    RDW 17.3 (*)    All other components within normal limits  CULTURE, BLOOD (ROUTINE X 2)  LACTIC ACID, PLASMA  PROTIME-INR  LIPASE, BLOOD  URINALYSIS, W/ REFLEX TO CULTURE (INFECTION SUSPECTED)  TROPONIN I (HIGH SENSITIVITY)  TROPONIN I (HIGH SENSITIVITY)     EKG EKG independently reviewed and  interpreted by myself demonstrates:  EKG demonstrates sinus rhythm at a rate of 61, PR 190, QRS 153, QTc 516, left bundle branch block morphology, not new, no appreciable superimposed ischemia  RADIOLOGY Imaging independently reviewed and interpreted by myself demonstrates:  CXR without obvious focal consolidation CT dissection protocol without dissection, pneumonia, other acute finding CT head without acute bleed  Formal Radiology Read:  CT Head Wo Contrast Result Date: 10/07/2023 CLINICAL DATA:  Mental status change, unknown cause. EXAM: CT HEAD WITHOUT CONTRAST TECHNIQUE: Contiguous axial images were obtained from the base of the skull through the vertex without intravenous contrast. RADIATION DOSE REDUCTION: This exam was performed according to the departmental dose-optimization program which includes automated exposure control, adjustment of the mA and/or kV according to patient size and/or use of iterative reconstruction technique. COMPARISON:  CT scan head from 08/11/2023.  FINDINGS: Brain: No evidence of acute infarction, hemorrhage, hydrocephalus, extra-axial collection or mass lesion/mass effect. There is bilateral periventricular hypodensity, which is non-specific but most likely seen in the settings of microvascular ischemic changes. Moderate in extent. Otherwise normal appearance of brain parenchyma. Ventricles are normal. Cerebral volume is age appropriate. Vascular: No hyperdense vessel or unexpected calcification. Intracranial arteriosclerosis. Skull: Normal. Negative for fracture or focal lesion. Sinuses/Orbits: No acute finding. Other: Visualized mastoid air cells are unremarkable. No mastoid effusion. IMPRESSION: No acute intracranial abnormality. Electronically Signed   By: Ree Molt M.D.   On: 10/07/2023 12:30   CT Angio Chest/Abd/Pel for Dissection W and/or Wo Contrast Result Date: 10/07/2023 CLINICAL DATA:  Acute aortic syndrome (AAS) suspected. Patient states Hurting in my spine. Alert and oriented. EXAM: CT ANGIOGRAPHY CHEST, ABDOMEN AND PELVIS TECHNIQUE: Non-contrast CT of the chest was initially obtained. Multidetector CT imaging through the chest, abdomen and pelvis was performed using the standard protocol during bolus administration of intravenous contrast. Multiplanar reconstructed images and MIPs were obtained and reviewed to evaluate the vascular anatomy. RADIATION DOSE REDUCTION: This exam was performed according to the departmental dose-optimization program which includes automated exposure control, adjustment of the mA and/or kV according to patient size and/or use of iterative reconstruction technique. CONTRAST:  OMNIPAQUE  IOHEXOL  350 MG/ML SOLN COMPARISON:  CT scan renal stone protocol from 10/24/2022 and CT scan chest from 08/06/2022. FINDINGS: CTA CHEST FINDINGS Cardiovascular: No intramural hematoma noted in the thoracic aorta on the unenhanced images. Thoracic aorta is normal in caliber without aneurysm, dissection, vasculitis or  significant stenosis. Normal cardiac size. No pericardial effusion. No aortic aneurysm. There are coronary artery calcifications, in keeping with coronary artery disease. There are also moderate peripheral atherosclerotic vascular calcifications of thoracic aorta and its major branches. Mediastinum/Nodes: Visualized thyroid  gland appears grossly unremarkable. No solid / cystic mediastinal masses. The esophagus is nondistended precluding optimal assessment. No axillary, mediastinal or hilar lymphadenopathy by size criteria. Lungs/Pleura: The central tracheo-bronchial tree is patent. There are dependent changes as well as patchy areas of linear, plate-like atelectasis and/or scarring throughout bilateral lungs. No mass or consolidation. No pleural effusion or pneumothorax. No suspicious lung nodules. Musculoskeletal: The visualized soft tissues of the chest wall are grossly unremarkable. No suspicious osseous lesions. There are moderate multilevel degenerative changes in the visualized spine, most pronounced at T6 through T8 levels. There is partial fusion of T7-T8 vertebrae. Mild anterior wedging deformity and prior vertebroplasty changes noted in the T12 vertebra. No significant retropulsion or spinal canal compromise. Review of the MIP images confirms the above findings. CTA ABDOMEN AND PELVIS FINDINGS VASCULAR Aorta: Normal caliber aorta without aneurysm,  dissection, vasculitis or significant stenosis. Mild-to-moderate calcified and noncalcified plaque noted with largest amount of intraluminal thrombus/hematoma in the infrarenal aorta (series 5, images 176-178) causing moderate to severe luminal narrowing. 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 The artery is otherwise patent without evidence of aneurysm, dissection or vasculitis. SMA: Patent without evidence of aneurysm, dissection, vasculitis or significant stenosis. Renals: There is moderate narrowing at  the origin of left renal artery (series 10, image 61). Both renal arteries are otherwise patent without evidence of aneurysm, dissection, vasculitis or fibromuscular dysplasia. IMA: Patent without evidence of aneurysm, dissection, vasculitis or significant stenosis. Inflow: Patent without evidence of aneurysm, dissection, vasculitis or significant stenosis. Veins: No obvious venous abnormality within the limitations of this arterial phase study. Review of the MIP images confirms the above findings. NON-VASCULAR Hepatobiliary: The liver is normal in size. Non-cirrhotic configuration. No suspicious mass. No intrahepatic or extrahepatic bile duct dilation. Anatomic variant of Phrygian cap noted. No calcified gallstones. Normal gallbladder wall thickness. No pericholecystic inflammatory changes. Pancreas: Unremarkable. No pancreatic ductal dilatation or surrounding inflammatory changes. Spleen: Within normal limits. No focal lesion. Adrenals/Urinary Tract: Adrenal glands are unremarkable. No suspicious renal mass. No hydronephrosis. No renal or ureteric calculi. Unremarkable urinary bladder. Stomach/Bowel: There is a small sliding hiatal hernia. There is a small diverticulum arising from the second part of duodenum. No disproportionate dilation of the small or large bowel loops. No evidence of abnormal bowel wall thickening or inflammatory changes. The appendix is unremarkable. Mild-to-moderate amount of stool burden noted mainly in the left hemicolon. There is a large fecaloma in the rectum measuring up to 7.7 x 8.5 cm. There are multiple diverticula mainly in the left hemi colon, without imaging signs of diverticulitis. Vascular/Lymphatic: No ascites or pneumoperitoneum. No abdominal or pelvic lymphadenopathy, by size criteria. Reproductive: The uterus is surgically absent. No large adnexal mass. Other: There is a tiny fat containing umbilical hernia. The soft tissues and abdominal wall are otherwise unremarkable.  Musculoskeletal: No suspicious osseous lesions. There are mild - moderate multilevel degenerative changes in the visualized spine. Review of the MIP images confirms the above findings. IMPRESSION: 1. No acute aortic syndrome. No aortic aneurysm, dissection or penetrating atherosclerotic ulcer. 2. There is moderate-to-severe luminal narrowing of the infrarenal aorta secondary to intraluminal thrombus/hematoma. 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 3. Multiple other nonacute observations, as described above. Aortic Atherosclerosis (ICD10-I70.0). Electronically Signed   By: Ree Molt M.D.   On: 10/07/2023 12:25   DG Chest Port 1 View Result Date: 10/07/2023 CLINICAL DATA:  Sepsis EXAM: PORTABLE CHEST 1 VIEW COMPARISON:  Chest radiograph dated 07/25/2023, CT chest dated 08/06/2022 FINDINGS: Normal lung volumes. Bibasilar patchy opacities. Nodular density projecting over the left apex corresponds to prominent degenerative change at the left first costochondral junction. No pleural effusion or pneumothorax. The heart size and mediastinal contours are within normal limits. No acute osseous abnormality. IMPRESSION: Bibasilar patchy opacities, which may represent atelectasis, aspiration, or pneumonia. Electronically Signed   By: Limin  Xu M.D.   On: 10/07/2023 11:27    PROCEDURES:  Critical Care performed: No  Procedures   MEDICATIONS ORDERED IN ED: Medications  ondansetron  (ZOFRAN ) 4 MG/2ML injection (  Given by Other 10/07/23 1147)  sodium chloride  0.9 % bolus 1,000 mL (0 mLs Intravenous Stopped 10/07/23 1414)  iohexol  (OMNIPAQUE ) 350 MG/ML injection 100 mL (100 mLs Intravenous Contrast Given 10/07/23  1150)     IMPRESSION / MDM / ASSESSMENT AND PLAN / ED COURSE  I reviewed the triage vital signs and the nursing notes.  Differential diagnosis includes, but is not limited to, aortic dissection, ACS, pneumonia, pneumothorax,  exacerbation of chronic back pain, arrhythmia, acute intra-abdominal process, acute intracranial bleed  Patient's presentation is most consistent with acute presentation with potential threat to life or bodily function.  83 year old female presenting initially with complaint of upper back pain found to be hypotensive with witnessed syncopal episode on presentation.  BP did improve after patient brought back to room, fluids initiated.  Will initiate broad workup including labs, CT dissection protocol, CT head.  Will bypass labs to facilitate quicker completion of workup.  CT head and dissection protocol without acute findings. Labs with mild stable anemia.  CMP without significant derangement.  Negative troponin.  Normal lipase.  Normal lactate.  No obvious infectious source, but concerning clinical history with syncopal episode without prodrome here, hypotension, multiple syncopal episodes in the past few months.  Patient denies prior evaluation for her syncopal episodes.  Did discuss admission for this and patient is agreeable.  Will reach out to hospitalist team. Clinical Course as of 10/07/23 1435  Thu Oct 07, 2023  1435 Case discussed with Dr. Sherre.  She will evaluate for anticipated admission. [NR]    Clinical Course User Index [NR] Levander Slate, MD     FINAL CLINICAL IMPRESSION(S) / ED DIAGNOSES   Final diagnoses:  Upper back pain  Syncope and collapse     Rx / DC Orders   ED Discharge Orders     None        Note:  This document was prepared using Dragon voice recognition software and may include unintentional dictation errors.   Levander Slate, MD 10/07/23 1435

## 2023-10-07 NOTE — Hospital Course (Addendum)
 Helen Mccormick is an 83 year old female with history of hypertension, hypothyroid, non-insulin -dependent diabetes mellitus, hyperlipidemia, depression, hypertension, who presents emergency department for chief concerns of back pain.    While in the ED, patient experienced a witnessed syncopal event.  Vitals in the ED showed T of 98.6, rr 16, heart rate 68, blood pressure 75/45, SpO2 95% on room air.  Serum sodium is 142, potassium 4.0, chloride 108, bicarb 22, BUN of 29, serum creatinine 0.99, EGFR 57, nonfasting blood glucose 120, WBC 6.3, hemoglobin 11.5, platelets of 192.  Lactic acid is 1.7.  High since troponin is 6.  ED treatment: Ondansetron  4 mg IM one-time dose, NS 1 L bolus.

## 2023-10-07 NOTE — Assessment & Plan Note (Signed)
-   Escitalopram 10 mg daily resumed ?

## 2023-10-07 NOTE — ED Notes (Signed)
 Pt added to CCMD board

## 2023-10-07 NOTE — Assessment & Plan Note (Signed)
Home rosuvastatin 40 mg daily resumed

## 2023-10-08 ENCOUNTER — Observation Stay
Admit: 2023-10-08 | Discharge: 2023-10-08 | Disposition: A | Discharge status: Home / Self Care | Attending: Internal Medicine

## 2023-10-08 ENCOUNTER — Observation Stay

## 2023-10-08 DIAGNOSIS — R569 Unspecified convulsions: Secondary | ICD-10-CM

## 2023-10-08 DIAGNOSIS — E785 Hyperlipidemia, unspecified: Secondary | ICD-10-CM | POA: Diagnosis not present

## 2023-10-08 DIAGNOSIS — I6782 Cerebral ischemia: Secondary | ICD-10-CM | POA: Diagnosis not present

## 2023-10-08 DIAGNOSIS — R55 Syncope and collapse: Secondary | ICD-10-CM | POA: Diagnosis not present

## 2023-10-08 DIAGNOSIS — I10 Essential (primary) hypertension: Secondary | ICD-10-CM | POA: Diagnosis not present

## 2023-10-08 DIAGNOSIS — E119 Type 2 diabetes mellitus without complications: Secondary | ICD-10-CM | POA: Diagnosis not present

## 2023-10-08 LAB — ECHOCARDIOGRAM COMPLETE
AR max vel: 2.44 cm2
AV Area VTI: 2.49 cm2
AV Area mean vel: 2.33 cm2
AV Mean grad: 3 mmHg
AV Peak grad: 6.1 mmHg
Ao pk vel: 1.23 m/s
Area-P 1/2: 2.48 cm2
Calc EF: 60.3 %
Height: 64 in
MV VTI: 1.72 cm2
S' Lateral: 3.6 cm
Single Plane A2C EF: 70 %
Single Plane A4C EF: 51.2 %
Weight: 2400 [oz_av]

## 2023-10-08 LAB — CBC
HCT: 33.1 % — ABNORMAL LOW (ref 36.0–46.0)
Hemoglobin: 10.8 g/dL — ABNORMAL LOW (ref 12.0–15.0)
MCH: 25.1 pg — ABNORMAL LOW (ref 26.0–34.0)
MCHC: 32.6 g/dL (ref 30.0–36.0)
MCV: 77 fL — ABNORMAL LOW (ref 80.0–100.0)
Platelets: 169 K/uL (ref 150–400)
RBC: 4.3 MIL/uL (ref 3.87–5.11)
RDW: 17.2 % — ABNORMAL HIGH (ref 11.5–15.5)
WBC: 6.3 K/uL (ref 4.0–10.5)
nRBC: 0 % (ref 0.0–0.2)

## 2023-10-08 LAB — BASIC METABOLIC PANEL WITH GFR
Anion gap: 8 (ref 5–15)
BUN: 25 mg/dL — ABNORMAL HIGH (ref 8–23)
CO2: 26 mmol/L (ref 22–32)
Calcium: 8.8 mg/dL — ABNORMAL LOW (ref 8.9–10.3)
Chloride: 106 mmol/L (ref 98–111)
Creatinine, Ser: 0.92 mg/dL (ref 0.44–1.00)
GFR, Estimated: >60 mL/min (ref >60)
Glucose, Bld: 93 mg/dL (ref 70–99)
Potassium: 3.7 mmol/L (ref 3.5–5.1)
Sodium: 140 mmol/L (ref 135–145)

## 2023-10-08 LAB — CBG MONITORING, ED
Glucose-Capillary: 100 mg/dL — ABNORMAL HIGH (ref 70–99)
Glucose-Capillary: 90 mg/dL (ref 70–99)

## 2023-10-08 MED ORDER — CARVEDILOL 6.25 MG PO TABS: 6.2500 mg | ORAL_TABLET | Freq: Two times a day (BID) | ORAL | 0 refills | Status: DC

## 2023-10-08 MED ORDER — CARVEDILOL 6.25 MG PO TABS: 6.2500 mg | ORAL_TABLET | Freq: Two times a day (BID) | ORAL | Status: DC

## 2023-10-08 NOTE — Evaluation (Signed)
 Occupational Therapy Evaluation Patient Details Name: Helen Mccormick MRN: [id number] DOB: Feb 16, 1941 Today's Date: 10/08/2023   History of Present Illness   Pt is an 83 year old female who presents to ED for chief concerns of back pain. PMH of hypertension, hypothyroid, on-insulin -dependent diabetes mellitus, hyperlipidemia, depression, hypertension     Clinical Impressions Pt was seen for OT evaluation this date. PTA, pt resides at home alone where she is IND with all tasks and does not utilize AD. Pt presents to acute OT demonstrating no functional decline or impairment in ADL performance or functional mobility. She denies pain or dizziness throughout session. Pt demo all aspects of toileting, LB dressing, standing grooming tasks and all transfers/mobility with MOD I to supervision without AD use this date. Educated on pacing and sitting to perform tasks to prevent falls/syncopal episodes and pt verbalized understanding. No further acute OT needs and will sign off in house with no follow up OT needed on DC.      If plan is discharge home, recommend the following:         Functional Status Assessment   Patient has not had a recent decline in their functional status     Equipment Recommendations         Recommendations for Other Services         Precautions/Restrictions   Precautions Precautions: Fall Recall of Precautions/Restrictions: Intact Restrictions Weight Bearing Restrictions Per Provider Order: No     Mobility Bed Mobility Overal bed mobility: Independent                  Transfers Overall transfer level: Modified independent Equipment used: None               General transfer comment: no AD use, IND with STS and ambulation out of room to bathroom and back in ED      Balance Overall balance assessment: No apparent balance deficits (not formally assessed), Modified Independent                                          ADL either performed or assessed with clinical judgement   ADL Overall ADL's : At baseline;Modified independent                                       General ADL Comments: pt demo all aspects of toileting, LB dressing, standing grooming tasks and all transfers/mobility with MOD I to supervision without AD use     Vision         Perception         Praxis         Pertinent Vitals/Pain Pain Assessment Pain Assessment: No/denies pain     Extremity/Trunk Assessment Upper Extremity Assessment Upper Extremity Assessment: Overall WFL for tasks assessed   Lower Extremity Assessment Lower Extremity Assessment: Overall WFL for tasks assessed   Cervical / Trunk Assessment Cervical / Trunk Assessment: Normal   Communication Communication Communication: No apparent difficulties   Cognition Arousal: Alert Behavior During Therapy: WFL for tasks assessed/performed                                 Following commands: Intact       Cueing  General Comments  Cueing Techniques: Verbal cues      Exercises Other Exercises Other Exercises: Edu on sitting to perform tasks and pacing d/t syncopal epsidoes history. Pt verbalized understanding.   Shoulder Instructions      Home Living Family/patient expects to be discharged to:: Private residence Living Arrangements: Alone Available Help at Discharge: Family Type of Home: Apartment Home Access: Stairs to enter Secretary/administrator of Steps: 3 Entrance Stairs-Rails: Right Home Layout: One level     Bathroom Shower/Tub: Tub only   Firefighter: Standard Bathroom Accessibility: No   Home Equipment: None   Additional Comments: no AD use, trying to get grab bars set up in tub soon      Prior Functioning/Environment Prior Level of Function : Independent/Modified Independent             Mobility Comments: no AD use ADLs Comments: ind    OT Problem List:     OT  Treatment/Interventions:        OT Goals(Current goals can be found in the care plan section)       OT Frequency:       Co-evaluation              AM-PAC OT 6 Clicks Daily Activity     Outcome Measure Help from another person eating meals?: None Help from another person taking care of personal grooming?: None Help from another person toileting, which includes using toliet, bedpan, or urinal?: None Help from another person bathing (including washing, rinsing, drying)?: None Help from another person to put on and taking off regular upper body clothing?: None Help from another person to put on and taking off regular lower body clothing?: None 6 Click Score: 24   End of Session Nurse Communication: Mobility status  Activity Tolerance: Patient tolerated treatment well Patient left: in bed;with call bell/phone within reach;with bed alarm set  OT Visit Diagnosis: Other abnormalities of gait and mobility (R26.89)                Time: 8978-8952 OT Time Calculation (min): 26 min Charges:  OT General Charges $OT Visit: 1 Visit OT Evaluation $OT Eval Low Complexity: 1 Low OT Treatments $Self Care/Home Management : 8-22 mins Breane Grunwald, OTR/L 10/08/23, 12:33 PM  Alize Acy E Hagen Bohorquez 10/08/2023, 12:31 PM

## 2023-10-08 NOTE — Progress Notes (Signed)
 Eeg done

## 2023-10-08 NOTE — Evaluation (Signed)
 Physical Therapy Evaluation Patient Details Name: Helen Mccormick MRN: [id number] DOB: [birth year] Today's Date: 10/08/2023  History of Present Illness  [name] is an 83 year old female with history of hypertension, hypothyroid, non-insulin -dependent diabetes mellitus, hyperlipidemia, depression, hypertension, who presents emergency department for chief concerns of back pain.  Clinical Impression  Pt is a pleasant 83 year old female who was admitted for syncopal episode. Pt performs STS transfer mod I, no LOB experienced. Ambulation x60' successful with no AD, pt stating she felt at her baseline level with no LOB. Pt states she feels she is at her baseline level and does not need any further PT services. PT is d/c pt in house and please consult if there is a change in status.          If plan is discharge home, recommend the following: A little help with walking and/or transfers;A little help with bathing/dressing/bathroom;Assist for transportation;Help with stairs or ramp for entrance   Can travel by private vehicle        Equipment Recommendations None recommended by PT  Recommendations for Other Services       Functional Status Assessment Patient has had a recent decline in their functional status and demonstrates the ability to make significant improvements in function in a reasonable and predictable amount of time.     Precautions / Restrictions Precautions Precautions: Fall Recall of Precautions/Restrictions: Intact Restrictions Weight Bearing Restrictions Per Provider Order: No      Mobility  Bed Mobility Overal bed mobility: Independent             General bed mobility comments: pt found sititng up on the side of bed upon entering room    Transfers Overall transfer level: Needs assistance Equipment used: None Transfers: Sit to/from Stand Sit to Stand: Modified independent (Device/Increase time)           General transfer comment: mod i for STS,  no LOB, pt not experiencing pain or discomfort    Ambulation/Gait Ambulation/Gait assistance: Supervision Gait Distance (Feet): 60 Feet Assistive device: None Gait Pattern/deviations: WFL(Within Functional Limits)       General Gait Details: WFL, no LOB experienced  Stairs            Wheelchair Mobility     Tilt Bed    Modified Rankin (Stroke Patients Only)       Balance Overall balance assessment: Needs assistance Sitting-balance support: No upper extremity supported, Feet supported Sitting balance-Leahy Scale: Good Sitting balance - Comments: sitting EOB   Standing balance support: No upper extremity supported Standing balance-Leahy Scale: Good Standing balance comment: no AD use                             Pertinent Vitals/Pain Pain Assessment Pain Assessment: No/denies pain    Home Living Family/patient expects to be discharged to:: Private residence Living Arrangements: Alone Available Help at Discharge: Family Type of Home: Apartment Home Access: Stairs to enter Entrance Stairs-Rails: Right Entrance Stairs-Number of Steps: 3   Home Layout: One level Home Equipment: None Additional Comments: no AD use, trying to get grab bars set up in tub soon    Prior Function Prior Level of Function : Independent/Modified Independent             Mobility Comments: no AD use ADLs Comments: ind     Extremity/Trunk Assessment   Upper Extremity Assessment Upper Extremity Assessment: Overall WFL for tasks assessed  Lower Extremity Assessment Lower Extremity Assessment: Overall WFL for tasks assessed    Cervical / Trunk Assessment Cervical / Trunk Assessment: Normal  Communication   Communication Communication: No apparent difficulties    Cognition Arousal: Alert Behavior During Therapy: WFL for tasks assessed/performed   PT - Cognitive impairments: No apparent impairments                         Following commands:  Intact       Cueing Cueing Techniques: Verbal cues, Tactile cues     General Comments      Exercises     Assessment/Plan    PT Assessment Patient does not need any further PT services  PT Problem List         PT Treatment Interventions      PT Goals (Current goals can be found in the Care Plan section)       Frequency       Co-evaluation               AM-PAC PT 6 Clicks Mobility  Outcome Measure Help needed turning from your back to your side while in a flat bed without using bedrails?: None Help needed moving from lying on your back to sitting on the side of a flat bed without using bedrails?: None Help needed moving to and from a bed to a chair (including a wheelchair)?: None Help needed standing up from a chair using your arms (e.g., wheelchair or bedside chair)?: A Little Help needed to walk in hospital room?: A Little Help needed climbing 3-5 steps with a railing? : A Little 6 Click Score: 21    End of Session   Activity Tolerance: Patient tolerated treatment well Patient left: in bed;with call bell/phone within reach;with bed alarm set Nurse Communication: Mobility status PT Visit Diagnosis: Unsteadiness on feet (R26.81)    Time: 9093-9082 PT Time Calculation (min) (ACUTE ONLY): 11 min   Charges:                  Margree Gimbel Romero-Perozo, SPT  10/08/2023, 10:28 AM

## 2023-10-08 NOTE — Progress Notes (Signed)
Patient discharged to home accompanied by daughter.

## 2023-10-08 NOTE — Procedures (Signed)
 Patient Name: Laynee Lockamy  MRN: [id number]  Epilepsy Attending: Arlin MALVA Krebs  Referring Physician/Provider: Sherre Greig SAILOR, DO  Date: 10/08/2023 Duration: 28.37 mins  Patient history: 83yo F with syncope. EEG to evaluate for seizure  Level of alertness: Awake, asleep  AEDs during EEG study: None  Technical aspects: This EEG study was done with scalp electrodes positioned according to the 10-20 International system of electrode placement. Electrical activity was reviewed with band pass filter of 1-70Hz , sensitivity of 7 uV/mm, display speed of 66mm/sec with a 60Hz  notched filter applied as appropriate. EEG data were recorded continuously and digitally stored.  Video monitoring was available and reviewed as appropriate.  Description: The posterior dominant rhythm consists of 8-9 Hz activity of moderate voltage (25-35 uV) seen predominantly in posterior head regions, symmetric and reactive to eye opening and eye closing. Sleep was characterized by vertex waves, sleep spindles (12 to 14 Hz), maximal frontocentral region.  There is an excessive amount of 15 to 18 Hz beta activity distributed symmetrically and diffusely. Hyperventilation and photic stimulation were not performed.     ABNORMALITY - Excessive beta, generalized  IMPRESSION: This study is within normal limits. The excessive beta activity seen in the background is most likely due to the effect of benzodiazepine and is a benign EEG pattern. No seizures or epileptiform discharges were seen throughout the recording.  A normal interictal EEG does not exclude the diagnosis of epilepsy.   Gaven Eugene O Fynn Vanblarcom

## 2023-10-08 NOTE — Progress Notes (Signed)
 Awaiting for daughter to arrive to take patient home. Patient A+Ox4. VSS. Medications and discharge instructions reviewed with patient. All questions answered. PIV x 1 removed, no bleeding,intact. Patient verbalized understanding signs and symptoms of infection. Patient satisfied with overall care at [facility]. Patient agreed to follow up with all appointments as listed on AVS.

## 2023-10-08 NOTE — Care Management Obs Status (Signed)
 MEDICARE OBSERVATION STATUS NOTIFICATION   Patient Details  Name: Helen Mccormick MRN: [id number] Date of Birth: [birth year]   Medicare Observation Status Notification Given:  Yes    Edsel DELENA Fischer, LCSW 10/08/2023, 11:09 AM

## 2023-10-08 NOTE — ED Notes (Signed)
 This nurse called lab to collect morning labs

## 2023-10-08 NOTE — Consult Note (Incomplete)
 Hospital Consult    Reason for Consult:  Lower Aortic Narrowing with Calcification Requesting Physician:  Dr Calvin Robson MD  MRN #:  [id number]  History of Present Illness: This is a 83 y.o. female with history of hypertension, hypothyroid, non-insulin -dependent diabetes mellitus, hyperlipidemia, depression, hypertension, who presents emergency department for chief concerns of back pain.   Past Medical History:  Diagnosis Date   Allergy    Anxiety    CHF (congestive heart failure) (HCC)    Colon polyp    Depression    GERD (gastroesophageal reflux disease)    Heart murmur    Hyperlipidemia    Hypertension    Hypothyroidism    Sleep apnea    Thyroid  disease     Past Surgical History:  Procedure Laterality Date   ABDOMINAL HYSTERECTOMY     BACK SURGERY     BACK SURGERY     CERVICAL FUSION     COLONOSCOPY WITH PROPOFOL  N/A 05/28/2017   Procedure: COLONOSCOPY WITH PROPOFOL ;  Surgeon: Therisa Bi, MD;  Location: [facility] ENDOSCOPY;  Service: Gastroenterology;  Laterality: N/A;   COLONOSCOPY WITH PROPOFOL  N/A 11/17/2019   Procedure: COLONOSCOPY WITH PROPOFOL ;  Surgeon: Maryruth Ole DASEN, MD;  Location: [facility] ENDOSCOPY;  Service: Endoscopy;  Laterality: N/A;   COLONOSCOPY WITH PROPOFOL  N/A 04/20/2023   Procedure: COLONOSCOPY WITH PROPOFOL ;  Surgeon: Therisa Bi, MD;  Location: [facility] ENDOSCOPY;  Service: Gastroenterology;  Laterality: N/A;   ESOPHAGOGASTRODUODENOSCOPY (EGD) WITH PROPOFOL  N/A 11/17/2019   Procedure: ESOPHAGOGASTRODUODENOSCOPY (EGD) WITH PROPOFOL ;  Surgeon: Maryruth Ole DASEN, MD;  Location: [facility] ENDOSCOPY;  Service: Endoscopy;  Laterality: N/A;   ESOPHAGOGASTRODUODENOSCOPY (EGD) WITH PROPOFOL  N/A 04/20/2023   Procedure: ESOPHAGOGASTRODUODENOSCOPY (EGD) WITH PROPOFOL ;  Surgeon: Therisa Bi, MD;  Location: [facility] ENDOSCOPY;  Service: Gastroenterology;  Laterality: N/A;   IR KYPHO THORACIC WITH BONE BIOPSY  10/20/2022   IR RADIOLOGIST EVAL & MGMT  09/22/2022   NECK  SURGERY     POLYPECTOMY  04/20/2023   Procedure: POLYPECTOMY;  Surgeon: Therisa Bi, MD;  Location: [facility] ENDOSCOPY;  Service: Gastroenterology;;   THYROID  SURGERY     THYROID  SURGERY      Allergies  Allergen Reactions   Amlodipine  Hives and Swelling   Tramadol  Other (See Comments)    Dizziness    Isosorbide  Dinitrate     Other Reaction(s): Headache   Codeine Nausea Only   Etodolac Other (See Comments)    Other reaction(s): Abdominal Pain    Prior to Admission medications   Medication Sig Start Date End Date Taking? Authorizing Provider  albuterol  (VENTOLIN  HFA) 108 (90 Base) MCG/ACT inhaler Inhale 2 puffs by mouth every 6 hours as needed for wheezing/shortness of breath 03/25/23  Yes Fernand Fredy RAMAN, MD  alendronate (FOSAMAX) 70 MG tablet Take 70 mg by mouth.  Take 1 tablet (70 mg total) by mouth every 7 (seven) days Take on an empty stomach with a full glass of water. Avoid mineral or well water. Do not eat or take other medications for at least 30 minutes after dose. Sit or stand for at least 30 minutes after dose. 08/27/23 08/26/24 Yes [provider]  amLODipine  (NORVASC ) 5 MG tablet Take 5 mg by mouth daily. 05/26/23  Yes [provider]  ARNUITY ELLIPTA  100 MCG/ACT AEPB Inhale 1 puff by mouth every day 03/25/23  Yes Fernand Fredy RAMAN, MD  Azelastine HCl 137 MCG/SPRAY SOLN Use 1 spray into each nostril every day as directed 06/25/23 10/07/23 Yes Fernand Fredy RAMAN, MD  carvedilol  (COREG ) 12.5  MG tablet Take 1 tablet by mouth twice daily 05/26/23  Yes Scoggins, Amber, NP  Cholecalciferol  (D3) 25 MCG (1000 UT) capsule Take 1,000 Units by mouth daily.   Yes [provider]  ENTRESTO  97-103 MG Take 1 tablet by mouth twice daily 04/26/23  Yes Fernand Denyse LABOR, MD  escitalopram  (LEXAPRO ) 10 MG tablet Take 1 tablet by mouth every day 05/17/23  Yes Fernand Fredy RAMAN, MD  furosemide  (LASIX ) 20 MG tablet Take 20 mg by mouth daily. 05/24/23  Yes [provider]  gabapentin   (NEURONTIN ) 100 MG capsule Take 1 capsule by mouth 3 times a day 08/16/23  Yes Fernand Fredy RAMAN, MD  isosorbide  dinitrate (ISORDIL ) 30 MG tablet Take 1 tablet by mouth every day 05/17/23  Yes Fernand Fredy RAMAN, MD  JARDIANCE  25 MG TABS tablet Take 1 tablet by mouth every day 06/07/23  Yes Fernand Fredy RAMAN, MD  levothyroxine  (SYNTHROID ) 75 MCG tablet Take 1 tablet (75 mcg total) by mouth daily. 01/01/23 01/01/24 Yes Fernand Fredy RAMAN, MD  Multiple Vitamin (MULTIVITAMIN) tablet Take 1 tablet by mouth daily.   Yes [provider]  oxycodone  (OXY-IR) 5 MG capsule Take 5 mg by mouth every 4 (four) hours as needed.   Yes [provider]  pantoprazole  (PROTONIX ) 40 MG tablet Take 1 tablet by mouth every day 08/23/23  Yes Fernand Fredy RAMAN, MD  polyethylene glycol powder (GLYCOLAX /MIRALAX ) 17 GM/SCOOP powder Take 17 g by mouth.  Take 17 g by mouth once daily   Yes [provider]  RESTASIS  0.05 % ophthalmic emulsion  08/21/23  Yes [provider]  rosuvastatin  (CRESTOR ) 40 MG tablet Take 1 tablet by mouth every day 08/16/23  Yes Fernand Fredy RAMAN, MD  Accu-Chek Softclix Lancets lancets Use as instructed 02/19/23   Fernand Fredy RAMAN, MD  Blood Glucose Monitoring Suppl (ACCU-CHEK GUIDE) w/Device KIT See admin instructions. Patient not taking: Reported on 07/26/2023 11/28/20   [provider]  busPIRone (BUSPAR) 15 MG tablet Take 15 mg by mouth 2 (two) times daily. Patient not taking: Reported on 07/26/2023 01/29/23   [provider]  fluticasone  (FLOVENT  HFA) 110 MCG/ACT inhaler Inhale 1 puff into the lungs 2 (two) times daily. Patient not taking: Reported on 07/26/2023    [provider]  meclizine  (ANTIVERT ) 12.5 MG tablet Take 1 tablet (12.5 mg total) by mouth 3 (three) times daily as needed for dizziness. Patient not taking: Reported on 07/26/2023 10/13/22   Fernand Fredy RAMAN, MD  nitroGLYCERIN  (NITROSTAT ) 0.4 MG SL tablet Place 1 tablet (0.4 mg total) under the tongue every 5  (five) minutes as needed for chest pain. Patient not taking: Reported on 06/15/2023 08/20/17   Nyle Tinnie Fuller, NP  [facility] VERIO test strip USE TO CHECK BLOOD SUGAR TWICE DAILY 02/16/23   Fernand Fredy RAMAN, MD    Social History   Socioeconomic History   Marital status: Single    Spouse name: Not on file   Number of children: Not on file   Years of education: Not on file   Highest education level: 10th grade  Occupational History   Occupation: retired  Tobacco Use   Smoking status: Never   Smokeless tobacco: Former    Types: Engineer, drilling   Vaping status: Never Used  Substance and Sexual Activity   Alcohol use: No   Drug use: No   Sexual activity: Not Currently  Other Topics Concern   Not on file  Social History Narrative  Not on file   Social Drivers of Health   Financial Resource Strain: Low Risk  (09/15/2023)   Received [facility]   Overall Financial Resource Strain (CARDIA)    Difficulty of Paying Living Expenses: Not hard at all  Food Insecurity: No Food Insecurity (09/15/2023)   [facility]   Hunger Vital Sign    Within the past 12 months, you worried that your food would run out before you got the money to buy more.: Never true    Within the past 12 months, the food you bought just didn't last and you didn't have money to get more.: Never true  Transportation Needs: No Transportation Needs (09/15/2023)   Received from [facility] - Transportation    In the past 12 months, has lack of transportation kept you from medical appointments or from getting medications?: No    Lack of Transportation (Non-Medical): No  Physical Activity: Sufficiently Active (12/20/2018)   Exercise Vital Sign    Days of Exercise per Week: 4 days    Minutes of Exercise per Session: 60 min  Stress: No Stress Concern Present (03/12/2017)   [employer] [facility] [employer] - Occupational Stress  Questionnaire    Feeling of Stress : Only a little  Social Connections: Moderately Integrated (04/27/2017)   Social Connection and Isolation Panel    Frequency of Communication with Friends and Family: More than three times a week    Frequency of Social Gatherings with Friends and Family: Once a week    Attends Religious Services: More than 4 times per year    Active Member of [employer] or Organizations: Yes    Attends Banker Meetings: More than 4 times per year    Marital Status: Never married  Intimate Partner Violence: Unknown (03/12/2017)   Humiliation, Afraid, Rape, and Kick questionnaire    Fear of Current or Ex-Partner: Patient declined    Emotionally Abused: Patient declined    Physically Abused: Patient declined    Sexually Abused: Patient declined    *** Family History  Problem Relation Age of Onset   Breast cancer Maternal Aunt 43   Diabetes Mother    Leukemia Mother    Heart Problems Mother    Diabetes Sister    Mental illness Sister    Colon polyps Sister    Stroke Father    Cancer Brother        rectal   Healthy Daughter    Hypertension Son    Diabetes Sister    Thyroid  disease Sister    Healthy Sister    Healthy Sister    Stroke Brother    Healthy Son    Healthy Son    Healthy Daughter     ROS: Otherwise negative unless mentioned in HPI  Physical Examination  Vitals:   10/08/23 0858 10/08/23 0900  BP: 137/72   Pulse: 66   Resp: 16   Temp:  97.6 F (36.4 C)  SpO2: 100%    Body mass index is 25.75 kg/m.  General:  WDWN in NAD Gait: Not observed HENT: WNL, normocephalic Pulmonary: normal non-labored breathing, without Rales, rhonchi,  wheezing Cardiac: {Desc; regular/irreg:14544}, without  Murmurs, rubs or gallops; {With/Without:20273} carotid bruits Abdomen: *** soft, NT/ND, no masses Skin: {With/Without:20273} rashes Vascular Exam/Pulses: *** Extremities: {With/Without:20273} ischemic changes, {With/Without:20273} Gangrene ,  {With/Without:20273} cellulitis; {With/Without:20273} open wounds;  Musculoskeletal: no muscle wasting or atrophy  Neurologic: A&O X 3;  No focal weakness or paresthesias are detected; speech is fluent/normal Psychiatric:  The pt has {Desc; normal/abnormal:11317::Normal} affect. Lymph:  Unremarkable  CBC    Component Value Date/Time   WBC 6.3 10/08/2023 0638   RBC 4.30 10/08/2023 0638   HGB 10.8 (L) 10/08/2023 0638   HGB 11.2 06/15/2023 1152   HGB 11.6 08/02/2008 1421   HCT 33.1 (L) 10/08/2023 0638   HCT 37.2 06/15/2023 1152   HCT 34.6 (L) 08/02/2008 1421   PLT 169 10/08/2023 0638   PLT 181 06/15/2023 1152   MCV 77.0 (L) 10/08/2023 0638   MCV 80 06/15/2023 1152   MCV 78 (L) 10/27/2012 1610   MCV 76 (L) 08/02/2008 1421   MCH 25.1 (L) 10/08/2023 0638   MCHC 32.6 10/08/2023 0638   RDW 17.2 (H) 10/08/2023 0638   RDW 15.8 (H) 06/15/2023 1152   RDW 15.0 (H) 10/27/2012 1610   RDW 13.8 08/02/2008 1421   LYMPHSABS 3.2 10/07/2023 1115   LYMPHSABS 1.8 06/15/2023 1152   LYMPHSABS 2.9 10/27/2012 1610   LYMPHSABS 2.5 06/14/2008 1415   MONOABS 0.3 10/07/2023 1115   MONOABS 0.4 10/27/2012 1610   EOSABS 0.1 10/07/2023 1115   EOSABS 0.2 06/15/2023 1152   EOSABS 0.2 10/27/2012 1610   EOSABS 0.1 06/14/2008 1415   BASOSABS 0.0 10/07/2023 1115   BASOSABS 0.0 06/15/2023 1152   BASOSABS 0.0 10/27/2012 1610   BASOSABS 0.1 06/14/2008 1415    BMET    Component Value Date/Time   NA 140 10/08/2023 0638   NA 143 06/15/2023 1152   NA 140 07/17/2012 0438   NA 137 06/14/2008 1415   K 3.7 10/08/2023 0638   K 3.5 07/17/2012 0438   K 3.4 06/14/2008 1415   CL 106 10/08/2023 0638   CL 105 07/17/2012 0438   CL 98 06/14/2008 1415   CO2 26 10/08/2023 0638   CO2 29 07/17/2012 0438   CO2 30 06/14/2008 1415   GLUCOSE 93 10/08/2023 0638   GLUCOSE 119 (H) 07/17/2012 0438   GLUCOSE 91 06/14/2008 1415   BUN 25 (H) 10/08/2023 0638   BUN 18 06/15/2023 1152   BUN 11 07/17/2012 0438   BUN 20  06/14/2008 1415   CREATININE 0.92 10/08/2023 0638   CREATININE 0.97 (H) 11/08/2018 0806   CALCIUM  8.8 (L) 10/08/2023 0638   CALCIUM  8.9 07/17/2012 0438   CALCIUM  9.2 06/14/2008 1415   GFRNONAA >60 10/08/2023 0638   GFRNONAA 57 (L) 11/08/2018 0806   GFRAA >60 09/16/2019 0948   GFRAA 66 11/08/2018 0806    COAGS: Lab Results  Component Value Date   INR 1.0 10/07/2023   INR 1.1 10/20/2022   INR 1.1 10/20/2022     Non-Invasive Vascular Imaging:   EXAM:10/07/23 CT ANGIOGRAPHY CHEST, ABDOMEN AND PELVIS   TECHNIQUE: Non-contrast CT of the chest was initially obtained.   Multidetector CT imaging through the chest, abdomen and pelvis was performed using the standard protocol during bolus administration of intravenous contrast. Multiplanar reconstructed images and MIPs were obtained and reviewed to evaluate the vascular anatomy.   RADIATION DOSE REDUCTION: This exam was performed according to the departmental dose-optimization program which includes automated exposure control, adjustment of the mA and/or kV according to patient size and/or use of iterative reconstruction technique.   CONTRAST:  OMNIPAQUE  IOHEXOL  350 MG/ML SOLN   COMPARISON:  CT scan renal stone protocol from 10/24/2022 and CT scan chest from 08/06/2022.   FINDINGS: CTA CHEST FINDINGS   Cardiovascular: No intramural hematoma noted in the  thoracic aorta on the unenhanced images.   Thoracic aorta is normal in caliber without aneurysm, dissection, vasculitis or significant stenosis.   Normal cardiac size. No pericardial effusion. No aortic aneurysm. There are coronary artery calcifications, in keeping with coronary artery disease. There are also moderate peripheral atherosclerotic vascular calcifications of thoracic aorta and its major branches.   Mediastinum/Nodes: Visualized thyroid  gland appears grossly unremarkable. No solid / cystic mediastinal masses. The esophagus is nondistended precluding  optimal assessment. No axillary, mediastinal or hilar lymphadenopathy by size criteria.   Lungs/Pleura: The central tracheo-bronchial tree is patent. There are dependent changes as well as patchy areas of linear, plate-like atelectasis and/or scarring throughout bilateral lungs. No mass or consolidation. No pleural effusion or pneumothorax. No suspicious lung nodules.   Musculoskeletal: The visualized soft tissues of the chest wall are grossly unremarkable. No suspicious osseous lesions. There are moderate multilevel degenerative changes in the visualized spine, most pronounced at T6 through T8 levels. There is partial fusion of T7-T8 vertebrae. Mild anterior wedging deformity and prior vertebroplasty changes noted in the T12 vertebra. No significant retropulsion or spinal canal compromise.   Review of the MIP images confirms the above findings.   CTA ABDOMEN AND PELVIS FINDINGS   VASCULAR   Aorta: Normal caliber aorta without aneurysm, dissection, vasculitis or significant stenosis. Mild-to-moderate calcified and noncalcified plaque noted with largest amount of intraluminal thrombus/hematoma in the infrarenal aorta (series 5, images 176-178) causing moderate to severe luminal narrowing.   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 The artery is otherwise patent without evidence of aneurysm, dissection or vasculitis.   SMA: Patent without evidence of aneurysm, dissection, vasculitis or significant stenosis.   Renals: There is moderate narrowing at the origin of left renal artery (series 10, image 61). Both renal arteries are otherwise patent without evidence of aneurysm, dissection, vasculitis or fibromuscular dysplasia.   IMA: Patent without evidence of aneurysm, dissection, vasculitis or significant stenosis.   Inflow: Patent without evidence of aneurysm, dissection, vasculitis or significant stenosis.   Veins: No  obvious venous abnormality within the limitations of this arterial phase study.   Review of the MIP images confirms the above findings.   NON-VASCULAR   Hepatobiliary: The liver is normal in size. Non-cirrhotic configuration. No suspicious mass. No intrahepatic or extrahepatic bile duct dilation. Anatomic variant of Phrygian cap noted. No calcified gallstones. Normal gallbladder wall thickness. No pericholecystic inflammatory changes.   Pancreas: Unremarkable. No pancreatic ductal dilatation or surrounding inflammatory changes.   Spleen: Within normal limits. No focal lesion.   Adrenals/Urinary Tract: Adrenal glands are unremarkable. No suspicious renal mass. No hydronephrosis. No renal or ureteric calculi. Unremarkable urinary bladder.   Stomach/Bowel: There is a small sliding hiatal hernia. There is a small diverticulum arising from the second part of duodenum. No disproportionate dilation of the small or large bowel loops. No evidence of abnormal bowel wall thickening or inflammatory changes. The appendix is unremarkable. Mild-to-moderate amount of stool burden noted mainly in the left hemicolon. There is a large fecaloma in the rectum measuring up to 7.7 x 8.5 cm. There are multiple diverticula mainly in the left hemi colon, without imaging signs of diverticulitis.   Vascular/Lymphatic: No ascites or pneumoperitoneum. No abdominal or pelvic lymphadenopathy, by size criteria.   Reproductive: The uterus is surgically absent. No large adnexal mass.   Other: There is a tiny fat containing umbilical hernia. The soft tissues and abdominal wall are otherwise unremarkable.  Musculoskeletal: No suspicious osseous lesions. There are mild - moderate multilevel degenerative changes in the visualized spine.   Review of the MIP images confirms the above findings.   IMPRESSION: 1. No acute aortic syndrome. No aortic aneurysm, dissection or penetrating atherosclerotic ulcer. 2.  There is moderate-to-severe luminal narrowing of the infrarenal aorta secondary to intraluminal thrombus/hematoma. 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 3. Multiple other nonacute observations, as described above.   Aortic Atherosclerosis (ICD10-I70.0).  Statin:  Yes.   Beta Blocker:  Yes.   Aspirin :  No. ACEI:  No. ARB:  No. CCB use:  Yes Other antiplatelets/anticoagulants:  No.    ASSESSMENT/PLAN: This is a 83 y.o. female ***   -***   Kristiana Jacko R Cherlynn Popiel [facility] 10/08/2023 9:27 AM

## 2023-10-08 NOTE — Discharge Summary (Signed)
 Physician Discharge Summary  Helen Mccormick FMW:[id number] DOB: March 26, 1941 DOA: 10/07/2023  PCP: Fernand Fredy RAMAN, MD  Admit date: 10/07/2023 Discharge date: 10/08/2023  Admitted From: Home Disposition:  Home  Recommendations for Outpatient Follow-up:  Follow up with PCP in 1-2 weeks Vascular surgery follow-up as directed  Home Health: No Equipment/Devices: None  Discharge Condition: Stable CODE STATUS: Full Diet recommendation: Regular  Brief/Interim Summary:    An 83 year old female with history of hypertension, hypothyroid, non-insulin -dependent diabetes mellitus, hyperlipidemia, depression, hypertension, who presents emergency department for chief concerns of back pain.    At bedside, patient able to tell me her first and last name, age, location, current calendar year.   She reports that over the last month she has had 2-3 syncopal events at home.  These were witnessed by her sister.  She reports that she loses consciousness during these times.  Patient reports she gets prodromal dizziness and then does not know what is going on.     She denies trauma to her person during these episodes.  She denies chest pain, shortness of breath, dysuria, hematuria, diarrhea, nausea, vomiting, swelling of her lower extremities.   She reports before 1 month ago, these episodes did not happen.   She reports the first 2 episodes, she was outside in the hot sun.  However today, she was not outside in the hot sun.  She was in the ED triage.  Discharge Diagnoses:  Principal Problem:   Syncope Active Problems:   Hypertension associated with diabetes (HCC)   Depression   Hypothyroidism, postsurgical   Hyperlipidemia due to type 2 diabetes mellitus (HCC)   Obstructive sleep apnea syndrome   Nonischemic cardiomyopathy (HCC)   Mild intermittent asthma   Mixed hyperlipidemia   Coronary artery disease of native artery of native heart with stable angina pectoris (HCC)   Back pain  #  Syncope Supposedly multiple syncopal events.  Etiology is somewhat unclear.  I suspect this is largely vasovagal in nature.  Extensive imaging workup overall reassuring.  MRI brain without contrast normal.  EEG without epileptiform discharges.  No electrolyte abnormalities.  Echocardiogram reassuring as well. Plan: Patient was seen by physical therapy and Occupational Therapy.  Orthostatics negative.  Patient ambulating without presyncope or syncope.  She is stable for discharge home.  Will recommend outpatient follow-up with PCP.  Consider outpatient Holter monitor though no arrhythmias were captured while patient was admitted.  Patient is mildly bradycardic at rest.  Will recommend reduction in Coreg  dose from 12.5 mg twice daily to 6.25 mg twice daily.  Follow-up outpatient to discuss medication changes.   Workup has been discussed with patient's daughter and grandson via phone.  I have recommended keeping a home blood pressure log.     # Infrarenal aorta narrowing suspect intraluminal thrombus versus hematoma Essentially asymptomatic.  Incidental finding on CTA.  Case discussed at length with vascular surgery who graciously reviewed images.  No claudication symptoms to suggest urgent intervention is necessary.  Bilateral lower extremity none ABI ultrasound and venous ultrasounds have been ordered.  The studies are completed and pending at time of the discharge.  Vascular surgery to obtain clinic follow-up   Discharge Instructions  Discharge Instructions     Diet - low sodium heart healthy   Complete by: As directed    Increase activity slowly   Complete by: As directed       Allergies as of 10/08/2023       Reactions   Amlodipine  Hives, Swelling  Tramadol  Other (See Comments)   Dizziness   Isosorbide  Dinitrate    Other Reaction(s): Headache   Codeine Nausea Only   Etodolac Other (See Comments)   Other reaction(s): Abdominal Pain        Medication List     STOP taking  these medications    Accu-Chek Guide w/Device Kit   fluticasone  110 MCG/ACT inhaler Commonly known as: FLOVENT  HFA   meclizine  12.5 MG tablet Commonly known as: ANTIVERT    nitroGLYCERIN  0.4 MG SL tablet Commonly known as: NITROSTAT        TAKE these medications    Accu-Chek Softclix Lancets lancets Use as instructed   albuterol  108 (90 Base) MCG/ACT inhaler Commonly known as: VENTOLIN  HFA Inhale 2 puffs by mouth every 6 hours as needed for wheezing/shortness of breath   alendronate 70 MG tablet Commonly known as: FOSAMAX Take 70 mg by mouth.  Take 1 tablet (70 mg total) by mouth every 7 (seven) days Take on an empty stomach with a full glass of water. Avoid mineral or well water. Do not eat or take other medications for at least 30 minutes after dose. Sit or stand for at least 30 minutes after dose.   amLODipine  5 MG tablet Commonly known as: NORVASC  Take 5 mg by mouth daily.   Arnuity Ellipta  100 MCG/ACT Aepb Generic drug: Fluticasone  Furoate Inhale 1 puff by mouth every day   Azelastine HCl 137 MCG/SPRAY Soln Use 1 spray into each nostril every day as directed   busPIRone 15 MG tablet Commonly known as: BUSPAR Take 15 mg by mouth 2 (two) times daily.   carvedilol  6.25 MG tablet Commonly known as: COREG  Take 1 tablet (6.25 mg total) by mouth 2 (two) times daily. What changed:  medication strength how much to take   D3 25 MCG (1000 UT) capsule Generic drug: Cholecalciferol  Take 1,000 Units by mouth daily.   Entresto  97-103 MG Generic drug: sacubitril -valsartan  Take 1 tablet by mouth twice daily   escitalopram  10 MG tablet Commonly known as: LEXAPRO  Take 1 tablet by mouth every day   furosemide  20 MG tablet Commonly known as: LASIX  Take 20 mg by mouth daily.   gabapentin  100 MG capsule Commonly known as: NEURONTIN  Take 1 capsule by mouth 3 times a day   isosorbide  dinitrate 30 MG tablet Commonly known as: ISORDIL  Take 1 tablet by mouth every  day   Jardiance  25 MG Tabs tablet Generic drug: empagliflozin  Take 1 tablet by mouth every day   levothyroxine  75 MCG tablet Commonly known as: Synthroid  Take 1 tablet (75 mcg total) by mouth daily.   multivitamin tablet Take 1 tablet by mouth daily.   OneTouch Verio test strip Generic drug: glucose blood USE TO CHECK BLOOD SUGAR TWICE DAILY   oxycodone  5 MG capsule Commonly known as: OXY-IR Take 5 mg by mouth every 4 (four) hours as needed.   pantoprazole  40 MG tablet Commonly known as: PROTONIX  Take 1 tablet by mouth every day   polyethylene glycol powder 17 GM/SCOOP powder Commonly known as: GLYCOLAX /MIRALAX  Take 17 g by mouth.  Take 17 g by mouth once daily   Restasis  0.05 % ophthalmic emulsion Generic drug: cycloSPORINE    rosuvastatin  40 MG tablet Commonly known as: CRESTOR  Take 1 tablet by mouth every day        Follow-up Information     Fernand Fredy RAMAN, MD. Schedule an appointment as soon as possible for a visit in 1 week(s).   Specialty: Internal Medicine Contact information: [id number] [place] [address] [phone]                Allergies  Allergen Reactions   Amlodipine  Hives and Swelling   Tramadol  Other (See Comments)    Dizziness    Isosorbide  Dinitrate     Other Reaction(s): Headache   Codeine Nausea Only   Etodolac Other (See Comments)    Other reaction(s): Abdominal Pain    Consultations: Vascular surgery   Procedures/Studies: MR BRAIN WO CONTRAST Result Date: 10/08/2023 CLINICAL DATA:  Mental status change, unknown cause. Multiple syncopal episodes in the past month. EXAM: MRI HEAD WITHOUT CONTRAST TECHNIQUE: Multiplanar, multiecho pulse sequences of the brain and surrounding structures were obtained without intravenous contrast. COMPARISON:  CT head 10/07/2023.  MRI head 07/22/2020. FINDINGS: Brain: No acute infarct. No evidence of intracranial hemorrhage. T2/FLAIR hyperintensity in the periventricular and  subcortical white matter. No edema, mass effect, or midline shift. Posterior fossa is unremarkable. Normal appearance of midline structures. The basilar cisterns are patent. No extra-axial fluid collections. Ventricles: Normal size and configuration of the ventricles. Vascular: Skull base flow voids are visualized. Skull and upper cervical spine: The calvarium is unremarkable. Postsurgical changes in the visualized upper cervical spine. Sinuses/Orbits: Bilateral lens replacement. Mild mucosal thickening in the ethmoid sinuses. Other: Trace fluid in the right mastoid tip. IMPRESSION: No acute intracranial abnormality. Extensive chronic microvascular ischemic changes are overall similar to prior. Electronically Signed   By: Donnice Mania M.D.   On: 10/08/2023 15:57   EEG adult Result Date: 10/08/2023 Shelton Arlin KIDD, MD     10/08/2023  2:26 PM Patient Name: Armoni Kludt MRN: [id number] Epilepsy Attending: Arlin KIDD Shelton Referring Physician/Provider: Sherre Greig SAILOR, DO Date: 10/08/2023 Duration: 28.37 mins Patient history: 83yo F with syncope. EEG to evaluate for seizure Level of alertness: Awake, asleep AEDs during EEG study: None Technical aspects: This EEG study was done with scalp electrodes positioned according to the 10-20 International system of electrode placement. Electrical activity was reviewed with band pass filter of 1-70Hz , sensitivity of 7 uV/mm, display speed of 1mm/sec with a 60Hz  notched filter applied as appropriate. EEG data were recorded continuously and digitally stored.  Video monitoring was available and reviewed as appropriate. Description: The posterior dominant rhythm consists of 8-9 Hz activity of moderate voltage (25-35 uV) seen predominantly in posterior head regions, symmetric and reactive to eye opening and eye closing. Sleep was characterized by vertex waves, sleep spindles (12 to 14 Hz), maximal frontocentral region.  There is an excessive amount of 15 to 18 Hz beta activity  distributed symmetrically and diffusely. Hyperventilation and photic stimulation were not performed.   ABNORMALITY - Excessive beta, generalized IMPRESSION: This study is within normal limits. The excessive beta activity seen in the background is most likely due to the effect of benzodiazepine and is a benign EEG pattern. No seizures or epileptiform discharges were seen throughout the recording. A normal interictal EEG does not exclude the diagnosis of epilepsy. Arlin KIDD Shelton   ECHOCARDIOGRAM COMPLETE Result Date: 10/08/2023    ECHOCARDIOGRAM REPORT   Patient Name:   NAYELLY LAUGHMAN Date of Exam: 10/08/2023 Medical Rec #:  [id number]         Height:       64.0 in Accession #:    [id number]        Weight:       150.0 lb Date of Birth:  08/27/1940          BSA:  1.731 m Patient Age:    83 years          BP:           165/148 mmHg Patient Gender: F                 HR:           58 bpm. Exam Location:  [facility] Procedure: 2D Echo, Cardiac Doppler and Color Doppler (Both Spectral and Color            Flow Doppler were utilized during procedure). Indications:     Syncope R55  History:         Patient has prior history of Echocardiogram examinations, most                  recent 07/02/2022. Signs/Symptoms:Syncope.  Sonographer:     Ashley McNeely-Sloane Referring Phys:  [id number] AMY N COX Diagnosing Phys: Keller Alluri IMPRESSIONS  1. Left ventricular ejection fraction, by estimation, is 50 to 55%. The left ventricle has low normal function. The left ventricle has no regional wall motion abnormalities. There is mild left ventricular hypertrophy. Left ventricular diastolic parameters are consistent with Grade I diastolic dysfunction (impaired relaxation).  2. Right ventricular systolic function is normal. The right ventricular size is normal.  3. The mitral valve is normal in structure. Mild mitral valve regurgitation.  4. The aortic valve is tricuspid. There is mild thickening of the aortic valve. Aortic valve  regurgitation is not visualized.  5. The inferior vena cava is normal in size with greater than 50% respiratory variability, suggesting right atrial pressure of 3 mmHg. FINDINGS  Left Ventricle: Left ventricular ejection fraction, by estimation, is 50 to 55%. The left ventricle has low normal function. The left ventricle has no regional wall motion abnormalities. The left ventricular internal cavity size was normal in size. There is mild left ventricular hypertrophy. Abnormal (paradoxical) septal motion, consistent with left bundle branch block. Left ventricular diastolic parameters are consistent with Grade I diastolic dysfunction (impaired relaxation). Right Ventricle: The right ventricular size is normal. No increase in right ventricular wall thickness. Right ventricular systolic function is normal. Left Atrium: Left atrial size was normal in size. Right Atrium: Right atrial size was normal in size. Pericardium: There is no evidence of pericardial effusion. Mitral Valve: The mitral valve is normal in structure. Mild mitral valve regurgitation. MV peak gradient, 6.9 mmHg. The mean mitral valve gradient is 3.0 mmHg. Tricuspid Valve: The tricuspid valve is normal in structure. Tricuspid valve regurgitation is not demonstrated. Aortic Valve: The aortic valve is tricuspid. There is mild thickening of the aortic valve. Aortic valve regurgitation is not visualized. Aortic valve mean gradient measures 3.0 mmHg. Aortic valve peak gradient measures 6.1 mmHg. Aortic valve area, by VTI  measures 2.49 cm. Pulmonic Valve: The pulmonic valve was not well visualized. Pulmonic valve regurgitation is not visualized. Aorta: The aortic root and ascending aorta are structurally normal, with no evidence of dilitation. Venous: The inferior vena cava is normal in size with greater than 50% respiratory variability, suggesting right atrial pressure of 3 mmHg. IAS/Shunts: The atrial septum is grossly normal.  LEFT VENTRICLE PLAX 2D LVIDd:          4.90 cm      Diastology LVIDs:         3.60 cm      LV e' medial:    4.68 cm/s LV PW:         1.20 cm  LV E/e' medial:  18.7 LV IVS:        1.20 cm      LV e' lateral:   4.57 cm/s LVOT diam:     1.90 cm      LV E/e' lateral: 19.1 LV SV:         63 LV SV Index:   36 LVOT Area:     2.84 cm  LV Volumes (MOD) LV vol d, MOD A2C: 101.0 ml LV vol d, MOD A4C: 73.0 ml LV vol s, MOD A2C: 30.3 ml LV vol s, MOD A4C: 35.6 ml LV SV MOD A2C:     70.7 ml LV SV MOD A4C:     73.0 ml LV SV MOD BP:      52.0 ml RIGHT VENTRICLE             IVC RV Basal diam:  3.80 cm     IVC diam: 1.60 cm RV Mid diam:    2.00 cm RV S prime:     12.90 cm/s TAPSE (M-mode): 2.3 cm LEFT ATRIUM             Index        RIGHT ATRIUM           Index LA diam:        3.60 cm 2.08 cm/m   RA Area:     14.60 cm LA Vol (A2C):   56.8 ml 32.81 ml/m  RA Volume:   37.80 ml  21.83 ml/m LA Vol (A4C):   32.7 ml 18.89 ml/m LA Biplane Vol: 44.9 ml 25.94 ml/m  AORTIC VALVE                    PULMONIC VALVE AV Area (Vmax):    2.44 cm     PV Vmax:        1.11 m/s AV Area (Vmean):   2.33 cm     PV Vmean:       69.800 cm/s AV Area (VTI):     2.49 cm     PV VTI:         0.252 m AV Vmax:           123.00 cm/s  PV Peak grad:   4.9 mmHg AV Vmean:          81.100 cm/s  PV Mean grad:   2.0 mmHg AV VTI:            0.253 m      RVOT Peak grad: 1 mmHg AV Peak Grad:      6.1 mmHg AV Mean Grad:      3.0 mmHg LVOT Vmax:         106.00 cm/s LVOT Vmean:        66.600 cm/s LVOT VTI:          0.222 m LVOT/AV VTI ratio: 0.88  AORTA Ao Root diam: 3.60 cm Ao Asc diam:  3.70 cm MITRAL VALVE MV Area (PHT): 2.48 cm     SHUNTS MV Area VTI:   1.72 cm     Systemic VTI:  0.22 m MV Peak grad:  6.9 mmHg     Systemic Diam: 1.90 cm MV Mean grad:  3.0 mmHg     Pulmonic VTI:  0.116 m MV Vmax:       1.31 m/s MV Vmean:      80.4 cm/s MV Decel Time: 306 msec MV E velocity: 87.50 cm/s MV A velocity: 131.00  cm/s MV E/A ratio:  0.67 Keller Paterson Electronically signed by Keller Paterson  Signature Date/Time: 10/08/2023/9:19:53 AM    Final    CT Head Wo Contrast Result Date: 10/07/2023 CLINICAL DATA:  Mental status change, unknown cause. EXAM: CT HEAD WITHOUT CONTRAST TECHNIQUE: Contiguous axial images were obtained from the base of the skull through the vertex without intravenous contrast. RADIATION DOSE REDUCTION: This exam was performed according to the departmental dose-optimization program which includes automated exposure control, adjustment of the mA and/or kV according to patient size and/or use of iterative reconstruction technique. COMPARISON:  CT scan head from 08/11/2023. FINDINGS: Brain: No evidence of acute infarction, hemorrhage, hydrocephalus, extra-axial collection or mass lesion/mass effect. There is bilateral periventricular hypodensity, which is non-specific but most likely seen in the settings of microvascular ischemic changes. Moderate in extent. Otherwise normal appearance of brain parenchyma. Ventricles are normal. Cerebral volume is age appropriate. Vascular: No hyperdense vessel or unexpected calcification. Intracranial arteriosclerosis. Skull: Normal. Negative for fracture or focal lesion. Sinuses/Orbits: No acute finding. Other: Visualized mastoid air cells are unremarkable. No mastoid effusion. IMPRESSION: No acute intracranial abnormality. Electronically Signed   By: Ree Molt M.D.   On: 10/07/2023 12:30   CT Angio Chest/Abd/Pel for Dissection W and/or Wo Contrast Result Date: 10/07/2023 CLINICAL DATA:  Acute aortic syndrome (AAS) suspected. Patient states Hurting in my spine. Alert and oriented. EXAM: CT ANGIOGRAPHY CHEST, ABDOMEN AND PELVIS TECHNIQUE: Non-contrast CT of the chest was initially obtained. Multidetector CT imaging through the chest, abdomen and pelvis was performed using the standard protocol during bolus administration of intravenous contrast. Multiplanar reconstructed images and MIPs were obtained and reviewed to evaluate the vascular  anatomy. RADIATION DOSE REDUCTION: This exam was performed according to the departmental dose-optimization program which includes automated exposure control, adjustment of the mA and/or kV according to patient size and/or use of iterative reconstruction technique. CONTRAST:  OMNIPAQUE  IOHEXOL  350 MG/ML SOLN COMPARISON:  CT scan renal stone protocol from 10/24/2022 and CT scan chest from 08/06/2022. FINDINGS: CTA CHEST FINDINGS Cardiovascular: No intramural hematoma noted in the thoracic aorta on the unenhanced images. Thoracic aorta is normal in caliber without aneurysm, dissection, vasculitis or significant stenosis. Normal cardiac size. No pericardial effusion. No aortic aneurysm. There are coronary artery calcifications, in keeping with coronary artery disease. There are also moderate peripheral atherosclerotic vascular calcifications of thoracic aorta and its major branches. Mediastinum/Nodes: Visualized thyroid  gland appears grossly unremarkable. No solid / cystic mediastinal masses. The esophagus is nondistended precluding optimal assessment. No axillary, mediastinal or hilar lymphadenopathy by size criteria. Lungs/Pleura: The central tracheo-bronchial tree is patent. There are dependent changes as well as patchy areas of linear, plate-like atelectasis and/or scarring throughout bilateral lungs. No mass or consolidation. No pleural effusion or pneumothorax. No suspicious lung nodules. Musculoskeletal: The visualized soft tissues of the chest wall are grossly unremarkable. No suspicious osseous lesions. There are moderate multilevel degenerative changes in the visualized spine, most pronounced at T6 through T8 levels. There is partial fusion of T7-T8 vertebrae. Mild anterior wedging deformity and prior vertebroplasty changes noted in the T12 vertebra. No significant retropulsion or spinal canal compromise. Review of the MIP images confirms the above findings. CTA ABDOMEN AND PELVIS FINDINGS VASCULAR  Aorta: Normal caliber aorta without aneurysm, dissection, vasculitis or significant stenosis. Mild-to-moderate calcified and noncalcified plaque noted with largest amount of intraluminal thrombus/hematoma in the infrarenal aorta (series 5, images 176-178) causing moderate to severe luminal narrowing. 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 The artery is otherwise patent without evidence of aneurysm, dissection or vasculitis. SMA: Patent without evidence of aneurysm, dissection, vasculitis or significant stenosis. Renals: There is moderate narrowing at the origin of left renal artery (series 10, image 61). Both renal arteries are otherwise patent without evidence of aneurysm, dissection, vasculitis or fibromuscular dysplasia. IMA: Patent without evidence of aneurysm, dissection, vasculitis or significant stenosis. Inflow: Patent without evidence of aneurysm, dissection, vasculitis or significant stenosis. Veins: No obvious venous abnormality within the limitations of this arterial phase study. Review of the MIP images confirms the above findings. NON-VASCULAR Hepatobiliary: The liver is normal in size. Non-cirrhotic configuration. No suspicious mass. No intrahepatic or extrahepatic bile duct dilation. Anatomic variant of Phrygian cap noted. No calcified gallstones. Normal gallbladder wall thickness. No pericholecystic inflammatory changes. Pancreas: Unremarkable. No pancreatic ductal dilatation or surrounding inflammatory changes. Spleen: Within normal limits. No focal lesion. Adrenals/Urinary Tract: Adrenal glands are unremarkable. No suspicious renal mass. No hydronephrosis. No renal or ureteric calculi. Unremarkable urinary bladder. Stomach/Bowel: There is a small sliding hiatal hernia. There is a small diverticulum arising from the second part of duodenum. No disproportionate dilation of the small or large bowel loops. No evidence of abnormal bowel wall  thickening or inflammatory changes. The appendix is unremarkable. Mild-to-moderate amount of stool burden noted mainly in the left hemicolon. There is a large fecaloma in the rectum measuring up to 7.7 x 8.5 cm. There are multiple diverticula mainly in the left hemi colon, without imaging signs of diverticulitis. Vascular/Lymphatic: No ascites or pneumoperitoneum. No abdominal or pelvic lymphadenopathy, by size criteria. Reproductive: The uterus is surgically absent. No large adnexal mass. Other: There is a tiny fat containing umbilical hernia. The soft tissues and abdominal wall are otherwise unremarkable. Musculoskeletal: No suspicious osseous lesions. There are mild - moderate multilevel degenerative changes in the visualized spine. Review of the MIP images confirms the above findings. IMPRESSION: 1. No acute aortic syndrome. No aortic aneurysm, dissection or penetrating atherosclerotic ulcer. 2. There is moderate-to-severe luminal narrowing of the infrarenal aorta secondary to intraluminal thrombus/hematoma. 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 3. Multiple other nonacute observations, as described above. Aortic Atherosclerosis (ICD10-I70.0). Electronically Signed   By: Ree Molt M.D.   On: 10/07/2023 12:25   DG Chest Port 1 View Result Date: 10/07/2023 CLINICAL DATA:  Sepsis EXAM: PORTABLE CHEST 1 VIEW COMPARISON:  Chest radiograph dated 07/25/2023, CT chest dated 08/06/2022 FINDINGS: Normal lung volumes. Bibasilar patchy opacities. Nodular density projecting over the left apex corresponds to prominent degenerative change at the left first costochondral junction. No pleural effusion or pneumothorax. The heart size and mediastinal contours are within normal limits. No acute osseous abnormality. IMPRESSION: Bibasilar patchy opacities, which may represent atelectasis, aspiration, or pneumonia. Electronically Signed   By: Limin  Xu M.D.    On: 10/07/2023 11:27      Subjective: Seen and examined on the day of discharge.  Stable no distress.  Appropriate discharge home.  Discharge Exam: Vitals:   10/08/23 1500 10/08/23 1545  BP: (!) 106/58 (!) 128/58  Pulse: (!) 53 (!) 53  Resp:    Temp:  97.6 F (36.4 C)  SpO2: 100% 100%   Vitals:   10/08/23 1224 10/08/23 1300 10/08/23 1500 10/08/23 1545  BP: (!) 94/53 (!) 82/32 (!) 106/58 (!) 128/58  Pulse: 64 (!) 56 (!) 53 (!) 53  Resp: 16     Temp: 98.6  F (37 C)   97.6 F (36.4 C)  TempSrc: Oral   Oral  SpO2: 97% 100% 100% 100%  Weight:      Height:        General: Pt is alert, awake, not in acute distress Cardiovascular: RRR, S1/S2 +, no rubs, no gallops Respiratory: CTA bilaterally, no wheezing, no rhonchi Abdominal: Soft, NT, ND, bowel sounds + Extremities: no edema, no cyanosis    The results of significant diagnostics from this hospitalization (including imaging, microbiology, ancillary and laboratory) are listed below for reference.     Microbiology: Recent Results (from the past 240 hours)  Blood Culture (routine x 2)     Status: None (Preliminary result)   Collection Time: 10/07/23 11:15 AM   Specimen: BLOOD  Result Value Ref Range Status   Specimen Description   Final    BLOOD LEFT ANTECUBITAL Performed at [facility], [address], [place], [place] [address]    Special Requests   Final    BOTTLES DRAWN AEROBIC AND ANAEROBIC Blood Culture results may not be optimal due to an inadequate volume of blood received in culture bottles Performed at [facility], [address] [place], [place] [address]    Culture   Final    NO GROWTH 1 DAY Performed at [facility], [address], [place], [place] [address]    Report Status PENDING  Incomplete     Labs: BNP (last 3 results) No results for input(s): BNP in the last 8760 hours. Basic Metabolic Panel: Recent Labs  Lab 10/07/23 1115 10/08/23 0638  NA 142 140  K  4.0 3.7  CL 108 106  CO2 22 26  GLUCOSE 120* 93  BUN 29* 25*  CREATININE 0.99 0.92  CALCIUM  9.2 8.8*   Liver Function Tests: Recent Labs  Lab 10/07/23 1115  AST 21  ALT 15  ALKPHOS 69  BILITOT 0.7  PROT 6.4*  ALBUMIN 3.5   Recent Labs  Lab 10/07/23 1115  LIPASE 26   No results for input(s): AMMONIA in the last 168 hours. CBC: Recent Labs  Lab 10/07/23 1115 10/08/23 0638  WBC 6.3 6.3  NEUTROABS 2.6  --   HGB 11.5* 10.8*  HCT 36.3 33.1*  MCV 77.6* 77.0*  PLT 192 169   Cardiac Enzymes: No results for input(s): CKTOTAL, CKMB, CKMBINDEX, TROPONINI in the last 168 hours. BNP: Invalid input(s): POCBNP CBG: Recent Labs  Lab 10/08/23 0502 10/08/23 0749  GLUCAP 100* 90   D-Dimer No results for input(s): DDIMER in the last 72 hours. Hgb A1c No results for input(s): HGBA1C in the last 72 hours. Lipid Profile No results for input(s): CHOL, HDL, LDLCALC, TRIG, CHOLHDL, LDLDIRECT in the last 72 hours. Thyroid  function studies No results for input(s): TSH, T4TOTAL, T3FREE, THYROIDAB in the last 72 hours.  Invalid input(s): FREET3 Anemia work up No results for input(s): VITAMINB12, FOLATE, FERRITIN, TIBC, IRON, RETICCTPCT in the last 72 hours. Urinalysis    Component Value Date/Time   COLORURINE COLORLESS (A) 10/07/2023 1825   APPEARANCEUR CLEAR (A) 10/07/2023 1825   LABSPEC 1.024 10/07/2023 1825   PHURINE 5.0 10/07/2023 1825   GLUCOSEU >=500 (A) 10/07/2023 1825   HGBUR NEGATIVE 10/07/2023 1825   BILIRUBINUR NEGATIVE 10/07/2023 1825   BILIRUBINUR Negative 07/30/2023 0928   KETONESUR NEGATIVE 10/07/2023 1825   PROTEINUR NEGATIVE 10/07/2023 1825   UROBILINOGEN 0.2 07/30/2023 0928   NITRITE NEGATIVE 10/07/2023 1825   LEUKOCYTESUR NEGATIVE 10/07/2023 1825   Sepsis Labs Recent  Labs  Lab 10/07/23 1115 10/08/23 0638  WBC 6.3 6.3   Microbiology Recent Results (from the past 240 hours)  Blood Culture  (routine x 2)     Status: None (Preliminary result)   Collection Time: 10/07/23 11:15 AM   Specimen: BLOOD  Result Value Ref Range Status   Specimen Description   Final    BLOOD LEFT ANTECUBITAL Performed at [facility], [address] [place], [place] [address]    Special Requests   Final    BOTTLES DRAWN AEROBIC AND ANAEROBIC Blood Culture results may not be optimal due to an inadequate volume of blood received in culture bottles Performed at [facility], [address] [place], [place] [address]    Culture   Final    NO GROWTH 1 DAY Performed at [facility], [address], [place], [place] [address]    Report Status PENDING  Incomplete     Time coordinating discharge: 40 minutes  SIGNED:   Calvin KATHEE Robson, MD  [facility] 10/08/2023, 4:21 PM Pager   If 7PM-7AM, please contact night-coverage

## 2023-10-08 NOTE — ED Notes (Signed)
Pt ambulated to the bathroom, NAD

## 2023-10-08 NOTE — Progress Notes (Signed)
 Just spoke with daughter, Lore Polka, who is arriving in 5 minutes. Family updated.  All questions answered.

## 2023-10-11 ENCOUNTER — Ambulatory Visit (INDEPENDENT_AMBULATORY_CARE_PROVIDER_SITE_OTHER): Admitting: Podiatry

## 2023-10-11 DIAGNOSIS — Z91199 Patient's noncompliance with other medical treatment and regimen due to unspecified reason: Secondary | ICD-10-CM

## 2023-10-11 NOTE — Progress Notes (Signed)
 1. No-show for appointment

## 2023-10-12 LAB — CULTURE, BLOOD (ROUTINE X 2): Culture: NO GROWTH

## 2023-10-25 ENCOUNTER — Ambulatory Visit: Payer: Self-pay | Admitting: Internal Medicine

## 2023-10-25 ENCOUNTER — Encounter: Payer: Self-pay | Admitting: Internal Medicine

## 2023-10-25 ENCOUNTER — Ambulatory Visit (INDEPENDENT_AMBULATORY_CARE_PROVIDER_SITE_OTHER): Admitting: Internal Medicine

## 2023-10-25 VITALS — BP 156/74 | HR 64 | Ht 64.0 in | Wt 154.6 lb

## 2023-10-25 DIAGNOSIS — E119 Type 2 diabetes mellitus without complications: Secondary | ICD-10-CM

## 2023-10-25 DIAGNOSIS — I152 Hypertension secondary to endocrine disorders: Secondary | ICD-10-CM | POA: Diagnosis not present

## 2023-10-25 DIAGNOSIS — E782 Mixed hyperlipidemia: Secondary | ICD-10-CM | POA: Diagnosis not present

## 2023-10-25 DIAGNOSIS — E038 Other specified hypothyroidism: Secondary | ICD-10-CM | POA: Diagnosis not present

## 2023-10-25 DIAGNOSIS — E1169 Type 2 diabetes mellitus with other specified complication: Secondary | ICD-10-CM | POA: Diagnosis not present

## 2023-10-25 DIAGNOSIS — E1159 Type 2 diabetes mellitus with other circulatory complications: Secondary | ICD-10-CM

## 2023-10-25 DIAGNOSIS — E559 Vitamin D deficiency, unspecified: Secondary | ICD-10-CM | POA: Diagnosis not present

## 2023-10-25 LAB — POCT CBG (FASTING - GLUCOSE)-MANUAL ENTRY: Glucose Fasting, POC: 140 mg/dL — AB (ref 70–99)

## 2023-10-25 NOTE — Progress Notes (Signed)
 Established Patient Office Visit  Subjective:  Patient ID: Helen Mccormick, female    DOB: [birth year]  Age: 83 y.o. MRN: [id number]  Chief Complaint  Patient presents with   Follow-up    3 month follow up    Patient comes in for her follow-up today.  She is generally feeling well and has no new complaints.  However her blood pressure is high as she was running late and did not take her medications.  She was recently in the emergency room for upper back pain.  Her imaging studies were unremarkable.  She was also recently evaluated by neurology for cognitive impairment but was declared to be normal. Patient advised to get her labs done today and then to go home and take her blood pressure medicine.  She will monitor her blood pressure at home and bring all her medications with her at her next visit in 2 weeks.  Will adjust her medications if needed. Denies headache or dizziness, no nausea or vomiting, no chest pain and no palpitations.    No other concerns at this time.   Past Medical History:  Diagnosis Date   Allergy    Anxiety    CHF (congestive heart failure) (HCC)    Colon polyp    Depression    GERD (gastroesophageal reflux disease)    Heart murmur    Hyperlipidemia    Hypertension    Hypothyroidism    Sleep apnea    Thyroid  disease     Past Surgical History:  Procedure Laterality Date   ABDOMINAL HYSTERECTOMY     BACK SURGERY     BACK SURGERY     CERVICAL FUSION     COLONOSCOPY WITH PROPOFOL  N/A 05/28/2017   Procedure: COLONOSCOPY WITH PROPOFOL ;  Surgeon: Therisa Bi, MD;  Location: [facility] ENDOSCOPY;  Service: Gastroenterology;  Laterality: N/A;   COLONOSCOPY WITH PROPOFOL  N/A 11/17/2019   Procedure: COLONOSCOPY WITH PROPOFOL ;  Surgeon: Maryruth Ole DASEN, MD;  Location: [facility] ENDOSCOPY;  Service: Endoscopy;  Laterality: N/A;   COLONOSCOPY WITH PROPOFOL  N/A 04/20/2023   Procedure: COLONOSCOPY WITH PROPOFOL ;  Surgeon: Therisa Bi, MD;  Location: [facility] ENDOSCOPY;   Service: Gastroenterology;  Laterality: N/A;   ESOPHAGOGASTRODUODENOSCOPY (EGD) WITH PROPOFOL  N/A 11/17/2019   Procedure: ESOPHAGOGASTRODUODENOSCOPY (EGD) WITH PROPOFOL ;  Surgeon: Maryruth Ole DASEN, MD;  Location: [facility] ENDOSCOPY;  Service: Endoscopy;  Laterality: N/A;   ESOPHAGOGASTRODUODENOSCOPY (EGD) WITH PROPOFOL  N/A 04/20/2023   Procedure: ESOPHAGOGASTRODUODENOSCOPY (EGD) WITH PROPOFOL ;  Surgeon: Therisa Bi, MD;  Location: [facility] ENDOSCOPY;  Service: Gastroenterology;  Laterality: N/A;   IR KYPHO THORACIC WITH BONE BIOPSY  10/20/2022   IR RADIOLOGIST EVAL & MGMT  09/22/2022   NECK SURGERY     POLYPECTOMY  04/20/2023   Procedure: POLYPECTOMY;  Surgeon: Therisa Bi, MD;  Location: [facility] ENDOSCOPY;  Service: Gastroenterology;;   THYROID  SURGERY     THYROID  SURGERY      Social History   Socioeconomic History   Marital status: Single    Spouse name: Not on file   Number of children: Not on file   Years of education: Not on file   Highest education level: 10th grade  Occupational History   Occupation: retired  Tobacco Use   Smoking status: Never   Smokeless tobacco: Former    Types: Associate Professor status: Never Used  Substance and Sexual Activity   Alcohol use: No   Drug use: No   Sexual activity: Not Currently  Other Topics Concern  Not on file  Social History Narrative   Not on file   Social Drivers of Health   Financial Resource Strain: Low Risk  (09/15/2023)   Received from [facility]   Overall Financial Resource Strain (CARDIA)    Difficulty of Paying Living Expenses: Not hard at all  Food Insecurity: No Food Insecurity (09/15/2023)   Received [facility]   Hunger Vital Sign    Within the past 12 months, you worried that your food would run out before you got the money to buy more.: Never true    Within the past 12 months, the food you bought just didn't last and you didn't have money to get more.: Never true   Transportation Needs: No Transportation Needs (09/15/2023)   Received from [facility] - Transportation    In the past 12 months, has lack of transportation kept you from medical appointments or from getting medications?: No    Lack of Transportation (Non-Medical): No  Physical Activity: Sufficiently Active (12/20/2018)   Exercise Vital Sign    Days of Exercise per Week: 4 days    Minutes of Exercise per Session: 60 min  Stress: No Stress Concern Present (03/12/2017)   [employer] of Occupational Health - Occupational Stress Questionnaire    Feeling of Stress : Only a little  Social Connections: Moderately Integrated (04/27/2017)   Social Connection and Isolation Panel    Frequency of Communication with Friends and Family: More than three times a week    Frequency of Social Gatherings with Friends and Family: Once a week    Attends Religious Services: More than 4 times per year    Active Member of [employer] or Organizations: Yes    Attends Banker Meetings: More than 4 times per year    Marital Status: Never married  Intimate Partner Violence: Unknown (03/12/2017)   Humiliation, Afraid, Rape, and Kick questionnaire    Fear of Current or Ex-Partner: Patient declined    Emotionally Abused: Patient declined    Physically Abused: Patient declined    Sexually Abused: Patient declined    Family History  Problem Relation Age of Onset   Breast cancer Maternal Aunt 29   Diabetes Mother    Leukemia Mother    Heart Problems Mother    Diabetes Sister    Mental illness Sister    Colon polyps Sister    Stroke Father    Cancer Brother        rectal   Healthy Daughter    Hypertension Son    Diabetes Sister    Thyroid  disease Sister    Healthy Sister    Healthy Sister    Stroke Brother    Healthy Son    Healthy Son    Healthy Daughter     Allergies  Allergen Reactions   Amlodipine  Hives and Swelling   Tramadol  Other (See Comments)     Dizziness    Isosorbide  Dinitrate     Other Reaction(s): Headache   Codeine Nausea Only   Etodolac Other (See Comments)    Other reaction(s): Abdominal Pain    Outpatient Medications Prior to Visit  Medication Sig   Accu-Chek Softclix Lancets lancets Use as instructed   albuterol  (VENTOLIN  HFA) 108 (90 Base) MCG/ACT inhaler Inhale 2 puffs by mouth every 6 hours as needed for wheezing/shortness of breath   alendronate (FOSAMAX) 70 MG tablet Take 70 mg by mouth.  Take 1 tablet (70  mg total) by mouth every 7 (seven) days Take on an empty stomach with a full glass of water. Avoid mineral or well water. Do not eat or take other medications for at least 30 minutes after dose. Sit or stand for at least 30 minutes after dose.   amLODipine  (NORVASC ) 5 MG tablet Take 5 mg by mouth daily.   ARNUITY ELLIPTA  100 MCG/ACT AEPB Inhale 1 puff by mouth every day   Azelastine HCl 137 MCG/SPRAY SOLN Use 1 spray into each nostril every day as directed   carvedilol  (COREG ) 6.25 MG tablet Take 1 tablet (6.25 mg total) by mouth 2 (two) times daily.   Cholecalciferol  (D3) 25 MCG (1000 UT) capsule Take 1,000 Units by mouth daily.   ENTRESTO  97-103 MG Take 1 tablet by mouth twice daily   escitalopram  (LEXAPRO ) 10 MG tablet Take 1 tablet by mouth every day   furosemide  (LASIX ) 20 MG tablet Take 20 mg by mouth daily.   gabapentin  (NEURONTIN ) 100 MG capsule Take 1 capsule by mouth 3 times a day   isosorbide  dinitrate (ISORDIL ) 30 MG tablet Take 1 tablet by mouth every day   JARDIANCE  25 MG TABS tablet Take 1 tablet by mouth every day   levothyroxine  (SYNTHROID ) 75 MCG tablet Take 1 tablet (75 mcg total) by mouth daily.   Multiple Vitamin (MULTIVITAMIN) tablet Take 1 tablet by mouth daily.   ONETOUCH VERIO test strip USE TO CHECK BLOOD SUGAR TWICE DAILY   oxycodone  (OXY-IR) 5 MG capsule Take 5 mg by mouth every 4 (four) hours as needed.   pantoprazole  (PROTONIX ) 40 MG tablet Take 1 tablet by mouth every day    polyethylene glycol powder (GLYCOLAX /MIRALAX ) 17 GM/SCOOP powder Take 17 g by mouth.  Take 17 g by mouth once daily   RESTASIS  0.05 % ophthalmic emulsion    rosuvastatin  (CRESTOR ) 40 MG tablet Take 1 tablet by mouth every day   busPIRone (BUSPAR) 15 MG tablet Take 15 mg by mouth 2 (two) times daily. (Patient not taking: Reported on 10/25/2023)   No facility-administered medications prior to visit.    Review of Systems  Constitutional: Negative.  Negative for chills, fever, malaise/fatigue and weight loss.  HENT: Negative.  Negative for sore throat.   Eyes: Negative.   Respiratory: Negative.  Negative for cough and shortness of breath.   Cardiovascular: Negative.  Negative for chest pain, palpitations and leg swelling.  Gastrointestinal: Negative.  Negative for abdominal pain, constipation, diarrhea, heartburn, nausea and vomiting.  Genitourinary: Negative.  Negative for dysuria and flank pain.  Musculoskeletal: Negative.  Negative for joint pain and myalgias.  Skin: Negative.   Neurological: Negative.  Negative for dizziness, tingling and headaches.  Endo/Heme/Allergies: Negative.   Psychiatric/Behavioral: Negative.  Negative for depression and suicidal ideas. The patient is not nervous/anxious.        Objective:   BP (!) 156/74   Pulse 64   Ht 5' 4 (1.626 m)   Wt 154 lb 9.6 oz (70.1 kg)   LMP  (LMP Unknown)   SpO2 96%   BMI 26.54 kg/m   Vitals:   10/25/23 1041  BP: (!) 156/74  Pulse: 64  Height: 5' 4 (1.626 m)  Weight: 154 lb 9.6 oz (70.1 kg)  SpO2: 96%  BMI (Calculated): 26.52    Physical Exam Vitals and nursing note reviewed.  Constitutional:      Appearance: Normal appearance.  HENT:     Head: Normocephalic and atraumatic.     Nose: Nose normal.  Mouth/Throat:     Mouth: Mucous membranes are moist.     Pharynx: Oropharynx is clear.  Eyes:     Conjunctiva/sclera: Conjunctivae normal.     Pupils: Pupils are equal, round, and reactive to light.   Cardiovascular:     Rate and Rhythm: Normal rate and regular rhythm.     Pulses: Normal pulses.     Heart sounds: Normal heart sounds. No murmur heard. Pulmonary:     Effort: Pulmonary effort is normal.     Breath sounds: Normal breath sounds. No wheezing.  Abdominal:     General: Bowel sounds are normal.     Palpations: Abdomen is soft.     Tenderness: There is no abdominal tenderness. There is no right CVA tenderness or left CVA tenderness.  Musculoskeletal:        General: Normal range of motion.     Cervical back: Normal range of motion.     Right lower leg: No edema.     Left lower leg: No edema.  Skin:    General: Skin is warm and dry.  Neurological:     General: No focal deficit present.     Mental Status: She is alert and oriented to person, place, and time.  Psychiatric:        Mood and Affect: Mood normal.        Behavior: Behavior normal.      Results for orders placed or performed in visit on 10/25/23  POCT CBG (Fasting - Glucose)  Result Value Ref Range   Glucose Fasting, POC 140 (A) 70 - 99 mg/dL    Recent Results (from the past 2160 hours)  POCT Urinalysis Dipstick (18997)     Status: Abnormal   Collection Time: 07/30/23  9:28 AM  Result Value Ref Range   Color, UA     Clarity, UA     Glucose, UA Positive (A) Negative   Bilirubin, UA Negative    Ketones, UA Negative    Spec Grav, UA 1.025 1.010 - 1.025   Blood, UA Negative    pH, UA 5.5 5.0 - 8.0   Protein, UA Positive (A) Negative   Urobilinogen, UA 0.2 0.2 or 1.0 E.U./dL   Nitrite, UA Negative    Leukocytes, UA Negative Negative   Appearance     Odor    Urine Culture     Status: Abnormal   Collection Time: 07/30/23  9:37 AM   Specimen: Urine   UR  Result Value Ref Range   Urine Culture, Routine Final report (A)    Organism ID, Bacteria Enterococcus faecalis (A)     Comment: Enterococci susceptible to penicillin are predictably susceptible to ampicillin, amoxicillin ,  ampicillin-sulbactam, amoxicillin -clavulanate, and piperacillin-tazobactam for non-beta-lactamase producing enterococci. (CLSI 2018) For Enterococcus species, aminoglycosides (except for high-level resistance screening), cephalosporins, clindamycin, and trimethoprim-sulfamethoxazole are not effective clinically. (CLSI, M100-S26, 2016) 10,000-25,000 colony forming units per mL    ORGANISM ID, BACTERIA Klebsiella aerogenes (A)     Comment: Some Enterobacterales may develop resistance during therapy with third-generation cephalosporins. This resistance is most commonly seen with Citrobacter freundii complex, Enterobacter cloacae complex, and Klebsiella aerogenes. Isolates that initially test susceptible may become resistant within a few days after initiation of therapy. Testing subsequent isolates may be warranted if clinically indicated. (CLSI M100-Ed33) Multi-Drug Resistant Organism 10,000-25,000 colony forming units per mL    Antimicrobial Susceptibility Comment     Comment:       ** S = Susceptible; I = Intermediate; R =  Resistant **                    P = Positive; N = Negative             MICS are expressed in micrograms per mL    Antibiotic                 RSLT#1    RSLT#2    RSLT#3    RSLT#4 Amoxicillin /Clavulanic Acid              R Cefepime                                 S Cefoxitin                                R Cefpodoxime                              S Ceftriaxone                              S Ciprofloxacin                   S         S Ertapenem                                S Gentamicin                               S Levofloxacin                   S         S Nitrofurantoin                 S         I Penicillin                     S Tetracycline                   R         S Tobramycin                                S Trimethoprim/Sulfa                       S Vancomycin                     S   Comprehensive metabolic panel     Status: Abnormal   Collection  Time: 08/11/23 11:01 AM  Result Value Ref Range   Sodium 142 135 - 145 mmol/L   Potassium 3.7 3.5 - 5.1 mmol/L   Chloride 110 98 - 111 mmol/L   CO2 25 22 - 32 mmol/L   Glucose, Bld 100 (H) 70 - 99 mg/dL    Comment: Glucose reference range applies only to samples taken after fasting for at least 8 hours.   BUN 29 (H) 8 - 23 mg/dL   Creatinine, Ser  0.77 0.44 - 1.00 mg/dL   Calcium  8.6 (L) 8.9 - 10.3 mg/dL   Total Protein 6.9 6.5 - 8.1 g/dL   Albumin 3.5 3.5 - 5.0 g/dL   AST 22 15 - 41 U/L   ALT 17 0 - 44 U/L   Alkaline Phosphatase 72 38 - 126 U/L   Total Bilirubin 0.7 0.0 - 1.2 mg/dL   GFR, Estimated >39 >39 mL/min    Comment: (NOTE) Calculated using the CKD-EPI Creatinine Equation (2021)    Anion gap 7 5 - 15    Comment: Performed at [facility], [address] [place], [place] [address]  CBC     Status: Abnormal   Collection Time: 08/11/23 11:01 AM  Result Value Ref Range   WBC 5.3 4.0 - 10.5 K/uL   RBC 4.69 3.87 - 5.11 MIL/uL   Hemoglobin 11.2 (L) 12.0 - 15.0 g/dL   HCT 63.7 63.9 - 53.9 %   MCV 77.2 (L) 80.0 - 100.0 fL   MCH 23.9 (L) 26.0 - 34.0 pg   MCHC 30.9 30.0 - 36.0 g/dL   RDW 82.7 (H) 88.4 - 84.4 %   Platelets 219 150 - 400 K/uL   nRBC 0.0 0.0 - 0.2 %    Comment: Performed at [facility], [address], [place], [place] [address]  Troponin I (High Sensitivity)     Status: None   Collection Time: 08/11/23 11:01 AM  Result Value Ref Range   Troponin I (High Sensitivity) 5 <18 ng/L    Comment: (NOTE) Elevated high sensitivity troponin I (hsTnI) values and significant  changes across serial measurements may suggest ACS but many other  chronic and acute conditions are known to elevate hsTnI results.  Refer to the Links section for chest pain algorithms and additional  guidance. Performed at [facility], [address] [place], [place] [address]   Urinalysis, w/ Reflex to Culture (Infection Suspected) -Urine, Clean Catch      Status: Abnormal   Collection Time: 08/11/23 11:01 AM  Result Value Ref Range   Specimen Source URINE, CLEAN CATCH    Color, Urine YELLOW (A) YELLOW   APPearance CLEAR (A) CLEAR   Specific Gravity, Urine 1.033 (H) 1.005 - 1.030   pH 5.0 5.0 - 8.0   Glucose, UA >=500 (A) NEGATIVE mg/dL   Hgb urine dipstick NEGATIVE NEGATIVE   Bilirubin Urine NEGATIVE NEGATIVE   Ketones, ur NEGATIVE NEGATIVE mg/dL   Protein, ur NEGATIVE NEGATIVE mg/dL   Nitrite NEGATIVE NEGATIVE   Leukocytes,Ua NEGATIVE NEGATIVE   RBC / HPF 0-5 0 - 5 RBC/hpf   WBC, UA 0-5 0 - 5 WBC/hpf    Comment:        Reflex urine culture not performed if WBC <=10, OR if Squamous epithelial cells >5. If Squamous epithelial cells >5 suggest recollection.    Bacteria, UA NONE SEEN NONE SEEN   Squamous Epithelial / HPF 0-5 0 - 5 /HPF    Comment: Performed at [facility], [address] [place], [place] [address]  Troponin I (High Sensitivity)     Status: None   Collection Time: 08/11/23 12:59 PM  Result Value Ref Range   Troponin I (High Sensitivity) 6 <18 ng/L    Comment: (NOTE) Elevated high sensitivity troponin I (hsTnI) values and significant  changes across serial measurements may suggest ACS but many other  chronic and acute conditions are known to elevate hsTnI results.  Refer to the Links section for chest  pain algorithms and additional  guidance. Performed at [facility], [address] [place], [place] [address]   Lactic acid, plasma     Status: None   Collection Time: 10/07/23 11:09 AM  Result Value Ref Range   Lactic Acid, Venous 1.7 0.5 - 1.9 mmol/L    Comment: Performed at [facility], [address] [place], [place] [address]  Comprehensive metabolic panel     Status: Abnormal   Collection Time: 10/07/23 11:15 AM  Result Value Ref Range   Sodium 142 135 - 145 mmol/L   Potassium 4.0 3.5 - 5.1 mmol/L   Chloride 108 98 - 111 mmol/L   CO2 22 22 - 32 mmol/L   Glucose,  Bld 120 (H) 70 - 99 mg/dL    Comment: Glucose reference range applies only to samples taken after fasting for at least 8 hours.   BUN 29 (H) 8 - 23 mg/dL   Creatinine, Ser 9.00 0.44 - 1.00 mg/dL   Calcium  9.2 8.9 - 10.3 mg/dL   Total Protein 6.4 (L) 6.5 - 8.1 g/dL   Albumin 3.5 3.5 - 5.0 g/dL   AST 21 15 - 41 U/L   ALT 15 0 - 44 U/L   Alkaline Phosphatase 69 38 - 126 U/L   Total Bilirubin 0.7 0.0 - 1.2 mg/dL   GFR, Estimated 57 (L) >60 mL/min    Comment: (NOTE) Calculated using the CKD-EPI Creatinine Equation (2021)    Anion gap 12 5 - 15    Comment: Performed at [facility] [address] [place], [place] [address]  CBC with Differential     Status: Abnormal   Collection Time: 10/07/23 11:15 AM  Result Value Ref Range   WBC 6.3 4.0 - 10.5 K/uL   RBC 4.68 3.87 - 5.11 MIL/uL   Hemoglobin 11.5 (L) 12.0 - 15.0 g/dL   HCT 63.6 63.9 - 53.9 %   MCV 77.6 (L) 80.0 - 100.0 fL   MCH 24.6 (L) 26.0 - 34.0 pg   MCHC 31.7 30.0 - 36.0 g/dL   RDW 82.6 (H) 88.4 - 84.4 %   Platelets 192 150 - 400 K/uL   nRBC 0.0 0.0 - 0.2 %   Neutrophils Relative % 42 %   Neutro Abs 2.6 1.7 - 7.7 K/uL   Lymphocytes Relative 51 %   Lymphs Abs 3.2 0.7 - 4.0 K/uL   Monocytes Relative 5 %   Monocytes Absolute 0.3 0.1 - 1.0 K/uL   Eosinophils Relative 2 %   Eosinophils Absolute 0.1 0.0 - 0.5 K/uL   Basophils Relative 0 %   Basophils Absolute 0.0 0.0 - 0.1 K/uL   Immature Granulocytes 0 %   Abs Immature Granulocytes 0.02 0.00 - 0.07 K/uL    Comment: Performed at [facility], [address] [place], [place] [address]  Protime-INR     Status: None   Collection Time: 10/07/23 11:15 AM  Result Value Ref Range   Prothrombin Time 13.7 11.4 - 15.2 seconds   INR 1.0 0.8 - 1.2    Comment: (NOTE) INR goal varies based on device and disease states. Performed at [facility], [address] [place], [place] [address]   Blood Culture (routine x 2)     Status: None   Collection  Time: 10/07/23 11:15 AM   Specimen: BLOOD  Result Value Ref Range   Specimen Description BLOOD LEFT ANTECUBITAL    Special Requests      BOTTLES DRAWN AEROBIC  AND ANAEROBIC Blood Culture results may not be optimal due to an inadequate volume of blood received in culture bottles   Culture      NO GROWTH 5 DAYS Performed at [facility], [address] [place], [place] [address]    Report Status 10/12/2023 FINAL   Lipase, blood     Status: None   Collection Time: 10/07/23 11:15 AM  Result Value Ref Range   Lipase 26 11 - 51 U/L    Comment: Performed at [facility], [address] [place], [place] [address]  Troponin I (High Sensitivity)     Status: None   Collection Time: 10/07/23 11:15 AM  Result Value Ref Range   Troponin I (High Sensitivity) 6 <18 ng/L    Comment: (NOTE) Elevated high sensitivity troponin I (hsTnI) values and significant  changes across serial measurements may suggest ACS but many other  chronic and acute conditions are known to elevate hsTnI results.  Refer to the Links section for chest pain algorithms and additional  guidance. Performed at [facility], [address] [place], [place] [address]   Troponin I (High Sensitivity)     Status: None   Collection Time: 10/07/23  2:30 PM  Result Value Ref Range   Troponin I (High Sensitivity) 6 <18 ng/L    Comment: (NOTE) Elevated high sensitivity troponin I (hsTnI) values and significant  changes across serial measurements may suggest ACS but many other  chronic and acute conditions are known to elevate hsTnI results.  Refer to the Links section for chest pain algorithms and additional  guidance. Performed at [facility], [address] [place], [place] [address]   Urinalysis, w/ Reflex to Culture (Infection Suspected) -Urine, Clean Catch     Status: Abnormal   Collection Time: 10/07/23  6:25 PM  Result Value Ref Range   Specimen Source URINE, CLEAN CATCH     Color, Urine COLORLESS (A) YELLOW   APPearance CLEAR (A) CLEAR   Specific Gravity, Urine 1.024 1.005 - 1.030   pH 5.0 5.0 - 8.0   Glucose, UA >=500 (A) NEGATIVE mg/dL   Hgb urine dipstick NEGATIVE NEGATIVE   Bilirubin Urine NEGATIVE NEGATIVE   Ketones, ur NEGATIVE NEGATIVE mg/dL   Protein, ur NEGATIVE NEGATIVE mg/dL   Nitrite NEGATIVE NEGATIVE   Leukocytes,Ua NEGATIVE NEGATIVE   RBC / HPF 0-5 0 - 5 RBC/hpf   WBC, UA 0-5 0 - 5 WBC/hpf    Comment:        Reflex urine culture not performed if WBC <=10, OR if Squamous epithelial cells >5. If Squamous epithelial cells >5 suggest recollection.    Bacteria, UA NONE SEEN NONE SEEN   Squamous Epithelial / HPF 0-5 0 - 5 /HPF    Comment: Performed at [facility], [address] [place], [place] [address]  CBG monitoring, ED     Status: Abnormal   Collection Time: 10/08/23  5:02 AM  Result Value Ref Range   Glucose-Capillary 100 (H) 70 - 99 mg/dL    Comment: Glucose reference range applies only to samples taken after fasting for at least 8 hours.  Basic metabolic panel     Status: Abnormal   Collection Time: 10/08/23  6:38 AM  Result Value Ref Range   Sodium 140 135 - 145 mmol/L   Potassium 3.7 3.5 - 5.1 mmol/L   Chloride 106 98 - 111 mmol/L   CO2 26 22 - 32 mmol/L   Glucose, Bld 93 70 -  99 mg/dL    Comment: Glucose reference range applies only to samples taken after fasting for at least 8 hours.   BUN 25 (H) 8 - 23 mg/dL   Creatinine, Ser 9.07 0.44 - 1.00 mg/dL   Calcium  8.8 (L) 8.9 - 10.3 mg/dL   GFR, Estimated >39 >39 mL/min    Comment: (NOTE) Calculated using the CKD-EPI Creatinine Equation (2021)    Anion gap 8 5 - 15    Comment: Performed at [facility], [address] [place], [place] [address]  CBC     Status: Abnormal   Collection Time: 10/08/23  6:38 AM  Result Value Ref Range   WBC 6.3 4.0 - 10.5 K/uL   RBC 4.30 3.87 - 5.11 MIL/uL   Hemoglobin 10.8 (L) 12.0 - 15.0 g/dL   HCT 66.8 (L) 63.9 -  46.0 %   MCV 77.0 (L) 80.0 - 100.0 fL   MCH 25.1 (L) 26.0 - 34.0 pg   MCHC 32.6 30.0 - 36.0 g/dL   RDW 82.7 (H) 88.4 - 84.4 %   Platelets 169 150 - 400 K/uL   nRBC 0.0 0.0 - 0.2 %    Comment: Performed at [facility], [address] [place], [place] [address]  CBG monitoring, ED     Status: None   Collection Time: 10/08/23  7:49 AM  Result Value Ref Range   Glucose-Capillary 90 70 - 99 mg/dL    Comment: Glucose reference range applies only to samples taken after fasting for at least 8 hours.  ECHOCARDIOGRAM COMPLETE     Status: None   Collection Time: 10/08/23  8:22 AM  Result Value Ref Range   Weight 2,400 oz   Height 64 in   BP 165/148 mmHg   Ao pk vel 1.23 m/s   AV Area VTI 2.49 cm2   AR max vel 2.44 cm2   AV Mean grad 3.0 mmHg   AV Peak grad 6.1 mmHg   Single Plane A2C EF 70.0 %   Single Plane A4C EF 51.2 %   Calc EF 60.3 %   S' Lateral 3.60 cm   AV Area mean vel 2.33 cm2   Area-P 1/2 2.48 cm2   MV VTI 1.72 cm2   Est EF 50 - 55%   POCT CBG (Fasting - Glucose)     Status: Abnormal   Collection Time: 10/25/23 10:46 AM  Result Value Ref Range   Glucose Fasting, POC 140 (A) 70 - 99 mg/dL      Assessment & Plan:  Patient advised to go home and take her blood pressure medications immediately.  Monitor blood pressure at home and bring her medications with her at her next visit.  Lab work today. Problem List Items Addressed This Visit     Hypertension associated with diabetes (HCC)   Relevant Orders   CMP14+EGFR   DM (diabetes mellitus), type 2 (HCC) - Primary   Relevant Orders   POCT CBG (Fasting - Glucose) (Completed)   Hemoglobin A1c   Other Visit Diagnoses       Combined hyperlipidemia associated with type 2 diabetes mellitus (HCC)       Relevant Orders   CMP14+EGFR   Lipid Panel w/o Chol/HDL Ratio     Vitamin D  deficiency         Other specified hypothyroidism       Relevant Orders   TSH+T4F+T3Free       Return in about 3 weeks (around  11/15/2023).  Total time spent: 30 minutes  FERNAND FREDY RAMAN, MD  10/25/2023   This document may have been prepared by [facility] Voice Recognition software and as such may include unintentional dictation errors.

## 2023-10-26 LAB — LIPID PANEL W/O CHOL/HDL RATIO
Cholesterol, Total: 145 mg/dL (ref 100–199)
HDL: 54 mg/dL (ref >39)
LDL Chol Calc (NIH): 77 mg/dL (ref 0–99)
Triglycerides: 70 mg/dL (ref 0–149)
VLDL Cholesterol Cal: 14 mg/dL (ref 5–40)

## 2023-10-26 LAB — CMP14+EGFR
ALT: 20 IU/L (ref 0–32)
AST: 26 IU/L (ref 0–40)
Albumin: 4.2 g/dL (ref 3.7–4.7)
Alkaline Phosphatase: 87 IU/L (ref 44–121)
BUN/Creatinine Ratio: 25 (ref 12–28)
BUN: 26 mg/dL (ref 8–27)
Bilirubin Total: 0.2 mg/dL (ref 0.0–1.2)
CO2: 22 mmol/L (ref 20–29)
Calcium: 9 mg/dL (ref 8.7–10.3)
Chloride: 107 mmol/L — ABNORMAL HIGH (ref 96–106)
Creatinine, Ser: 1.04 mg/dL — ABNORMAL HIGH (ref 0.57–1.00)
Globulin, Total: 2.5 g/dL (ref 1.5–4.5)
Glucose: 90 mg/dL (ref 70–99)
Potassium: 3.9 mmol/L (ref 3.5–5.2)
Sodium: 145 mmol/L — ABNORMAL HIGH (ref 134–144)
Total Protein: 6.7 g/dL (ref 6.0–8.5)
eGFR: 54 mL/min/1.73 — ABNORMAL LOW (ref >59)

## 2023-10-26 LAB — TSH+T4F+T3FREE
Free T4: 1.33 ng/dL (ref 0.82–1.77)
T3, Free: 2.6 pg/mL (ref 2.0–4.4)
TSH: 0.835 u[IU]/mL (ref 0.450–4.500)

## 2023-10-26 LAB — HEMOGLOBIN A1C
Est. average glucose Bld gHb Est-mCnc: 137 mg/dL
Hgb A1c MFr Bld: 6.4 % — ABNORMAL HIGH (ref 4.8–5.6)

## 2023-10-27 NOTE — Progress Notes (Signed)
Pt informed

## 2023-11-02 ENCOUNTER — Other Ambulatory Visit: Payer: Self-pay | Admitting: Internal Medicine

## 2023-11-02 DIAGNOSIS — E890 Postprocedural hypothyroidism: Secondary | ICD-10-CM

## 2023-11-04 ENCOUNTER — Other Ambulatory Visit: Payer: Self-pay | Admitting: Internal Medicine

## 2023-11-08 ENCOUNTER — Other Ambulatory Visit: Payer: Self-pay

## 2023-11-08 MED ORDER — CARVEDILOL 6.25 MG PO TABS: 6.2500 mg | ORAL_TABLET | Freq: Two times a day (BID) | ORAL | 0 refills | Status: DC

## 2023-11-16 ENCOUNTER — Ambulatory Visit: Admitting: Internal Medicine

## 2024-01-06 DIAGNOSIS — M25512 Pain in left shoulder: Secondary | ICD-10-CM | POA: Diagnosis not present

## 2024-01-06 DIAGNOSIS — M5441 Lumbago with sciatica, right side: Secondary | ICD-10-CM | POA: Diagnosis not present

## 2024-01-06 DIAGNOSIS — M25511 Pain in right shoulder: Secondary | ICD-10-CM | POA: Diagnosis not present

## 2024-01-06 DIAGNOSIS — M5416 Radiculopathy, lumbar region: Secondary | ICD-10-CM | POA: Diagnosis not present

## 2024-01-06 DIAGNOSIS — G8929 Other chronic pain: Secondary | ICD-10-CM | POA: Diagnosis not present

## 2024-01-18 ENCOUNTER — Other Ambulatory Visit: Payer: Self-pay | Admitting: Family

## 2024-01-21 DIAGNOSIS — M25311 Other instability, right shoulder: Secondary | ICD-10-CM | POA: Diagnosis not present

## 2024-01-21 DIAGNOSIS — M25512 Pain in left shoulder: Secondary | ICD-10-CM | POA: Diagnosis not present

## 2024-01-21 DIAGNOSIS — M25511 Pain in right shoulder: Secondary | ICD-10-CM | POA: Diagnosis not present

## 2024-01-21 DIAGNOSIS — G8929 Other chronic pain: Secondary | ICD-10-CM | POA: Diagnosis not present

## 2024-01-21 DIAGNOSIS — S46001A Unspecified injury of muscle(s) and tendon(s) of the rotator cuff of right shoulder, initial encounter: Secondary | ICD-10-CM | POA: Diagnosis not present

## 2024-01-21 DIAGNOSIS — M12812 Other specific arthropathies, not elsewhere classified, left shoulder: Secondary | ICD-10-CM | POA: Diagnosis not present

## 2024-01-24 ENCOUNTER — Other Ambulatory Visit: Payer: Self-pay | Admitting: Internal Medicine

## 2024-01-25 ENCOUNTER — Other Ambulatory Visit: Payer: Self-pay | Admitting: Cardiology

## 2024-01-25 ENCOUNTER — Other Ambulatory Visit: Payer: Self-pay | Admitting: Orthopedic Surgery

## 2024-01-25 DIAGNOSIS — M25511 Pain in right shoulder: Secondary | ICD-10-CM

## 2024-01-25 DIAGNOSIS — M25311 Other instability, right shoulder: Secondary | ICD-10-CM

## 2024-01-25 DIAGNOSIS — S46001A Unspecified injury of muscle(s) and tendon(s) of the rotator cuff of right shoulder, initial encounter: Secondary | ICD-10-CM

## 2024-01-25 DIAGNOSIS — E890 Postprocedural hypothyroidism: Secondary | ICD-10-CM

## 2024-02-01 ENCOUNTER — Encounter: Payer: Self-pay | Admitting: Internal Medicine

## 2024-02-05 ENCOUNTER — Ambulatory Visit
Admission: RE | Admit: 2024-02-05 | Discharge: 2024-02-05 | Disposition: A | Discharge status: Home / Self Care | Source: Ambulatory Visit | Attending: Orthopedic Surgery | Admitting: Orthopedic Surgery

## 2024-02-05 DIAGNOSIS — S46001A Unspecified injury of muscle(s) and tendon(s) of the rotator cuff of right shoulder, initial encounter: Secondary | ICD-10-CM

## 2024-02-05 DIAGNOSIS — M25511 Pain in right shoulder: Secondary | ICD-10-CM

## 2024-02-05 DIAGNOSIS — M25311 Other instability, right shoulder: Secondary | ICD-10-CM

## 2024-02-08 ENCOUNTER — Encounter: Payer: Self-pay | Admitting: Internal Medicine

## 2024-02-08 ENCOUNTER — Other Ambulatory Visit: Payer: Self-pay | Admitting: Internal Medicine

## 2024-02-08 ENCOUNTER — Ambulatory Visit (INDEPENDENT_AMBULATORY_CARE_PROVIDER_SITE_OTHER): Admitting: Internal Medicine

## 2024-02-08 VITALS — BP 148/78 | HR 69 | Ht 64.0 in | Wt 156.2 lb

## 2024-02-08 DIAGNOSIS — D508 Other iron deficiency anemias: Secondary | ICD-10-CM | POA: Diagnosis not present

## 2024-02-08 DIAGNOSIS — E119 Type 2 diabetes mellitus without complications: Secondary | ICD-10-CM

## 2024-02-08 DIAGNOSIS — E038 Other specified hypothyroidism: Secondary | ICD-10-CM

## 2024-02-08 DIAGNOSIS — E1169 Type 2 diabetes mellitus with other specified complication: Secondary | ICD-10-CM | POA: Diagnosis not present

## 2024-02-08 DIAGNOSIS — I152 Hypertension secondary to endocrine disorders: Secondary | ICD-10-CM | POA: Diagnosis not present

## 2024-02-08 DIAGNOSIS — E782 Mixed hyperlipidemia: Secondary | ICD-10-CM

## 2024-02-08 LAB — POCT CBG (FASTING - GLUCOSE)-MANUAL ENTRY: Glucose Fasting, POC: 110 mg/dL — AB (ref 70–99)

## 2024-02-08 MED ORDER — ONETOUCH VERIO VI STRP: ORAL_STRIP | 6 refills | Status: AC

## 2024-02-08 NOTE — Progress Notes (Signed)
 Established Patient Office Visit  Subjective:  Patient ID: Helen Mccormick, female    DOB: January 15, 1941  Age: 83 y.o. MRN: [id number]  Chief Complaint  Patient presents with   Follow-up    3 month follow up    Patient comes in for follow up today accompanied by her grand daughter Marjorie. Seems to be in good spirits and says she is feeling very well . Currently under care of orthopedics for right shoulder pain. MRI shows right full thickness rotator cuff tear- will be discussing treatment options with them. Not taking Gabapentin  or Buspar, denies confusional episodes or staggering gait. Grand daughter also notes that patient is seemingly more alert these days. Patient had a cognitive evaluation by Neurologist in 08/2023 and showed NO significant cognitive decline. Advised to improve her hearing , and get more socially involved. Patient is up to date on Mammo, BMD and colon. Will get fasting labs today.    No other concerns at this time.   Past Medical History:  Diagnosis Date   Allergy    Anxiety    CHF (congestive heart failure) (HCC)    Colon polyp    Depression    GERD (gastroesophageal reflux disease)    Heart murmur    Hyperlipidemia    Hypertension    Hypothyroidism    Sleep apnea    Thyroid  disease     Past Surgical History:  Procedure Laterality Date   ABDOMINAL HYSTERECTOMY     BACK SURGERY     BACK SURGERY     CERVICAL FUSION     COLONOSCOPY WITH PROPOFOL  N/A 05/28/2017   Procedure: COLONOSCOPY WITH PROPOFOL ;  Surgeon: Therisa Bi, MD;  Location: [facility] ENDOSCOPY;  Service: Gastroenterology;  Laterality: N/A;   COLONOSCOPY WITH PROPOFOL  N/A 11/17/2019   Procedure: COLONOSCOPY WITH PROPOFOL ;  Surgeon: Maryruth Ole DASEN, MD;  Location: [facility] ENDOSCOPY;  Service: Endoscopy;  Laterality: N/A;   COLONOSCOPY WITH PROPOFOL  N/A 04/20/2023   Procedure: COLONOSCOPY WITH PROPOFOL ;  Surgeon: Therisa Bi, MD;  Location: [facility] ENDOSCOPY;  Service: Gastroenterology;   Laterality: N/A;   ESOPHAGOGASTRODUODENOSCOPY (EGD) WITH PROPOFOL  N/A 11/17/2019   Procedure: ESOPHAGOGASTRODUODENOSCOPY (EGD) WITH PROPOFOL ;  Surgeon: Maryruth Ole DASEN, MD;  Location: [facility] ENDOSCOPY;  Service: Endoscopy;  Laterality: N/A;   ESOPHAGOGASTRODUODENOSCOPY (EGD) WITH PROPOFOL  N/A 04/20/2023   Procedure: ESOPHAGOGASTRODUODENOSCOPY (EGD) WITH PROPOFOL ;  Surgeon: Therisa Bi, MD;  Location: [facility] ENDOSCOPY;  Service: Gastroenterology;  Laterality: N/A;   IR KYPHO THORACIC WITH BONE BIOPSY  10/20/2022   IR RADIOLOGIST EVAL & MGMT  09/22/2022   NECK SURGERY     POLYPECTOMY  04/20/2023   Procedure: POLYPECTOMY;  Surgeon: Therisa Bi, MD;  Location: [facility] ENDOSCOPY;  Service: Gastroenterology;;   THYROID  SURGERY     THYROID  SURGERY      Social History   Socioeconomic History   Marital status: Single    Spouse name: Not on file   Number of children: Not on file   Years of education: Not on file   Highest education level: 10th grade  Occupational History   Occupation: retired  Tobacco Use   Smoking status: Never   Smokeless tobacco: Former    Types: Associate Professor status: Never Used  Substance and Sexual Activity   Alcohol use: No   Drug use: No   Sexual activity: Not Currently  Other Topics Concern   Not on file  Social History Narrative   Not on file   Social Drivers of Health  Financial Resource Strain: Low Risk  (09/15/2023)   Received from [facility]   Overall Financial Resource Strain (CARDIA)    Difficulty of Paying Living Expenses: Not hard at all  Food Insecurity: No Food Insecurity (09/15/2023)   Received from [facility]   Hunger Vital Sign    Within the past 12 months, you worried that your food would run out before you got the money to buy more.: Never true    Within the past 12 months, the food you bought just didn't last and you didn't have money to get more.: Never true  Transportation Needs: No  Transportation Needs (09/15/2023)   Received from [facility] - Transportation    In the past 12 months, has lack of transportation kept you from medical appointments or from getting medications?: No    Lack of Transportation (Non-Medical): No  Physical Activity: Sufficiently Active (12/20/2018)   Exercise Vital Sign    Days of Exercise per Week: 4 days    Minutes of Exercise per Session: 60 min  Stress: No Stress Concern Present (03/12/2017)   [employer] of Occupational [employer] - Occupational Stress Questionnaire    Feeling of Stress : Only a little  Social Connections: Moderately Integrated (04/27/2017)   Social Connection and Isolation Panel    Frequency of Communication with Friends and Family: More than three times a week    Frequency of Social Gatherings with Friends and Family: Once a week    Attends Religious Services: More than 4 times per year    Active Member of [employer] or Organizations: Yes    Attends Banker Meetings: More than 4 times per year    Marital Status: Never married  Intimate Partner Violence: Unknown (03/12/2017)   Humiliation, Afraid, Rape, and Kick questionnaire    Fear of Current or Ex-Partner: Patient declined    Emotionally Abused: Patient declined    Physically Abused: Patient declined    Sexually Abused: Patient declined    Family History  Problem Relation Age of Onset   Breast cancer Maternal Aunt 17   Diabetes Mother    Leukemia Mother    Heart Problems Mother    Diabetes Sister    Mental illness Sister    Colon polyps Sister    Stroke Father    Cancer Brother        rectal   Healthy Daughter    Hypertension Son    Diabetes Sister    Thyroid  disease Sister    Healthy Sister    Healthy Sister    Stroke Brother    Healthy Son    Healthy Son    Healthy Daughter     Allergies  Allergen Reactions   Amlodipine  Hives and Swelling   Tramadol  Other (See Comments)    Dizziness    Isosorbide   Dinitrate     Other Reaction(s): Headache   Codeine Nausea Only   Etodolac Other (See Comments)    Other reaction(s): Abdominal Pain    Outpatient Medications Prior to Visit  Medication Sig   Accu-Chek Softclix Lancets lancets Use as instructed   albuterol  (VENTOLIN  HFA) 108 (90 Base) MCG/ACT inhaler Inhale 2 puffs by mouth every 6 hours as needed for wheezing/shortness of breath   ARNUITY ELLIPTA  100 MCG/ACT AEPB Inhale 1 puff by mouth every day   Azelastine HCl 137 MCG/SPRAY SOLN Use 1 spray into each nostril every day as directed   carvedilol  (COREG ) 6.25 MG  tablet Take 1 tablet by mouth twice daily   ENTRESTO  97-103 MG Take 1 tablet by mouth twice daily   escitalopram  (LEXAPRO ) 10 MG tablet Take 1 tablet by mouth every day   isosorbide  dinitrate (ISORDIL ) 30 MG tablet Take 1 tablet by mouth every day   JARDIANCE  25 MG TABS tablet Take 1 tablet by mouth every day   levothyroxine  (SYNTHROID ) 75 MCG tablet Take 1 tablet by mouth every day   pantoprazole  (PROTONIX ) 40 MG tablet Take 1 tablet by mouth every day   RESTASIS  0.05 % ophthalmic emulsion    rosuvastatin  (CRESTOR ) 40 MG tablet Take 1 tablet by mouth every day   [DISCONTINUED] ONETOUCH VERIO test strip USE TO CHECK BLOOD SUGAR TWICE DAILY   furosemide  (LASIX ) 20 MG tablet Take 1 tablet by mouth on Mon, Wed, and Fri (Patient not taking: Reported on 02/08/2024)   [DISCONTINUED] alendronate (FOSAMAX) 70 MG tablet Take 70 mg by mouth.  Take 1 tablet (70 mg total) by mouth every 7 (seven) days Take on an empty stomach with a full glass of water. Avoid mineral or well water. Do not eat or take other medications for at least 30 minutes after dose. Sit or stand for at least 30 minutes after dose. (Patient not taking: Reported on 02/08/2024)   [DISCONTINUED] amLODipine  (NORVASC ) 5 MG tablet Take 5 mg by mouth daily. (Patient not taking: Reported on 02/08/2024)   [DISCONTINUED] busPIRone (BUSPAR) 15 MG tablet Take 15 mg by mouth 2 (two) times  daily. (Patient not taking: Reported on 02/08/2024)   [DISCONTINUED] Cholecalciferol  (D3) 25 MCG (1000 UT) capsule Take 1,000 Units by mouth daily. (Patient not taking: Reported on 02/08/2024)   [DISCONTINUED] gabapentin  (NEURONTIN ) 100 MG capsule Take 1 capsule by mouth 3 times a day (Patient not taking: Reported on 02/08/2024)   [DISCONTINUED] Multiple Vitamin (MULTIVITAMIN) tablet Take 1 tablet by mouth daily. (Patient not taking: Reported on 02/08/2024)   [DISCONTINUED] oxycodone  (OXY-IR) 5 MG capsule Take 5 mg by mouth every 4 (four) hours as needed. (Patient not taking: Reported on 02/08/2024)   [DISCONTINUED] polyethylene glycol powder (GLYCOLAX /MIRALAX ) 17 GM/SCOOP powder Take 17 g by mouth.  Take 17 g by mouth once daily (Patient not taking: Reported on 02/08/2024)   No facility-administered medications prior to visit.    Review of Systems  Constitutional: Negative.  Negative for chills, fever and malaise/fatigue.  HENT: Negative.  Negative for congestion and sore throat.   Eyes: Negative.  Negative for blurred vision and pain.  Respiratory: Negative.  Negative for cough and shortness of breath.   Cardiovascular: Negative.  Negative for chest pain, palpitations and leg swelling.  Gastrointestinal: Negative.  Negative for abdominal pain, blood in stool, constipation, diarrhea, heartburn, melena, nausea and vomiting.  Genitourinary: Negative.  Negative for dysuria, flank pain, frequency and urgency.  Musculoskeletal: Negative.  Negative for joint pain and myalgias.  Skin: Negative.   Neurological: Negative.  Negative for dizziness, tingling, sensory change, weakness and headaches.  Endo/Heme/Allergies: Negative.   Psychiatric/Behavioral: Negative.  Negative for depression and suicidal ideas. The patient is not nervous/anxious.        Objective:   BP (!) 148/78   Pulse 69   Ht 5' 4 (1.626 m)   Wt 156 lb 3.2 oz (70.9 kg)   LMP  (LMP Unknown)   SpO2 98%   BMI 26.81 kg/m    Vitals:   02/08/24 1119  BP: (!) 148/78  Pulse: 69  Height: 5' 4 (1.626 m)  Weight:  156 lb 3.2 oz (70.9 kg)  SpO2: 98%  BMI (Calculated): 26.8    Physical Exam Vitals and nursing note reviewed.  Constitutional:      Appearance: Normal appearance.  HENT:     Head: Normocephalic and atraumatic.     Nose: Nose normal.     Mouth/Throat:     Mouth: Mucous membranes are moist.     Pharynx: Oropharynx is clear.  Eyes:     Conjunctiva/sclera: Conjunctivae normal.     Pupils: Pupils are equal, round, and reactive to light.  Cardiovascular:     Rate and Rhythm: Normal rate and regular rhythm.     Pulses: Normal pulses.     Heart sounds: Normal heart sounds. No murmur heard. Pulmonary:     Effort: Pulmonary effort is normal.     Breath sounds: Normal breath sounds. No wheezing.  Abdominal:     General: Bowel sounds are normal.     Palpations: Abdomen is soft.     Tenderness: There is no abdominal tenderness. There is no right CVA tenderness or left CVA tenderness.  Musculoskeletal:        General: Normal range of motion.     Cervical back: Normal range of motion.     Right lower leg: No edema.     Left lower leg: No edema.  Skin:    General: Skin is warm and dry.  Neurological:     General: No focal deficit present.     Mental Status: She is alert and oriented to person, place, and time.  Psychiatric:        Mood and Affect: Mood normal.        Behavior: Behavior normal.      Results for orders placed or performed in visit on 02/08/24  POCT CBG (Fasting - Glucose)  Result Value Ref Range   Glucose Fasting, POC 110 (A) 70 - 99 mg/dL    Recent Results (from the past 2160 hours)  POCT CBG (Fasting - Glucose)     Status: Abnormal   Collection Time: 02/08/24 11:29 AM  Result Value Ref Range   Glucose Fasting, POC 110 (A) 70 - 99 mg/dL      Assessment & Plan:  Continue current meds. Check labs today. Problem List Items Addressed This Visit     Hypertension  associated with diabetes (HCC) - Primary   Relevant Orders   CMP14+EGFR   DM (diabetes mellitus), type 2 (HCC)   Relevant Medications   glucose blood (ONETOUCH VERIO) test strip   Other Relevant Orders   POCT CBG (Fasting - Glucose) (Completed)   Hemoglobin A1c   Other Visit Diagnoses       Combined hyperlipidemia associated with type 2 diabetes mellitus (HCC)       Relevant Orders   Lipid Panel w/o Chol/HDL Ratio   CMP14+EGFR     Other specified hypothyroidism       Relevant Orders   TSH+T4F+T3Free     Other iron deficiency anemia       Relevant Orders   CBC with Diff       Return in about 3 months (around 05/10/2024).   Total time spent: 30 minutes. This time includes review of previous notes and results and patient face to face interaction during today's visit.    FERNAND FREDY RAMAN, MD  02/08/2024   This document may have been prepared by [facility] Voice Recognition software and as such may include unintentional dictation errors.

## 2024-02-09 LAB — CBC WITH DIFFERENTIAL/PLATELET
Basophils Absolute: 0 x10E3/uL (ref 0.0–0.2)
Basos: 0 %
EOS (ABSOLUTE): 0.1 x10E3/uL (ref 0.0–0.4)
Eos: 2 %
Hematocrit: 40.8 % (ref 34.0–46.6)
Hemoglobin: 12.5 g/dL (ref 11.1–15.9)
Immature Grans (Abs): 0 x10E3/uL (ref 0.0–0.1)
Immature Granulocytes: 0 %
Lymphocytes Absolute: 2.9 x10E3/uL (ref 0.7–3.1)
Lymphs: 48 %
MCH: 24.7 pg — ABNORMAL LOW (ref 26.6–33.0)
MCHC: 30.6 g/dL — ABNORMAL LOW (ref 31.5–35.7)
MCV: 81 fL (ref 79–97)
Monocytes Absolute: 0.3 x10E3/uL (ref 0.1–0.9)
Monocytes: 6 %
Neutrophils Absolute: 2.6 x10E3/uL (ref 1.4–7.0)
Neutrophils: 44 %
Platelets: 218 x10E3/uL (ref 150–450)
RBC: 5.07 x10E6/uL (ref 3.77–5.28)
RDW: 14.8 % (ref 11.7–15.4)
WBC: 6 x10E3/uL (ref 3.4–10.8)

## 2024-02-09 LAB — CMP14+EGFR
ALT: 24 IU/L (ref 0–32)
AST: 29 IU/L (ref 0–40)
Albumin: 4.5 g/dL (ref 3.7–4.7)
Alkaline Phosphatase: 118 IU/L (ref 48–129)
BUN/Creatinine Ratio: 24 (ref 12–28)
BUN: 21 mg/dL (ref 8–27)
Bilirubin Total: 0.3 mg/dL (ref 0.0–1.2)
CO2: 24 mmol/L (ref 20–29)
Calcium: 9.3 mg/dL (ref 8.7–10.3)
Chloride: 105 mmol/L (ref 96–106)
Creatinine, Ser: 0.88 mg/dL (ref 0.57–1.00)
Globulin, Total: 2.9 g/dL (ref 1.5–4.5)
Glucose: 121 mg/dL — ABNORMAL HIGH (ref 70–99)
Potassium: 3.6 mmol/L (ref 3.5–5.2)
Sodium: 143 mmol/L (ref 134–144)
Total Protein: 7.4 g/dL (ref 6.0–8.5)
eGFR: 66 mL/min/1.73 (ref >59)

## 2024-02-09 LAB — TSH+T4F+T3FREE
Free T4: 1.28 ng/dL (ref 0.82–1.77)
T3, Free: 2.8 pg/mL (ref 2.0–4.4)
TSH: 1.61 u[IU]/mL (ref 0.450–4.500)

## 2024-02-09 LAB — LIPID PANEL W/O CHOL/HDL RATIO
Cholesterol, Total: 190 mg/dL (ref 100–199)
HDL: 50 mg/dL (ref >39)
LDL Chol Calc (NIH): 122 mg/dL — ABNORMAL HIGH (ref 0–99)
Triglycerides: 100 mg/dL (ref 0–149)
VLDL Cholesterol Cal: 18 mg/dL (ref 5–40)

## 2024-02-09 LAB — HEMOGLOBIN A1C
Est. average glucose Bld gHb Est-mCnc: 134 mg/dL
Hgb A1c MFr Bld: 6.3 % — ABNORMAL HIGH (ref 4.8–5.6)

## 2024-02-10 ENCOUNTER — Ambulatory Visit: Payer: Self-pay | Admitting: Internal Medicine

## 2024-02-16 NOTE — Progress Notes (Signed)
 Left vm to return call.

## 2024-02-22 ENCOUNTER — Other Ambulatory Visit: Payer: Self-pay | Admitting: Orthopedic Surgery

## 2024-02-22 DIAGNOSIS — M75121 Complete rotator cuff tear or rupture of right shoulder, not specified as traumatic: Secondary | ICD-10-CM

## 2024-02-25 ENCOUNTER — Other Ambulatory Visit: Payer: Self-pay | Admitting: Cardiovascular Disease

## 2024-02-25 DIAGNOSIS — I428 Other cardiomyopathies: Secondary | ICD-10-CM

## 2024-02-25 DIAGNOSIS — I10 Essential (primary) hypertension: Secondary | ICD-10-CM

## 2024-02-25 DIAGNOSIS — R42 Dizziness and giddiness: Secondary | ICD-10-CM

## 2024-02-25 DIAGNOSIS — E1169 Type 2 diabetes mellitus with other specified complication: Secondary | ICD-10-CM

## 2024-02-29 ENCOUNTER — Ambulatory Visit
Admission: RE | Admit: 2024-02-29 | Discharge: 2024-02-29 | Disposition: A | Source: Ambulatory Visit | Attending: Orthopedic Surgery

## 2024-02-29 DIAGNOSIS — M75121 Complete rotator cuff tear or rupture of right shoulder, not specified as traumatic: Secondary | ICD-10-CM | POA: Insufficient documentation

## 2024-03-17 ENCOUNTER — Other Ambulatory Visit: Payer: Self-pay | Admitting: Internal Medicine

## 2024-03-21 ENCOUNTER — Other Ambulatory Visit: Payer: Self-pay | Admitting: Orthopedic Surgery

## 2024-04-03 ENCOUNTER — Other Ambulatory Visit: Payer: Self-pay

## 2024-04-03 ENCOUNTER — Other Ambulatory Visit: Payer: Self-pay | Admitting: Internal Medicine

## 2024-04-03 DIAGNOSIS — I10 Essential (primary) hypertension: Secondary | ICD-10-CM

## 2024-04-03 DIAGNOSIS — E1169 Type 2 diabetes mellitus with other specified complication: Secondary | ICD-10-CM

## 2024-04-03 DIAGNOSIS — R42 Dizziness and giddiness: Secondary | ICD-10-CM

## 2024-04-03 DIAGNOSIS — I428 Other cardiomyopathies: Secondary | ICD-10-CM

## 2024-04-03 MED ORDER — ISOSORBIDE DINITRATE 30 MG PO TABS: 30.0000 mg | ORAL_TABLET | Freq: Every day | ORAL | 11 refills | Status: DC

## 2024-04-04 ENCOUNTER — Inpatient Hospital Stay: Admission: RE | Admit: 2024-04-04 | Source: Ambulatory Visit

## 2024-04-05 ENCOUNTER — Encounter
Admission: RE | Admit: 2024-04-05 | Discharge: 2024-04-05 | Disposition: A | Discharge status: Home / Self Care | Source: Ambulatory Visit | Attending: Orthopedic Surgery | Admitting: Orthopedic Surgery

## 2024-04-05 ENCOUNTER — Other Ambulatory Visit: Payer: Self-pay

## 2024-04-05 VITALS — BP 171/87 | HR 61 | Temp 97.8°F | Resp 18 | Ht 64.0 in | Wt 152.6 lb

## 2024-04-05 DIAGNOSIS — Z01810 Encounter for preprocedural cardiovascular examination: Secondary | ICD-10-CM | POA: Diagnosis not present

## 2024-04-05 DIAGNOSIS — Z01818 Encounter for other preprocedural examination: Secondary | ICD-10-CM | POA: Diagnosis present

## 2024-04-05 DIAGNOSIS — Z01812 Encounter for preprocedural laboratory examination: Secondary | ICD-10-CM

## 2024-04-05 HISTORY — DX: Atherosclerotic heart disease of native coronary artery without angina pectoris: I25.10

## 2024-04-05 LAB — CBC WITH DIFFERENTIAL/PLATELET
Abs Immature Granulocytes: 0.01 K/uL (ref 0.00–0.07)
Basophils Absolute: 0 K/uL (ref 0.0–0.1)
Basophils Relative: 0 %
Eosinophils Absolute: 0.1 K/uL (ref 0.0–0.5)
Eosinophils Relative: 3 %
HCT: 36.7 % (ref 36.0–46.0)
Hemoglobin: 11.6 g/dL — ABNORMAL LOW (ref 12.0–15.0)
Immature Granulocytes: 0 %
Lymphocytes Relative: 53 %
Lymphs Abs: 2.5 K/uL (ref 0.7–4.0)
MCH: 24.3 pg — ABNORMAL LOW (ref 26.0–34.0)
MCHC: 31.6 g/dL (ref 30.0–36.0)
MCV: 76.8 fL — ABNORMAL LOW (ref 80.0–100.0)
Monocytes Absolute: 0.3 K/uL (ref 0.1–1.0)
Monocytes Relative: 7 %
Neutro Abs: 1.8 K/uL (ref 1.7–7.7)
Neutrophils Relative %: 37 %
Platelets: 193 K/uL (ref 150–400)
RBC: 4.78 MIL/uL (ref 3.87–5.11)
RDW: 15.6 % — ABNORMAL HIGH (ref 11.5–15.5)
WBC: 4.8 K/uL (ref 4.0–10.5)
nRBC: 0 % (ref 0.0–0.2)

## 2024-04-05 LAB — COMPREHENSIVE METABOLIC PANEL WITH GFR
ALT: 11 U/L (ref 0–44)
AST: 22 U/L (ref 15–41)
Albumin: 4.3 g/dL (ref 3.5–5.0)
Alkaline Phosphatase: 83 U/L (ref 38–126)
Anion gap: 11 (ref 5–15)
BUN: 17 mg/dL (ref 8–23)
CO2: 27 mmol/L (ref 22–32)
Calcium: 9.4 mg/dL (ref 8.9–10.3)
Chloride: 105 mmol/L (ref 98–111)
Creatinine, Ser: 0.76 mg/dL (ref 0.44–1.00)
GFR, Estimated: >60 mL/min
Glucose, Bld: 77 mg/dL (ref 70–99)
Potassium: 3.4 mmol/L — ABNORMAL LOW (ref 3.5–5.1)
Sodium: 143 mmol/L (ref 135–145)
Total Bilirubin: 0.5 mg/dL (ref 0.0–1.2)
Total Protein: 7.1 g/dL (ref 6.5–8.1)

## 2024-04-05 LAB — SURGICAL PCR SCREEN
MRSA, PCR: NEGATIVE
Staphylococcus aureus: NEGATIVE

## 2024-04-05 LAB — URINALYSIS, ROUTINE W REFLEX MICROSCOPIC
Bacteria, UA: NONE SEEN
Bilirubin Urine: NEGATIVE
Glucose, UA: >=500 mg/dL — AB
Hgb urine dipstick: NEGATIVE
Ketones, ur: NEGATIVE mg/dL
Nitrite: NEGATIVE
Protein, ur: NEGATIVE mg/dL
Specific Gravity, Urine: 1.02 (ref 1.005–1.030)
pH: 7 (ref 5.0–8.0)

## 2024-04-05 NOTE — Patient Instructions (Addendum)
 Your procedure is scheduled on:  TUESDAY JANUARY 13  Report to the Registration Desk on the 1st floor of the [facility] To find out your arrival time, please call [phone] between 1PM - 3PM on:  MONDAY JANUARY 12 If your arrival time is 6:00 am, do not arrive before that time as the [facility] entrance doors do not open until 6:00 am.  REMEMBER: Instructions that are not followed completely may result in serious medical risk, up to and including death; or upon the discretion of your surgeon and anesthesiologist your surgery may need to be rescheduled.  Do not eat food after midnight the night before surgery.  No gum chewing or hard candies.  You may however, drink WATER  up to 2 hours before you are scheduled to arrive for your surgery. Do not drink anything within 2 hours of your scheduled arrival time.   In addition, your doctor has ordered for you to drink the provided:   Gatorade G2 Drinking this carbohydrate drink up to two hours before surgery helps to reduce insulin  resistance and improve patient outcomes. Please complete drinking 2 hours before scheduled arrival time.  One week prior to surgery: STARTING WEDNESDAY JANUARY 7  Stop Anti-inflammatories (NSAIDS) such as Advil , Aleve , Ibuprofen , Motrin , Naproxen , Naprosyn  and Aspirin  based products such as Excedrin, Goody's Powder, BC Powder. Stop ANY OVER THE COUNTER supplements until after surgery.  You may however, continue to take Tylenol  if needed for pain up until the day of surgery.  **Follow guidelines for insulin  and diabetes medications.** JARDIANCE  hold 3 days prior to surgery, last dose FRIDAY JANUARY 9  **Follow recommendations regarding stopping blood thinners.**  Continue taking all of your other prescription medications up until the day of surgery.  ON THE DAY OF SURGERY ONLY TAKE THESE MEDICATIONS WITH SIPS OF WATER:  isosorbide  dinitrate (ISORDIL )  escitalopram  (LEXAPRO )  carvedilol    Use inhalers  on the day of surgery and bring to the hospital. albuterol  (VENTOLIN  HFA)  ARNUITY ELLIPTA    No Alcohol for 24 hours before or after surgery.  Do not use any recreational drugs for at least a week (preferably 2 weeks) before your surgery.  Please be advised that the combination of cocaine and anesthesia may have negative outcomes, up to and including death. If you test positive for cocaine, your surgery will be cancelled.  On the morning of surgery brush your teeth with toothpaste and water, you may rinse your mouth with mouthwash if you wish. Do not swallow any toothpaste or mouthwash.  Use CHG Soap as directed on instruction sheet.  Do not wear jewelry, make-up, hairpins, clips or nail polish.  For welded (permanent) jewelry: bracelets, anklets, waist bands, etc.  Please have this removed prior to surgery.  If it is not removed, there is a chance that hospital personnel will need to cut it off on the day of surgery.  Do not wear lotions, powders, or perfumes.   Do not shave body hair from the neck down 48 hours before surgery.  Contact lenses, hearing aids and dentures may not be worn into surgery.  Do not bring valuables to the hospital. [facility] is not responsible for any missing/lost belongings or valuables.   Total Shoulder Arthroplasty:  use Benzoyl Peroxide 5% Gel as directed on instruction sheet.  Notify your doctor if there is any change in your medical condition (cold, fever, infection).  Wear comfortable clothing (specific to your surgery type) to the hospital.  After surgery, you can  help prevent lung complications by doing breathing exercises.  Take deep breaths and cough every 1-2 hours. Your doctor may order a device called an Incentive Spirometer to help you take deep breaths.  If you are being discharged the day of surgery, you will not be allowed to drive home. You will need a responsible individual to drive you home and stay with you for 24 hours after  surgery.   If you are taking public transportation, you will need to have a responsible individual with you.  Please call the Pre-admissions Testing Dept. at [phone] if you have any questions about these instructions.  Surgery Visitation Policy:  Patients having surgery or a procedure may have two visitors.  Children under the age of 52 must have an adult with them who is not the patient.  Merchandiser, Retail to address health-related social needs:  [email or web address]                                                                                                              Preparing for Surgery with CHLORHEXIDINE GLUCONATE (CHG) Soap  Chlorhexidine Gluconate (CHG) Soap  o An antiseptic cleaner that kills germs and bonds with the skin to continue killing germs even after washing  o Used for showering the night before surgery and morning of surgery  Before surgery, you can play an important role by reducing the number of germs on your skin.  CHG (Chlorhexidine gluconate) soap is an antiseptic cleanser which kills germs and bonds with the skin to continue killing germs even after washing.  Please do not use if you have an allergy to CHG or antibacterial soaps. If your skin becomes reddened/irritated stop using the CHG.  1. Shower the NIGHT BEFORE SURGERY with CHG soap.  2. If you choose to wash your hair, wash your hair first as usual with your normal shampoo.  3. After shampooing, rinse your hair and body thoroughly to remove the shampoo.  4. Use CHG as you would any other liquid soap. You can apply CHG directly to the skin and wash gently with a clean washcloth.  5. Apply the CHG soap to your body only from the neck down. Do not use on open wounds or open sores. Avoid contact with your eyes, ears, mouth, and genitals (private parts). Wash face and genitals (private parts) with your normal soap.  6. Wash thoroughly, paying special attention to the  area where your surgery will be performed.  7. Thoroughly rinse your body with warm water.  8. Do not shower/wash with your normal soap after using and rinsing off the CHG soap.  9. Do not use lotions, oils, etc., after showering with CHG.  10. Pat yourself dry with a clean towel.  11. Wear clean pajamas to bed the night before surgery.  12. Place clean sheets on your bed the night of your shower and do not sleep with pets.  13. Do not apply any deodorants/lotions/powders.  14. Please wear clean clothes to the hospital.  15. Remember to brush  your teeth with your regular toothpaste.  How to Use an Incentive Spirometer  An incentive spirometer is a tool that measures how well you are filling your lungs with each breath. Learning to take long, deep breaths using this tool can help you keep your lungs clear and active. This may help to reverse or lessen your chance of developing breathing (pulmonary) problems, especially infection. You may be asked to use a spirometer: After a surgery. If you have a lung problem or a history of smoking. After a long period of time when you have been unable to move or be active. If the spirometer includes an indicator to show the highest number that you have reached, your health care provider or respiratory therapist will help you set a goal. Keep a log of your progress as told by your health care provider. What are the risks? Breathing too quickly may cause dizziness or cause you to pass out. Take your time so you do not get dizzy or light-headed. If you are in pain, you may need to take pain medicine before doing incentive spirometry. It is harder to take a deep breath if you are having pain. How to use your incentive spirometer  Sit up on the edge of your bed or on a chair. Hold the incentive spirometer so that it is in an upright position. Before you use the spirometer, breathe out normally. Place the mouthpiece in your mouth. Make sure your lips  are closed tightly around it. Breathe in slowly and as deeply as you can through your mouth, causing the piston or the ball to rise toward the top of the chamber. Hold your breath for 3-5 seconds, or for as long as possible. If the spirometer includes a coach indicator, use this to guide you in breathing. Slow down your breathing if the indicator goes above the marked areas. Remove the mouthpiece from your mouth and breathe out normally. The piston or ball will return to the bottom of the chamber. Rest for a few seconds, then repeat the steps 10 or more times. Take your time and take a few normal breaths between deep breaths so that you do not get dizzy or light-headed. Do this every 1-2 hours when you are awake. If the spirometer includes a goal marker to show the highest number you have reached (best effort), use this as a goal to work toward during each repetition. After each set of 10 deep breaths, cough a few times. This will help to make sure that your lungs are clear. If you have an incision on your chest or abdomen from surgery, place a pillow or a rolled-up towel firmly against the incision when you cough. This can help to reduce pain while taking deep breaths and coughing. General tips When you are able to get out of bed: Walk around often. Continue to take deep breaths and cough in order to clear your lungs. Keep using the incentive spirometer until your health care provider says it is okay to stop using it. If you have been in the hospital, you may be told to keep using the spirometer at home. Contact a health care provider if: You are having difficulty using the spirometer. You have trouble using the spirometer as often as instructed. Your pain medicine is not giving enough relief for you to use the spirometer as told. You have a fever. Get help right away if: You develop shortness of breath. You develop a cough with bloody mucus from the lungs. You  have fluid or blood coming  from an incision site after you cough. Summary An incentive spirometer is a tool that can help you learn to take long, deep breaths to keep your lungs clear and active. You may be asked to use a spirometer after a surgery, if you have a lung problem or a history of smoking, or if you have been inactive for a long period of time. Use your incentive spirometer as instructed every 1-2 hours while you are awake. If you have an incision on your chest or abdomen, place a pillow or a rolled-up towel firmly against your incision when you cough. This will help to reduce pain. Get help right away if you have shortness of breath, you cough up bloody mucus, or blood comes from your incision when you cough. This information is not intended to replace advice given to you by your health care provider. Make sure you discuss any questions you have with your health care provider. Document Revised: 06/05/2019 Document Reviewed: 06/05/2019 Elsevier Patient Education  2023 [employer]     Preoperative Educational Videos for Total Hip, Knee and Shoulder Replacements  To better prepare for surgery, please view our videos that explain the physical activity and discharge planning required to have the best surgical recovery at [facility].  [email or web address]  Questions? Call [phone] or email [email or web address]     Preparing for Total Shoulder Arthroplasty  Before surgery, you can play an important role by reducing the number of germs on your skin by using the following products:  Benzoyl Peroxide Gel  o Reduces the number of germs present on the skin  o Applied twice a day to shoulder area starting two days before surgery  Chlorhexidine Gluconate (CHG) Soap  o An antiseptic cleaner that kills germs and bonds with the skin to continue killing germs even after washing  o Used for showering the night before  surgery and morning of surgery  BENZOYL PEROXIDE 5% GEL                               Please do not use if you have an allergy to benzoyl peroxide. If your skin becomes reddened/irritated stop using the benzoyl peroxide.  Starting two days before surgery, apply as follows:  1. Apply benzoyl peroxide in the morning and at night. Apply after taking a shower. If you are not taking a shower, clean entire shoulder front, back, and side along with the armpit with a clean wet washcloth.  2. Place a quarter-sized dollop on your shoulder and rub in thoroughly, making sure to cover the front, back, and side of your shoulder, along with the armpit.  2 days before ____ AM ____ PM 1 day before ____ AM ____ PM  3. Do this twice a day for two days. (Last application is the night before surgery, AFTER using the CHG soap).  4. Do NOT apply benzoyl peroxide gel on the day of surgery.

## 2024-04-05 NOTE — Progress Notes (Signed)
 Instruction were left in the [place] office. I have been mailed the patient's address.

## 2024-04-05 NOTE — Progress Notes (Signed)
 Cardiac Clearance note from 03-09-24:  Helen Curly Ford, Helen Mccormick Physician Specialty: Cardiology   Progress Notes    Signed   Encounter Date: 03/09/2024 Note Received: 04/05/2024  9:19 AM   Signed     Consultation    Reason for referral:    Chief Complaint  Patient presents with   New Patient   Date of Service: 03/09/2024 Date of Birth: 05/16/1940 PCP: Helen La, Helen Mccormick  History of Present Illness: Helen Mccormick is a 84 y.o.female patient who has a history of hypertension, hyperlipidemia, mild OSA intolerant to CPAP, hypothyroidism, NICM (EF as low as 40% in 2021 that has since recovered), left bundle branch block, infrarenal aortic atherosclerosis   03/09/2024  Kindly referred by Self for further cardiac care.    Per her prior cardiology notes: Echo 2021 EF 40% Previous cardiac CTA with normal coronary arteries.  Echo 07/2019 with EF 57%, moderate MR/TR.  Lexiscan  Myoview  10/03/2019 was low risk with no evidence of ischemia. There was noted large in size moderate severity fixed defect involving septum consistent with artifact due to LBBB. EF 61%.   She takes  Entresto  97-103 Lasix  20 mg daily PRN.  She has not needed this in some time. Carvedilol  6.25 BID Isordil  30 mg daily Jardiance  25   She notes sleepiness and fatigue.  She does not use her CPAP machine.  She describes orthostatic lightheadedness on occasion.  No syncope or falls.   Patient denies chest pain, dyspnea at rest or with exertion, palpitations, syncope, edema, changes in weight, paroxysmal nocturnal dyspnea and orthopnea.     Exercise: She walks on a track 1.25 miles at a time. No CV symptoms.  If she were to walk up stairs or an incline she would develop dyspnea, however she feels well on a flat surface.       Review of Systems  Constitutional: Negative for chills and fever.  HENT:  Negative for nosebleeds.   Eyes:  Negative for blurred vision.  Respiratory:  Negative for cough.   Hematologic/Lymphatic:  Negative for bleeding problem. Does not bruise/bleed easily.  Skin:  Negative for rash.  Musculoskeletal:  Negative for muscle cramps, muscle weakness and myalgias.  Gastrointestinal:  Negative for hematemesis and hematochezia.  Genitourinary:  Negative for hematuria.  Neurological:  Negative for dizziness.  Psychiatric/Behavioral:  Negative for altered mental status.          Patient Active Problem List  Diagnosis   DDD (degenerative disc disease), lumbar   DDD (degenerative disc disease), cervical   Cervical radiculitis   Left shoulder pain   DM (diabetes mellitus), type 2 (CMS/HHS-HCC)   Fracture of distal end of radius   Gastritis   Hyperlipidemia due to type 2 diabetes mellitus (CMS/HHS-HCC)   Hypertension   Hypothyroidism, postsurgical   Impingement syndrome of right shoulder region   Macular degeneration of both eyes   Microcytic anemia   Obstructive sleep apnea syndrome   Allergic rhinitis due to animal (cat) (dog) hair and dander   Allergic rhinitis due to pollen   Gastro-esophageal reflux disease without esophagitis   Nonischemic cardiomyopathy (CMS/HHS-HCC)   Allergic rhinitis   Chronic allergic conjunctivitis   Mild intermittent asthma (HHS-HCC)   Aortic atherosclerosis   Rotator cuff arthropathy, left      Past Medical and Surgical History        Past Medical History:  Diagnosis Date   Anxiety     Aortic atherosclerosis     Chickenpox     GERD (gastroesophageal  reflux disease) 2006   History of vertebral compression fracture 09/2022    s/p  BALLOON KYPHOPLASTY AT T12   Hyperlipidemia 2006   Hyperparathyroidism (HHS-HCC)     Hypertension     Hypothyroidism     Migraines     Nonischemic cardiomyopathy (CMS/HHS-HCC) 09/27/2019   Osteoporosis     Shingles     Sleep apnea     Type 2 diabetes mellitus (CMS/HHS-HCC)      Past Surgical History She has a past surgical history that includes Back surgery (2001); Neck dissection; Left Hip Surgery;  Hysterectomy; Colonoscopy (11/17/2019); and egd (11/17/2019).    Medications and Allergies          Current Outpatient Medications  Medication Sig Dispense Refill   acetaminophen  (TYLENOL ) 325 MG tablet Take 650 mg by mouth every 6 (six) hours as needed          albuterol  90 mcg/actuation inhaler Inhale 2 inhalations into the lungs every 6 (six) hours as needed for Wheezing 1 each 1   alendronate (FOSAMAX) 70 MG tablet Take 1 tablet (70 mg total) by mouth every 7 (seven) days Take on an empty stomach with a full glass of water. Avoid mineral or well water. Do not eat or take other medications for at least 30 minutes after dose. Sit or stand for at least 30 minutes after dose. 12 tablet 3   ARNUITY ELLIPTA  100 mcg/actuation DsDv Inhale 1 Puff into the lungs once daily       ascorbic acid (VITAMIN C ORAL) Take 1 tablet by mouth once daily          aspirin  81 MG EC tablet Take 81 mg by mouth once daily       atorvastatin  (LIPITOR) 20 MG tablet Take 20 mg by mouth once daily       azelastine (ASTELIN) 137 mcg nasal spray Place 2 sprays into both nostrils 2 (two) times daily          betamethasone  dipropionate (DIPROLENE ) 0.05 % cream Apply topically 2 (two) times daily          blood glucose diagnostic test strip 1 each (1 strip total) once daily 100 each 11   blood glucose meter kit as directed Whichever is preferred by insurance 1 each 0   calcium  carbonate (CALCIUM  600 ORAL) Take 600 mg by mouth once daily          cetirizine (ZYRTEC) 10 MG tablet Take 1 tablet by mouth once daily       dicyclomine  (BENTYL ) 10 mg capsule Take 10 mg by mouth 3 (three) times daily as needed          ergocalciferol , vitamin D2, 1,250 mcg (50,000 unit) capsule Take 50,000 Units by mouth once daily          erythromycin (ROMYCIN) ophthalmic ointment         famotidine  (PEPCID ) 20 MG tablet Take 20 mg by mouth 2 (two) times daily          FUROsemide  (LASIX ) 20 MG tablet         isosorbide  dinitrate (ISORDIL ) 30 MG  tablet Take 1 tablet (30 mg total) by mouth 2 (two) times daily 60 tablet 11   JARDIANCE  25 mg tablet Take 1 tablet by mouth once daily       meloxicam  (MOBIC ) 15 MG tablet Take 1 tablet (15 mg total) by mouth once daily 30 tablet 0   multivitamin tablet Take 1 tablet by mouth once  daily          nitroGLYcerin  (NITROSTAT ) 0.4 MG SL tablet Place 0.4 mg under the tongue every 5 (five) minutes as needed          polyethylene glycol (MIRALAX ) powder Take 17 g by mouth once daily       pyridoxine, vitamin B6, (B-6) 100 MG tablet Take 100 mg by mouth once daily          RESTASIS  0.05 % ophthalmic emulsion         VITAMIN A ORAL Take 3,000 mcg by mouth once daily          vitamin B complex (B COMPLEX ORAL) Take 1 tablet by mouth once daily          budesonide -formoterol (SYMBICORT) 80-4.5 mcg/actuation inhaler Inhale 2 inhalations into the lungs 2 (two) times daily       carvediloL  (COREG ) 6.25 MG tablet Take 6.25 mg by mouth 2 (two) times daily       docosahexaenoic acid-epa 120-180 mg Cap Take 1 capsule by mouth once daily (Patient not taking: Reported on 03/09/2024)       ENTRESTO  97-103 mg tablet         escitalopram  oxalate (LEXAPRO ) 10 MG tablet Take 1 tablet by mouth once daily       fluticasone  (FLONASE ) 50 mcg/actuation nasal spray Place 1 spray into both nostrils once daily    (Patient not taking: Reported on 03/09/2024)       garlic 1,000 mg Cap Take 1,000 mg by mouth once daily    (Patient not taking: Reported on 03/09/2024)       HYDROcodone -acetaminophen  (NORCO) 5-325 mg tablet Take 1 tablet by mouth every 6 (six) hours as needed for Pain for up to 12 doses (Patient not taking: Reported on 03/09/2024) 12 tablet 0   levothyroxine  (SYNTHROID ) 75 MCG tablet TAKE 1 TABLET(75 MCG) BY MOUTH EVERY DAY (Patient not taking: Reported on 03/09/2024) 90 tablet 3    No current facility-administered medications for this visit.    Allergies: Tramadol , Etodolac, Isosorbide  dinitrate, Norvasc   [amlodipine ], and Codeine   Social and Family History    Social History         Tobacco Use   Smoking status: Never      Passive exposure: Never   Smokeless tobacco: Never  Vaping Use   Vaping status: Never Used  Substance Use Topics   Alcohol use: Never   Drug use: No    Family History: family history includes Cancer in her sister; Diabetes in her sister and sister; Myocardial Infarction (Heart attack) in her mother; Sickle cell anemia in her mother; Stroke in her brother, brother, and father.   Physical Examination  Vitals:BP (!) 154/74   Pulse 73   Wt 70.8 kg (156 lb)   SpO2 97%   BMI 26.78 kg/m   Physical Exam  Constitutional: She appears healthy.  HENT: Mouth/Throat: Oropharynx is clear.  Eyes: Conjunctivae are normal.  Neck: No JVD present.  Cardiovascular: Normal rate, regular rhythm, normal heart sounds and normal pulses.  No murmur heard. Pulmonary/Chest: Breath sounds normal.  Abdominal: Soft.  Musculoskeletal:        General: No edema. Normal range of motion.     Cervical back: Normal range of motion.  Neurological: She is alert and oriented to person, place, and time.  Skin: Skin is warm and dry.     Lab and Other Data       Last Lipids:  Lab Results  Component Value Date    CHOLTOTAL 134 04/12/2019    LDLCALC 71 04/12/2019    HDL 38.9 04/12/2019    TRIG 120 04/12/2019         Lab Results  Component Value Date    CREATININE 0.8 02/21/2024    BUN 27 (H) 02/21/2024    NA 143 02/21/2024    K 3.6 02/21/2024    CL 106 02/21/2024    CO2 29.0 02/21/2024         Lab Results  Component Value Date    WBC 4.3 10/14/2020    HGB 11.4 (L) 10/14/2020    HCT 35.7 10/14/2020    MCV 76.1 (L) 10/14/2020    PLT 180 10/14/2020         Lab Results  Component Value Date    TSH 5.295 09/04/2020         Lab Results  Component Value Date    HGBA1C 7.3 (H) 09/04/2020    HGBA1C 7.3 (H) 07/25/2020         Lab Results  Component Value Date     CKMB 4 07/12/2012    Echocardiogram: 10/08/23  1. Left ventricular ejection fraction, by estimation, is 50 to 55%. The left ventricle has low normal function. The left ventricle has no regional wall motion abnormalities. There is mild left ventricular hypertrophy. Left ventricular diastolic  parameters are consistent with Grade I diastolic dysfunction (impaired relaxation).   2. Right ventricular systolic function is normal. The right ventricular size is normal.   3. The mitral valve is normal in structure. Mild mitral valve regurgitation.   4. The aortic valve is tricuspid. There is mild thickening of the aortic valve. Aortic valve regurgitation is not visualized.   5. The inferior vena cava is normal in size with greater than 50% respiratory variability, suggesting right atrial pressure of 3 mmHg.        Assessment and Plan    Hypertension: Goal less than 130/80.  She is not optimized today.  She plans to have surgery next month therefore I do not want to aggressively decrease her blood pressure. - For now, I will have her take isosorbide  dinitrate 30 mg on a more standard twice daily basis.  - If this doesn't achieve the desired goal, consider addition of spironolactone or slightly increase carvedilol  if heart rate can tolerate - She will continue Entresto  at current dosage for now - DASH diet - Recommend a minimum goal of 150 minutes of moderate intensity aerobic exercise weekly with at least two days per week of weight training.    Heart failure with recovered ejection fraction: Patient has NYHA class I/II symptoms.  She is euvolemic today.  Her LVEF earlier this year was 50 to 55%. - She will continue her current regimen as above.  She takes Lasix  on an as needed basis and has not needed any in some time.   Preoperative Risk Assessment: Patient is a moderate risk for this intermediate risk surgery in the setting of congestive heart failure.  Her recent LV function is stable.  She denies  any significant dyspnea.  Her ECG shows a left bundle branch block which is chronic.  In this setting, she does not need any further cardiac testing prior to this intermediate risk surgery.   Aortic atherosclerosis: Patient denies any claudication symptoms. -Recommend statin therapy with an LDL target less than 70 -She is on aspirin  81 daily -Blood pressure control as above   Follow-up  in 6 months   Thank you for giving me the opportunity to care for your patient. Please do not hesitate to contact me with any questions.   I spent a total of 60 minutes in both face-to-face and non-face-to-face activities, excluding procedures performed, for this visit on the date of this encounter.         Requested Prescriptions          Signed Prescriptions Disp Refills   isosorbide  dinitrate (ISORDIL ) 30 MG tablet 60 tablet 11      Sig: Take 1 tablet (30 mg total) by mouth 2 (two) times daily    Non-Medicine Orders:    Orders Placed This Encounter  Procedures   ECG 12-lead     Future Appointments       Date/Time Provider Department Center Visit Type    03/20/2024 2:45 PM Solum, Therisa Setter, Helen Mccormick [facility] C RETURN VISIT    04/05/2024 10:45 AM Tobie Earnestine Clamp, Helen Mccormick [facility] C RETURN VISIT    04/14/2024 1:00 PM Douglass Longs, PT [facility] C EVALUATION 60    04/26/2024 10:15 AM Kip Lynwood Double, PA [facility] C POST-OP    05/24/2024 11:00 AM Tobie Earnestine Clamp, Helen Mccormick [facility] MARYL [facility]OCK[facility] POST-OP                    Electronically signed by Helen Curly Ford, Helen Mccormick at 03/09/2024  5:44 PM    Initial consult on 03/09/2024 Note shared with patient in the original system   Received From: [facility]

## 2024-04-07 ENCOUNTER — Other Ambulatory Visit: Payer: Self-pay | Admitting: Internal Medicine

## 2024-04-10 ENCOUNTER — Other Ambulatory Visit: Payer: Self-pay

## 2024-04-10 DIAGNOSIS — I10 Essential (primary) hypertension: Secondary | ICD-10-CM

## 2024-04-10 DIAGNOSIS — R42 Dizziness and giddiness: Secondary | ICD-10-CM

## 2024-04-10 DIAGNOSIS — E1169 Type 2 diabetes mellitus with other specified complication: Secondary | ICD-10-CM

## 2024-04-10 DIAGNOSIS — I428 Other cardiomyopathies: Secondary | ICD-10-CM

## 2024-04-11 ENCOUNTER — Ambulatory Visit

## 2024-04-11 ENCOUNTER — Encounter
Admission: RE | Disposition: A | Discharge status: Home / Self Care | Payer: Self-pay | Source: Home / Self Care | Attending: Orthopedic Surgery

## 2024-04-11 ENCOUNTER — Encounter: Payer: Self-pay | Admitting: Orthopedic Surgery

## 2024-04-11 ENCOUNTER — Ambulatory Visit
Admission: RE | Admit: 2024-04-11 | Discharge: 2024-04-11 | Disposition: A | Attending: Orthopedic Surgery | Admitting: Orthopedic Surgery

## 2024-04-11 ENCOUNTER — Other Ambulatory Visit: Payer: Self-pay

## 2024-04-11 DIAGNOSIS — M25711 Osteophyte, right shoulder: Secondary | ICD-10-CM | POA: Diagnosis not present

## 2024-04-11 DIAGNOSIS — E039 Hypothyroidism, unspecified: Secondary | ICD-10-CM | POA: Insufficient documentation

## 2024-04-11 DIAGNOSIS — J45909 Unspecified asthma, uncomplicated: Secondary | ICD-10-CM | POA: Diagnosis not present

## 2024-04-11 DIAGNOSIS — I2510 Atherosclerotic heart disease of native coronary artery without angina pectoris: Secondary | ICD-10-CM | POA: Insufficient documentation

## 2024-04-11 DIAGNOSIS — G4730 Sleep apnea, unspecified: Secondary | ICD-10-CM | POA: Insufficient documentation

## 2024-04-11 DIAGNOSIS — I110 Hypertensive heart disease with heart failure: Secondary | ICD-10-CM | POA: Diagnosis not present

## 2024-04-11 DIAGNOSIS — E119 Type 2 diabetes mellitus without complications: Secondary | ICD-10-CM | POA: Diagnosis not present

## 2024-04-11 DIAGNOSIS — F32A Depression, unspecified: Secondary | ICD-10-CM | POA: Insufficient documentation

## 2024-04-11 DIAGNOSIS — M75121 Complete rotator cuff tear or rupture of right shoulder, not specified as traumatic: Secondary | ICD-10-CM | POA: Diagnosis present

## 2024-04-11 DIAGNOSIS — K219 Gastro-esophageal reflux disease without esophagitis: Secondary | ICD-10-CM | POA: Diagnosis not present

## 2024-04-11 DIAGNOSIS — Z01812 Encounter for preprocedural laboratory examination: Secondary | ICD-10-CM

## 2024-04-11 DIAGNOSIS — F419 Anxiety disorder, unspecified: Secondary | ICD-10-CM | POA: Insufficient documentation

## 2024-04-11 DIAGNOSIS — I509 Heart failure, unspecified: Secondary | ICD-10-CM | POA: Diagnosis not present

## 2024-04-11 HISTORY — PX: BICEPT TENODESIS: SHX5116

## 2024-04-11 HISTORY — PX: REVERSE SHOULDER ARTHROPLASTY: SHX5054

## 2024-04-11 LAB — GLUCOSE, CAPILLARY
Glucose-Capillary: 103 mg/dL — ABNORMAL HIGH (ref 70–99)
Glucose-Capillary: 113 mg/dL — ABNORMAL HIGH (ref 70–99)

## 2024-04-11 SURGERY — ARTHROPLASTY, SHOULDER, TOTAL, REVERSE
Anesthesia: General | Site: Shoulder | Laterality: Right

## 2024-04-11 MED ORDER — MIDAZOLAM HCL (PF) 2 MG/2ML IJ SOLN: 1.0000 mg | Freq: Once | INTRAMUSCULAR | Status: DC

## 2024-04-11 MED ORDER — DEXAMETHASONE SOD PHOSPHATE PF 10 MG/ML IJ SOLN
INTRAMUSCULAR | Status: AC
  Filled 2024-04-11: qty 1

## 2024-04-11 MED ORDER — ORAL CARE MOUTH RINSE: 15.0000 mL | Freq: Once | OROMUCOSAL | Status: AC

## 2024-04-11 MED ORDER — SUGAMMADEX SODIUM 200 MG/2ML IV SOLN
INTRAVENOUS | Status: DC | PRN
  Administered 2024-04-11: 200 mg via INTRAVENOUS

## 2024-04-11 MED ORDER — PHENYLEPHRINE HCL-NACL 20-0.9 MG/250ML-% IV SOLN
INTRAVENOUS | Status: DC | PRN
  Administered 2024-04-11: 30 ug/min via INTRAVENOUS

## 2024-04-11 MED ORDER — LIDOCAINE HCL (PF) 1 % IJ SOLN
INTRAMUSCULAR | Status: DC | PRN
  Administered 2024-04-11: 3 mL via SUBCUTANEOUS

## 2024-04-11 MED ORDER — SODIUM CHLORIDE 0.9 % IR SOLN
Status: DC | PRN
  Administered 2024-04-11: 1000 mL

## 2024-04-11 MED ORDER — ONDANSETRON HCL 4 MG/2ML IJ SOLN
INTRAMUSCULAR | Status: DC | PRN
  Administered 2024-04-11: 4 mg via INTRAVENOUS

## 2024-04-11 MED ORDER — OXYCODONE HCL 5 MG PO TABS: 5.0000 mg | ORAL_TABLET | ORAL | 0 refills | Status: AC | PRN

## 2024-04-11 MED ORDER — FENTANYL CITRATE (PF) 50 MCG/ML IJ SOSY
PREFILLED_SYRINGE | INTRAMUSCULAR | Status: AC
  Filled 2024-04-11: qty 1

## 2024-04-11 MED ORDER — BUPIVACAINE LIPOSOME 1.3 % IJ SUSP
INTRAMUSCULAR | Status: AC
  Filled 2024-04-11: qty 20

## 2024-04-11 MED ORDER — EPHEDRINE SULFATE-NACL 50-0.9 MG/10ML-% IV SOSY
PREFILLED_SYRINGE | INTRAVENOUS | Status: DC | PRN
  Administered 2024-04-11: 10 mg via INTRAVENOUS

## 2024-04-11 MED ORDER — LIDOCAINE HCL (PF) 1 % IJ SOLN
INTRAMUSCULAR | Status: AC
  Filled 2024-04-11: qty 5

## 2024-04-11 MED ORDER — FENTANYL CITRATE (PF) 100 MCG/2ML IJ SOLN
INTRAMUSCULAR | Status: AC
  Filled 2024-04-11: qty 2

## 2024-04-11 MED ORDER — ONDANSETRON 4 MG PO TBDP: 4.0000 mg | ORAL_TABLET | Freq: Three times a day (TID) | ORAL | 0 refills | Status: AC | PRN

## 2024-04-11 MED ORDER — CHLORHEXIDINE GLUCONATE 0.12 % MT SOLN
15.0000 mL | Freq: Once | OROMUCOSAL | Status: AC
  Administered 2024-04-11: 15 mL via OROMUCOSAL

## 2024-04-11 MED ORDER — BUPIVACAINE LIPOSOME 1.3 % IJ SUSP
INTRAMUSCULAR | Status: DC | PRN
  Administered 2024-04-11: 20 mL via PERINEURAL

## 2024-04-11 MED ORDER — PROPOFOL 10 MG/ML IV BOLUS
INTRAVENOUS | Status: DC | PRN
  Administered 2024-04-11: 30 mg via INTRAVENOUS
  Administered 2024-04-11: 100 mg via INTRAVENOUS
  Administered 2024-04-11: 50 mg via INTRAVENOUS

## 2024-04-11 MED ORDER — FENTANYL CITRATE (PF) 50 MCG/ML IJ SOSY
50.0000 ug | PREFILLED_SYRINGE | Freq: Once | INTRAMUSCULAR | Status: AC
  Administered 2024-04-11: 50 ug via INTRAVENOUS

## 2024-04-11 MED ORDER — MIDAZOLAM HCL (PF) 2 MG/2ML IJ SOLN
1.0000 mg | Freq: Once | INTRAMUSCULAR | Status: AC
  Administered 2024-04-11: 1 mg via INTRAVENOUS

## 2024-04-11 MED ORDER — IRRISEPT - 450ML BOTTLE WITH 0.05% CHG IN STERILE WATER, USP 99.95% OPTIME
TOPICAL | Status: DC | PRN
  Administered 2024-04-11: 450 mL

## 2024-04-11 MED ORDER — ACETAMINOPHEN 500 MG PO TABS: 1000.0000 mg | ORAL_TABLET | Freq: Three times a day (TID) | ORAL | 2 refills | Status: AC

## 2024-04-11 MED ORDER — ACETAMINOPHEN 10 MG/ML IV SOLN
INTRAVENOUS | Status: AC
  Filled 2024-04-11: qty 100

## 2024-04-11 MED ORDER — ACETAMINOPHEN 10 MG/ML IV SOLN
INTRAVENOUS | Status: DC | PRN
  Administered 2024-04-11: 1000 mg via INTRAVENOUS

## 2024-04-11 MED ORDER — TRANEXAMIC ACID-NACL 1000-0.7 MG/100ML-% IV SOLN
1000.0000 mg | INTRAVENOUS | Status: AC
  Administered 2024-04-11: 1000 mg via INTRAVENOUS

## 2024-04-11 MED ORDER — SODIUM CHLORIDE 0.9 % IV SOLN: INTRAVENOUS | Status: DC

## 2024-04-11 MED ORDER — CEFAZOLIN SODIUM-DEXTROSE 2-4 GM/100ML-% IV SOLN
2.0000 g | INTRAVENOUS | Status: AC
  Administered 2024-04-11: 2 g via INTRAVENOUS

## 2024-04-11 MED ORDER — TRANEXAMIC ACID-NACL 1000-0.7 MG/100ML-% IV SOLN
INTRAVENOUS | Status: AC
  Filled 2024-04-11: qty 100

## 2024-04-11 MED ORDER — PHENYLEPHRINE 80 MCG/ML (10ML) SYRINGE FOR IV PUSH (FOR BLOOD PRESSURE SUPPORT)
PREFILLED_SYRINGE | INTRAVENOUS | Status: DC | PRN
  Administered 2024-04-11: 80 ug via INTRAVENOUS
  Administered 2024-04-11: 160 ug via INTRAVENOUS

## 2024-04-11 MED ORDER — FENTANYL CITRATE (PF) 50 MCG/ML IJ SOSY: 50.0000 ug | PREFILLED_SYRINGE | Freq: Once | INTRAMUSCULAR | Status: DC

## 2024-04-11 MED ORDER — FENTANYL CITRATE (PF) 100 MCG/2ML IJ SOLN
INTRAMUSCULAR | Status: DC | PRN
  Administered 2024-04-11: 25 ug via INTRAVENOUS
  Administered 2024-04-11: 50 ug via INTRAVENOUS
  Administered 2024-04-11: 25 ug via INTRAVENOUS

## 2024-04-11 MED ORDER — ROCURONIUM BROMIDE 10 MG/ML (PF) SYRINGE
PREFILLED_SYRINGE | INTRAVENOUS | Status: AC
  Filled 2024-04-11: qty 10

## 2024-04-11 MED ORDER — BUPIVACAINE HCL (PF) 0.5 % IJ SOLN
INTRAMUSCULAR | Status: AC
  Filled 2024-04-11: qty 10

## 2024-04-11 MED ORDER — CHLORHEXIDINE GLUCONATE 0.12 % MT SOLN
OROMUCOSAL | Status: AC
  Filled 2024-04-11: qty 15

## 2024-04-11 MED ORDER — ASPIRIN 325 MG PO TBEC: 325.0000 mg | DELAYED_RELEASE_TABLET | Freq: Every day | ORAL | 0 refills | Status: AC

## 2024-04-11 MED ORDER — MIDAZOLAM HCL 2 MG/2ML IJ SOLN
INTRAMUSCULAR | Status: AC
  Filled 2024-04-11: qty 2

## 2024-04-11 MED ORDER — 0.9 % SODIUM CHLORIDE (POUR BTL) OPTIME
TOPICAL | Status: DC | PRN
  Administered 2024-04-11: 500 mL

## 2024-04-11 MED ORDER — DROPERIDOL 2.5 MG/ML IJ SOLN
0.6250 mg | Freq: Once | INTRAMUSCULAR | Status: AC | PRN
  Administered 2024-04-11: 0.625 mg via INTRAVENOUS

## 2024-04-11 MED ORDER — CEFAZOLIN SODIUM-DEXTROSE 2-4 GM/100ML-% IV SOLN
2.0000 g | Freq: Once | INTRAVENOUS | Status: AC
  Administered 2024-04-11: 2 g via INTRAVENOUS

## 2024-04-11 MED ORDER — DEXAMETHASONE SOD PHOSPHATE PF 10 MG/ML IJ SOLN
INTRAMUSCULAR | Status: DC | PRN
  Administered 2024-04-11: 5 mg via INTRAVENOUS

## 2024-04-11 MED ORDER — VANCOMYCIN HCL 1000 MG IV SOLR
INTRAVENOUS | Status: DC | PRN
  Administered 2024-04-11: 1000 mg via TOPICAL

## 2024-04-11 MED ORDER — CEFAZOLIN SODIUM-DEXTROSE 2-4 GM/100ML-% IV SOLN
INTRAVENOUS | Status: AC
  Filled 2024-04-11: qty 100

## 2024-04-11 MED ORDER — FENTANYL CITRATE (PF) 100 MCG/2ML IJ SOLN: 25.0000 ug | INTRAMUSCULAR | Status: DC | PRN

## 2024-04-11 MED ORDER — SUCCINYLCHOLINE CHLORIDE 200 MG/10ML IV SOSY
PREFILLED_SYRINGE | INTRAVENOUS | Status: DC | PRN
  Administered 2024-04-11: 140 mg via INTRAVENOUS

## 2024-04-11 MED ORDER — GLYCOPYRROLATE 0.2 MG/ML IJ SOLN
INTRAMUSCULAR | Status: DC | PRN
  Administered 2024-04-11: .1 mg via INTRAVENOUS

## 2024-04-11 MED ORDER — PROPOFOL 10 MG/ML IV BOLUS
INTRAVENOUS | Status: AC
  Filled 2024-04-11: qty 20

## 2024-04-11 MED ORDER — VANCOMYCIN HCL 1000 MG IV SOLR
INTRAVENOUS | Status: AC
  Filled 2024-04-11: qty 40

## 2024-04-11 MED ORDER — ROCURONIUM BROMIDE 100 MG/10ML IV SOLN
INTRAVENOUS | Status: DC | PRN
  Administered 2024-04-11: 40 mg via INTRAVENOUS

## 2024-04-11 MED ORDER — DROPERIDOL 2.5 MG/ML IJ SOLN
INTRAMUSCULAR | Status: AC
  Filled 2024-04-11: qty 2

## 2024-04-11 MED ORDER — ONDANSETRON HCL 4 MG/2ML IJ SOLN
INTRAMUSCULAR | Status: AC
  Filled 2024-04-11: qty 2

## 2024-04-11 MED ORDER — BUPIVACAINE HCL (PF) 0.5 % IJ SOLN
INTRAMUSCULAR | Status: DC | PRN
  Administered 2024-04-11: 10 mL via PERINEURAL

## 2024-04-11 SURGICAL SUPPLY — 61 items
BASEPLATE P2 COATD GLND 6.5X30 (Shoulder) IMPLANT
BIT DRILL 12.7X2STRG SHNK (BIT) IMPLANT
BLADE SAGITTAL WIDE XTHICK NO (BLADE) ×1 IMPLANT
CHLORAPREP W/TINT 26 (MISCELLANEOUS) ×1 IMPLANT
COOLER ICEMAN CLASSIC (MISCELLANEOUS) ×1 IMPLANT
DERMABOND ADVANCED .7 DNX12 (GAUZE/BANDAGES/DRESSINGS) IMPLANT
DRAPE INCISE IOBAN 66X45 STRL (DRAPES) ×1 IMPLANT
DRAPE SHEET LG 3/4 BI-LAMINATE (DRAPES) ×1 IMPLANT
DRAPE TABLE BACK 80X90 (DRAPES) ×1 IMPLANT
DRILL GLEN ALTIVATE 3.5 (DRILL) IMPLANT
DRSG OPSITE POSTOP 4X8 (GAUZE/BANDAGES/DRESSINGS) ×1 IMPLANT
DRSG TEGADERM 2-3/8X2-3/4 SM (GAUZE/BANDAGES/DRESSINGS) ×1 IMPLANT
ELECTRODE REM PT RTRN 9FT ADLT (ELECTROSURGICAL) ×1 IMPLANT
EVACUATOR 1/8 PVC DRAIN (DRAIN) ×1 IMPLANT
GAUZE SPONGE 2X2 STRL 8-PLY (GAUZE/BANDAGES/DRESSINGS) ×1 IMPLANT
GAUZE XEROFORM 1X8 LF (GAUZE/BANDAGES/DRESSINGS) IMPLANT
GLOVE BIOGEL PI IND STRL 8 (GLOVE) ×2 IMPLANT
GLOVE PI ULTRA LF STRL 7.5 (GLOVE) ×2 IMPLANT
GLOVE SURG ORTHO 8.0 STRL STRW (GLOVE) ×2 IMPLANT
GOWN SRG XL LONG LVL 3 NONREIN (GOWNS) ×3 IMPLANT
GUIDE BONE MODEL RSA DJO (ORTHOPEDIC DISPOSABLE SUPPLIES) IMPLANT
GUIDE WIRE ALTIVATE 2.4X228 SL (WIRE) IMPLANT
GUIDEWIRE GLENOID 2.5X220 (WIRE) IMPLANT
HOOD PEEL AWAY T7 (MISCELLANEOUS) ×3 IMPLANT
INSERT SMALL SOCKET 32MM NEU (Insert) IMPLANT
IV 0.9% NACL 1000 ML (IV SOLUTION) ×1 IMPLANT
KIT STABILIZATION SHOULDER (MISCELLANEOUS) ×1 IMPLANT
LAVAGE JET IRRISEPT WOUND (IRRIGATION / IRRIGATOR) IMPLANT
MANIFOLD NEPTUNE II (INSTRUMENTS) ×1 IMPLANT
MASK FACE SPIDER DISP (MASK) ×1 IMPLANT
MAT ABSORB FLUID 56X50 GRAY (MISCELLANEOUS) ×1 IMPLANT
NEEDLE REVERSE CUT 1/2 CRC (NEEDLE) IMPLANT
NEEDLE SPNL 20GX3.5 QUINCKE YW (NEEDLE) IMPLANT
NS IRRIG 500ML POUR BTL (IV SOLUTION) ×1 IMPLANT
PACK ARTHROSCOPY SHOULDER (MISCELLANEOUS) ×1 IMPLANT
PAD ARMBOARD POSITIONER FOAM (MISCELLANEOUS) ×2 IMPLANT
PAD COLD SHLDR SM WRAP-ON (PAD) ×1 IMPLANT
PENCIL SMOKE EVACUATOR (MISCELLANEOUS) ×1 IMPLANT
SCREW PERI ALTIVATE REV 14 (Screw) IMPLANT
SCREW PERI ALTIVATE REV 30 (Screw) IMPLANT
SCREW PERI ALTIVATE REV 34 (Screw) IMPLANT
SCREW RETAIN W/HEAD 4MM OFFSET (Shoulder) IMPLANT
SOLN STERILE WATER BTL 1000 ML (IV SOLUTION) ×1 IMPLANT
SPONGE T-LAP 18X18 ~~LOC~~+RFID (SPONGE) ×2 IMPLANT
STAPLER SKIN PROX 35W (STAPLE) IMPLANT
STEM HUMERAL SM SHELL SHOU 10 (Miscellaneous) IMPLANT
STRAP SAFETY 5IN WIDE (MISCELLANEOUS) ×1 IMPLANT
SUT MNCRL AB 4-0 PS2 18 (SUTURE) IMPLANT
SUT PROLENE 6 0 P 1 18 (SUTURE) IMPLANT
SUT TICRON 2-0 30IN 311381 (SUTURE) ×2 IMPLANT
SUT VIC AB 0 CT1 36 (SUTURE) ×1 IMPLANT
SUT VIC AB 2-0 CT2 27 (SUTURE) ×2 IMPLANT
SUT XBRAID 1.4 BLK/WHT (SUTURE) IMPLANT
SUT XBRAID 1.4 BLUE (SUTURE) IMPLANT
SUT XBRAID 1.4 WHITE/BLUE (SUTURE) IMPLANT
SUT XBRAID 2 BLACK/BLUE (SUTURE) IMPLANT
SUTURE ETHBND 5-0 MS/4 CCS GRN (SUTURE) IMPLANT
SUTURE FIBERWR #2 38 BLUE 1/2 (SUTURE) ×4 IMPLANT
TAP CANN GLEN 6.5 (TAP) IMPLANT
TIP FAN IRRIG PULSAVAC PLUS (DISPOSABLE) ×1 IMPLANT
TRAP FLUID SMOKE EVACUATOR (MISCELLANEOUS) ×1 IMPLANT

## 2024-04-11 NOTE — Transfer of Care (Signed)
 Immediate Anesthesia Transfer of Care Note  Patient: Helen Mccormick  Procedure(s) Performed: ARTHROPLASTY, SHOULDER, TOTAL, REVERSE (Right: Shoulder) TENODESIS, BICEPS (Right: Shoulder)  Patient Location: PACU  Anesthesia Type:General  Level of Consciousness: drowsy  Airway & Oxygen Therapy: Patient Spontanous Breathing and Patient connected to face mask oxygen  Post-op Assessment: Report given to RN, Post -op Vital signs reviewed and stable, and Patient moving all extremities  Post vital signs: Reviewed and stable  Last Vitals:  Vitals Value Taken Time  BP 168/75 04/11/24 10:24  Temp    Pulse 66 04/11/24 10:26  Resp 14 04/11/24 10:26  SpO2 100 % 04/11/24 10:26  Vitals shown include unfiled device data.  Last Pain:  Vitals:   04/11/24 0622  TempSrc: Temporal  PainSc: 2          Complications: No notable events documented.

## 2024-04-11 NOTE — Discharge Instructions (Addendum)
 Helen Mccormick Blanch, MD  [facility]  Phone: [phone]  Fax: [phone]   Discharge Instructions after Reverse Shoulder Replacement    1. Activity/Sling: You are to be non-weight bearing on operative extremity. A sling/shoulder immobilizer has been provided for you. Only remove the sling to perform elbow, wrist, and hand RoM exercises and hygiene/dressing. Active reaching and lifting are not permitted. You will be given further instructions on sling use at your first physical therapy visit and postoperative visit with Dr. Blanch.   2. Dressings: Dressing may be removed at 1st physical therapy visit (~3-4 days after surgery). Afterwards, you may either leave open to air (if no drainage) or cover with dry, sterile dressing. If you have steri-strips on your wound, please do not remove them. They will fall off on their own. You may shower 5 days after surgery. Please pat incision dry. Do not rub or place any shear forces across incision. If there is drainage or any opening of incision after 5 days, please notify our offices immediately.    3. Driving:  Plan on not driving for six weeks. Please note that you are advised NOT to drive while taking narcotic pain medications as you may be impaired and unsafe to drive.   4. Medications:  - You have been provided a prescription for narcotic pain medicine (usually oxycodone ). After surgery, take 1-2 narcotic tablets every 4 hours if needed for severe pain. Please start this as soon as you begin to start having pain (if you received a nerve block, start taking as soon as this wears off).  - A prescription for anti-nausea medication will be provided in case the narcotic medicine causes nausea - take 1 tablet every 6 hours only if nauseated.  - Take enteric coated aspirin  325 mg once daily for 6 weeks to prevent blood clots. Do not take aspirin  if you have an aspirin  sensitivity/allergy or asthma or are on an anticoagulant (blood thinner) already. If so, then  your home anticoagulant will be resume and managed - do not take aspirin . -Take tylenol  1000mg  (2 Extra strength or 3 regular strength tablets) every 8 hours for pain. This will reduce the amount of narcotic medication needed. May stop tylenol  when you are having minimal pain. - Take a stool softener (Colace, Dulcolax or Senakot) if you are using narcotic pain medications to help with constipation that is associated with narcotic use. - DO NOT take ANY nonsteroidal anti-inflammatory pain medications: Advil , Motrin , Ibuprofen , Aleve , Naproxen , or Naprosyn .   If you are taking prescription medication for anxiety, depression, insomnia, muscle spasm, chronic pain, or for attention deficit disorder you are advised that you are at a higher risk of adverse effects with use of narcotics post-op, including narcotic addiction/dependence, depressed breathing, death. If you use non-prescribed substances: alcohol, marijuana, cocaine, heroin, methamphetamines, etc., you are at a higher risk of adverse effects with use of narcotics post-op, including narcotic addiction/dependence, depressed breathing, death. You are advised that taking > 50 morphine  milligram equivalents (MME) of narcotic pain medication per day results in twice the risk of overdose or death. For your prescription provided: oxycodone  5 mg - taking more than 6 tablets per day after the first few days of surgery.   5. Physical Therapy: 1-2 times per week for ~12 weeks. Therapy typically starts on post operative Day 3 or 4. You have been provided an order for physical therapy. The therapist will provide home exercises. Please contact our offices if this appointment has not been scheduled.  6. Work: May do light duty/desk job in approximately 2 weeks when off of narcotics, pain is well-controlled, and swelling has decreased if able to function with one arm in sling. Full work may take 6 weeks if light motions and function of both arms is required.  Lifting jobs may require 12 weeks.   7. Post-Op Appointments: Your first post-op appointment will be with Dr. Tobie in approximately 2 weeks time.    If you find that they have not been scheduled please call the Orthopaedic Appointment front desk at [phone].                               Helen Mccormick Tobie, MD [facility] Phone: [phone] Fax: [phone]   REVERSE SHOULDER ARTHROPLASTY REHAB GUIDELINES   These guidelines should be tailored to individual patients based on their rehab goals, age, precautions, quality of repair, etc.  Progression should be based on patient progress and approval by the referring physician.  PHASE 1 - Day 1 through Week 2  GENERAL GUIDELINES AND PRECAUTIONS Sling wear 24/7 except during grooming and home exercises (3 to 5 times daily) Avoid shoulder extension such that the arm is posterior the frontal plane.  When patients recline, a pillow should be placed behind the upper arm and sling should be on.  They should be advised to always be able to see the elbow Avoid combined IR/ADD/EXT, such as hand behind back to prevent dislocation Avoid combined IR and ADD such as reaching across the chest to prevent dislocation No AROM No submersion in pool/water  for 4 weeks No weight bearing through operative arm (as in transfers, walker use, etc)  GOALS Maintain integrity of joint replacement; protect soft tissue healing Increase PROM for elevation to 120 and ER to 30 (will remain the goal for first 6 weeks) Optimize distal UE circulation and muscle activity (elbow, wrist and hand) Instruct in use of sling for proper fit, polar care device for ice application after HEP, signs/symptoms of infection  EXERCISES Active elbow, wrist and hand Passive forward elevation in scapular plane to 90-120 max motion; ER in scapular plane to 30 Active scapular retraction with arms resting in neutral position  CRITERIA TO PROGRESS TO  PHASE 2 Low pain (less than 3/10) with shoulder PROM Healing of incision without signs of infection Clearance by MD to advance after 2 week MD check up  PHASE 2 -  2 weeks - 6 weeks  GENERAL GUIDELINES AND PRECAUTIONS Sling may be removed while at home; worn in community without abduction pillow May use arm for light activities of daily living (such as feeding, brushing teeth, dressing) with elbow near  the side of the body  and arm in front of the body- no active lifting of the arm May submerge in water  (tub, pool, [place], etc) after 4 weeks Continue to avoid WBing through the operative arm Continue to avoid combined IR/EXT/ADD (hand behind the back) and IR/ADD  (reaching across chest) for dislocation precautions  GOALS  Achieve passive elevation to 120 and ER to 30  Low (less than 3/10) to no pain  Ability to fire all heads of the deltoid  EXERCISES May discontinue grip, and active elbow and wrist exercises since using the arm in ADLs  with sling removed around the home Continue passive elevation to 120 and ER to 30, both in scapular plane with arm supported on table top Add submaximal isometrics, pain free effort, for  all functional heads of deltoid (anterior, posterior, middle)  Ensure that with posterior deltoid isometric the shoulder does not move into extension and the arm remains anterior the frontal plane At 4 weeks:  begin to place arm in balanced position of 90 deg elevation in supine; when patient able to hold this position with ease, may begin reverse pendulums clockwise and counterclockwise  CRITERIA TO PROGRESS TO PHASE 3 Passive forward elevation in scapular plane to 120; passive ER in scapular plane to 30 Ability to fire isometrically all heads of the deltoid muscle without pain Ability to place and hold the arm in balanced position (90 deg elevation in supine)  PHASE 3 - 6 weeks to 3 months  GENERAL GUIDELINES AND PRECAUTIONS Discontinue use of sling Avoid  forcing end range motion in any direction to prevent dislocation  May advance use of the arm actively in ADLs without being restricted to arm by the side of the body, however, avoid heavy lifting and sports (forever!) May initiate functional IR behind the back gently NO UPPER BODY ERGOMETER   GOALS Optimize PROM for elevation and ER in scapular plane with realistic expectation that max  mobility for elevation is usually around 145-160 passively; ER 40 to 50 passively; functional IR to L1 Recover AROM to approach as close to PROM available as possible; may expect 135-150 deg active elevation; 30 deg active ER; active functional IR to L1 Establish dynamic stability of the shoulder with deltoid and periscapular muscle gradual strengthening  EXERCISES Forward elevation in scapular plane active progression: supine to incline, to vertical; short to long lever arm Balanced position long lever arm AROM Active ER/IR with arm at side Scapular retraction with light band resistance Functional IR with hand slide up back - very gentle and gradual NO UPPER BODY ERGOMETER     CRITERIA TO PROGRESS TO PHASE 4  AROM equals/approaches PROM with good mechanics for elevation   No pain  Higher level demand on shoulder than ADL functions   PHASE 4 12 months and beyond  GENERAL GUIDELINES AND PRECAUTIONS No heavy lifting and no overhead sports No heavy pushing activity Gradually increase strength of deltoid and scapular stabilizers; also the rotator cuff if present with weights not to exceed 5 lbs NO UPPER BODY ERGOMETER   GOALS  Optimize functional use of the operative UE to meet the desired demands  Gradual increase in deltoid, scapular muscle, and rotator cuff strength  Pain free functional activities   EXERCISES Add light hand weights for deltoid up to and not to exceed 3 lbs for anterior and posterior with long arm lift against gravity; elbow bent to 90 deg for abduction in scapular  plane Theraband progression for extension to hip with scapular depression/retraction Theraband progression for serratus anterior punches in supine; avoid wall, incline or prone pressups for serratus anterior End range stretching gently without forceful overpressure in all planes (elevation in scapular plane, ER in scapular plane, functional IR) with stretching done for life as part of a daily routine NO UPPER BODY ERGOMETER     CRITERIA FOR DISCHARGE FROM SKILLED PHYSICAL THERAPY  Pain free AROM for shoulder elevation (expect around 135-150)  Functional strength for all ADLs, work tasks, and hobbies approved by careers adviser  Independence with home maintenance program   NOTES: 1. With proper exercise, motion, strength, and function continue to improve even after one year. 2. The complication rate after surgery is 5 - 8%. Complications include infection, fracture, heterotopic bone formation, nerve injury, instability, rotator  cuff tear, and tuberosity nonunion. Please look for clinical signs, unusual symptoms, or lack of progress with therapy and report those to Dr. Tobie. Prefer more communication than less.  3. The therapy plan above only serves as a guide. Please be aware of specific individualized patient instructions as written on the prescription or through discussions with the surgeon. 4. Please call Dr. Tobie if you have any specific questions or concerns [phone]   Information for Discharge Teaching: EXPAREL  (bupivacaine  liposome injectable suspension)   Pain relief is important to your recovery. The goal is to control your pain so you can move easier and return to your normal activities as soon as possible after your procedure. Your physician may use several types of medicines to manage pain, swelling, and more.  Your surgeon or anesthesiologist gave you EXPAREL (bupivacaine ) to help control your pain after surgery.  EXPAREL  is a local anesthetic designed to release slowly over an  extended period of time to provide pain relief by numbing the tissue around the surgical site. EXPAREL  is designed to release pain medication over time and can control pain for up to 72 hours. Depending on how you respond to EXPAREL , you may require less pain medication during your recovery. EXPAREL  can help reduce or eliminate the need for opioids during the first few days after surgery when pain relief is needed the most. EXPAREL  is not an opioid and is not addictive. It does not cause sleepiness or sedation.   Important! A teal colored band has been placed on your arm with the date, time and amount of EXPAREL  you have received. Please leave this armband in place for the full 96 hours following administration, and then you may remove the band. If you return to the hospital for any reason within 96 hours following the administration of EXPAREL , the armband provides important information that your health care providers to know, and alerts them that you have received this anesthetic.    Possible side effects of EXPAREL : Temporary loss of sensation or ability to move in the area where medication was injected. Nausea, vomiting, constipation Rarely, numbness and tingling in your mouth or lips, lightheadedness, or anxiety may occur. Call your doctor right away if you think you may be experiencing any of these sensations, or if you have other questions regarding possible side effects.  Follow all other discharge instructions given to you by your surgeon or nurse. Eat a healthy diet and drink plenty of water  or other fluids.

## 2024-04-11 NOTE — Anesthesia Preprocedure Evaluation (Signed)
 "                                  Anesthesia Evaluation  Patient identified by MRN, date of birth, ID band Patient awake    Reviewed: Allergy & Precautions, NPO status , Patient's Chart, lab work & pertinent test results  History of Anesthesia Complications Negative for: history of anesthetic complications  Airway Mallampati: III  TM Distance: >3 FB Neck ROM: full    Dental  (+) Poor Dentition, Dental Advidsory Given   Pulmonary neg shortness of breath, asthma , sleep apnea , neg COPD, neg recent URI   Pulmonary exam normal        Cardiovascular hypertension, (-) angina + CAD and +CHF  (-) Past MI and (-) Cardiac Stents Normal cardiovascular exam(-) dysrhythmias + Valvular Problems/Murmurs   Echo 4/24 IMPRESSIONS     1. Left ventricular ejection fraction, by estimation, is 55 to 60%. The  left ventricle has normal function. Left ventricular endocardial border  not optimally defined to evaluate regional wall motion. There is mild left  ventricular hypertrophy. Left  ventricular diastolic parameters are consistent with Grade I diastolic  dysfunction (impaired relaxation).   2. Right ventricular systolic function is normal. The right ventricular  size is normal.   3. The mitral valve is normal in structure. Mild mitral valve  regurgitation. No evidence of mitral stenosis.   4. The aortic valve is normal in structure. Aortic valve regurgitation is  not visualized. Aortic valve sclerosis is present, with no evidence of  aortic valve stenosis.   5. The inferior vena cava is normal in size with greater than 50%  respiratory variability, suggesting right atrial pressure of 3 mmHg.     Neuro/Psych  PSYCHIATRIC DISORDERS Anxiety Depression     Neuromuscular disease    GI/Hepatic Neg liver ROS,GERD  ,,  Endo/Other  diabetesHypothyroidism    Renal/GU negative Renal ROS  negative genitourinary   Musculoskeletal   Abdominal   Peds  Hematology  (+) Blood  dyscrasia, anemia   Anesthesia Other Findings Past Medical History: No date: Allergy No date: Anxiety No date: CHF (congestive heart failure) (HCC) No date: Colon polyp No date: Depression No date: GERD (gastroesophageal reflux disease) No date: Heart murmur No date: Hyperlipidemia No date: Hypertension No date: Hypothyroidism No date: Sleep apnea No date: Thyroid  disease  Past Surgical History: No date: ABDOMINAL HYSTERECTOMY No date: BACK SURGERY No date: BACK SURGERY No date: CERVICAL FUSION 05/28/2017: COLONOSCOPY WITH PROPOFOL ; N/A     Comment:  Procedure: COLONOSCOPY WITH PROPOFOL ;  Surgeon: Therisa Bi, MD;  Location: [facility] ENDOSCOPY;  Service:               Gastroenterology;  Laterality: N/A; 11/17/2019: COLONOSCOPY WITH PROPOFOL ; N/A     Comment:  Procedure: COLONOSCOPY WITH PROPOFOL ;  Surgeon:               Maryruth Ole DASEN, MD;  Location: [facility] ENDOSCOPY;                Service: Endoscopy;  Laterality: N/A; 11/17/2019: ESOPHAGOGASTRODUODENOSCOPY (EGD) WITH PROPOFOL ; N/A     Comment:  Procedure: ESOPHAGOGASTRODUODENOSCOPY (EGD) WITH               PROPOFOL ;  Surgeon: Maryruth Ole DASEN, MD;  Location:  [facility] ENDOSCOPY;  Service: Endoscopy;  Laterality: N/A; 10/20/2022: IR KYPHO THORACIC WITH BONE BIOPSY 09/22/2022: IR RADIOLOGIST EVAL & MGMT No date: NECK SURGERY No date: THYROID  SURGERY No date: THYROID  SURGERY     Reproductive/Obstetrics negative OB ROS                              Anesthesia Physical Anesthesia Plan  ASA: 3  Anesthesia Plan: General   Post-op Pain Management: Regional block*   Induction: Intravenous  PONV Risk Score and Plan: 3 and Propofol  infusion and TIVA  Airway Management Planned: Oral ETT  Additional Equipment:   Intra-op Plan:   Post-operative Plan: Extubation in OR  Informed Consent: I have reviewed the patients History and Physical, chart, labs and discussed the  procedure including the risks, benefits and alternatives for the proposed anesthesia with the patient or authorized representative who has indicated his/her understanding and acceptance.     Dental Advisory Given  Plan Discussed with: Anesthesiologist, CRNA and Surgeon  Anesthesia Plan Comments: (Patient consented for risks of anesthesia including but not limited to:  - adverse reactions to medications - risk of airway placement if required - damage to eyes, teeth, lips or other oral mucosa - nerve damage due to positioning  - sore throat or hoarseness - Damage to heart, brain, nerves, lungs, other parts of body or loss of life  Patient voiced understanding and assent.)        Anesthesia Quick Evaluation  "

## 2024-04-11 NOTE — Anesthesia Procedure Notes (Signed)
 Procedure Name: Intubation Date/Time: 04/11/2024 8:00 AM  Performed by: Myra Lawless, CRNAPre-anesthesia Checklist: Patient identified, Patient being monitored, Timeout performed, Emergency Drugs available and Suction available Patient Re-evaluated:Patient Re-evaluated prior to induction Oxygen Delivery Method: Circle system utilized Preoxygenation: Pre-oxygenation with 100% oxygen Induction Type: IV induction Ventilation: Mask ventilation without difficulty Laryngoscope Size: Mac and 4 Grade View: Grade I Tube type: Oral Tube size: 7.0 mm Number of attempts: 1 Airway Equipment and Method: Stylet Placement Confirmation: ETT inserted through vocal cords under direct vision, positive ETCO2 and breath sounds checked- equal and bilateral Secured at: 21 cm Tube secured with: Tape Dental Injury: Teeth and Oropharynx as per pre-operative assessment

## 2024-04-11 NOTE — Anesthesia Postprocedure Evaluation (Signed)
"   Anesthesia Post Note  Patient: Helen Mccormick  Procedure(s) Performed: ARTHROPLASTY, SHOULDER, TOTAL, REVERSE (Right: Shoulder) TENODESIS, BICEPS (Right: Shoulder)  Patient location during evaluation: PACU Anesthesia Type: General Level of consciousness: awake and alert Pain management: pain level controlled Vital Signs Assessment: post-procedure vital signs reviewed and stable Respiratory status: spontaneous breathing, nonlabored ventilation, respiratory function stable and patient connected to nasal cannula oxygen Cardiovascular status: blood pressure returned to baseline and stable Postop Assessment: no apparent nausea or vomiting Anesthetic complications: no   No notable events documented.   Last Vitals:  Vitals:   04/11/24 1323 04/11/24 1357  BP: (!) 142/63 (!) 149/65  Pulse: (!) 53   Resp:  16  Temp:    SpO2: 96% 98%    Last Pain:  Vitals:   04/11/24 1357  TempSrc:   PainSc: 0-No pain                 Prentice Murphy      "

## 2024-04-11 NOTE — Evaluation (Signed)
 Occupational Therapy Evaluation Patient Details Name: Helen Mccormick MRN: [id number] DOB: 09/17/1940 Today's Date: 04/11/2024   History of Present Illness   Helen Mccormick is an 84 yo F with PMH: HTN, CAD, CHF, and DM. S/p R RSA with biceps tenodesis on 04/11/24     Clinical Impressions Helen Mccormick was seen for OT evaluation this date. Prior to hospital admission, pt was IND. Pt lives with grand daughter. Pt currently requires CGA toilet t/f, assist due to recent anesthesia. SUPERVISION pericare sitting. CGA + L rail for x4 stairs navigation, completed 2 trials. MAX A don/doff sling, polar care, and shirt in sitting. Pt instructed in polar care mgt, compression stockings mgt, sling/immobilizer mgt, ROM exercises for RUE (with instructions for no shoulder exercises until full sensation has returned), RUE precautions, adaptive strategies for dressing and sleep. Handout provided. All education complete, will sign off. Upon hospital discharge, recommend no OT follow up.    If plan is discharge home, recommend the following:   A little help with walking and/or transfers     Functional Status Assessment   Patient has had a recent decline in their functional status and demonstrates the ability to make significant improvements in function in a reasonable and predictable amount of time.     Equipment Recommendations         Recommendations for Other Services         Precautions/Restrictions   Precautions Precautions: Shoulder Shoulder Interventions: Shoulder sling/immobilizer;Shoulder abduction pillow;Off for dressing/bathing/exercises Precaution Booklet Issued: Yes (comment) Recall of Precautions/Restrictions: Intact Required Braces or Orthoses: Sling Restrictions Weight Bearing Restrictions Per Provider Order: Yes RUE Weight Bearing Per Provider Order: Non weight bearing     Mobility Bed Mobility               General bed mobility comments: not tested     Transfers Overall transfer level: Needs assistance Equipment used: None Transfers: Sit to/from Stand Sit to Stand: Contact guard assist                  Balance Overall balance assessment: No apparent balance deficits (not formally assessed)                                         ADL either performed or assessed with clinical judgement   ADL Overall ADL's : Needs assistance/impaired                                       General ADL Comments: CGA toilet t/f, assist due to recent anesthesia. SUPERVISION pericare sitting. CGA + L rail for x4 stairs navigation, completed 2 trials. MAX A don/doff sling, polar care, and shirt in sitting.     Vision         Perception         Praxis         Pertinent Vitals/Pain Pain Assessment Pain Assessment: No/denies pain     Extremity/Trunk Assessment Upper Extremity Assessment Upper Extremity Assessment: Right hand dominant;RUE deficits/detail RUE: Unable to fully assess due to immobilization   Lower Extremity Assessment Lower Extremity Assessment: Overall WFL for tasks assessed       Communication Communication Communication: No apparent difficulties   Cognition Arousal: Alert Behavior During Therapy: WFL for tasks assessed/performed Cognition: No apparent impairments  Following commands: Intact       Cueing  General Comments   Cueing Techniques: Verbal cues      Exercises     Shoulder Instructions      Home Living Family/patient expects to be discharged to:: Private residence Living Arrangements: Other relatives Available Help at Discharge: Family;Available 24 hours/day Type of Home: Apartment Home Access: Stairs to enter Entrance Stairs-Number of Steps: 3 Entrance Stairs-Rails: Left Home Layout: One level               Home Equipment: None          Prior Functioning/Environment Prior Level of Function :  Independent/Modified Independent                    OT Problem List: Decreased range of motion   OT Treatment/Interventions:        OT Goals(Current goals can be found in the care plan section)   Acute Rehab OT Goals Patient Stated Goal: home OT Goal Formulation: With patient Time For Goal Achievement: 04/11/24 Potential to Achieve Goals: Good   OT Frequency:       Co-evaluation              AM-PAC OT 6 Clicks Daily Activity     Outcome Measure Help from another person eating meals?: None Help from another person taking care of personal grooming?: None Help from another person toileting, which includes using toliet, bedpan, or urinal?: None Help from another person bathing (including washing, rinsing, drying)?: A Little Help from another person to put on and taking off regular upper body clothing?: A Lot Help from another person to put on and taking off regular lower body clothing?: A Lot 6 Click Score: 19   End of Session Nurse Communication: Mobility status  Activity Tolerance: Patient tolerated treatment well Patient left: in chair;with call bell/phone within reach;with family/visitor present  OT Visit Diagnosis: Unsteadiness on feet (R26.81)                Time: 8765-8690 OT Time Calculation (min): 35 min Charges:  OT General Charges $OT Visit: 1 Visit OT Evaluation $OT Eval Low Complexity: 1 Low OT Treatments $Self Care/Home Management : 23-37 mins  Elston Slot, M.S. OTR/L  04/11/2024, 4:43 PM  ascom [phone]

## 2024-04-11 NOTE — H&P (Signed)
 Paper H&P to be scanned into permanent record. H&P reviewed. No significant changes noted.

## 2024-04-11 NOTE — Op Note (Signed)
 SURGERY DATE: 04/11/2024   PRE-OP DIAGNOSIS:  1. Right shoulder rotator cuff arthropathy   POST-OP DIAGNOSIS:  1. Right shoulder rotator cuff arthropathy   PROCEDURES:  1. Right reverse total shoulder arthroplasty 2. Right biceps tenodesis   SURGEON: Earnestine HILARIO Blanch, MD  ASSISTANTS: Krystal Doyne, PA   ANESTHESIA: Gen + interscalene block   ESTIMATED BLOOD LOSS: 100cc   TOTAL IV FLUIDS: per anesthesia record  IMPLANTS: [employer]: RSP Glenoid Head w/Retaining screw 32-4; Monoblock Reverse Shoulder Baseplate with 6.40mm central screw; 3 locking screws into baseplate (superior, posterior, inferior); Small Shell Short Humeral Stem 10 x 48mm; Neutral Small Socket Insert;    INDICATION(S):  Helen Mccormick is a 84 y.o. female with chronic shoulder pain with inability to lift arm overhead. Imaging consistent with massive, irreparable rotator cuff tear. Conservative measures including medications and cortisone injections have not provided adequate relief. After discussion of risks, benefits, and alternatives to surgery, the patient elected to proceed with reverse shoulder arthroplasty and biceps tenodesis.   OPERATIVE FINDINGS: rotator cuff tear (complete supraspinatus, partial infraspinatus); severe biceps tendinopathy   OPERATIVE REPORT:   I identified Helen Mccormick in the pre-operative holding area. Informed consent was obtained and the surgical site was marked. I reviewed the risks and benefits of the proposed surgical intervention and the patient wished to proceed. An interscalene block with Exparel  was administered by the Anesthesia team. The patient was transferred to the operative suite and general anesthesia was administered. The patient was placed in the beach chair position with the head of the bed elevated approximately 45 degrees. All down side pressure points were appropriately padded. Pre-op exam under anesthesia confirmed some stiffness and crepitus. Appropriate IV  antibiotics were administered. The extremity was then prepped and draped in standard fashion. A time out was performed confirming the correct extremity, correct patient, and correct procedure.   We used the standard deltopectoral incision from the coracoid to ~12cm distal. We found the cephalic vein and took it laterally. We opened the deltopectoral interval widely and placed retractors under the CA ligament in the subacromial space and under the deltoid tendon at its insertion. We then abducted and internally rotated the arm and released the underlying bursa between these retractors, taking care not to damage the circumflex branch of the axillary nerve.   Next, we brought the arm back in adduction at slight forward flexion with external rotation. We opened the clavipectoral fascia lateral to the conjoint tendon. We gently palpated the axillary nerve and verified its position and continuity on both sides of the humerus with a Tug test. This test was repeated multiple times during the procedure for nerve localization and confirmed to be intact at the end of the case. We then cauterized the anterior humeral circumflex (Three sisters) vessels. The arm was then internally rotated, we cut the falciform ligament at approximately 1 cm of the upper portion of the pectoralis major insertion. Next we unroofed the bicipital groove. We proceeded with a soft tissue biceps tenodesis given the pathology (significant thickening and tendinopathy proximally) of the tendon.  After opening the biceps tendon sheath all the way to the supraglenoid tubercle, we performed a biceps tenodesis with two #2 TiCron sutures to the upper border of the pectoralis major. The proximal portion of the tendon was excised.   At this point, we could see that the supraspinatus and anterior infraspinatus were completely torn with a bald humeral head superiorly. The subscapularis was intact. We performed a subscapularis peel  using electrocautery to  remove the anterior capsule and subscapularis off of the humeral head. We released the inferior capsule from the humerus all the way to the posterior band of the inferior glenohumeral ligament. When this was complete we gently dislocated the shoulder up into the wound. We removed any osteophytes and made our cut with the appropriate inclination in 20 degrees of retroversion  We then turned our attention back to the glenoid. The proximal humerus was retracted posteriorly. The anterior capsule was dissected free from the subscapularis. The anterior capsule was then excised, exposing the anterior glenoid. We then grasped the labrum and removed it circumferentially. During the glenoid exposure, the axillary nerve was protected the entire time.    A patient-specific guide was used to drill the central guidepin. An appropriately sized reamer was used to ream the glenoid. A cannulated tap was placed over the guidepin. The monoblock baseplate was inserted and excellent fixation was achieved such that the entire scapula rotated with further attempted seating of the baseplate. The 3 peripheral screws were drilled, measured, and placed. The wound was thoroughly irrigated. The glenosphere was then placed and tightened.   We then turned our attention back to the humerus. We sized for a small shell prosthesis. We sequentially used larger diameter canal finders until we met appropriate resistance and sequential broaching was performed to this size listed above. Trial poly inserts were placed. The humerus was trialed and noted to have satisfactory stability, motion, and deltoid tension with above listed poly. The trial implants were removed. 3 drill holes were placed about the lesser tuberosity footprint and FiberWire sutures were passed through these holes for subscapularis repair. Next, the implant was placed with the appropriate retroversion. Stability was confirmed. We placed the actual poly insert. The humerus was  reduced and motion, tension, and stability were satisfactory. A Hemovac drain was placed. The wound was thoroughly irrigated. Subscapularis was repaired with the previously passed #2 FiberWire sutures after passing them through the subscapularis.   We again verified the tension on the axillary nerve, appropriate range of motion, stability of the implant, and security of the subscapularis repair. We closed the deltopectoral interval deep to the cephalic vein with a running, 0-Vicryl suture. The skin was closed with 2-0 Vicryl and 4-0 Monocryl. Xeroform and Honeycomb dressing was applied. A PolarCare unit and sling were placed. Patient was extubated, transferred to a stretcher bed and to the post anesthesia care unit in stable condition.   Of note, assistance from a PA was essential to performing the surgery.  PA was present for the entire surgery.  PA assisted with patient positioning, retraction, instrumentation, and wound closure. The surgery would have been more difficult and had longer operative time without PA assistance.    POSTOPERATIVE PLAN: The patient will be discharged home from the PACU. Operative arm to remain in sling at all times except RoM exercises and hygiene. Can perform pendulums, elbow/wrist/hand RoM exercises. Passive RoM allowed to 90 FF and 30 ER. ASA 325mg  x 6 weeks for DVT ppx. Plan for PT starting on POD #3-4. Patient to return to clinic in ~2 weeks for post-operative appointment.

## 2024-04-11 NOTE — Progress Notes (Signed)
 Hemovac drain removed with no complications.  Stable for discharge home.

## 2024-04-11 NOTE — Anesthesia Procedure Notes (Addendum)
 Anesthesia Regional Block: Interscalene brachial plexus block   Pre-Anesthetic Checklist: , timeout performed,  Correct Patient, Correct Site, Correct Laterality,  Correct Procedure, Correct Position, site marked,  Risks and benefits discussed,  Surgical consent,  Pre-op evaluation,  At surgeon's request and post-op pain management  Laterality: Right and Upper  Prep: chloraprep       Needles:  Injection technique: Single-shot  Needle Type: Stimiplex     Needle Length: 5cm  Needle Gauge: 22     Additional Needles:   Procedures:,,,, ultrasound used (permanent image in chart),,    Narrative:  Start time: 04/11/2024 7:43 AM End time: 04/11/2024 7:46 AM Injection made incrementally with aspirations every 5 mL.  Performed by: Personally  Anesthesiologist: Dario Barter, MD  Additional Notes: Functioning IV was confirmed and monitors were applied.  A 50mm 22ga Stimuplex needle was used. Sterile prep and drape,hand hygiene and sterile gloves were used.  Negative aspiration and negative test dose prior to incremental administration of local anesthetic. The patient tolerated the procedure well.

## 2024-04-12 ENCOUNTER — Encounter: Payer: Self-pay | Admitting: Orthopedic Surgery

## 2024-04-14 ENCOUNTER — Other Ambulatory Visit: Payer: Self-pay | Admitting: Internal Medicine

## 2024-04-14 MED ORDER — ISOSORBIDE DINITRATE 30 MG PO TABS: 30.0000 mg | ORAL_TABLET | Freq: Every day | ORAL | 3 refills | Status: AC

## 2024-04-18 MED ORDER — SACUBITRIL-VALSARTAN 97-103 MG PO TABS: 1.0000 | ORAL_TABLET | Freq: Two times a day (BID) | ORAL | 11 refills | Status: AC

## 2024-04-25 ENCOUNTER — Other Ambulatory Visit: Payer: Self-pay | Admitting: Internal Medicine

## 2024-04-25 DIAGNOSIS — J301 Allergic rhinitis due to pollen: Secondary | ICD-10-CM

## 2024-05-11 ENCOUNTER — Ambulatory Visit: Admitting: Internal Medicine
